# Patient Record
Sex: Male | Born: 1945
Health system: Southern US, Community
[De-identification: ages and names within clinical notes are randomized; demographics above are authoritative.]

## PROBLEM LIST (undated history)

## (undated) DIAGNOSIS — E785 Hyperlipidemia, unspecified: Secondary | ICD-10-CM

## (undated) DIAGNOSIS — E119 Type 2 diabetes mellitus without complications: Secondary | ICD-10-CM

## (undated) DIAGNOSIS — I739 Peripheral vascular disease, unspecified: Secondary | ICD-10-CM

## (undated) DIAGNOSIS — N401 Enlarged prostate with lower urinary tract symptoms: Principal | ICD-10-CM

## (undated) DIAGNOSIS — I10 Essential (primary) hypertension: Secondary | ICD-10-CM

## (undated) DIAGNOSIS — E559 Vitamin D deficiency, unspecified: Secondary | ICD-10-CM

## (undated) DIAGNOSIS — N138 Other obstructive and reflux uropathy: Secondary | ICD-10-CM

## (undated) DIAGNOSIS — Z87442 Personal history of urinary calculi: Secondary | ICD-10-CM

## (undated) DIAGNOSIS — G4733 Obstructive sleep apnea (adult) (pediatric): Secondary | ICD-10-CM

## (undated) DIAGNOSIS — N189 Chronic kidney disease, unspecified: Secondary | ICD-10-CM

## (undated) DIAGNOSIS — N200 Calculus of kidney: Secondary | ICD-10-CM

## (undated) HISTORY — PX: COLONOSCOPY: SHX174

## (undated) HISTORY — DX: Type 2 diabetes mellitus without complications: E11.9

## (undated) HISTORY — PX: NECK SURGERY: SHX720

## (undated) HISTORY — DX: Other obstructive and reflux uropathy: N13.8

## (undated) HISTORY — DX: Benign prostatic hyperplasia with lower urinary tract symptoms: N40.1

## (undated) HISTORY — DX: Hyperlipidemia, unspecified: E78.5

## (undated) HISTORY — DX: Calculus of kidney: N20.0

## (undated) HISTORY — DX: Peripheral vascular disease, unspecified: I73.9

## (undated) HISTORY — DX: Vitamin D deficiency, unspecified: E55.9

## (undated) HISTORY — DX: Obstructive sleep apnea (adult) (pediatric): G47.33

## (undated) HISTORY — PX: DG ARTHRO THUMB*L*: HXRAD205

## (undated) HISTORY — PX: VASECTOMY: SHX75

---

## 2000-05-20 ENCOUNTER — Encounter: Payer: Self-pay | Admitting: Emergency Medicine

## 2000-05-20 ENCOUNTER — Emergency Department (HOSPITAL_COMMUNITY): Admission: EM | Admit: 2000-05-20 | Discharge: 2000-05-20 | Payer: Self-pay | Admitting: Emergency Medicine

## 2000-05-24 ENCOUNTER — Encounter: Payer: Self-pay | Admitting: *Deleted

## 2000-05-24 ENCOUNTER — Encounter: Admission: RE | Admit: 2000-05-24 | Discharge: 2000-05-24 | Payer: Self-pay | Admitting: *Deleted

## 2002-10-07 ENCOUNTER — Encounter: Admission: RE | Admit: 2002-10-07 | Discharge: 2002-10-07 | Payer: Self-pay | Admitting: Family Medicine

## 2002-10-07 ENCOUNTER — Encounter: Payer: Self-pay | Admitting: Family Medicine

## 2002-10-21 ENCOUNTER — Encounter: Admission: RE | Admit: 2002-10-21 | Discharge: 2003-01-19 | Payer: Self-pay | Admitting: Family Medicine

## 2002-10-27 ENCOUNTER — Encounter: Admission: RE | Admit: 2002-10-27 | Discharge: 2002-10-27 | Payer: Self-pay | Admitting: Family Medicine

## 2002-10-27 ENCOUNTER — Encounter: Payer: Self-pay | Admitting: Family Medicine

## 2002-12-26 ENCOUNTER — Encounter: Payer: Self-pay | Admitting: Neurosurgery

## 2002-12-31 ENCOUNTER — Encounter: Payer: Self-pay | Admitting: Neurosurgery

## 2002-12-31 ENCOUNTER — Ambulatory Visit (HOSPITAL_COMMUNITY): Admission: RE | Admit: 2002-12-31 | Discharge: 2003-01-01 | Payer: Self-pay | Admitting: Neurosurgery

## 2003-02-11 ENCOUNTER — Encounter: Payer: Self-pay | Admitting: Neurosurgery

## 2003-02-11 ENCOUNTER — Encounter: Admission: RE | Admit: 2003-02-11 | Discharge: 2003-02-11 | Payer: Self-pay | Admitting: Neurosurgery

## 2003-02-25 ENCOUNTER — Encounter: Admission: RE | Admit: 2003-02-25 | Discharge: 2003-05-26 | Payer: Self-pay | Admitting: Family Medicine

## 2003-05-02 ENCOUNTER — Ambulatory Visit (HOSPITAL_COMMUNITY): Admission: RE | Admit: 2003-05-02 | Discharge: 2003-05-02 | Payer: Self-pay | Admitting: Gastroenterology

## 2003-05-29 ENCOUNTER — Encounter: Admission: RE | Admit: 2003-05-29 | Discharge: 2003-05-29 | Payer: Self-pay | Admitting: Neurosurgery

## 2003-05-29 ENCOUNTER — Encounter: Payer: Self-pay | Admitting: Neurosurgery

## 2003-12-04 ENCOUNTER — Encounter: Admission: RE | Admit: 2003-12-04 | Discharge: 2003-12-04 | Payer: Self-pay | Admitting: Neurosurgery

## 2004-03-09 ENCOUNTER — Encounter (HOSPITAL_BASED_OUTPATIENT_CLINIC_OR_DEPARTMENT_OTHER): Admission: RE | Admit: 2004-03-09 | Discharge: 2004-06-07 | Payer: Self-pay | Admitting: Internal Medicine

## 2011-10-21 ENCOUNTER — Ambulatory Visit: Payer: Self-pay | Admitting: Internal Medicine

## 2011-11-18 ENCOUNTER — Ambulatory Visit: Payer: Self-pay | Admitting: Internal Medicine

## 2013-02-01 ENCOUNTER — Encounter: Payer: Self-pay | Admitting: Family Medicine

## 2013-02-01 ENCOUNTER — Ambulatory Visit (INDEPENDENT_AMBULATORY_CARE_PROVIDER_SITE_OTHER): Payer: Self-pay | Admitting: Family Medicine

## 2013-02-01 VITALS — BP 122/77 | HR 69 | Temp 98.4°F | Resp 18 | Ht 61.0 in | Wt 133.0 lb

## 2013-02-01 DIAGNOSIS — E785 Hyperlipidemia, unspecified: Secondary | ICD-10-CM | POA: Insufficient documentation

## 2013-02-01 DIAGNOSIS — E559 Vitamin D deficiency, unspecified: Secondary | ICD-10-CM | POA: Insufficient documentation

## 2013-02-01 DIAGNOSIS — I10 Essential (primary) hypertension: Secondary | ICD-10-CM | POA: Insufficient documentation

## 2013-02-01 DIAGNOSIS — K047 Periapical abscess without sinus: Secondary | ICD-10-CM

## 2013-02-01 DIAGNOSIS — K044 Acute apical periodontitis of pulpal origin: Secondary | ICD-10-CM

## 2013-02-01 LAB — CBC WITH DIFFERENTIAL/PLATELET
Basophils Absolute: 0 10*3/uL (ref 0.0–0.1)
Basophils Relative: 0 % (ref 0–1)
Hemoglobin: 14.1 g/dL (ref 13.0–17.0)
MCHC: 33.3 g/dL (ref 30.0–36.0)
Neutrophils Relative %: 66 % (ref 43–77)
RBC: 4.96 MIL/uL (ref 4.22–5.81)

## 2013-02-01 MED ORDER — CLINDAMYCIN HCL 300 MG PO CAPS: 300.0000 mg | ORAL_CAPSULE | Freq: Three times a day (TID) | ORAL | Status: DC

## 2013-02-01 MED ORDER — CEFTRIAXONE SODIUM 1 G IJ SOLR
1.0000 g | Freq: Once | INTRAMUSCULAR | Status: AC
  Administered 2013-02-01: 1 g via INTRAMUSCULAR

## 2013-02-01 MED ORDER — AMOXICILLIN-POT CLAVULANATE 500-125 MG PO TABS: 1.0000 | ORAL_TABLET | Freq: Three times a day (TID) | ORAL | Status: DC

## 2013-02-01 NOTE — Progress Notes (Signed)
CC: Michael Thomas is a 67 y.o. male is here for Establish Care and Fatigue   Subjective: HPI:  Very pleasant 67 year old here to establish care accompanied by his wife  Patient complains of 10 days of worsening subjective fevers, chills, and pain. The pain is localized in the anterior and posterior neck moderate in severity it radiates up into his face and the back of his head, it is worse with swallowing. Nothing else particularly makes it better or worse. This all started one day after a tooth was pulled in [place]. He has been taking amoxicillin for approximately 5 days without any improvement of his symptoms. He was given meloxicam for me urgent care provider earlier this week but no improvement of symptoms. He feels fatigued has decreased appetite.  Symptoms are present on a daily basis slowly worsening.  He denies skin pain, shortness of breath, cough, wheezing, dysuria, constipation or diarrhea. He reports an occasional discomfort that is mild low in his abdomen that he's had off and on for months but otherwise is without any other complaints. He has had decreased appetite but denies nausea.  Review of Systems - General ROS: negative for - weight gain or weight loss Ophthalmic ROS: negative for - decreased vision Psychological ROS: negative for - anxiety or depression ENT ROS: negative for - hearing change, nasal congestion, tinnitus or allergies Hematological and Lymphatic ROS: negative for - bleeding problems, bruising or swollen lymph nodes Breast ROS: negative Respiratory ROS: no cough, shortness of breath, or wheezing Cardiovascular ROS: no chest pain or dyspnea on exertion Gastrointestinal ROS: no abdominal pain, change in bowel habits, or black or bloody stools Genito-Urinary ROS: negative for - genital discharge, genital ulcers, incontinence or abnormal bleeding from genitals Musculoskeletal ROS: negative for - joint pain or muscle pain other than that above Neurological ROS:  negative for - headaches or memory loss Dermatological ROS: negative for lumps, mole changes, rash and skin lesion changes  Past Medical History  Diagnosis Date  . Diabetes mellitus without complication   . Hyperlipidemia      History reviewed. No pertinent family history.   History  Substance Use Topics  . Smoking status: Never Smoker   . Smokeless tobacco: Not on file  . Alcohol Use: No     Objective: Filed Vitals:   02/01/13 1113  BP: 122/77  Pulse: 69  Temp: 98.4 F (36.9 C)  Resp: 18    General: Alert and Oriented, No Acute Distress HEENT: Pupils equal, round, reactive to light. Conjunctivae clear.  External ears unremarkable, canals clear with intact TMs with appropriate landmarks.  Middle ear appears open without effusion. Pink inferior turbinates.  Moist mucous membranes, pharynx without inflammation nor lesions.  Neck with shotty anterior bilateral cervical lymphadenopathy. Adjacent to the right upper canine tooth there is a missing tooth with a base that has mild purulence and slight tenderness to the touch Lungs: Clear to auscultation bilaterally, no wheezing/ronchi/rales.  Comfortable work of breathing. Good air movement. Cardiac: Regular rate and rhythm. Normal S1/S2.  No murmurs, rubs, nor gallops.   Abdomen: Normal bowel sounds, no palpable masses, mild right lower quadrant pain without rebound negative psoas negative heel strike. Extremities: No peripheral edema.  Strong peripheral pulses.  Mental Status: No depression, anxiety, nor agitation. Skin: Warm and dry.  Assessment & Plan: [place] was seen today for establish care and fatigue.  Diagnoses and associated orders for this visit:  Dental infection - clindamycin (CLEOCIN) 300 MG capsule; Take 1 capsule (300 mg  total) by mouth 3 (three) times daily. - amoxicillin-clavulanate (AUGMENTIN) 500-125 MG per tablet; Take 1 tablet (500 mg total) by mouth 3 (three) times daily. - CBC w/Diff - cefTRIAXone  (ROCEPHIN) injection 1 g; Inject 1 g into the muscle once.  Other Orders - meloxicam (MOBIC) 15 MG tablet; Take 15 mg by mouth daily. - metFORMIN (GLUCOPHAGE) 500 MG tablet; Take 500 mg by mouth 3 (three) times daily. - lisinopril (PRINIVIL,ZESTRIL) 10 MG tablet; Take 10 mg by mouth daily. - simvastatin (ZOCOR) 40 MG tablet; Take 40 mg by mouth every evening. - Magnesium 400 MG CAPS; Take by mouth. - aspirin 81 MG tablet; Take 81 mg by mouth daily. - fish oil-omega-3 fatty acids 1000 MG capsule; Take 2 g by mouth daily. - cholecalciferol (VITAMIN D) 1000 UNITS tablet; Take 1,000 Units by mouth daily.    Discussed with patient and wife that I believe his symptoms are coming from a dental infection at the site of his tooth that was removed, I would like to better cover oral bacteria with starting Augmentin since he has been on amoxicillin I also like him to start clindamycin in case of increased resistance to amoxicillin. He received a gram of ceftriaxone in hopes that this will speed up the recovery process. Discussed that there is a chance that his fevers could be due to appendicitis however he would not like to investigate this citing that his right lower quadrant pain is subjectively some mild. I would like to get a white count as a baseline should symptoms be lingering on Monday. Signs and symptoms requring emergent/urgent reevaluation were discussed with the patient.   Return if symptoms worsen or fail to improve.

## 2013-02-18 ENCOUNTER — Ambulatory Visit: Payer: Self-pay | Admitting: Family Medicine

## 2013-04-11 ENCOUNTER — Ambulatory Visit: Payer: Self-pay | Admitting: Unknown Physician Specialty

## 2013-06-07 ENCOUNTER — Ambulatory Visit: Payer: Self-pay | Admitting: Gastroenterology

## 2014-01-24 DIAGNOSIS — E1129 Type 2 diabetes mellitus with other diabetic kidney complication: Secondary | ICD-10-CM | POA: Insufficient documentation

## 2014-01-24 DIAGNOSIS — G4733 Obstructive sleep apnea (adult) (pediatric): Secondary | ICD-10-CM | POA: Insufficient documentation

## 2014-01-24 DIAGNOSIS — E1165 Type 2 diabetes mellitus with hyperglycemia: Secondary | ICD-10-CM | POA: Insufficient documentation

## 2014-01-24 DIAGNOSIS — E1122 Type 2 diabetes mellitus with diabetic chronic kidney disease: Secondary | ICD-10-CM | POA: Insufficient documentation

## 2014-01-24 DIAGNOSIS — E119 Type 2 diabetes mellitus without complications: Secondary | ICD-10-CM | POA: Insufficient documentation

## 2014-01-24 DIAGNOSIS — E1151 Type 2 diabetes mellitus with diabetic peripheral angiopathy without gangrene: Secondary | ICD-10-CM | POA: Insufficient documentation

## 2014-06-13 ENCOUNTER — Emergency Department: Payer: Self-pay | Admitting: Emergency Medicine

## 2014-10-28 ENCOUNTER — Ambulatory Visit: Payer: Self-pay | Admitting: Family Medicine

## 2015-03-25 DIAGNOSIS — N138 Other obstructive and reflux uropathy: Secondary | ICD-10-CM | POA: Insufficient documentation

## 2015-03-25 DIAGNOSIS — N400 Enlarged prostate without lower urinary tract symptoms: Secondary | ICD-10-CM | POA: Insufficient documentation

## 2015-03-25 DIAGNOSIS — N401 Enlarged prostate with lower urinary tract symptoms: Secondary | ICD-10-CM

## 2015-03-25 HISTORY — DX: Other obstructive and reflux uropathy: N13.8

## 2015-04-03 ENCOUNTER — Encounter: Payer: Self-pay | Admitting: *Deleted

## 2015-04-14 ENCOUNTER — Ambulatory Visit: Payer: Self-pay

## 2015-04-24 ENCOUNTER — Ambulatory Visit: Payer: Self-pay

## 2015-04-30 ENCOUNTER — Ambulatory Visit (INDEPENDENT_AMBULATORY_CARE_PROVIDER_SITE_OTHER): Payer: PPO | Admitting: Urology

## 2015-04-30 VITALS — BP 137/76 | HR 66 | Ht 61.0 in | Wt 136.4 lb

## 2015-04-30 DIAGNOSIS — N401 Enlarged prostate with lower urinary tract symptoms: Secondary | ICD-10-CM

## 2015-04-30 DIAGNOSIS — N138 Other obstructive and reflux uropathy: Secondary | ICD-10-CM

## 2015-04-30 LAB — MICROSCOPIC EXAMINATION
RBC, UA: NONE SEEN /hpf (ref 0–?)
Renal Epithel, UA: NONE SEEN /hpf

## 2015-04-30 LAB — URINALYSIS, COMPLETE
Bilirubin, UA: NEGATIVE
GLUCOSE, UA: NEGATIVE
KETONES UA: NEGATIVE
Leukocytes, UA: NEGATIVE
NITRITE UA: NEGATIVE
RBC UA: NEGATIVE
SPEC GRAV UA: 1.025 (ref 1.005–1.030)
UUROB: 0.2 mg/dL (ref 0.2–1.0)
pH, UA: 5.5 (ref 5.0–7.5)

## 2015-04-30 LAB — BLADDER SCAN AMB NON-IMAGING: SCAN RESULT: 62

## 2015-04-30 MED ORDER — TAMSULOSIN HCL 0.4 MG PO CAPS: 0.4000 mg | ORAL_CAPSULE | Freq: Every day | ORAL | Status: AC

## 2015-04-30 MED ORDER — TAMSULOSIN HCL 0.4 MG PO CAPS: 0.4000 mg | ORAL_CAPSULE | Freq: Every day | ORAL | Status: DC

## 2015-04-30 NOTE — Progress Notes (Signed)
69 year old male who is referred by Dr. Lisette Grinder, M.D. for evaluation and management of progressive lower urinary tract symptoms. The patient states that his symptoms have been progressive over the past 8-10 months. Predominantly, the patient complains of urinary frequency and urgency. The patient does not have a history of urinary incontinence. He feels as if he empties his bladder completely most of the time. He does not have to strain to void, he denies a weak stream. He gets up on average less than 1 time per night. Occasionally, he will get up twice at night. The patient has no family history of prostate cancer. He has had routine rectal exams although he is unsure of his PSA results. The patient has no history of recurrent urinary tract infections. He denies any flank pain or suprapubic pain. He denies any dysuria. I PSS: 11, QoL3 PVR: 58 mL A comprehensive review of systems was obtained and is negative with the exception of sinus problems area did  The patient takes metformin but has well-controlled diabetes. He also has a history of hypercholesterolemia.  Past Medical History  Diagnosis Date   Diabetes mellitus without complication    Hyperlipidemia    Kidney stone    OSA (obstructive sleep apnea)    Vitamin D deficiency    Benign prostatic hyperplasia with urinary obstruction 03/25/2015   Past Surgical History  Procedure Laterality Date   Neck surgery     Dg arthro thumb*l*     Current Outpatient Prescriptions on File Prior to Visit  Medication Sig Dispense Refill   lisinopril (PRINIVIL,ZESTRIL) 10 MG tablet Take 10 mg by mouth daily.     metFORMIN (GLUCOPHAGE) 500 MG tablet Take 500 mg by mouth 3 (three) times daily.     simvastatin (ZOCOR) 40 MG tablet Take 40 mg by mouth every evening.     amoxicillin-clavulanate (AUGMENTIN) 500-125 MG per tablet Take 1 tablet (500 mg total) by mouth 3 (three) times daily. (Patient not taking: Reported on 04/30/2015) 30 tablet 0    cholecalciferol (VITAMIN D) 1000 UNITS tablet Take 1,000 Units by mouth daily.     clindamycin (CLEOCIN) 300 MG capsule Take 1 capsule (300 mg total) by mouth 3 (three) times daily. (Patient not taking: Reported on 04/30/2015) 30 capsule 0   fish oil-omega-3 fatty acids 1000 MG capsule Take 2 g by mouth daily.     Magnesium 400 MG CAPS Take by mouth.     meloxicam (MOBIC) 15 MG tablet Take 15 mg by mouth daily.     No current facility-administered medications on file prior to visit.   PE: NAD Filed Vitals:   04/30/15 1431  BP: 137/76  Pulse: 66   Mucosal membranes are moist Head is normocephalic, hearing normal nonlabored breathing Patient's extremities are well-perfused, no peripheral edema Her abdomen is flat There is no CVA tenderness There are no skin lesions or rashes  Rectal exam reveals a prostate that is +1, smooth, symmetric Mood is appropriate, affect is bright No appreciable lymphadenopathy Gait is normal, no gross neurological deficits  Bladder Scan Patient  Void: 58 ml Performed By: [facility]  Urinalysis today is normal  PSA, June 2015: 3.01  Impression: The patient has an enlarged prostate with obstructive voiding symptoms. His PSA was mildly elevated last year, his rectal exam is reassuring. Recommendations: I recommended that the patient continued to take tamsulosin as he is been prescribed. This seems to make his symptoms better. We will check a PSA today to ensure that the PSA  is not rising too fast. We'll plan to follow up with the patient in 1 year to reevaluate the patient's voiding symptoms at that time.  Cc: Dr. Lisette Grinder, M.D.

## 2015-05-01 LAB — PSA TOTAL (REFLEX TO FREE): Prostate Specific Ag, Serum: 2.7 ng/mL (ref 0.0–4.0)

## 2015-05-06 ENCOUNTER — Telehealth: Payer: Self-pay

## 2015-05-06 DIAGNOSIS — R972 Elevated prostate specific antigen [PSA]: Secondary | ICD-10-CM

## 2015-05-06 NOTE — Telephone Encounter (Signed)
-----   Message from Ardis Hughs, MD sent at 05/06/2015  8:24 AM EDT ----- Please inform patient of his PSA result, this is stable from the year prior and doesn't need to be checked again until next year.

## 2015-05-06 NOTE — Telephone Encounter (Signed)
Spoke pt in reference to PSA results. Pt voiced understanding. Pt made a lab appt for 04/28/16 for PSA prior to appt. PSA orders placed.

## 2015-09-23 DIAGNOSIS — E119 Type 2 diabetes mellitus without complications: Secondary | ICD-10-CM | POA: Diagnosis not present

## 2015-09-30 DIAGNOSIS — I10 Essential (primary) hypertension: Secondary | ICD-10-CM | POA: Diagnosis not present

## 2015-09-30 DIAGNOSIS — E119 Type 2 diabetes mellitus without complications: Secondary | ICD-10-CM | POA: Diagnosis not present

## 2015-11-17 ENCOUNTER — Ambulatory Visit (INDEPENDENT_AMBULATORY_CARE_PROVIDER_SITE_OTHER): Payer: PPO | Admitting: Family Medicine

## 2015-11-17 ENCOUNTER — Encounter: Payer: Self-pay | Admitting: Family Medicine

## 2015-11-17 VITALS — BP 128/81 | HR 90 | Wt 134.0 lb

## 2015-11-17 DIAGNOSIS — J302 Other seasonal allergic rhinitis: Secondary | ICD-10-CM | POA: Diagnosis not present

## 2015-11-17 DIAGNOSIS — R252 Cramp and spasm: Secondary | ICD-10-CM

## 2015-11-17 NOTE — Progress Notes (Signed)
CC: Michael Thomas is a 70 y.o. male is here for URI   Subjective: HPI:  Very pleasant 70 year old here to reestablish care   2 days ago he began to get subjective fevers and chills with worsening of muscle cramps. He tells me that for matter of months he's had some mild cramping in the legs and arms only at rest however 2 days ago symptoms have gotten worse and are involving the feet, hands, upper and lower extremities along with the abdominal wall. It never occurs with activity and only occurs when resting such as sleeping or sitting. He's also noticed for the past 2 days that he feels weaker in his lower extremities. He denies any known symptoms of illness such as shortness of breath, wheezing, cough, abdominal pain, diarrhea or constipation. Denies any rash. He recently had a tooth abscess and is currently on penicillin following a dental procedure last week. He denies any jaw or dental pain. Denies any pain in the face or head.  He tells me he suffers from nasal congestion this time of year annually and would like to know if he can take something for this that's not over-the-counter  Review of Systems - General ROS: negative for -  night sweats, weight gain or weight loss Ophthalmic ROS: negative for - decreased vision Psychological ROS: negative for - anxiety or depression ENT ROS: negative for - hearing change,  tinnitus or allergies Hematological and Lymphatic ROS: negative for - bleeding problems, bruising or swollen lymph nodes Breast ROS: negative Respiratory ROS: no cough, shortness of breath, or wheezing Cardiovascular ROS: no chest pain or dyspnea on exertion Gastrointestinal ROS: no abdominal pain, change in bowel habits, or black or bloody stools Genito-Urinary ROS: negative for - genital discharge, genital ulcers, incontinence or abnormal bleeding from genitals Musculoskeletal ROS: negative for - joint pain Neurological ROS: negative for - headaches or memory  loss Dermatological ROS: negative for lumps, mole changes, rash and skin lesion changes  Past Medical History  Diagnosis Date  . Diabetes mellitus without complication   . Hyperlipidemia   . Kidney stone   . OSA (obstructive sleep apnea)   . Vitamin D deficiency   . Benign prostatic hyperplasia with urinary obstruction 03/25/2015    Past Surgical History  Procedure Laterality Date  . Neck surgery    . Dg arthro thumb*l*     Family History  Problem Relation Age of Onset  . Stroke Mother   . Diabetes Mellitus II Mother   . Prostate cancer Neg Hx   . Kidney cancer Neg Hx   . Bladder Cancer Neg Hx     Social History   Social History  . Marital Status: Married    Spouse Name: N/A  . Number of Children: N/A  . Years of Education: N/A   Occupational History  . Not on file.   Social History Main Topics  . Smoking status: Never Smoker   . Smokeless tobacco: Not on file  . Alcohol Use: No  . Drug Use: No  . Sexual Activity: Yes    Birth Control/ Protection: None   Other Topics Concern  . Not on file   Social History Narrative     Objective: BP 128/81 mmHg  Pulse 90  Wt 134 lb (60.782 kg)  General: Alert and Oriented, No Acute Distress HEENT: Pupils equal, round, reactive to light. Conjunctivae clear.Moist mucous membranes Lungs: Clear to auscultation bilaterally, no wheezing/ronchi/rales.  Comfortable work of breathing. Good air movement. Cardiac: Regular  rate and rhythm. Normal S1/S2.  No murmurs, rubs, nor gallops.   Extremities: No peripheral edema.  Strong peripheral pulses. Full range of motion and strength in all 4 extremities Mental Status: No depression, anxiety, nor agitation. Skin: Warm and dry.  Assessment & Plan: Michael Thomas was seen today for uri.  Diagnoses and all orders for this visit:  Muscle cramping -     COMPLETE METABOLIC PANEL WITH GFR -     CBC -     TSH  Seasonal allergies   Muscle cramping: Rule out hyperkalemia or other I  abnormality, rule out thyroid abnormality or anemia. Seasonal allergies: His wife was given singulair today for him to try as well and if beneficial in the next few days I be happy to give him a formal prescription as well.  Return if symptoms worsen or fail to improve.

## 2015-11-18 ENCOUNTER — Telehealth: Payer: Self-pay | Admitting: Family Medicine

## 2015-11-18 LAB — CBC
HCT: 43.3 % (ref 38.5–50.0)
Hemoglobin: 15 g/dL (ref 13.2–17.1)
MCH: 29.5 pg (ref 27.0–33.0)
MCHC: 34.6 g/dL (ref 32.0–36.0)
MCV: 85.1 fL (ref 80.0–100.0)
MPV: 9.5 fL (ref 7.5–12.5)
Platelets: 279 10*3/uL (ref 140–400)
RBC: 5.09 MIL/uL (ref 4.20–5.80)
RDW: 14.5 % (ref 11.0–15.0)
WBC: 8.1 10*3/uL (ref 3.8–10.8)

## 2015-11-18 LAB — COMPLETE METABOLIC PANEL WITH GFR
ALT: 15 U/L (ref 9–46)
AST: 18 U/L (ref 10–35)
Albumin: 4.2 g/dL (ref 3.6–5.1)
Alkaline Phosphatase: 46 U/L (ref 40–115)
BUN: 18 mg/dL (ref 7–25)
CO2: 21 mmol/L (ref 20–31)
Calcium: 9.4 mg/dL (ref 8.6–10.3)
Chloride: 98 mmol/L (ref 98–110)
Creat: 1.3 mg/dL — ABNORMAL HIGH (ref 0.70–1.18)
GFR, Est African American: 63 mL/min (ref >=60)
GFR, Est Non African American: 55 mL/min — ABNORMAL LOW (ref >=60)
Glucose, Bld: 150 mg/dL — ABNORMAL HIGH (ref 65–99)
Potassium: 4.5 mmol/L (ref 3.5–5.3)
Sodium: 133 mmol/L — ABNORMAL LOW (ref 135–146)
Total Bilirubin: 0.5 mg/dL (ref 0.2–1.2)
Total Protein: 7 g/dL (ref 6.1–8.1)

## 2015-11-18 LAB — TSH: TSH: 0.81 mIU/L (ref 0.40–4.50)

## 2015-11-18 MED ORDER — MAGNESIUM 300 MG PO CAPS
ORAL_CAPSULE | ORAL | Status: DC
Start: 2015-11-18 — End: 2016-12-05

## 2015-11-18 NOTE — Telephone Encounter (Signed)
Will you please let patient know that his blood work was only significant for an abnormally low sodium level which could be the cause of his muscle cramping. If he's not already taking a 300-400mg  OTC magnesium supplement I'd recommend he start this, he's already taking this I'd recommend he start adding a teaspoon of table salt to his daily diet, this can be spread out over the course of the day if needed.    Also, I recommended that he try taking one of the montelukast tablets I gave his wife yesterday and if he thinks it's helping his allergies please let me know and I can write him a Rx as well.

## 2015-11-18 NOTE — Telephone Encounter (Signed)
Pt.notified

## 2015-11-19 ENCOUNTER — Telehealth: Payer: Self-pay | Admitting: Family Medicine

## 2015-11-19 ENCOUNTER — Encounter: Payer: Self-pay | Admitting: Osteopathic Medicine

## 2015-11-19 ENCOUNTER — Ambulatory Visit (INDEPENDENT_AMBULATORY_CARE_PROVIDER_SITE_OTHER): Payer: PPO | Admitting: Osteopathic Medicine

## 2015-11-19 VITALS — BP 121/84 | HR 82 | Temp 97.5°F | Wt 133.0 lb

## 2015-11-19 DIAGNOSIS — G4489 Other headache syndrome: Secondary | ICD-10-CM | POA: Insufficient documentation

## 2015-11-19 LAB — SEDIMENTATION RATE: SED RATE: 11 mm/h (ref 0–20)

## 2015-11-19 MED ORDER — KETOROLAC TROMETHAMINE 60 MG/2ML IM SOLN
60.0000 mg | Freq: Once | INTRAMUSCULAR | Status: AC
  Administered 2015-11-19: 60 mg via INTRAMUSCULAR

## 2015-11-19 MED ORDER — PREDNISONE 20 MG PO TABS: 20.0000 mg | ORAL_TABLET | Freq: Two times a day (BID) | ORAL | Status: DC

## 2015-11-19 MED ORDER — SUMATRIPTAN SUCCINATE 50 MG PO TABS: 50.0000 mg | ORAL_TABLET | ORAL | Status: DC | PRN

## 2015-11-19 NOTE — Telephone Encounter (Signed)
Called patient to check in on him and relay that ESR test was normal.  Left message on voice mail stating that ESR was normal and if symptoms worsen seek care from closest ER.

## 2015-11-19 NOTE — Progress Notes (Signed)
HPI: Michael Thomas is a 70 y.o. male who presents to [facility] today for chief complaint of:  Chief Complaint  Patient presents with  . Ear Pain  . feeling very weak     . Location: Left ear and left temple, left side of head . Quality: Reports as a sharp shooting pain, constant, every few seconds . Severity: Severe . Duration: About a day . Context: Felt somewhat similar pain in the past when he had a dental infection, teeth are not bothering him at the moment. . Assoc signs/symptoms: Patient reports feeling significant fatigue, muscle weakness, chills and subjective fever. Recently seen in the office for muscle cramping, nothing significant on labs as noted below.   Past medical, social and family history reviewed: Past Medical History  Diagnosis Date  . Diabetes mellitus without complication ([place])   . Hyperlipidemia   . Kidney stone   . OSA (obstructive sleep apnea)   . Vitamin D deficiency   . Benign prostatic hyperplasia with urinary obstruction 03/25/2015   Past Surgical History  Procedure Laterality Date  . Neck surgery    . Dg arthro thumb*l*     Social History  Substance Use Topics  . Smoking status: Never Smoker   . Smokeless tobacco: Not on file  . Alcohol Use: No   Family History  Problem Relation Age of Onset  . Stroke Mother   . Diabetes Mellitus II Mother   . Prostate cancer Neg Hx   . Kidney cancer Neg Hx   . Bladder Cancer Neg Hx     Current Outpatient Prescriptions  Medication Sig Dispense Refill  . aspirin EC 81 MG tablet Take by mouth.    . cholecalciferol (VITAMIN D) 1000 UNITS tablet Take 1,000 Units by mouth daily.    Marland Kitchen lisinopril (PRINIVIL,ZESTRIL) 10 MG tablet Take 10 mg by mouth daily.    . Magnesium 300 MG CAPS One by mouth daily to help prevent muscle cramps.  0  . Magnesium 400 MG CAPS Take by mouth.    . metFORMIN (GLUCOPHAGE) 500 MG tablet Take 500 mg by mouth 3 (three) times daily.    .  simvastatin (ZOCOR) 40 MG tablet Take 40 mg by mouth every evening.    . tamsulosin (FLOMAX) 0.4 MG CAPS capsule Take 1 capsule (0.4 mg total) by mouth daily. 30 capsule 11  . predniSONE (DELTASONE) 20 MG tablet Take 1 tablet (20 mg total) by mouth 2 (two) times daily with a meal. 20 tablet 1  . SUMAtriptan (IMITREX) 50 MG tablet Take 1 tablet (50 mg total) by mouth every 2 (two) hours as needed (migraine/cluster headache). May repeat in 2 hours if headache persists. Maximum of 200 mg/4 pills in a 24-hour period. 30 tablet 0   No current facility-administered medications for this visit.   No Known Allergies    Review of Systems: CONSTITUTIONAL:  No  fever, no chills, No  unintentional weight changes, (+) fatigue and feeling ill HEAD/EYES/EARS/NOSE/THROAT: (+) headache, no vision change, no hearing change, No  sore throat, No  sinus pressure (+) ear pain as per HPI CARDIAC: No  chest pain, No  pressure, No palpitations, RESPIRATORY: No  cough, No  shortness of breath/wheeze GASTROINTESTINAL: No  nausea, No  vomiting, No  abdominal pain, MUSCULOSKELETAL: (+) generalized myalgia/arthralgia SKIN: No  rash/wounds/concerning lesions NEUROLOGIC: No  weakness, No  dizziness, No  slurred speech   Exam:  BP 121/84 mmHg  Pulse 82  Temp(Src) 97.5  F (36.4 C) (Oral)  Wt 133 lb (60.328 kg)  SpO2 97% Constitutional: VS see above. General Appearance: alert, well-developed, well-nourished, NAD Eyes: Normal lids and conjunctive, non-icteric sclera, EOMI Head, Ears, Nose, Mouth, Throat: MMM, Normal external inspection ears/nares/mouth/lips/gums, TM normal bilaterally. Pharynx no erythema, no exudate. Temporal area and superior and posterior to the left ear is quite tender, no rash over the area, no temporal artery bruit Neck: No masses, trachea midline. No thyroid enlargement/tenderness/mass appreciated. No lymphadenopathy Respiratory: Normal respiratory effort. no wheeze, no rhonchi, no  rales Cardiovascular: S1/S2 normal, no murmur, no rub/gallop auscultated. RRR.  Musculoskeletal: Gait normal. No clubbing/cyanosis of digits.  Neurological: No cranial nerve deficit on limited exam. Motor and sensation intact and symmetric Skin: warm, dry, intact. No rash/ulcer.   Results for orders placed or performed in visit on 11/17/15 (from the past 72 hour(s))  COMPLETE METABOLIC PANEL WITH GFR     Status: Abnormal   Collection Time: 11/17/15  2:29 PM  Result Value Ref Range   Sodium 133 (L) 135 - 146 mmol/L   Potassium 4.5 3.5 - 5.3 mmol/L   Chloride 98 98 - 110 mmol/L   CO2 21 20 - 31 mmol/L   Glucose, Bld 150 (H) 65 - 99 mg/dL   BUN 18 7 - 25 mg/dL   Creat 1.30 (H) 0.70 - 1.18 mg/dL   Total Bilirubin 0.5 0.2 - 1.2 mg/dL   Alkaline Phosphatase 46 40 - 115 U/L   AST 18 10 - 35 U/L   ALT 15 9 - 46 U/L   Total Protein 7.0 6.1 - 8.1 g/dL   Albumin 4.2 3.6 - 5.1 g/dL   Calcium 9.4 8.6 - 10.3 mg/dL   GFR, Est African American 63 >=60 mL/min   GFR, Est Non African American 55 (L) >=60 mL/min    Comment:   The estimated GFR is a calculation valid for adults (>=55 years old) that uses the CKD-EPI algorithm to adjust for age and sex. It is   not to be used for children, pregnant women, hospitalized patients,    patients on dialysis, or with rapidly changing kidney function. According to the NKDEP, eGFR >89 is normal, 60-89 shows mild impairment, 30-59 shows moderate impairment, 15-29 shows severe impairment and <15 is ESRD.     CBC     Status: None   Collection Time: 11/17/15  2:29 PM  Result Value Ref Range   WBC 8.1 3.8 - 10.8 K/uL   RBC 5.09 4.20 - 5.80 MIL/uL   Hemoglobin 15.0 13.2 - 17.1 g/dL   HCT 43.3 38.5 - 50.0 %   MCV 85.1 80.0 - 100.0 fL   MCH 29.5 27.0 - 33.0 pg   MCHC 34.6 32.0 - 36.0 g/dL   RDW 14.5 11.0 - 15.0 %   Platelets 279 140 - 400 K/uL   MPV 9.5 7.5 - 12.5 fL    Comment: ** Please note change in unit of measure and reference range(s). **  TSH      Status: None   Collection Time: 11/17/15  2:29 PM  Result Value Ref Range   TSH 0.81 0.40 - 4.50 mIU/L      ASSESSMENT/PLAN: Concern for temporal arteritis, as noted below. Stat sedimentation rate ordered. If elevated, will call patient and have him proceed to the nearest emergency room for full evaluation. Patient advised that temporal arteritis may not be the cause of his symptoms, however in his best interest since this is a dangerous diagnosis would  need to rule this out. Would also consider cluster headache versus deep dental infection, dental exam appears normal today. Sumatriptan given to take when necessary headache for consideration of cluster headache. Steroids started. Patient advised to follow up next week with PCP, do not stop steroids until he is told to do so by his physician. ER precautions reviewed and patient verbalizes understanding. See patient printed information. Signout given to the on-call physician.  Other headache syndrome - Possible temporal arteritis given location, systemic symptoms. Stat ESR, if positive to ER/MRI. Prednisone started. Possible dental infection, cluster headache - Plan: predniSONE (DELTASONE) 20 MG tablet, Sed Rate (ESR), SUMAtriptan (IMITREX) 50 MG tablet, ketorolac (TORADOL) injection 60 mg   Return in about 1 week (around 11/26/2015), or if symptoms worsen or fail to improve, for FOLLOWUP WITH DR HOMMEL.

## 2015-11-19 NOTE — Patient Instructions (Signed)
Given your symptoms of pain in the ear/side of the head, as well as complaints of fatigue, chills and feverish feeling, muscle complaints, we need to rule out a dangerous condition called temporal arteritis. This is an inflammatory condition that can affect the temporal artery, in worse case scenario can result in vision loss and other brain problems, so when we are suspicious of this illness we start steroid therapy right away and get lab work done. If your labs are positive for a high level of inflammatory marker, you will get a call from [place] and you should go to the emergency room for complete evaluation, which often will involve imaging of the brain.   Other causes of your symptoms can include cluster headaches, for which I have sent a medication to take as needed. This could also be due to something like a dental infection. The ear itself does not appear to be infected and that would not usually cause sharp pain for affects anywhere else in the head. I would recommend that you schedule a follow-up visit with Dr. Ileene Rubens for sometime next week, if your symptoms get worse or you have any other concerning problems, he'll need to go to the emergency room right away. Be sure to be taking the steroids as directed, do not stop these without first seeing Dr. Ileene Rubens in talking to him more.

## 2016-01-05 DIAGNOSIS — E119 Type 2 diabetes mellitus without complications: Secondary | ICD-10-CM | POA: Diagnosis not present

## 2016-01-11 DIAGNOSIS — E119 Type 2 diabetes mellitus without complications: Secondary | ICD-10-CM | POA: Diagnosis not present

## 2016-01-11 DIAGNOSIS — G4733 Obstructive sleep apnea (adult) (pediatric): Secondary | ICD-10-CM | POA: Diagnosis not present

## 2016-01-11 DIAGNOSIS — I10 Essential (primary) hypertension: Secondary | ICD-10-CM | POA: Diagnosis not present

## 2016-01-11 DIAGNOSIS — Z125 Encounter for screening for malignant neoplasm of prostate: Secondary | ICD-10-CM | POA: Diagnosis not present

## 2016-01-11 DIAGNOSIS — R040 Epistaxis: Secondary | ICD-10-CM | POA: Diagnosis not present

## 2016-01-27 DIAGNOSIS — R040 Epistaxis: Secondary | ICD-10-CM | POA: Diagnosis not present

## 2016-01-27 DIAGNOSIS — J340 Abscess, furuncle and carbuncle of nose: Secondary | ICD-10-CM | POA: Diagnosis not present

## 2016-01-27 DIAGNOSIS — E119 Type 2 diabetes mellitus without complications: Secondary | ICD-10-CM | POA: Diagnosis not present

## 2016-04-06 DIAGNOSIS — Z125 Encounter for screening for malignant neoplasm of prostate: Secondary | ICD-10-CM | POA: Diagnosis not present

## 2016-04-06 DIAGNOSIS — E119 Type 2 diabetes mellitus without complications: Secondary | ICD-10-CM | POA: Diagnosis not present

## 2016-04-13 DIAGNOSIS — Z0001 Encounter for general adult medical examination with abnormal findings: Secondary | ICD-10-CM | POA: Diagnosis not present

## 2016-04-13 DIAGNOSIS — I10 Essential (primary) hypertension: Secondary | ICD-10-CM | POA: Diagnosis not present

## 2016-04-13 DIAGNOSIS — G4733 Obstructive sleep apnea (adult) (pediatric): Secondary | ICD-10-CM | POA: Diagnosis not present

## 2016-04-13 DIAGNOSIS — E119 Type 2 diabetes mellitus without complications: Secondary | ICD-10-CM | POA: Diagnosis not present

## 2016-04-27 ENCOUNTER — Other Ambulatory Visit: Payer: Self-pay

## 2016-04-27 DIAGNOSIS — N400 Enlarged prostate without lower urinary tract symptoms: Secondary | ICD-10-CM

## 2016-04-28 ENCOUNTER — Other Ambulatory Visit: Payer: PPO

## 2016-04-28 DIAGNOSIS — N400 Enlarged prostate without lower urinary tract symptoms: Secondary | ICD-10-CM

## 2016-04-29 ENCOUNTER — Telehealth: Payer: Self-pay

## 2016-04-29 LAB — PSA: Prostate Specific Ag, Serum: 2.1 ng/mL (ref 0.0–4.0)

## 2016-04-29 NOTE — Telephone Encounter (Signed)
Spoke with pt in reference to PSA results. Pt voiced understanding.  

## 2016-04-29 NOTE — Telephone Encounter (Signed)
LMOM

## 2016-04-29 NOTE — Telephone Encounter (Signed)
-----   Message from Ardis Hughs, MD sent at 04/29/2016 10:12 AM EDT ----- Please let patient know that his PSA is stable.

## 2016-05-03 ENCOUNTER — Encounter: Payer: Self-pay | Admitting: Urology

## 2016-05-03 ENCOUNTER — Ambulatory Visit (INDEPENDENT_AMBULATORY_CARE_PROVIDER_SITE_OTHER): Payer: PPO | Admitting: Urology

## 2016-05-03 VITALS — BP 146/76 | HR 79 | Ht 61.0 in | Wt 137.0 lb

## 2016-05-03 DIAGNOSIS — N138 Other obstructive and reflux uropathy: Secondary | ICD-10-CM

## 2016-05-03 DIAGNOSIS — N401 Enlarged prostate with lower urinary tract symptoms: Secondary | ICD-10-CM | POA: Diagnosis not present

## 2016-05-03 DIAGNOSIS — Z125 Encounter for screening for malignant neoplasm of prostate: Secondary | ICD-10-CM | POA: Diagnosis not present

## 2016-05-03 LAB — BLADDER SCAN AMB NON-IMAGING: Scan Result: 61

## 2016-05-03 MED ORDER — FINASTERIDE 5 MG PO TABS: 5.0000 mg | ORAL_TABLET | Freq: Every day | ORAL | 3 refills | Status: AC

## 2016-05-03 MED ORDER — TAMSULOSIN HCL 0.4 MG PO CAPS: 0.4000 mg | ORAL_CAPSULE | Freq: Every day | ORAL | 3 refills | Status: AC

## 2016-05-03 NOTE — Progress Notes (Signed)
05/03/2016 7:00 AM   Michael Thomas 09-15-1945 [id number]  Referring provider: Marcial Pacas, DO No address on file  No chief complaint on file.   HPI:  1- Lower Urinary Tract Symptoms - on tamsulosin daily for moderate bother from mix of obstructive and irritative symptoms. PVR 2016 "33mL" (normal). He is diabetic but no LE neuropathy.   2 - Prostate Screening -  2016 - PSA 2.7 2017 - PSA 2.1 / DRE 45 gm at age 70 ---> no further PSA based screening.  Today " Michael Thomas " is seen in f/u above.  He is still bothored by irritative symptoms, mostly urgency / freqeuncy. PVR today56mL.   PMH: Past Medical History:  Diagnosis Date  . Benign prostatic hyperplasia with urinary obstruction 03/25/2015  . Diabetes mellitus without complication ([place])   . Hyperlipidemia   . Kidney stone   . OSA (obstructive sleep apnea)   . Vitamin D deficiency     Surgical History: Past Surgical History:  Procedure Laterality Date  . DG ARTHRO THUMB*L*    . NECK SURGERY      Home Medications:    Medication List       Accurate as of 05/03/16  7:00 AM. Always use your most recent med list.          aspirin EC 81 MG tablet Take by mouth.   cholecalciferol 1000 units tablet Commonly known as:  VITAMIN D Take 1,000 Units by mouth daily.   lisinopril 10 MG tablet Commonly known as:  PRINIVIL,ZESTRIL Take 10 mg by mouth daily.   Magnesium 300 MG Caps One by mouth daily to help prevent muscle cramps.   Magnesium 400 MG Caps Take by mouth.   metFORMIN 500 MG tablet Commonly known as:  GLUCOPHAGE Take 500 mg by mouth 3 (three) times daily.   predniSONE 20 MG tablet Commonly known as:  DELTASONE Take 1 tablet (20 mg total) by mouth 2 (two) times daily with a meal.   simvastatin 40 MG tablet Commonly known as:  ZOCOR Take 40 mg by mouth every evening.   SUMAtriptan 50 MG tablet Commonly known as:  IMITREX Take 1 tablet (50 mg total) by mouth every 2 (two) hours as needed  (migraine/cluster headache). May repeat in 2 hours if headache persists. Maximum of 200 mg/4 pills in a 24-hour period.       Allergies: No Known Allergies  Family History: Family History  Problem Relation Age of Onset  . Stroke Mother   . Diabetes Mellitus II Mother   . Prostate cancer Neg Hx   . Kidney cancer Neg Hx   . Bladder Cancer Neg Hx     Social History:  reports that he has never smoked. He does not have any smokeless tobacco history on file. He reports that he does not drink alcohol or use drugs.    Review of Systems  Gastrointestinal (upper)  : Negative for upper GI symptoms  Gastrointestinal (lower) : Negative for lower GI symptoms  Constitutional : Negative for symptoms  Skin: Negative for skin symptoms  Eyes: Negative for eye symptoms  Ear/Nose/Throat : Negative for Ear/Nose/Throat symptoms  Hematologic/Lymphatic: Negative for Hematologic/Lymphatic symptoms  Cardiovascular : Negative for cardiovascular symptoms  Respiratory : Negative for respiratory symptoms  Endocrine: Negative for endocrine symptoms  Musculoskeletal: Negative for musculoskeletal symptoms  Neurological: Negative for neurological symptoms  Psychologic: Negative for psychiatric symptoms     Physical Exam: There were no vitals taken for this visit.  Constitutional:  Alert and oriented, No acute distress. HEENT: [place] AT, moist mucus membranes.  Trachea midline, no masses. Cardiovascular: No clubbing, cyanosis, or edema. Respiratory: Normal respiratory effort, no increased work of breathing. GI: Abdomen is soft, nontender, nondistended, no abdominal masses GU: No CVA tenderness. DRE45 gm smooth.  Skin: No rashes, bruises or suspicious lesions. Lymph: No cervical or inguinal adenopathy. Neurologic: Grossly intact, no focal deficits, moving all 4 extremities. Psychiatric: Normal mood and affect.  Laboratory Data: Lab Results  Component Value Date   WBC 8.1  11/17/2015   HGB 15.0 11/17/2015   HCT 43.3 11/17/2015   MCV 85.1 11/17/2015   PLT 279 11/17/2015    Lab Results  Component Value Date   CREATININE 1.30 (H) 11/17/2015    No results found for: PSA  No results found for: TESTOSTERONE  No results found for: HGBA1C  Urinalysis    Component Value Date/Time   APPEARANCEUR Clear 04/30/2015 1427   GLUCOSEU Negative 04/30/2015 1427   BILIRUBINUR Negative 04/30/2015 1427   PROTEINUR Trace (A) 04/30/2015 1427   NITRITE Negative 04/30/2015 1427   LEUKOCYTESUR Negative 04/30/2015 1427   PVR "50mL"  Assessment & Plan:    1- Lower Urinary Tract Symptoms - not meeting his goals with alpha blocker alone. As his gland is large, rec addition of finasteride as is best agent for urge / freq. Time course for improment (mos) discussed.    2 - Prostate Screening - no role for furhter PSA based screening as >70 and average risk.  RTC 1 year any provider.    No Follow-up on file.  Michael Thomas, [facility] [address], [place], [place] [address] [phone]

## 2016-07-07 DIAGNOSIS — E119 Type 2 diabetes mellitus without complications: Secondary | ICD-10-CM | POA: Diagnosis not present

## 2016-07-14 DIAGNOSIS — I10 Essential (primary) hypertension: Secondary | ICD-10-CM | POA: Diagnosis not present

## 2016-07-14 DIAGNOSIS — Z0000 Encounter for general adult medical examination without abnormal findings: Secondary | ICD-10-CM | POA: Diagnosis not present

## 2016-07-14 DIAGNOSIS — E119 Type 2 diabetes mellitus without complications: Secondary | ICD-10-CM | POA: Diagnosis not present

## 2016-07-14 DIAGNOSIS — G4733 Obstructive sleep apnea (adult) (pediatric): Secondary | ICD-10-CM | POA: Diagnosis not present

## 2016-10-06 DIAGNOSIS — E119 Type 2 diabetes mellitus without complications: Secondary | ICD-10-CM | POA: Diagnosis not present

## 2016-10-13 DIAGNOSIS — I10 Essential (primary) hypertension: Secondary | ICD-10-CM | POA: Diagnosis not present

## 2016-10-13 DIAGNOSIS — G4733 Obstructive sleep apnea (adult) (pediatric): Secondary | ICD-10-CM | POA: Diagnosis not present

## 2016-10-13 DIAGNOSIS — E119 Type 2 diabetes mellitus without complications: Secondary | ICD-10-CM | POA: Diagnosis not present

## 2016-12-05 ENCOUNTER — Encounter: Payer: Self-pay | Admitting: Osteopathic Medicine

## 2016-12-05 ENCOUNTER — Ambulatory Visit (INDEPENDENT_AMBULATORY_CARE_PROVIDER_SITE_OTHER): Payer: PPO | Admitting: Osteopathic Medicine

## 2016-12-05 VITALS — BP 116/70 | HR 89 | Temp 97.7°F | Ht 61.0 in | Wt 134.0 lb

## 2016-12-05 DIAGNOSIS — E119 Type 2 diabetes mellitus without complications: Secondary | ICD-10-CM

## 2016-12-05 DIAGNOSIS — R05 Cough: Secondary | ICD-10-CM | POA: Diagnosis not present

## 2016-12-05 DIAGNOSIS — R059 Cough, unspecified: Secondary | ICD-10-CM

## 2016-12-05 LAB — POCT GLYCOSYLATED HEMOGLOBIN (HGB A1C): Hemoglobin A1C: 7.3

## 2016-12-05 MED ORDER — BENZONATATE 200 MG PO CAPS: 200.0000 mg | ORAL_CAPSULE | Freq: Three times a day (TID) | ORAL | 0 refills | Status: DC | PRN

## 2016-12-05 NOTE — Progress Notes (Signed)
HPI: Michael Thomas is a 71 y.o. male  who presents to [facility] today, 12/05/16,  for chief complaint of:  Chief Complaint  Patient presents with  . Other    SWITCH FROM HOMMEL  . Cough    Patient has other PCP who is currently monitoring his chronic medical conditions including hypertension, diabetes, sleep apnea.  Acute illness: Patient states over past 2 weeks has been experiencing cough/flu symptoms which have gotten medically better over the past few days but he decided to keep this appointment anyway. No fever, chills at this point. No productive cough or weakness.   Past medical, surgical, social and family history reviewed: Patient Active Problem List   Diagnosis Date Noted  . Prostate cancer screening 05/03/2016  . Other headache syndrome 11/19/2015  . Benign prostatic hyperplasia with urinary obstruction 03/25/2015  . Diabetes mellitus, type 2 ([place]) 01/24/2014  . Obstructive apnea 01/24/2014  . Essential hypertension, benign 02/01/2013  . Hyperlipidemia 02/01/2013  . Vitamin D deficiency 02/01/2013   Past Surgical History:  Procedure Laterality Date  . DG ARTHRO THUMB*L*    . NECK SURGERY     Social History  Substance Use Topics  . Smoking status: Never Smoker  . Smokeless tobacco: Never Used  . Alcohol use No   Family History  Problem Relation Age of Onset  . Stroke Mother   . Diabetes Mellitus II Mother   . Prostate cancer Neg Hx   . Kidney cancer Neg Hx   . Bladder Cancer Neg Hx      Current medication list and allergy/intolerance information reviewed:   Current Outpatient Prescriptions  Medication Sig Dispense Refill  . aspirin EC 81 MG tablet Take by mouth.    . cholecalciferol (VITAMIN D) 1000 UNITS tablet Take 1,000 Units by mouth daily.    . finasteride (PROSCAR) 5 MG tablet Take 1 tablet (5 mg total) by mouth daily. 90 tablet 3  . lisinopril (PRINIVIL,ZESTRIL) 10 MG tablet Take 10 mg by mouth daily.     . Magnesium 300 MG CAPS One by mouth daily to help prevent muscle cramps.  0  . Magnesium 400 MG CAPS Take by mouth.    . metFORMIN (GLUCOPHAGE) 500 MG tablet Take 500 mg by mouth 3 (three) times daily.    . simvastatin (ZOCOR) 40 MG tablet Take 40 mg by mouth every evening.    . tamsulosin (FLOMAX) 0.4 MG CAPS capsule Take 1 capsule (0.4 mg total) by mouth daily. 90 capsule 3   No current facility-administered medications for this visit.    No Known Allergies    Review of Systems:  Constitutional:  No  fever, no chills, +recent illness, No unintentional weight changes. No significant fatigue.   HEENT: No  headache, no vision change, no hearing change, No sore throat, No  sinus pressure   Cardiac: No  chest pain, No  pressure, No palpitations  Respiratory:  No  shortness of breath. No  Cough  Gastrointestinal: No  abdominal pain, No  nausea  Musculoskeletal: No new myalgia/arthralgia  Skin: No  Rash  Exam:  BP 116/70   Pulse 89   Temp 97.7 F (36.5 C) (Oral)   Ht 5\' 1"  (1.549 m)   Wt 134 lb (60.8 kg)   BMI 25.32 kg/m   Constitutional: VS see above. General Appearance: alert, well-developed, well-nourished, NAD  Eyes: Normal lids and conjunctive, non-icteric sclera  Ears, Nose, Mouth, Throat: MMM, Normal external inspection ears/nares/mouth/lips/gums.  Neck: No masses, trachea midline. No tenderness/mass appreciated. No lymphadenopathy  Respiratory: Normal respiratory effort. no wheeze, no rhonchi, no rales  Cardiovascular: S1/S2 normal, no murmur, no rub/gallop auscultated. RRR. No lower extremity edema.   Skin: warm, dry, intact.   Psychiatric: Normal judgment/insight. Normal mood and affect. Oriented x3.    Results for orders placed or performed in visit on 12/05/16 (from the past 72 hour(s))  POCT HgB A1C     Status: None   Collection Time: 12/05/16  3:26 PM  Result Value Ref Range   Hemoglobin A1C 7.3       ASSESSMENT/PLAN: Patient overall  feeling much better, I see no need for aggressive medication management at this time for what is likely a resolving viral bronchitis. Patient advised to return to clinic as needed  Cough in adult patient  Diabetes mellitus without complication ([place]) - Plan: POCT HgB A1C    Patient Instructions  Cough likely due to viral bronchitis - lungs sound okay today. Can take cough medicine as needed - the cough may linger for a few weeks but as long as you're not feeling any more fever or chills, shortness of breath, or severe fatigue, you should not need an Xray or additional medicines. Please call us if any questions, or if you're feeling worse     Visit summary with medication list and pertinent instructions was printed for patient to review. All questions at time of visit were answered - patient instructed to contact office with any additional concerns. ER/RTC precautions were reviewed with the patient. Follow-up plan: Return if symptoms worsen or fail to improve and for routine care as directed by Dr. Gilford Rile.

## 2016-12-05 NOTE — Patient Instructions (Signed)
Cough likely due to viral bronchitis - lungs sound okay today. Can take cough medicine as needed - the cough may linger for a few weeks but as long as you're not feeling any more fever or chills, shortness of breath, or severe fatigue, you should not need an Xray or additional medicines. Please call us if any questions, or if you're feeling worse

## 2016-12-07 DIAGNOSIS — E1165 Type 2 diabetes mellitus with hyperglycemia: Secondary | ICD-10-CM | POA: Diagnosis not present

## 2017-01-05 DIAGNOSIS — E119 Type 2 diabetes mellitus without complications: Secondary | ICD-10-CM | POA: Diagnosis not present

## 2017-01-12 DIAGNOSIS — I10 Essential (primary) hypertension: Secondary | ICD-10-CM | POA: Diagnosis not present

## 2017-01-12 DIAGNOSIS — G4733 Obstructive sleep apnea (adult) (pediatric): Secondary | ICD-10-CM | POA: Diagnosis not present

## 2017-01-12 DIAGNOSIS — E119 Type 2 diabetes mellitus without complications: Secondary | ICD-10-CM | POA: Diagnosis not present

## 2017-05-02 ENCOUNTER — Ambulatory Visit: Payer: PPO

## 2017-05-04 DIAGNOSIS — E119 Type 2 diabetes mellitus without complications: Secondary | ICD-10-CM | POA: Diagnosis not present

## 2017-05-09 ENCOUNTER — Ambulatory Visit: Payer: PPO

## 2017-05-11 DIAGNOSIS — M5489 Other dorsalgia: Secondary | ICD-10-CM | POA: Diagnosis not present

## 2017-05-11 DIAGNOSIS — I10 Essential (primary) hypertension: Secondary | ICD-10-CM | POA: Diagnosis not present

## 2017-05-11 DIAGNOSIS — Z0000 Encounter for general adult medical examination without abnormal findings: Secondary | ICD-10-CM | POA: Diagnosis not present

## 2017-05-11 DIAGNOSIS — E119 Type 2 diabetes mellitus without complications: Secondary | ICD-10-CM | POA: Diagnosis not present

## 2017-06-08 DIAGNOSIS — M79641 Pain in right hand: Secondary | ICD-10-CM | POA: Diagnosis not present

## 2017-06-08 DIAGNOSIS — E119 Type 2 diabetes mellitus without complications: Secondary | ICD-10-CM | POA: Diagnosis not present

## 2017-06-08 DIAGNOSIS — S6991XA Unspecified injury of right wrist, hand and finger(s), initial encounter: Secondary | ICD-10-CM | POA: Diagnosis not present

## 2017-09-07 DIAGNOSIS — E119 Type 2 diabetes mellitus without complications: Secondary | ICD-10-CM | POA: Diagnosis not present

## 2017-09-14 DIAGNOSIS — R413 Other amnesia: Secondary | ICD-10-CM | POA: Diagnosis not present

## 2017-09-14 DIAGNOSIS — I10 Essential (primary) hypertension: Secondary | ICD-10-CM | POA: Diagnosis not present

## 2017-09-14 DIAGNOSIS — E785 Hyperlipidemia, unspecified: Secondary | ICD-10-CM | POA: Diagnosis not present

## 2017-09-14 DIAGNOSIS — N138 Other obstructive and reflux uropathy: Secondary | ICD-10-CM | POA: Diagnosis not present

## 2017-09-14 DIAGNOSIS — R202 Paresthesia of skin: Secondary | ICD-10-CM | POA: Diagnosis not present

## 2017-09-14 DIAGNOSIS — M79662 Pain in left lower leg: Secondary | ICD-10-CM | POA: Diagnosis not present

## 2017-09-14 DIAGNOSIS — N401 Enlarged prostate with lower urinary tract symptoms: Secondary | ICD-10-CM | POA: Diagnosis not present

## 2017-09-14 DIAGNOSIS — E119 Type 2 diabetes mellitus without complications: Secondary | ICD-10-CM | POA: Diagnosis not present

## 2017-09-15 ENCOUNTER — Other Ambulatory Visit: Payer: Self-pay | Admitting: Internal Medicine

## 2017-09-15 DIAGNOSIS — R413 Other amnesia: Secondary | ICD-10-CM

## 2017-09-19 ENCOUNTER — Encounter (INDEPENDENT_AMBULATORY_CARE_PROVIDER_SITE_OTHER): Payer: Self-pay | Admitting: Vascular Surgery

## 2017-09-19 ENCOUNTER — Ambulatory Visit (INDEPENDENT_AMBULATORY_CARE_PROVIDER_SITE_OTHER): Payer: PPO | Admitting: Vascular Surgery

## 2017-09-19 VITALS — BP 125/72 | HR 68 | Resp 13 | Ht 61.0 in | Wt 143.0 lb

## 2017-09-19 DIAGNOSIS — E785 Hyperlipidemia, unspecified: Secondary | ICD-10-CM

## 2017-09-19 DIAGNOSIS — I10 Essential (primary) hypertension: Secondary | ICD-10-CM

## 2017-09-19 DIAGNOSIS — E118 Type 2 diabetes mellitus with unspecified complications: Secondary | ICD-10-CM | POA: Diagnosis not present

## 2017-09-19 DIAGNOSIS — I739 Peripheral vascular disease, unspecified: Secondary | ICD-10-CM

## 2017-09-19 NOTE — Progress Notes (Signed)
Patient ID: Michael Thomas, male   DOB: [birth year], 72 y.o.   MRN: [id number]  Chief Complaint  Patient presents with  . New Patient (Initial Visit)    Claudication    HPI Michael Thomas is a 72 y.o. male.  I am asked to see the patient by Dr. Edwina Barth for evaluation of left lower extremity claudication.  The patient reports several months of Thomas and cramping in his left calf with activity.  This comes on at about 50-100 feet of walking.  This starts as a burning and gnawing Thomas in the left calf that becomes too severe to continue walking.  He does not have a history of ischemic rest Thomas, ulceration, or infection of either lower extremity.  No previous history of vascular disease to his knowledge.  He does have significant atherosclerotic risk factors as listed below.  No significant right leg symptoms.  There was no clear cause or inciting event that started the symptoms.  Nothing has really made it better.   Past Medical History:  Diagnosis Date  . Benign prostatic hyperplasia with urinary obstruction 03/25/2015  . Diabetes mellitus without complication ([place])   . Hyperlipidemia   . Kidney stone   . OSA (obstructive sleep apnea)   . Vitamin D deficiency     Past Surgical History:  Procedure Laterality Date  . DG ARTHRO THUMB*L*    . NECK SURGERY      Family History  Problem Relation Age of Onset  . Stroke Mother   . Diabetes Mellitus II Mother   . Prostate cancer Neg Hx   . Kidney cancer Neg Hx   . Bladder Cancer Neg Hx   No bleeding or clotting disorders  Social History Social History   Tobacco Use  . Smoking status: Never Smoker  . Smokeless tobacco: Never Used  Substance Use Topics  . Alcohol use: No  . Drug use: No     No Known Allergies  Current Outpatient Medications  Medication Sig Dispense Refill  . aspirin EC 81 MG tablet Take by mouth.    . benzonatate (TESSALON) 200 MG capsule Take 1 capsule (200 mg total) by mouth 3 (three) times  daily as needed for cough. 30 capsule 0  . glipiZIDE (GLUCOTROL) 5 MG tablet Take by mouth.    Marland Kitchen lisinopril (PRINIVIL,ZESTRIL) 10 MG tablet Take 10 mg by mouth daily.    . Magnesium 400 MG CAPS Take by mouth.    . Magnesium Oxide 400 MG CAPS Take by mouth.    . metFORMIN (GLUCOPHAGE) 500 MG tablet Take 500 mg by mouth 3 (three) times daily.    . simvastatin (ZOCOR) 40 MG tablet Take 40 mg by mouth every evening.     No current facility-administered medications for this visit.       REVIEW OF SYSTEMS (Negative unless checked)  Constitutional: [] Weight loss  [] Fever  [] Chills Cardiac: [] Chest Thomas   [] Chest pressure   [] Palpitations   [] Shortness of breath when laying flat   [] Shortness of breath at rest   [] Shortness of breath with exertion. Vascular:  [] Thomas in legs with walking   [] Thomas in legs at rest   [] Thomas in legs when laying flat   [x] Claudication   [] Thomas in feet when walking  [] Thomas in feet at rest  [] Thomas in feet when laying flat   [] History of DVT   [] Phlebitis   [] Swelling in legs   [] Varicose veins   [] Non-healing ulcers Pulmonary:   []   Uses home oxygen   [] Productive cough   [] Hemoptysis   [] Wheeze  [] COPD   [] Asthma Neurologic:  [] Dizziness  [] Blackouts   [] Seizures   [] History of stroke   [] History of TIA  [] Aphasia   [] Temporary blindness   [] Dysphagia   [] Weakness or numbness in arms   [] Weakness or numbness in legs Musculoskeletal:  [x] Arthritis   [] Joint swelling   [] Joint Thomas   [] Low back Thomas Hematologic:  [] Easy bruising  [] Easy bleeding   [] Hypercoagulable state   [] Anemic  [] Hepatitis Gastrointestinal:  [] Blood in stool   [] Vomiting blood  [] Gastroesophageal reflux/heartburn   [] Abdominal Thomas Genitourinary:  [] Chronic kidney disease   [] Difficult urination  [x] Frequent urination  [] Burning with urination   [] Hematuria Skin:  [] Rashes   [] Ulcers   [] Wounds Psychological:  [] History of anxiety   []  History of major depression.    Physical Exam BP 125/72 (BP  Location: Right Arm, Patient Position: Sitting)   Pulse 68   Resp 13   Ht 5\' 1"  (1.549 m)   Wt 64.9 kg (143 lb)   BMI 27.02 kg/m  Gen:  WD/WN, NAD, appears younger than stated age. Head: [place]/AT, No temporalis wasting.  Ear/Nose/Throat: Hearing grossly intact, nares w/o erythema or drainage, oropharynx w/o Erythema/Exudate Eyes: Conjunctiva clear, sclera non-icteric  Neck: trachea midline.  Neck supple Pulmonary:  Good air movement, respirations not labored, no use of accessory muscles Cardiac: RRR, no JVD Vascular:  Vessel Right Left  Radial Palpable Palpable                          PT  1+ palpable  1+ palpable  DP Palpable  trace palpable   Gastrointestinal: soft, non-tender/non-distended.   Musculoskeletal: M/S 5/5 throughout.  Extremities without ischemic changes.  No deformity or atrophy.  No edema. Neurologic: Sensation grossly intact in extremities.  Symmetrical.  Speech is fluent. Motor exam as listed above. Psychiatric: Judgment intact, Mood & affect appropriate for pt's clinical situation. Dermatologic: No rashes or ulcers noted.  No cellulitis or open wounds.    Radiology No results found.  Labs No results found for this or any previous visit (from the past 2160 hour(s)).  Assessment/Plan:  Essential hypertension, benign blood pressure control important in reducing the progression of atherosclerotic disease. On appropriate oral medications.   Diabetes mellitus, type 2 (HCC) blood glucose control important in reducing the progression of atherosclerotic disease. Also, involved in wound healing. On appropriate medications.   Hyperlipidemia lipid control important in reducing the progression of atherosclerotic disease. Continue statin therapy   Claudication [facility]) Recommend:  Patient should undergo arterial duplex of the lower extremity ASAP because there has been a significant deterioration in the patient's lower extremity symptoms.  The patient states  they are having increased Thomas and a marked decrease in the distance that they can walk.  The risks and benefits as well as the alternatives were discussed in detail with the patient.  All questions were answered.  Patient agrees to proceed and understands this could be a prelude to angiography and intervention.  The patient will follow up with me in the office to review the studies.   aspirin and statin agent should continue      Michael Thomas 09/19/2017, 1:18 PM   This note was created with Dragon medical transcription system.  Any errors from dictation are unintentional.

## 2017-09-19 NOTE — Assessment & Plan Note (Signed)
blood pressure control important in reducing the progression of atherosclerotic disease. On appropriate oral medications.  

## 2017-09-19 NOTE — Assessment & Plan Note (Signed)
Recommend:  Patient should undergo arterial duplex of the lower extremity ASAP because there has been a significant deterioration in the patient's lower extremity symptoms.  The patient states they are having increased pain and a marked decrease in the distance that they can walk.  The risks and benefits as well as the alternatives were discussed in detail with the patient.  All questions were answered.  Patient agrees to proceed and understands this could be a prelude to angiography and intervention.  The patient will follow up with me in the office to review the studies.   aspirin and statin agent should continue

## 2017-09-19 NOTE — Assessment & Plan Note (Signed)
blood glucose control important in reducing the progression of atherosclerotic disease. Also, involved in wound healing. On appropriate medications.  

## 2017-09-19 NOTE — Patient Instructions (Signed)

## 2017-09-19 NOTE — Assessment & Plan Note (Signed)
lipid control important in reducing the progression of atherosclerotic disease. Continue statin therapy  

## 2017-09-25 ENCOUNTER — Ambulatory Visit
Admission: RE | Admit: 2017-09-25 | Discharge: 2017-09-25 | Disposition: A | Discharge status: Home / Self Care | Payer: PPO | Source: Ambulatory Visit | Attending: Internal Medicine | Admitting: Internal Medicine

## 2017-09-25 DIAGNOSIS — R413 Other amnesia: Secondary | ICD-10-CM | POA: Diagnosis not present

## 2017-09-25 DIAGNOSIS — G319 Degenerative disease of nervous system, unspecified: Secondary | ICD-10-CM | POA: Diagnosis not present

## 2017-09-27 DIAGNOSIS — E118 Type 2 diabetes mellitus with unspecified complications: Secondary | ICD-10-CM | POA: Diagnosis not present

## 2017-10-04 ENCOUNTER — Ambulatory Visit (INDEPENDENT_AMBULATORY_CARE_PROVIDER_SITE_OTHER): Payer: PPO | Admitting: Vascular Surgery

## 2017-10-04 ENCOUNTER — Encounter (INDEPENDENT_AMBULATORY_CARE_PROVIDER_SITE_OTHER): Payer: Self-pay | Admitting: Vascular Surgery

## 2017-10-04 ENCOUNTER — Ambulatory Visit (INDEPENDENT_AMBULATORY_CARE_PROVIDER_SITE_OTHER): Payer: PPO

## 2017-10-04 VITALS — BP 155/80 | HR 62 | Resp 17 | Wt 143.6 lb

## 2017-10-04 DIAGNOSIS — I739 Peripheral vascular disease, unspecified: Secondary | ICD-10-CM | POA: Diagnosis not present

## 2017-10-04 DIAGNOSIS — I10 Essential (primary) hypertension: Secondary | ICD-10-CM | POA: Diagnosis not present

## 2017-10-04 DIAGNOSIS — E785 Hyperlipidemia, unspecified: Secondary | ICD-10-CM | POA: Diagnosis not present

## 2017-10-04 DIAGNOSIS — E118 Type 2 diabetes mellitus with unspecified complications: Secondary | ICD-10-CM | POA: Diagnosis not present

## 2017-10-04 NOTE — Progress Notes (Signed)
Subjective:    Patient ID: Michael Thomas, male    DOB: [birth year], 72 y.o.   MRN: [id number] Chief Complaint  Patient presents with  . Follow-up    Left arterial ultrasound   Patient presents to review vascular studies.  The patient was last seen on September 19, 2017 for evaluation of left lower extremity claudication.  Raye Sorrow nursing assistant was present during the office visit and examination.  The patient continues to experience intermittent calf claudication.  The patient underwent a bilateral ABI which was notable for triphasic tibials.  The patient underwent a left lower extremity arterial duplex which was notable for triphasic blood flow distally.  The patient denies any rest pain or ulceration to the lower extremity.  The patient denies any fever, nausea or vomiting.   Review of Systems  Constitutional: Negative.   HENT: Negative.   Eyes: Negative.   Respiratory: Negative.   Cardiovascular:       Left calf claudication  Gastrointestinal: Negative.   Endocrine: Negative.   Genitourinary: Negative.   Musculoskeletal: Negative.   Skin: Negative.   Allergic/Immunologic: Negative.   Neurological: Negative.   Hematological: Negative.   Psychiatric/Behavioral: Negative.       Objective:   Physical Exam  Constitutional: He is oriented to person, place, and time. He appears well-developed and well-nourished. No distress.  HENT:  Head: Normocephalic and atraumatic.  Eyes: Conjunctivae are normal. Pupils are equal, round, and reactive to light.  Neck: Normal range of motion.  Cardiovascular: Normal rate, regular rhythm, normal heart sounds and intact distal pulses.  Pulses:      Radial pulses are 2+ on the right side, and 2+ on the left side.       Dorsalis pedis pulses are 1+ on the right side, and 1+ on the left side.       Posterior tibial pulses are 1+ on the right side, and 1+ on the left side.  Pulmonary/Chest: Effort normal.  Musculoskeletal: Normal  range of motion. He exhibits no edema.  Neurological: He is alert and oriented to person, place, and time.  Skin: Skin is warm and dry. He is not diaphoretic.  Psychiatric: He has a normal mood and affect. His behavior is normal. Judgment and thought content normal.  Vitals reviewed.  BP (!) 155/80 (BP Location: Right Arm)   Pulse 62   Resp 17   Wt 143 lb 9.6 oz (65.1 kg)   BMI 27.13 kg/m   Past Medical History:  Diagnosis Date  . Benign prostatic hyperplasia with urinary obstruction 03/25/2015  . Diabetes mellitus without complication ([place])   . Hyperlipidemia   . Kidney stone   . OSA (obstructive sleep apnea)   . Vitamin D deficiency    Social History   Socioeconomic History  . Marital status: Married    Spouse name: Not on file  . Number of children: Not on file  . Years of education: Not on file  . Highest education level: Not on file  Social Needs  . Financial resource strain: Not on file  . Food insecurity - worry: Not on file  . Food insecurity - inability: Not on file  . Transportation needs - medical: Not on file  . Transportation needs - non-medical: Not on file  Occupational History  . Not on file  Tobacco Use  . Smoking status: Never Smoker  . Smokeless tobacco: Never Used  Substance and Sexual Activity  . Alcohol use: No  . Drug use: No  .  Sexual activity: Yes    Birth control/protection: None  Other Topics Concern  . Not on file  Social History Narrative  . Not on file   Past Surgical History:  Procedure Laterality Date  . DG ARTHRO THUMB*L*    . NECK SURGERY     Family History  Problem Relation Age of Onset  . Stroke Mother   . Diabetes Mellitus II Mother   . Prostate cancer Neg Hx   . Kidney cancer Neg Hx   . Bladder Cancer Neg Hx    No Known Allergies     Assessment & Plan:  Patient presents to review vascular studies.  The patient was last seen on September 19, 2017 for evaluation of left lower extremity claudication.  Raye Sorrow  nursing assistant was present during the office visit and examination.  The patient continues to experience intermittent calf claudication.  The patient underwent a bilateral ABI which was notable for triphasic tibials.  The patient underwent a left lower extremity arterial duplex which was notable for triphasic blood flow distally.  The patient denies any rest pain or ulceration to the lower extremity.  The patient denies any fever, nausea or vomiting.  1. Claudication ([place]) - Stable I discussed the patient's ABI and left lower extremity arterial duplex results with the patient. The patient was unhappy that we did not find any significant arterial occlusive disease within the left extremity The patient asked for a copy of his arterial duplex as he was going to "head over to Dr. Tillman Sers office" and "show him the results". The patient was not interested in discussing any other reasons that may be causing his left lower extremity claudication We are happy to see the patient back in the office if he wants to discuss repeating any ultrasounds or undergoing a left lower extremity angiogram if he would like in the future The patient is to follow-up as needed  2. Hyperlipidemia, unspecified hyperlipidemia type - Stable Encouraged good control as its slows the progression of atherosclerotic disease  3. Type 2 diabetes mellitus with complication, without long-term current use of insulin (HCC) - Stable Encouraged good control as its slows the progression of atherosclerotic disease  4. Essential hypertension, benign - Stable Encouraged good control as its slows the progression of atherosclerotic disease  Current Outpatient Medications on File Prior to Visit  Medication Sig Dispense Refill  . aspirin EC 81 MG tablet Take by mouth.    Marland Kitchen glipiZIDE (GLUCOTROL) 5 MG tablet Take by mouth.    Marland Kitchen lisinopril (PRINIVIL,ZESTRIL) 10 MG tablet Take 10 mg by mouth daily.    . metFORMIN (GLUCOPHAGE) 1000 MG tablet  Take 1,000 mg by mouth 2 (two) times daily.     . simvastatin (ZOCOR) 40 MG tablet Take 40 mg by mouth every evening.    . benzonatate (TESSALON) 200 MG capsule Take 1 capsule (200 mg total) by mouth 3 (three) times daily as needed for cough. (Patient not taking: Reported on 10/04/2017) 30 capsule 0  . Magnesium 400 MG CAPS Take by mouth.    . Magnesium Oxide 400 MG CAPS Take by mouth.    . metFORMIN (GLUCOPHAGE) 500 MG tablet Take 500 mg by mouth 3 (three) times daily.     No current facility-administered medications on file prior to visit.    There are no Patient Instructions on file for this visit. No Follow-up on file.  Boden Stucky A Chance Munter, PA-C

## 2017-10-06 DIAGNOSIS — M79605 Pain in left leg: Secondary | ICD-10-CM | POA: Diagnosis not present

## 2017-10-16 ENCOUNTER — Other Ambulatory Visit: Payer: Self-pay | Admitting: Sports Medicine

## 2017-10-16 DIAGNOSIS — M5136 Other intervertebral disc degeneration, lumbar region: Secondary | ICD-10-CM

## 2017-10-16 DIAGNOSIS — M79605 Pain in left leg: Secondary | ICD-10-CM | POA: Diagnosis not present

## 2017-11-13 ENCOUNTER — Ambulatory Visit
Admission: RE | Admit: 2017-11-13 | Discharge: 2017-11-13 | Disposition: A | Discharge status: Home / Self Care | Payer: PPO | Source: Ambulatory Visit | Attending: Sports Medicine | Admitting: Sports Medicine

## 2017-11-13 DIAGNOSIS — M48061 Spinal stenosis, lumbar region without neurogenic claudication: Secondary | ICD-10-CM | POA: Insufficient documentation

## 2017-11-13 DIAGNOSIS — M545 Low back pain: Secondary | ICD-10-CM | POA: Diagnosis not present

## 2017-11-13 DIAGNOSIS — M5136 Other intervertebral disc degeneration, lumbar region: Secondary | ICD-10-CM | POA: Diagnosis not present

## 2017-11-13 DIAGNOSIS — M79605 Pain in left leg: Secondary | ICD-10-CM | POA: Insufficient documentation

## 2017-11-16 DIAGNOSIS — M79605 Pain in left leg: Secondary | ICD-10-CM | POA: Diagnosis not present

## 2017-11-16 DIAGNOSIS — M5136 Other intervertebral disc degeneration, lumbar region: Secondary | ICD-10-CM | POA: Diagnosis not present

## 2017-11-22 ENCOUNTER — Other Ambulatory Visit: Payer: Self-pay | Admitting: Sports Medicine

## 2017-11-22 DIAGNOSIS — M79605 Pain in left leg: Secondary | ICD-10-CM

## 2017-11-22 DIAGNOSIS — I739 Peripheral vascular disease, unspecified: Secondary | ICD-10-CM

## 2017-12-06 ENCOUNTER — Ambulatory Visit
Admission: RE | Admit: 2017-12-06 | Discharge: 2017-12-06 | Disposition: A | Discharge status: Home / Self Care | Payer: PPO | Source: Ambulatory Visit | Attending: Sports Medicine | Admitting: Sports Medicine

## 2017-12-06 DIAGNOSIS — M1712 Unilateral primary osteoarthritis, left knee: Secondary | ICD-10-CM | POA: Diagnosis not present

## 2017-12-06 DIAGNOSIS — M79605 Pain in left leg: Secondary | ICD-10-CM | POA: Insufficient documentation

## 2017-12-06 DIAGNOSIS — I739 Peripheral vascular disease, unspecified: Secondary | ICD-10-CM | POA: Diagnosis not present

## 2017-12-07 DIAGNOSIS — R202 Paresthesia of skin: Secondary | ICD-10-CM | POA: Diagnosis not present

## 2017-12-07 DIAGNOSIS — E119 Type 2 diabetes mellitus without complications: Secondary | ICD-10-CM | POA: Diagnosis not present

## 2017-12-14 DIAGNOSIS — N138 Other obstructive and reflux uropathy: Secondary | ICD-10-CM | POA: Diagnosis not present

## 2017-12-14 DIAGNOSIS — L259 Unspecified contact dermatitis, unspecified cause: Secondary | ICD-10-CM | POA: Diagnosis not present

## 2017-12-14 DIAGNOSIS — E119 Type 2 diabetes mellitus without complications: Secondary | ICD-10-CM | POA: Diagnosis not present

## 2017-12-14 DIAGNOSIS — E785 Hyperlipidemia, unspecified: Secondary | ICD-10-CM | POA: Diagnosis not present

## 2017-12-14 DIAGNOSIS — I10 Essential (primary) hypertension: Secondary | ICD-10-CM | POA: Diagnosis not present

## 2017-12-14 DIAGNOSIS — M79605 Pain in left leg: Secondary | ICD-10-CM | POA: Diagnosis not present

## 2017-12-14 DIAGNOSIS — N401 Enlarged prostate with lower urinary tract symptoms: Secondary | ICD-10-CM | POA: Diagnosis not present

## 2017-12-14 DIAGNOSIS — G4733 Obstructive sleep apnea (adult) (pediatric): Secondary | ICD-10-CM | POA: Diagnosis not present

## 2017-12-25 ENCOUNTER — Ambulatory Visit (INDEPENDENT_AMBULATORY_CARE_PROVIDER_SITE_OTHER): Payer: PPO | Admitting: Sports Medicine

## 2017-12-25 ENCOUNTER — Encounter: Payer: Self-pay | Admitting: Sports Medicine

## 2017-12-25 DIAGNOSIS — I739 Peripheral vascular disease, unspecified: Secondary | ICD-10-CM | POA: Diagnosis not present

## 2017-12-25 DIAGNOSIS — G5602 Carpal tunnel syndrome, left upper limb: Secondary | ICD-10-CM

## 2017-12-25 DIAGNOSIS — E118 Type 2 diabetes mellitus with unspecified complications: Secondary | ICD-10-CM | POA: Diagnosis not present

## 2017-12-25 DIAGNOSIS — Z0000 Encounter for general adult medical examination without abnormal findings: Secondary | ICD-10-CM

## 2017-12-25 HISTORY — DX: Carpal tunnel syndrome, left upper limb: G56.02

## 2017-12-25 MED ORDER — CILOSTAZOL 100 MG PO TABS
100.0000 mg | ORAL_TABLET | Freq: Two times a day (BID) | ORAL | 3 refills | Status: DC
Start: 2017-12-25 — End: 2018-01-30

## 2017-12-25 NOTE — Patient Instructions (Signed)
Cilostazol tablets What is this medicine? CILOSTAZOL (sil [place] sta zol) is used to treat the symptoms of intermittent claudication. This condition causes pain in the legs during walking, and goes away with rest. By improving blood flow, this medicine helps people with this condition walk longer distances without pain. This medicine may be used for other purposes; ask your health care provider or pharmacist if you have questions. COMMON BRAND NAME(S): Pletal What should I tell my health care provider before I take this medicine? They need to know if you have any of the following conditions: -bleeding disorder or hemophilia -history of heart failure, heart attack, or other heart disease -an unusual or allergic reaction to cilostazol, other medicines, foods, dyes, or preservatives -pregnant or trying to get pregnant -breast-feeding How should I use this medicine? Take this medicine by mouth with a full glass of water. Follow the directions on the prescription label. Take this medicine on an empty stomach, at least 30 minutes before or 2 hours after food. Do not take with food. Take your doses at regular intervals. Do not take your medicine more often than directed. Talk to your pediatrician regarding the use of this medicine in children. Special care may be needed. Overdosage: If you think you have taken too much of this medicine contact a poison control center or emergency room at once. NOTE: This medicine is only for you. Do not share this medicine with others. What if I miss a dose? If you miss a dose, take it as soon as you can. If it is almost time for your next dose, take only that dose. Do not take double or extra doses. What may interact with this medicine? Do not take this medicine with any of the following medications: -grapefruit juice This medicine may also interact with the following medications: -agents that prevent or treat blood clots like enoxaparin or  warfarin -aspirin -diltiazem -erythromycin or clarithromycin -omeprazole -some medications for treating depression like fluoxetine, fluvoxamine, nefazodone -some medications for treating fungal infections like ketoconazole, fluconazole, itraconazole This list may not describe all possible interactions. Give your health care provider a list of all the medicines, herbs, non-prescription drugs, or dietary supplements you use. Also tell them if you smoke, drink alcohol, or use illegal drugs. Some items may interact with your medicine. What should I watch for while using this medicine? Visit your doctor or health care professional for regular checks on your progress. It may take 2 to 4 weeks for your condition to start to get better once you begin taking this medicine. In some people, it can take as long as 3 months for the condition to get better. You may get drowsy or dizzy. Do not drive, use machinery, or do anything that needs mental alertness until you know how this drug affects you. Do not stand or sit up quickly, especially if you are an older patient. This reduces the risk of dizzy or fainting spells. Alcohol can make you more drowsy and dizzy. Avoid alcoholic drinks. Smoking may have effects on the circulation that may limit the benefits you receive from this medicine. You may wish to discuss how to stop smoking with your doctor or health care professional. If you are going to have surgery, tell your doctor or health care professional that you are taking this medicine. What side effects may I notice from receiving this medicine? Side effects that you should report to your doctor or health care professional as soon as possible: -allergic reactions like skin rash,  itching or hives, swelling of the face, lips, or tongue -chest pain -fast, slow, or irregular heartbeat -signs and symptoms of bleeding such as bloody or black, tarry stools; red or dark-brown urine; spitting up blood or brown material  that looks like coffee grounds; red spots on the skin; unusual bruising or bleeding from the eye, gums, or nose -swelling in the legs or ankles Side effects that usually do not require medical attention (report to your doctor or health care professional if they continue or are bothersome): -diarrhea -headache -nausea, or upset stomach This list may not describe all possible side effects. Call your doctor for medical advice about side effects. You may report side effects to FDA at 1-800-FDA-1088. Where should I keep my medicine? Keep out of the reach of children. Store at room temperature between 15 and 30 degrees C (59 and 86 degrees F). Throw away any unused medicine after the expiration date. NOTE: This sheet is a summary. It may not cover all possible information. If you have questions about this medicine, talk to your doctor, pharmacist, or health care provider.  2018 Elsevier/Gold Standard (2012-11-15 15:00:52)

## 2017-12-25 NOTE — Assessment & Plan Note (Signed)
Continue glipizide, metformin, recheck A1c in August, orders placed.

## 2017-12-25 NOTE — Assessment & Plan Note (Addendum)
Intermittent claudication of the left calf, ABI 1.0 on the right, 0.8 on the left, this is borderline for peripheral arterial disease. Good palpable pulses, no critical PAD. Adding cilostazol. We will also work aggressively on control of his diabetes. Also adding an MRA of the left lower extremity.  In spite of a borderline normal ABI on the left, MRA is positive for a short segment of severe stenosis of the superficial femoral artery on the left highly suspicious to be the cause of his left calf claudication.  Discussed with interventional radiology, they would like to try a drug-eluting stent angioplasty in this short segment, orders placed for IR eval.

## 2017-12-25 NOTE — Progress Notes (Addendum)
Subjective:    CC: Establish care.   HPI:  This is a very pleasant 72 year old male, he comes in with a chief complaint of left calf pain, cramping in the posterior calf when walking distances, the cramping tends to stop immediately after walking.  Not much better walking up or down hill.  No back pain.  He has had a knee MRI that showed some mild arthritis, he had a lumbar spine MRI that showed some mild degenerative disc disease but no foraminal stenosis that would explain his symptoms.  He did have an ABI that was normal on the right, 0.8 on the left.  Has never been on medications for this or had imaging of his vascular structures.  In addition he gets numbness and tingling in his right hand intermittently through the day, radiation to the thumb, through fourth fingers.  I reviewed the past medical history, family history, social history, surgical history, and allergies today and no changes were needed.  Please see the problem list section below in epic for further details.  Past Medical History: Past Medical History:  Diagnosis Date  . Benign prostatic hyperplasia with urinary obstruction 03/25/2015  . Diabetes mellitus without complication ([place])   . Hyperlipidemia   . Kidney stone   . OSA (obstructive sleep apnea)   . Vitamin D deficiency    Past Surgical History: Past Surgical History:  Procedure Laterality Date  . DG ARTHRO THUMB*L*    . NECK SURGERY    . VASECTOMY     Social History: Social History   Socioeconomic History  . Marital status: Married    Spouse name: Not on file  . Number of children: Not on file  . Years of education: Not on file  . Highest education level: Not on file  Occupational History  . Not on file  Social Needs  . Financial resource strain: Not on file  . Food insecurity:    Worry: Not on file    Inability: Not on file  . Transportation needs:    Medical: Not on file    Non-medical: Not on file  Tobacco Use  . Smoking status: Never  Smoker  . Smokeless tobacco: Never Used  Substance and Sexual Activity  . Alcohol use: No  . Drug use: No  . Sexual activity: Yes    Birth control/protection: None  Lifestyle  . Physical activity:    Days per week: Not on file    Minutes per session: Not on file  . Stress: Not on file  Relationships  . Social connections:    Talks on phone: Not on file    Gets together: Not on file    Attends religious service: Not on file    Active member of club or organization: Not on file    Attends meetings of clubs or organizations: Not on file    Relationship status: Not on file  Other Topics Concern  . Not on file  Social History Narrative  . Not on file   Family History: Family History  Problem Relation Age of Onset  . Stroke Mother   . Diabetes Mellitus II Mother   . Prostate cancer Neg Hx   . Kidney cancer Neg Hx   . Bladder Cancer Neg Hx    Allergies: No Known Allergies Medications: See med rec.  Review of Systems: No headache, visual changes, nausea, vomiting, diarrhea, constipation, dizziness, abdominal pain, skin rash, fevers, chills, night sweats, swollen lymph nodes, weight loss, chest pain, body aches,  joint swelling, muscle aches, shortness of breath, mood changes, visual or auditory hallucinations.  Objective:    General: Well Developed, well nourished, and in no acute distress.  Neuro: Alert and oriented x3, extra-ocular muscles intact, sensation grossly intact.  HEENT: Normocephalic, atraumatic, pupils equal round reactive to light, neck supple, no masses, no lymphadenopathy, thyroid nonpalpable.  Skin: Warm and dry, no rashes noted.  Cardiac: Regular rate and rhythm, no murmurs rubs or gallops.  Respiratory: Clear to auscultation bilaterally. Not using accessory muscles, speaking in full sentences.  Abdominal: Soft, nontender, nondistended, positive bowel sounds, no masses, no organomegaly.  Left Wrist: Inspection normal with no visible erythema or  swelling. ROM smooth and normal with good flexion and extension and ulnar/radial deviation that is symmetrical with opposite wrist. Palpation is normal over metacarpals, navicular, lunate, and TFCC; tendons without tenderness/ swelling No snuffbox tenderness. No tenderness over Canal of [place]. Strength 5/5 in all directions without pain. Positive Tinel's and phalens signs. Negative Finkelstein sign. Negative Watson's test. Left leg: Palpable dorsalis pedis and posterior tibial pulses.  No calf tenderness.  Knee exam is unremarkable.  Diabetic Foot Exam Both feet were examined, there are no signs of ulceration or abnormal callus. Nails are unremarkable. Dorsalis pedis and posterior tibial pulses are palpable. Sensation is intact to sharp and monofilament. Shoes are of appropriate fitment.  Impression and Recommendations:    The patient was counselled, risk factors were discussed, anticipatory guidance given.  Intermittent claudication left calf Intermittent claudication of the left calf, ABI 1.0 on the right, 0.8 on the left, this is borderline for peripheral arterial disease. Good palpable pulses, no critical PAD. Adding cilostazol. We will also work aggressively on control of his diabetes. Also adding an MRA of the left lower extremity.  In spite of a borderline normal ABI on the left, MRA is positive for a short segment of severe stenosis of the superficial femoral artery on the left highly suspicious to be the cause of his left calf claudication.  Discussed with interventional radiology, they would like to try a drug-eluting stent angioplasty in this short segment, orders placed for IR eval.  Carpal tunnel syndrome on left Starting with nighttime splinting, return in 1 month, Hydro dissection if no better.  Diabetes mellitus, type 2 (HCC) Continue glipizide, metformin, recheck A1c in August, orders placed.   Annual physical exam Up-to-date on most screening measures, adding  hep C screening. At the follow-up visit we will probably not got his pneumococcal vaccine. ___________________________________________ Gwen Her. Dianah Field, M.D., ABFM., CAQSM. Primary Care and [place] In[employer] of [place] [employer] [facility] [employer] [facility]

## 2017-12-25 NOTE — Assessment & Plan Note (Signed)
Up-to-date on most screening measures, adding hep C screening. At the follow-up visit we will probably not got his pneumococcal vaccine.

## 2017-12-25 NOTE — Assessment & Plan Note (Signed)
Starting with nighttime splinting, return in 1 month, Hydro dissection if no better.

## 2018-01-02 ENCOUNTER — Telehealth: Payer: Self-pay | Admitting: Sports Medicine

## 2018-01-02 DIAGNOSIS — I739 Peripheral vascular disease, unspecified: Secondary | ICD-10-CM

## 2018-01-02 NOTE — Telephone Encounter (Signed)
Switching maging location for left lower extremity MR angiogram to [place].

## 2018-01-10 ENCOUNTER — Other Ambulatory Visit: Payer: Self-pay

## 2018-01-10 DIAGNOSIS — I739 Peripheral vascular disease, unspecified: Secondary | ICD-10-CM

## 2018-01-12 ENCOUNTER — Ambulatory Visit (HOSPITAL_COMMUNITY): Admission: RE | Admit: 2018-01-12 | Payer: PPO | Source: Ambulatory Visit

## 2018-01-16 ENCOUNTER — Ambulatory Visit (HOSPITAL_COMMUNITY)
Admission: RE | Admit: 2018-01-16 | Discharge: 2018-01-16 | Disposition: A | Discharge status: Home / Self Care | Payer: PPO | Source: Ambulatory Visit | Attending: Sports Medicine | Admitting: Sports Medicine

## 2018-01-16 DIAGNOSIS — I739 Peripheral vascular disease, unspecified: Secondary | ICD-10-CM | POA: Insufficient documentation

## 2018-01-16 DIAGNOSIS — I700 Atherosclerosis of aorta: Secondary | ICD-10-CM | POA: Diagnosis not present

## 2018-01-16 DIAGNOSIS — I70209 Unspecified atherosclerosis of native arteries of extremities, unspecified extremity: Secondary | ICD-10-CM | POA: Diagnosis not present

## 2018-01-16 MED ORDER — GADOBENATE DIMEGLUMINE 529 MG/ML IV SOLN
15.0000 mL | Freq: Once | INTRAVENOUS | Status: AC | PRN
  Administered 2018-01-16: 15 mL via INTRAVENOUS

## 2018-01-17 LAB — POCT I-STAT CREATININE: Creatinine, Ser: 1.3 mg/dL — ABNORMAL HIGH (ref 0.61–1.24)

## 2018-01-18 ENCOUNTER — Other Ambulatory Visit: Payer: Self-pay | Admitting: Sports Medicine

## 2018-01-18 DIAGNOSIS — I739 Peripheral vascular disease, unspecified: Secondary | ICD-10-CM

## 2018-01-18 NOTE — Addendum Note (Signed)
Addended by: Silverio Decamp on: 01/18/2018 09:48 AM   Modules accepted: Orders

## 2018-01-19 ENCOUNTER — Telehealth: Payer: Self-pay | Admitting: *Deleted

## 2018-01-19 NOTE — Telephone Encounter (Signed)
Called Mr. Golberg 6/13 and 6/14 to sch consult for claudication lmom/vm

## 2018-01-22 ENCOUNTER — Telehealth: Payer: Self-pay | Admitting: *Deleted

## 2018-01-22 NOTE — Telephone Encounter (Signed)
lmom to sch consult./vm

## 2018-01-30 ENCOUNTER — Ambulatory Visit (INDEPENDENT_AMBULATORY_CARE_PROVIDER_SITE_OTHER): Payer: PPO | Admitting: Sports Medicine

## 2018-01-30 ENCOUNTER — Encounter: Payer: Self-pay | Admitting: Sports Medicine

## 2018-01-30 DIAGNOSIS — Z0000 Encounter for general adult medical examination without abnormal findings: Secondary | ICD-10-CM | POA: Diagnosis not present

## 2018-01-30 DIAGNOSIS — E118 Type 2 diabetes mellitus with unspecified complications: Secondary | ICD-10-CM

## 2018-01-30 DIAGNOSIS — I739 Peripheral vascular disease, unspecified: Secondary | ICD-10-CM

## 2018-01-30 DIAGNOSIS — G5602 Carpal tunnel syndrome, left upper limb: Secondary | ICD-10-CM

## 2018-01-30 NOTE — Assessment & Plan Note (Signed)
At the follow-up visit he will need Tdap, pneumococcal 13. Hepatitis C screening.

## 2018-01-30 NOTE — Assessment & Plan Note (Signed)
Rechecking hemoglobin A1c 1 month after angioplasty

## 2018-01-30 NOTE — Patient Instructions (Signed)
Return to see me 1 month after drug-eluting stent angioplasty of the femoral artery.

## 2018-01-30 NOTE — Assessment & Plan Note (Signed)
Persistent claudication, normal ABI, arterial duplex scans, index of suspicion was high so we added an MR angiogram from the aorta down to the tibial arteries, there is a distinct stenotic area in the common femoral artery above the knee. He is scheduled for catheter balloon angioplasty with drug-eluting stent into the femoral artery with interventional radiology. Please tell was ineffective, discontinue this. I would like to see Michael Thomas after his angioplasty.

## 2018-01-30 NOTE — Progress Notes (Signed)
Subjective:    CC: Follow-up  HPI: Left calf claudication: MR angiography did show a distinct discrete area of severe stenosis of the superficial femoral artery amenable to interventional radiology drug-eluting stent angioplasty.  Left carpal tunnel syndrome: Resolved with nighttime splinting  I reviewed the past medical history, family history, social history, surgical history, and allergies today and no changes were needed.  Please see the problem list section below in epic for further details.  Past Medical History: Past Medical History:  Diagnosis Date  . Benign prostatic hyperplasia with urinary obstruction 03/25/2015  . Diabetes mellitus without complication ([facility])   . Hyperlipidemia   . Kidney stone   . OSA (obstructive sleep apnea)   . Vitamin D deficiency    Past Surgical History: Past Surgical History:  Procedure Laterality Date  . DG ARTHRO THUMB*L*    . NECK SURGERY    . VASECTOMY     Social History: Social History   Socioeconomic History  . Marital status: Married    Spouse name: Not on file  . Number of children: Not on file  . Years of education: Not on file  . Highest education level: Not on file  Occupational History  . Not on file  Social Needs  . Financial resource strain: Not on file  . Food insecurity:    Worry: Not on file    Inability: Not on file  . Transportation needs:    Medical: Not on file    Non-medical: Not on file  Tobacco Use  . Smoking status: Never Smoker  . Smokeless tobacco: Never Used  Substance and Sexual Activity  . Alcohol use: No  . Drug use: No  . Sexual activity: Yes    Birth control/protection: None  Lifestyle  . Physical activity:    Days per week: Not on file    Minutes per session: Not on file  . Stress: Not on file  Relationships  . Social connections:    Talks on phone: Not on file    Gets together: Not on file    Attends religious service: Not on file    Active member of club or organization: Not on  file    Attends meetings of clubs or organizations: Not on file    Relationship status: Not on file  Other Topics Concern  . Not on file  Social History Narrative  . Not on file   Family History: Family History  Problem Relation Age of Onset  . Stroke Mother   . Diabetes Mellitus II Mother   . Prostate cancer Neg Hx   . Kidney cancer Neg Hx   . Bladder Cancer Neg Hx    Allergies: No Known Allergies Medications: See med rec.  Review of Systems: No fevers, chills, night sweats, weight loss, chest pain, or shortness of breath.   Objective:    General: Well Developed, well nourished, and in no acute distress.  Neuro: Alert and oriented x3, extra-ocular muscles intact, sensation grossly intact.  HEENT: Normocephalic, atraumatic, pupils equal round reactive to light, neck supple, no masses, no lymphadenopathy, thyroid nonpalpable.  Skin: Warm and dry, no rashes. Cardiac: Regular rate and rhythm, no murmurs rubs or gallops, no lower extremity edema.  Respiratory: Clear to auscultation bilaterally. Not using accessory muscles, speaking in full sentences.  Impression and Recommendations:    Intermittent claudication left calf Persistent claudication, normal ABI, arterial duplex scans, index of suspicion was high so we added an MR angiogram from the aorta down to the  tibial arteries, there is a distinct stenotic area in the common femoral artery above the knee. He is scheduled for catheter balloon angioplasty with drug-eluting stent into the femoral artery with interventional radiology. Please tell was ineffective, discontinue this. I would like to see Michael Thomas after his angioplasty.  Diabetes mellitus, type 2 (HCC) Rechecking hemoglobin A1c 1 month after angioplasty  Carpal tunnel syndrome on left Symptoms resolved now after nighttime splinting, no need for median nerve hydrodissection.  Annual physical exam At the follow-up visit he will need Tdap, pneumococcal 13. Hepatitis C  screening.  I spent 25 minutes with this patient, greater than 50% was face-to-face time counseling regarding the above diagnoses ___________________________________________ Gwen Her. Dianah Field, M.D., ABFM., CAQSM. Primary Care and [place] In[employer] of [employer] [facility] [employer] [facility]

## 2018-01-30 NOTE — Assessment & Plan Note (Signed)
Symptoms resolved now after nighttime splinting, no need for median nerve hydrodissection.

## 2018-02-01 ENCOUNTER — Encounter: Payer: Self-pay | Admitting: Radiology

## 2018-02-01 ENCOUNTER — Ambulatory Visit
Admission: RE | Admit: 2018-02-01 | Discharge: 2018-02-01 | Disposition: A | Discharge status: Home / Self Care | Payer: PPO | Source: Ambulatory Visit | Attending: Sports Medicine | Admitting: Sports Medicine

## 2018-02-01 DIAGNOSIS — I739 Peripheral vascular disease, unspecified: Secondary | ICD-10-CM | POA: Diagnosis not present

## 2018-02-01 HISTORY — PX: IR RADIOLOGIST EVAL & MGMT: IMG5224

## 2018-02-01 NOTE — Consult Note (Signed)
Chief Complaint: Left Leg Pain  Referring Physician(s): Thekkekandam,Thomas J  History of Present Illness: Michael Thomas is a 72 y.o. male presenting for a scheduled consultation to Vascular & Interventional Radiology, kindly referred by Dr. Dianah Field, for evaluation of his left leg pain/claudication and candidacy for endovascular treatment.   Michael Thomas is here today with his wife for the consultation.    He tells me that he has been having worsening left leg pain with short distance claudication over the course of several months now.  He recently had a trip with his wife and friends to [facility] and [place] and was significantly limited in his ability to walk around the [place].  He is disappointed in his inability to walk/run on the treadmill at the gym.    The symptoms are worsening over time.  He has never had a wound.  He denies any resting pain of the lower extremity.    He has had prior consult at [facility], and the result of a non-invasive in February shows resting ABI on the right of 1.0, and the left 0.8.  Given his symptoms, a post-exercise ABI on the left may deteriorate.      Past Medical History:  Diagnosis Date  . Benign prostatic hyperplasia with urinary obstruction 03/25/2015  . Diabetes mellitus without complication ([place])   . Hyperlipidemia   . Kidney stone   . OSA (obstructive sleep apnea)   . Vitamin D deficiency     Past Surgical History:  Procedure Laterality Date  . DG ARTHRO THUMB*L*    . NECK SURGERY    . VASECTOMY      Allergies: Patient has no known allergies.  Medications: Prior to Admission medications   Medication Sig Start Date End Date Taking? Authorizing Provider  aspirin EC 81 MG tablet Take by mouth.   Yes [provider]  glipiZIDE (GLUCOTROL) 5 MG tablet Take by mouth. 06/15/17 06/15/18 Yes [provider]  lisinopril (PRINIVIL,ZESTRIL) 10 MG tablet Take 10 mg by mouth daily.   Yes [provider]  Magnesium 400 MG CAPS Take by mouth.   Yes [provider]  metFORMIN (GLUCOPHAGE) 1000 MG tablet Take 1,000 mg by mouth 2 (two) times daily.  10/01/17  Yes [provider]  simvastatin (ZOCOR) 40 MG tablet Take 40 mg by mouth every evening.   Yes [provider]  tamsulosin (FLOMAX) 0.4 MG CAPS capsule Take by mouth. 11/20/17  Yes [provider]  Magnesium Oxide 400 MG CAPS Take by mouth.    [provider]     Family History  Problem Relation Age of Onset  . Stroke Mother   . Diabetes Mellitus II Mother   . Prostate cancer Neg Hx   . Kidney cancer Neg Hx   . Bladder Cancer Neg Hx     Social History   Socioeconomic History  . Marital status: Married    Spouse name: Not on file  . Number of children: Not on file  . Years of education: Not on file  . Highest education level: Not on file  Occupational History  . Not on file  Social Needs  . Financial resource strain: Not on file  . Food insecurity:    Worry: Not on file    Inability: Not on file  . Transportation needs:    Medical: Not on file    Non-medical: Not on file  Tobacco Use  . Smoking status: Never Smoker  . Smokeless tobacco: Never  Used  Substance and Sexual Activity  . Alcohol use: No  . Drug use: No  . Sexual activity: Yes    Birth control/protection: None  Lifestyle  . Physical activity:    Days per week: Not on file    Minutes per session: Not on file  . Stress: Not on file  Relationships  . Social connections:    Talks on phone: Not on file    Gets together: Not on file    Attends religious service: Not on file    Active member of club or organization: Not on file    Attends meetings of clubs or organizations: Not on file    Relationship status: Not on file  Other Topics Concern  . Not on file  Social History Narrative  . Not on file    Review of Systems: A 12 point ROS discussed and pertinent positives are indicated in the HPI  above.  All other systems are negative.  Review of Systems  Vital Signs: BP 140/75   Pulse 68   Temp 98 F (36.7 C) (Oral)   Resp 14   Ht 5' (1.524 m)   Wt 138 lb (62.6 kg)   SpO2 99%   BMI 26.95 kg/m   Physical Exam General: 72 yo male appearing younger than stated age.  Well-developed, well-nourished.  No distress. HEENT: Atraumatic, normocephalic. Glasses. Conjugate gaze, extra-ocular motor intact. No scleral icterus or scleral injection. No lesions on external ears, nose, lips, or gums.  Oral mucosa moist, pink.  Neck: Symmetric with no goiter enlargement.  Chest/Lungs:  Symmetric chest with inspiration/expiration.  No labored breathing.  Clear to auscultation with no wheezes, rhonchi, or rales.  Heart:  RRR, with no third heart sounds appreciated. No JVD appreciated.  Abdomen:  Soft, NT/ND, with + bowel sounds.   Genito-urinary: Deferred Neurologic: Alert & Oriented to person, place, and time.   Normal affect and insight.  Appropriate questions.  Moving all 4 extremities with gross sensory intact.  Pulse Exam:  Doppler positive on the bilateral PT and DP stronger on the right. .   Extremities: No wounds or swelling of the lower extremities.  Imaging: Michael Michael Thomas Abdomen W Wo Contrast  Addendum Date: 01/18/2018   ADDENDUM REPORT: 01/18/2018 08:54 ADDENDUM: These results were called by telephone at the time of interpretation on 01/18/2018 at 8:54 am to Dr. Aundria Thomas , who verbally acknowledged these results. Electronically Signed   By: Jerilynn Mages.  Shick M.D.   On: 01/18/2018 08:54   Result Date: 01/18/2018 CLINICAL DATA:  Peripheral vascular disease, left lower extremity claudication, left calf pain with walking EXAM: MRA ABDOMEN AND PELVIS WITH CONTRAST MRA LOWER EXTREMITY RUNOFF WITH CONTRAST TECHNIQUE: Multiplanar, multiecho pulse sequences of the abdomen, pelvis and lower extremities were obtained with intravenous contrast. Angiographic images of abdomen, pelvis and lower  extremities were obtained using MRA technique with intravenous contrast. CONTRAST:  72mL MULTIHANCE GADOBENATE DIMEGLUMINE 529 MG/ML IV SOLN COMPARISON:  None. FINDINGS: MRA ABDOMEN FINDINGS Thoracoabdominal aorta visualized is patent with moderate atherosclerosis of the infrarenal aorta. Negative for aortic occlusion, dissection, or aneurysm. No aortoiliac occlusive disease. Celiac, SMA and IMA are widely patent. Negative for mesenteric vascular occlusive disease. Single widely patent bilateral renal arteries. Negative for renal vascular disease. No accessory renal artery. Nonvascular: Renal cysts noted bilaterally. No hydronephrosis or obstruction. No biliary dilatation or obstruction. Gallbladder is collapsed. No biliary dilatation or obstruction. No focal hepatic abnormality within the limits of the study. No veno-occlusive process. Negative  for bowel obstruction. Visualized lower chest unremarkable. MRA PELVIS FINDINGS Mild iliac atherosclerosis and tortuosity. Common, internal and external iliac arteries are patent bilaterally. No iliac inflow disease or occlusive process. Nonvascular: Limited assessment by MRA technique. No visualized free fluid or large mass. No other acute process. Iliac veins appear patent. MRA lower extremities: Right lower extremity: Right common femoral, profunda femoral, and SFA remain patent. Mild SFA luminal irregularity without significant focal stenosis or occlusive process. Popliteal artery is also patent across the knee. Below the knee, there is venous contamination. This limits evaluation of the runoff. Anterior tibial artery is patent to the ankle. Visualized portions of the tibioperoneal trunk and posterior tibial arteries appear grossly patent. Peroneal artery is not well visualized. Left lower extremity: Left common femoral, profunda femoral, proximal and mid segments of the SFA are patent. Left distal SFA at the abductor canal demonstrates a focal short-segment flow  gap/occlusion with small adjacent collateral vessels. Left distal SFA is quickly reconstituted. Left popliteal artery is patent across the knee. Below the knee, there is venous contamination limiting evaluation of the runoff. The left anterior tibial artery and tibial peroneal trunk appear patent. Difficult to assess the peroneal and posterior tibial arteries because of adjacent venous contamination and mild artifact. IMPRESSION: Diffuse aortoiliac atherosclerosis without central occlusive disease. Negative for aneurysm or dissection. Mild iliac atherosclerosis and tortuosity. Iliac vasculature remains patent without inflow disease. Nonocclusive right lower extremity peripheral vascular disease. Limited assessment of the right infrapopliteal runoff because of venous contamination. Left distal SFA focal occlusion at the abductor canal with adjacent small collaterals. This would account for the patient's left lower extremity claudication symptoms. Left distal SFA quickly reconstitutes. Left popliteal artery is patent across the knee. Left tibial and peroneal vasculature appears grossly patent but limited assessment because of adjacent venous contamination. Electronically Signed: By: Jerilynn Mages.  Shick M.D. On: 01/17/2018 16:21   Michael Michael Thomas Pelvis W Wo Contrast  Addendum Date: 01/18/2018   ADDENDUM REPORT: 01/18/2018 08:54 ADDENDUM: These results were called by telephone at the time of interpretation on 01/18/2018 at 8:54 am to Dr. Aundria Thomas , who verbally acknowledged these results. Electronically Signed   By: Jerilynn Mages.  Shick M.D.   On: 01/18/2018 08:54   Result Date: 01/18/2018 CLINICAL DATA:  Peripheral vascular disease, left lower extremity claudication, left calf pain with walking EXAM: MRA ABDOMEN AND PELVIS WITH CONTRAST MRA LOWER EXTREMITY RUNOFF WITH CONTRAST TECHNIQUE: Multiplanar, multiecho pulse sequences of the abdomen, pelvis and lower extremities were obtained with intravenous contrast. Angiographic images  of abdomen, pelvis and lower extremities were obtained using MRA technique with intravenous contrast. CONTRAST:  31mL MULTIHANCE GADOBENATE DIMEGLUMINE 529 MG/ML IV SOLN COMPARISON:  None. FINDINGS: MRA ABDOMEN FINDINGS Thoracoabdominal aorta visualized is patent with moderate atherosclerosis of the infrarenal aorta. Negative for aortic occlusion, dissection, or aneurysm. No aortoiliac occlusive disease. Celiac, SMA and IMA are widely patent. Negative for mesenteric vascular occlusive disease. Single widely patent bilateral renal arteries. Negative for renal vascular disease. No accessory renal artery. Nonvascular: Renal cysts noted bilaterally. No hydronephrosis or obstruction. No biliary dilatation or obstruction. Gallbladder is collapsed. No biliary dilatation or obstruction. No focal hepatic abnormality within the limits of the study. No veno-occlusive process. Negative for bowel obstruction. Visualized lower chest unremarkable. MRA PELVIS FINDINGS Mild iliac atherosclerosis and tortuosity. Common, internal and external iliac arteries are patent bilaterally. No iliac inflow disease or occlusive process. Nonvascular: Limited assessment by MRA technique. No visualized free fluid or large mass. No other acute process.  Iliac veins appear patent. MRA lower extremities: Right lower extremity: Right common femoral, profunda femoral, and SFA remain patent. Mild SFA luminal irregularity without significant focal stenosis or occlusive process. Popliteal artery is also patent across the knee. Below the knee, there is venous contamination. This limits evaluation of the runoff. Anterior tibial artery is patent to the ankle. Visualized portions of the tibioperoneal trunk and posterior tibial arteries appear grossly patent. Peroneal artery is not well visualized. Left lower extremity: Left common femoral, profunda femoral, proximal and mid segments of the SFA are patent. Left distal SFA at the abductor canal demonstrates a  focal short-segment flow gap/occlusion with small adjacent collateral vessels. Left distal SFA is quickly reconstituted. Left popliteal artery is patent across the knee. Below the knee, there is venous contamination limiting evaluation of the runoff. The left anterior tibial artery and tibial peroneal trunk appear patent. Difficult to assess the peroneal and posterior tibial arteries because of adjacent venous contamination and mild artifact. IMPRESSION: Diffuse aortoiliac atherosclerosis without central occlusive disease. Negative for aneurysm or dissection. Mild iliac atherosclerosis and tortuosity. Iliac vasculature remains patent without inflow disease. Nonocclusive right lower extremity peripheral vascular disease. Limited assessment of the right infrapopliteal runoff because of venous contamination. Left distal SFA focal occlusion at the abductor canal with adjacent small collaterals. This would account for the patient's left lower extremity claudication symptoms. Left distal SFA quickly reconstitutes. Left popliteal artery is patent across the knee. Left tibial and peroneal vasculature appears grossly patent but limited assessment because of adjacent venous contamination. Electronically Signed: By: Jerilynn Mages.  Shick M.D. On: 01/17/2018 16:21   Michael Michael Thomas Lower Extremity Left W Wo Contrast  Addendum Date: 01/18/2018   ADDENDUM REPORT: 01/18/2018 08:54 ADDENDUM: These results were called by telephone at the time of interpretation on 01/18/2018 at 8:54 am to Dr. Aundria Thomas , who verbally acknowledged these results. Electronically Signed   By: Jerilynn Mages.  Shick M.D.   On: 01/18/2018 08:54   Result Date: 01/18/2018 CLINICAL DATA:  Peripheral vascular disease, left lower extremity claudication, left calf pain with walking EXAM: MRA ABDOMEN AND PELVIS WITH CONTRAST MRA LOWER EXTREMITY RUNOFF WITH CONTRAST TECHNIQUE: Multiplanar, multiecho pulse sequences of the abdomen, pelvis and lower extremities were obtained with  intravenous contrast. Angiographic images of abdomen, pelvis and lower extremities were obtained using MRA technique with intravenous contrast. CONTRAST:  101mL MULTIHANCE GADOBENATE DIMEGLUMINE 529 MG/ML IV SOLN COMPARISON:  None. FINDINGS: MRA ABDOMEN FINDINGS Thoracoabdominal aorta visualized is patent with moderate atherosclerosis of the infrarenal aorta. Negative for aortic occlusion, dissection, or aneurysm. No aortoiliac occlusive disease. Celiac, SMA and IMA are widely patent. Negative for mesenteric vascular occlusive disease. Single widely patent bilateral renal arteries. Negative for renal vascular disease. No accessory renal artery. Nonvascular: Renal cysts noted bilaterally. No hydronephrosis or obstruction. No biliary dilatation or obstruction. Gallbladder is collapsed. No biliary dilatation or obstruction. No focal hepatic abnormality within the limits of the study. No veno-occlusive process. Negative for bowel obstruction. Visualized lower chest unremarkable. MRA PELVIS FINDINGS Mild iliac atherosclerosis and tortuosity. Common, internal and external iliac arteries are patent bilaterally. No iliac inflow disease or occlusive process. Nonvascular: Limited assessment by MRA technique. No visualized free fluid or large mass. No other acute process. Iliac veins appear patent. MRA lower extremities: Right lower extremity: Right common femoral, profunda femoral, and SFA remain patent. Mild SFA luminal irregularity without significant focal stenosis or occlusive process. Popliteal artery is also patent across the knee. Below the knee, there is venous contamination. This  limits evaluation of the runoff. Anterior tibial artery is patent to the ankle. Visualized portions of the tibioperoneal trunk and posterior tibial arteries appear grossly patent. Peroneal artery is not well visualized. Left lower extremity: Left common femoral, profunda femoral, proximal and mid segments of the SFA are patent. Left distal  SFA at the abductor canal demonstrates a focal short-segment flow gap/occlusion with small adjacent collateral vessels. Left distal SFA is quickly reconstituted. Left popliteal artery is patent across the knee. Below the knee, there is venous contamination limiting evaluation of the runoff. The left anterior tibial artery and tibial peroneal trunk appear patent. Difficult to assess the peroneal and posterior tibial arteries because of adjacent venous contamination and mild artifact. IMPRESSION: Diffuse aortoiliac atherosclerosis without central occlusive disease. Negative for aneurysm or dissection. Mild iliac atherosclerosis and tortuosity. Iliac vasculature remains patent without inflow disease. Nonocclusive right lower extremity peripheral vascular disease. Limited assessment of the right infrapopliteal runoff because of venous contamination. Left distal SFA focal occlusion at the abductor canal with adjacent small collaterals. This would account for the patient's left lower extremity claudication symptoms. Left distal SFA quickly reconstitutes. Left popliteal artery is patent across the knee. Left tibial and peroneal vasculature appears grossly patent but limited assessment because of adjacent venous contamination. Electronically Signed: By: Jerilynn Mages.  Shick M.D. On: 01/17/2018 16:21    Labs:  CBC: No results for input(s): WBC, HGB, HCT, PLT in the last 8760 hours.  COAGS: No results for input(s): INR, APTT in the last 8760 hours.  BMP: Recent Labs    01/16/18 1444  CREATININE 1.30*    LIVER FUNCTION TESTS: No results for input(s): BILITOT, AST, ALT, ALKPHOS, PROT, ALBUMIN in the last 8760 hours.  TUMOR MARKERS: No results for input(s): AFPTM, CEA, CA199, CHROMGRNA in the last 8760 hours.  Assessment and Plan:  Assessment:  Michael Blizard is a 43 male presenting with severe, life-style limiting, short-distance claudication of the left leg, compatible with Rutherford 3 class symptoms.        Resting non-invasive lower extremity exam has demonstrated a compromised ABI on the left.  I suspect that a post-exercise ABI would further deteriorate on the left.  MRI imaging work-up shows evidence of left femoral-popliteal TASC A disease.    I had a discussion with Michael Powley and his wife regarding anatomy, pathology/pathophysiology, natural history, and prognosis of PAD/CLI. Informed consent regarding treatment strategies was performed which would possibly include medical management, walking program, surgical strategy, and/or endovascular options, with risk/benefit discussion.  The indications for treatment supported by updated guidelines1, 2 were discussed.  Given the presence of diabetes, healthy foot care was also discussed, in accord with multi-disciplinary, Class 1 recommendations.1,3 Current recommendations advocate daily foot inspection, good nail care, avoiding barefoot walking, properly fitted footwear, seeking care with problems, and at least annual inspection3.     Patient has had no relief with his current trial of pletal, and continues to have very significant life-style limiting short distance claudication.    Patient has elected to proceed with endovascular options.   Regarding endovascular options, specific risks discussed include: bleeding, infection, contrast reaction, renal injury/nephropathy, arterial injury/dissection, need for additional procedure/surgery, worsening symptoms/tissue including limb loss, cardiopulmonary collapse, death.    Regarding medical management, maximal medical therapy for reduction of risk factors is indicated as recommended by updated AHA guidelines1.  This includes anti-platelet medication, tight blood glucose control to a HbA1c < 7, tight blood pressure control, maximum-dose HMG-CoA reductase inhibitor, and continued smoking cessation.  He  has successfully quit years ago.     Annual flu vaccination is also recommended, with Class 1 recommendation1.    Plan: - Plan is to proceed with aorto-peripheral angiogram and possible intervention, targeting the left lower extremity.  We will perform this at [facility] - Recommend maximal medical therapy for cardiovascular risk reduction, including anti-platelet therapy. - He may be withdrawn from his pletal/cilostazol medication. I would advise stopping this at least 3-5 days before our case -Observe healthy foot care habits, given the presence of diabetes, with at least annual foot inspection performed in the setting of DM.    -Annual flu vaccination is recommended in the setting of known PAD, in the absence of contra-indications.  - I have advised him to observe all of his other follow up appointments.    ___________________________________________________________   1Morley Kos MD, et al. 2016 AHA/ACC Guideline on the Management of Patients With Lower Extremity Peripheral Artery Disease: Executive Summary: A Report of the American College of Cardiology/American Heart Association Task Force on Clinical Practice Guidelines. J Am Coll Cardiol. 2017 Mar 21;69(11):1465-1508. doi: 10.1016/j.jacc.2016.11.008.   2 - Norgren L, et al. TASC II Working Group. Inter-society consensus for the management of peripheral arterial disease. Int Tressia Miners. 2007 Jun;26(2):81-157. Review. PubMed PMID: 35248185  3 - Hingorani A, et al. The management of diabetic foot: A clinical practice guideline by the Society for Vascular Surgery in collaboration with the [place] and the Society  for Vascular Medicine. J Vasc Surg. 2016 Feb;63(2 Suppl):3S-21S. doi: 10.1016/j.jvs.2015.10.003. PubMed PMID: 90931121.   Thank you for this interesting consult.  I greatly enjoyed meeting Verdell Claude Seifried and look forward to participating in their care.  A copy of this report was sent to the requesting provider on this date.  Electronically Signed: Corrie Mckusick 02/01/2018, 2:05 PM   I spent a  total of  40 Minutes   in face to face in clinical consultation, greater than 50% of which was counseling/coordinating care for Rutherford 3 class symptoms of left lower extremity, PAD, possible angiogram and intervention.

## 2018-02-09 ENCOUNTER — Other Ambulatory Visit (HOSPITAL_COMMUNITY): Payer: Self-pay | Admitting: Interventional Radiology

## 2018-02-09 DIAGNOSIS — I739 Peripheral vascular disease, unspecified: Secondary | ICD-10-CM

## 2018-02-19 ENCOUNTER — Other Ambulatory Visit: Payer: Self-pay | Admitting: Interventional Radiology

## 2018-02-19 ENCOUNTER — Encounter: Payer: Self-pay | Admitting: Interventional Radiology

## 2018-02-20 ENCOUNTER — Other Ambulatory Visit (HOSPITAL_COMMUNITY): Payer: Self-pay | Admitting: Interventional Radiology

## 2018-02-20 ENCOUNTER — Ambulatory Visit (HOSPITAL_COMMUNITY)
Admission: RE | Admit: 2018-02-20 | Discharge: 2018-02-20 | Disposition: A | Discharge status: Home / Self Care | Payer: PPO | Source: Ambulatory Visit | Attending: Interventional Radiology | Admitting: Interventional Radiology

## 2018-02-20 ENCOUNTER — Other Ambulatory Visit: Payer: Self-pay

## 2018-02-20 ENCOUNTER — Encounter (HOSPITAL_COMMUNITY): Payer: Self-pay

## 2018-02-20 DIAGNOSIS — Z992 Dependence on renal dialysis: Secondary | ICD-10-CM

## 2018-02-20 DIAGNOSIS — Z7982 Long term (current) use of aspirin: Secondary | ICD-10-CM | POA: Insufficient documentation

## 2018-02-20 DIAGNOSIS — E559 Vitamin D deficiency, unspecified: Secondary | ICD-10-CM | POA: Insufficient documentation

## 2018-02-20 DIAGNOSIS — E785 Hyperlipidemia, unspecified: Secondary | ICD-10-CM | POA: Insufficient documentation

## 2018-02-20 DIAGNOSIS — I70212 Atherosclerosis of native arteries of extremities with intermittent claudication, left leg: Secondary | ICD-10-CM | POA: Diagnosis not present

## 2018-02-20 DIAGNOSIS — I739 Peripheral vascular disease, unspecified: Secondary | ICD-10-CM

## 2018-02-20 DIAGNOSIS — N186 End stage renal disease: Secondary | ICD-10-CM

## 2018-02-20 DIAGNOSIS — E1151 Type 2 diabetes mellitus with diabetic peripheral angiopathy without gangrene: Secondary | ICD-10-CM | POA: Insufficient documentation

## 2018-02-20 DIAGNOSIS — Z7984 Long term (current) use of oral hypoglycemic drugs: Secondary | ICD-10-CM | POA: Diagnosis not present

## 2018-02-20 DIAGNOSIS — G4733 Obstructive sleep apnea (adult) (pediatric): Secondary | ICD-10-CM | POA: Insufficient documentation

## 2018-02-20 DIAGNOSIS — I771 Stricture of artery: Secondary | ICD-10-CM | POA: Diagnosis not present

## 2018-02-20 HISTORY — PX: IR US GUIDE VASC ACCESS RIGHT: IMG2390

## 2018-02-20 HISTORY — PX: IR ANGIOGRAM EXTREMITY BILATERAL: IMG653

## 2018-02-20 HISTORY — PX: IR FEM POP ART ATHERECT INC PTA MOD SED: IMG2310

## 2018-02-20 HISTORY — PX: IR ANGIOGRAM SELECTIVE EACH ADDITIONAL VESSEL: IMG667

## 2018-02-20 LAB — CBC
HEMATOCRIT: 45.7 % (ref 39.0–52.0)
HEMOGLOBIN: 14.6 g/dL (ref 13.0–17.0)
MCH: 28 pg (ref 26.0–34.0)
MCHC: 31.9 g/dL (ref 30.0–36.0)
MCV: 87.5 fL (ref 78.0–100.0)
Platelets: 309 10*3/uL (ref 150–400)
RBC: 5.22 MIL/uL (ref 4.22–5.81)
RDW: 14.2 % (ref 11.5–15.5)
WBC: 7.5 10*3/uL (ref 4.0–10.5)

## 2018-02-20 LAB — GLUCOSE, CAPILLARY
GLUCOSE-CAPILLARY: 174 mg/dL — AB (ref 70–99)
GLUCOSE-CAPILLARY: 157 mg/dL — AB (ref 70–99)

## 2018-02-20 LAB — PROTIME-INR
INR: 1.03
PROTHROMBIN TIME: 13.4 s (ref 11.4–15.2)

## 2018-02-20 LAB — APTT: APTT: 31 s (ref 24–36)

## 2018-02-20 MED ORDER — FENTANYL CITRATE (PF) 100 MCG/2ML IJ SOLN
INTRAMUSCULAR | Status: AC | PRN
  Administered 2018-02-20 (×2): 50 ug via INTRAVENOUS

## 2018-02-20 MED ORDER — CLOPIDOGREL BISULFATE 75 MG PO TABS
ORAL_TABLET | ORAL | Status: AC
  Filled 2018-02-20: qty 4

## 2018-02-20 MED ORDER — MIDAZOLAM HCL 2 MG/2ML IJ SOLN
INTRAMUSCULAR | Status: AC
  Filled 2018-02-20: qty 4

## 2018-02-20 MED ORDER — LIDOCAINE HCL 1 % IJ SOLN
INTRAMUSCULAR | Status: AC | PRN
  Administered 2018-02-20: 10 mL

## 2018-02-20 MED ORDER — PROTAMINE SULFATE 10 MG/ML IV SOLN
50.0000 mg | Freq: Once | INTRAVENOUS | Status: DC
  Filled 2018-02-20: qty 5

## 2018-02-20 MED ORDER — ASPIRIN 325 MG PO TABS
325.0000 mg | ORAL_TABLET | Freq: Once | ORAL | Status: AC
  Administered 2018-02-20: 325 mg via ORAL

## 2018-02-20 MED ORDER — IODIXANOL 320 MG/ML IV SOLN
200.0000 mL | Freq: Once | INTRAVENOUS | Status: AC | PRN
  Administered 2018-02-20: 95 mL via INTRA_ARTERIAL

## 2018-02-20 MED ORDER — CLOPIDOGREL BISULFATE 75 MG PO TABS
300.0000 mg | ORAL_TABLET | Freq: Once | ORAL | Status: AC
  Administered 2018-02-20: 300 mg via ORAL

## 2018-02-20 MED ORDER — SODIUM CHLORIDE 0.9 % IV SOLN: INTRAVENOUS | Status: DC

## 2018-02-20 MED ORDER — HEPARIN SODIUM (PORCINE) 1000 UNIT/ML IJ SOLN
INTRAMUSCULAR | Status: AC | PRN
  Administered 2018-02-20: 8000 [IU] via INTRAVENOUS

## 2018-02-20 MED ORDER — ASPIRIN 325 MG PO TABS
ORAL_TABLET | ORAL | Status: AC
  Filled 2018-02-20: qty 1

## 2018-02-20 MED ORDER — LIDOCAINE HCL 1 % IJ SOLN
INTRAMUSCULAR | Status: AC
  Filled 2018-02-20: qty 20

## 2018-02-20 MED ORDER — SODIUM CHLORIDE 0.9 % IV SOLN
INTRAVENOUS | Status: AC
  Administered 2018-02-20: 13:00:00 via INTRAVENOUS

## 2018-02-20 MED ORDER — PROTAMINE SULFATE 10 MG/ML IV SOLN
50.0000 mg | Freq: Once | INTRAVENOUS | Status: AC
  Administered 2018-02-20: 50 mg via INTRAVENOUS
  Filled 2018-02-20: qty 5

## 2018-02-20 MED ORDER — MIDAZOLAM HCL 2 MG/2ML IJ SOLN
INTRAMUSCULAR | Status: AC | PRN
  Administered 2018-02-20 (×2): 1 mg via INTRAVENOUS

## 2018-02-20 MED ORDER — HEPARIN SODIUM (PORCINE) 1000 UNIT/ML IJ SOLN
INTRAMUSCULAR | Status: AC
  Filled 2018-02-20: qty 2

## 2018-02-20 MED ORDER — FENTANYL CITRATE (PF) 100 MCG/2ML IJ SOLN
INTRAMUSCULAR | Status: AC
  Filled 2018-02-20: qty 4

## 2018-02-20 MED ORDER — SODIUM CHLORIDE 0.9 % IV SOLN
INTRAVENOUS | Status: AC | PRN
  Administered 2018-02-20: 10 mL/h via INTRAVENOUS

## 2018-02-20 NOTE — Sedation Documentation (Signed)
Attempted to call report, no beds available.

## 2018-02-20 NOTE — Sedation Documentation (Signed)
Attempted to call report to short stay, no beds available.  Number left with unit secretary.

## 2018-02-20 NOTE — Discharge Instructions (Signed)

## 2018-02-20 NOTE — Procedures (Signed)
Interventional Radiology Procedure Note  Procedure: US guided right CFA access, angiogram pelvis and left extremity, atherectomy and drug eluding balloon of critical left SFA stenosis.   Closure of right CFA access.  Complications: None  Recommendations:  - 4 hours right leg straight - local wound care - frequent N/V checks - Dual anti-platelets starting today - Advance diet - DC home later - No heavy activity for 48 hours - Do not submerge for 7 days - shower tomorrow - Follow up with Dr. Earleen Newport in 2-4 weeks at [facility] with non-invasive exam - Routine care   Signed,  Dulcy Fanny. Earleen Newport, DO

## 2018-02-20 NOTE — H&P (Signed)
Chief Complaint: Left leg disabling claudication  Referring Physician(s): Dr. Dianah Field  Supervising Physician: Corrie Mckusick  Patient Status: [facility] - Out-pt  History of Present Illness: Michael Thomas is a 72 y.o. male left leg pain/claudication and candidacy for endovascular treatment.    He has been having worsening left leg pain with short distance claudication over the course of several months now.    He recently had a trip with his wife and friends to [facility] and [place] and was significantly limited in his ability to walk around the [place].    He is disappointed in his inability to walk/run on the treadmill at the gym.    The symptoms are worsening over time.  He has never had a wound.  He denies any resting pain of the lower extremity.    He has had prior consult at [facility], and the result of a non-invasive in February shows resting ABI on the right of 1.0, and the left 0.8.    Given his symptoms, a post-exercise ABI on the left may deteriorate.   He has had no relief with his current trial of pletal, and continues to have very significant life-style limiting short distance claudication.   He is here today for angiography with possible revascularization.  He is NPO.   Past Medical History:  Diagnosis Date  . Benign prostatic hyperplasia with urinary obstruction 03/25/2015  . Diabetes mellitus without complication ([place])   . Hyperlipidemia   . Kidney stone   . OSA (obstructive sleep apnea)   . Vitamin D deficiency     Past Surgical History:  Procedure Laterality Date  . DG ARTHRO THUMB*L*    . IR RADIOLOGIST EVAL & MGMT  02/01/2018  . NECK SURGERY    . VASECTOMY      Allergies: Patient has no known allergies.  Medications: Prior to Admission medications   Medication Sig Start Date End Date Taking? Authorizing Provider  aspirin EC 81 MG tablet Take 81 mg by mouth daily.    Yes [provider]  glipiZIDE (GLUCOTROL) 5 MG  tablet Take 5 mg by mouth 2 (two) times daily before a meal.  06/15/17  Yes [provider]  lisinopril (PRINIVIL,ZESTRIL) 10 MG tablet Take 10 mg by mouth daily.   Yes [provider]  magnesium oxide (MAG-OX) 400 MG tablet Take 400 mg by mouth daily.    Yes [provider]  metFORMIN (GLUCOPHAGE) 1000 MG tablet Take 1,000 mg by mouth 2 (two) times daily with a meal.  10/01/17  Yes [provider]  simvastatin (ZOCOR) 40 MG tablet Take 40 mg by mouth every evening.   Yes [provider]  tamsulosin (FLOMAX) 0.4 MG CAPS capsule Take 0.4 mg by mouth daily.  11/20/17  Yes [provider]     Family History  Problem Relation Age of Onset  . Stroke Mother   . Diabetes Mellitus II Mother   . Prostate cancer Neg Hx   . Kidney cancer Neg Hx   . Bladder Cancer Neg Hx     Social History   Socioeconomic History  . Marital status: Married    Spouse name: Not on file  . Number of children: Not on file  . Years of education: Not on file  . Highest education level: Not on file  Occupational History  . Not on file  Social Needs  . Financial resource strain: Not on file  . Food insecurity:    Worry: Not on  file    Inability: Not on file  . Transportation needs:    Medical: Not on file    Non-medical: Not on file  Tobacco Use  . Smoking status: Never Smoker  . Smokeless tobacco: Never Used  Substance and Sexual Activity  . Alcohol use: No  . Drug use: No  . Sexual activity: Yes    Birth control/protection: None  Lifestyle  . Physical activity:    Days per week: Not on file    Minutes per session: Not on file  . Stress: Not on file  Relationships  . Social connections:    Talks on phone: Not on file    Gets together: Not on file    Attends religious service: Not on file    Active member of club or organization: Not on file    Attends meetings of clubs or organizations: Not on file    Relationship status: Not on file  Other  Topics Concern  . Not on file  Social History Narrative  . Not on file     Review of Systems: A 12 point ROS discussed and pertinent positives are indicated in the HPI above.  All other systems are negative.  Review of Systems  Vital Signs: BP (!) 148/79 (BP Location: Right Arm)   Pulse (!) 57   Temp 97.8 F (36.6 C) (Oral)   Ht 5' (1.524 m)   Wt 140 lb (63.5 kg)   SpO2 100%   BMI 27.34 kg/m   Physical Exam  Constitutional: He is oriented to person, place, and time. He appears well-developed.  HENT:  Head: Normocephalic and atraumatic.  Eyes: EOM are normal.  Neck: Normal range of motion.  Cardiovascular: Normal rate, regular rhythm and normal heart sounds.  Doppler positive on the bilateral PT and DP stronger on the right.  Pulmonary/Chest: Effort normal and breath sounds normal.  Abdominal: Soft.  Musculoskeletal: Normal range of motion.  Neurological: He is alert and oriented to person, place, and time.  Skin: Skin is warm and dry.  Psychiatric: He has a normal mood and affect. His behavior is normal. Judgment and thought content normal.  Vitals reviewed.   Imaging: Ir Radiologist Eval & Mgmt  Result Date: 02/01/2018 Please refer to notes tab for details about interventional procedure. (Op Note)   Labs:  CBC: Recent Labs    02/20/18 0805  WBC 7.5  HGB 14.6  HCT 45.7  PLT 309    COAGS: Recent Labs    02/20/18 0805  INR 1.03  APTT 31    BMP: Recent Labs    01/16/18 1444  CREATININE 1.30*    LIVER FUNCTION TESTS: No results for input(s): BILITOT, AST, ALT, ALKPHOS, PROT, ALBUMIN in the last 8760 hours.  TUMOR MARKERS: No results for input(s): AFPTM, CEA, CA199, CHROMGRNA in the last 8760 hours.  Assessment and Plan:  Diabetes with atherosclerotic disease causing disabling claudication.  Will proceed with aortagram with runoff and possible revascularization/angioplasty/stent by Dr. Earleen Newport.  Risks and benefits of arteriogram were  discussed with the patient including, but not limited to bleeding, infection, vascular injury or contrast induced renal failure.  Will instruct him to hold his metformin tonight.  This interventional procedure involves the use of X-rays and because of the nature of the planned procedure, it is possible that we will have prolonged use of X-ray fluoroscopy.  Potential radiation risks to you include (but are not limited to) the following: - A slightly elevated risk for cancer  several  years later in life. This risk is typically less than 0.5% percent. This risk is low in comparison to the normal incidence of human cancer, which is 33% for women and 50% for men according to the [place]. - Radiation induced injury can include skin redness, resembling a rash, tissue breakdown / ulcers and hair loss (which can be temporary or permanent).   The likelihood of either of these occurring depends on the difficulty of the procedure and whether you are sensitive to radiation due to previous procedures, disease, or genetic conditions.   IF your procedure requires a prolonged use of radiation, you will be notified and given written instructions for further action.  It is your responsibility to monitor the irradiated area for the 2 weeks following the procedure and to notify your physician if you are concerned that you have suffered a radiation induced injury.    All of the patient's questions were answered, patient is agreeable to proceed.  Consent signed and in chart.  Thank you for this interesting consult.  I greatly enjoyed meeting Edie Claude Lamping and look forward to participating in their care.  A copy of this report was sent to the requesting provider on this date.  Electronically Signed: Murrell Redden, PA-C   02/20/2018, 8:47 AM      I spent a total of    25 Minutes in face to face in clinical consultation, greater than 50% of which was counseling/coordinating care for  arteriogram with angioplasty.

## 2018-02-27 ENCOUNTER — Other Ambulatory Visit: Payer: Self-pay | Admitting: Interventional Radiology

## 2018-02-27 DIAGNOSIS — I70219 Atherosclerosis of native arteries of extremities with intermittent claudication, unspecified extremity: Secondary | ICD-10-CM

## 2018-03-20 ENCOUNTER — Ambulatory Visit
Admission: RE | Admit: 2018-03-20 | Discharge: 2018-03-20 | Disposition: A | Discharge status: Home / Self Care | Payer: PPO | Source: Ambulatory Visit | Attending: Interventional Radiology | Admitting: Interventional Radiology

## 2018-03-20 ENCOUNTER — Encounter: Payer: Self-pay | Admitting: Radiology

## 2018-03-20 ENCOUNTER — Ambulatory Visit (INDEPENDENT_AMBULATORY_CARE_PROVIDER_SITE_OTHER): Payer: PPO | Admitting: Sports Medicine

## 2018-03-20 DIAGNOSIS — L989 Disorder of the skin and subcutaneous tissue, unspecified: Secondary | ICD-10-CM | POA: Diagnosis not present

## 2018-03-20 DIAGNOSIS — I10 Essential (primary) hypertension: Secondary | ICD-10-CM

## 2018-03-20 DIAGNOSIS — I70219 Atherosclerosis of native arteries of extremities with intermittent claudication, unspecified extremity: Secondary | ICD-10-CM | POA: Diagnosis not present

## 2018-03-20 DIAGNOSIS — E118 Type 2 diabetes mellitus with unspecified complications: Secondary | ICD-10-CM | POA: Diagnosis not present

## 2018-03-20 DIAGNOSIS — Z7902 Long term (current) use of antithrombotics/antiplatelets: Secondary | ICD-10-CM | POA: Diagnosis not present

## 2018-03-20 DIAGNOSIS — I739 Peripheral vascular disease, unspecified: Secondary | ICD-10-CM

## 2018-03-20 DIAGNOSIS — Z9889 Other specified postprocedural states: Secondary | ICD-10-CM | POA: Diagnosis not present

## 2018-03-20 HISTORY — PX: IR RADIOLOGIST EVAL & MGMT: IMG5224

## 2018-03-20 LAB — POCT GLYCOSYLATED HEMOGLOBIN (HGB A1C): Hemoglobin A1C: 7.4 % — AB (ref 4.0–5.6)

## 2018-03-20 MED ORDER — GLIPIZIDE 5 MG PO TABS
5.0000 mg | ORAL_TABLET | Freq: Two times a day (BID) | ORAL | 3 refills | Status: DC
Start: 2018-03-20 — End: 2018-07-25

## 2018-03-20 MED ORDER — TRIAMCINOLONE ACETONIDE 0.5 % EX CREA: 1.0000 "application " | TOPICAL_CREAM | Freq: Two times a day (BID) | CUTANEOUS | 3 refills | Status: DC

## 2018-03-20 NOTE — Assessment & Plan Note (Signed)
Status post angioplasty with complete resolution of symptoms.

## 2018-03-20 NOTE — Progress Notes (Signed)
Subjective:    CC: Follow-up  HPI: Peripheral arterial disease: Post popliteal artery angioplasty, doing extremely well, symptom-free now.  Hypertension: Well-controlled.  Carpal tunnel syndrome: Symptoms resolved day and night except when working out, does not wear splint when working out.  Diabetes mellitus type 2: Previous A1c was 8.7%,  I reviewed the past medical history, family history, social history, surgical history, and allergies today and no changes were needed.  Please see the problem list section below in epic for further details.  Past Medical History: Past Medical History:  Diagnosis Date  . Benign prostatic hyperplasia with urinary obstruction 03/25/2015  . Diabetes mellitus without complication ([place])   . Hyperlipidemia   . Kidney stone   . OSA (obstructive sleep apnea)   . Vitamin D deficiency    Past Surgical History: Past Surgical History:  Procedure Laterality Date  . DG ARTHRO THUMB*L*    . IR ANGIOGRAM EXTREMITY BILATERAL  02/20/2018  . IR ANGIOGRAM SELECTIVE EACH ADDITIONAL VESSEL  02/20/2018  . IR FEM POP ART ATHERECT INC PTA MOD SED  02/20/2018  . IR RADIOLOGIST EVAL & MGMT  02/01/2018  . IR RADIOLOGIST EVAL & MGMT  03/20/2018  . IR US GUIDE VASC ACCESS RIGHT  02/20/2018  . NECK SURGERY    . VASECTOMY     Social History: Social History   Socioeconomic History  . Marital status: Married    Spouse name: Not on file  . Number of children: Not on file  . Years of education: Not on file  . Highest education level: Not on file  Occupational History  . Not on file  Social Needs  . Financial resource strain: Not on file  . Food insecurity:    Worry: Not on file    Inability: Not on file  . Transportation needs:    Medical: Not on file    Non-medical: Not on file  Tobacco Use  . Smoking status: Never Smoker  . Smokeless tobacco: Never Used  Substance and Sexual Activity  . Alcohol use: No  . Drug use: No  . Sexual activity: Yes    Birth  control/protection: None  Lifestyle  . Physical activity:    Days per week: Not on file    Minutes per session: Not on file  . Stress: Not on file  Relationships  . Social connections:    Talks on phone: Not on file    Gets together: Not on file    Attends religious service: Not on file    Active member of club or organization: Not on file    Attends meetings of clubs or organizations: Not on file    Relationship status: Not on file  Other Topics Concern  . Not on file  Social History Narrative  . Not on file   Family History: Family History  Problem Relation Age of Onset  . Stroke Mother   . Diabetes Mellitus II Mother   . Prostate cancer Neg Hx   . Kidney cancer Neg Hx   . Bladder Cancer Neg Hx    Allergies: No Known Allergies Medications: See med rec.  Review of Systems: No fevers, chills, night sweats, weight loss, chest pain, or shortness of breath.   Objective:    General: Well Developed, well nourished, and in no acute distress.  Neuro: Alert and oriented x3, extra-ocular muscles intact, sensation grossly intact.  HEENT: Normocephalic, atraumatic, pupils equal round reactive to light, neck supple, no masses, no lymphadenopathy, thyroid nonpalpable.  Skin:  Warm and dry, no rashes. Cardiac: Regular rate and rhythm, no murmurs rubs or gallops, no lower extremity edema.  Respiratory: Clear to auscultation bilaterally. Not using accessory muscles, speaking in full sentences.  Impression and Recommendations:    Intermittent claudication left calf Status post angioplasty with complete resolution of symptoms.  Diabetes mellitus, type 2 (HCC) Rechecking hemoglobin A1c, previously 8.7%.   Essential hypertension, benign Well-controlled, no changes needed  Carpal tunnel syndrome on left Symptoms better after nighttime splinting, he does have some recurrence of pain when lifting weights, specifically bicep curl, I advised him to wear his splint during exercise, and  if persistent symptoms at the follow-up visit we will do a median nerve hydrodissection on the left.  I spent 25 minutes with this patient, greater than 50% was face-to-face time counseling regarding the above diagnoses ___________________________________________ Gwen Her. Dianah Field, M.D., ABFM., CAQSM. Primary Care and [place] Instructor of [place] [facility] of Medicine

## 2018-03-20 NOTE — Progress Notes (Signed)
Chief Complaint: PAD  Referring Physician(s): Thekkekandam,Thomas J  History of Present Illness: Michael Thomas is a 72 y.o. male presenting for a scheduled follow up with Vascular & Interventional Radiology, SP atherectomy and drug-eluding balloon angioplasty/re-stenotic therapy (DAART) of the left leg.   Michael Thomas is here again today with his wife for the follow up.    Prior Hx: We first met Michael Thomas 02/01/2018, with complaints of significant, life-style limiting claudication of the left lower extremity.  His main complaint was decreased comfort at the inability to walk/run on the treadmill at the gym, as well as decreased day to day ambulating comfort.    He has had prior consult at [facility], and the result of a non-invasive in February shows resting ABI on the right of 1.0, and the left 0.8.    Interval Hx: Michael Thomas underwent angiogram and treatment on 02/20/2018, with directional atherectomy and drug balloon angioplasty.  He was discharged the same day.   He tells me he recovered well, with minimal bruising at right CFA access site, which has resolved.   He denies any discomfort, and is again able to ambulate without pain.  He seems quite satisfied.    He continues to take the plavix and ASA dual anti-platelet therapy.  Non-invasive testing today shows normalization of the left ABI, with normal segmental studies bilateral LE.    Past Medical History:  Diagnosis Date  . Benign prostatic hyperplasia with urinary obstruction 03/25/2015  . Diabetes mellitus without complication ([place])   . Hyperlipidemia   . Kidney stone   . OSA (obstructive sleep apnea)   . Vitamin D deficiency     Past Surgical History:  Procedure Laterality Date  . DG ARTHRO THUMB*L*    . IR ANGIOGRAM EXTREMITY BILATERAL  02/20/2018  . IR ANGIOGRAM SELECTIVE EACH ADDITIONAL VESSEL  02/20/2018  . IR FEM POP ART ATHERECT INC PTA MOD SED  02/20/2018  . IR RADIOLOGIST EVAL & MGMT  02/01/2018  . IR  US GUIDE VASC ACCESS RIGHT  02/20/2018  . NECK SURGERY    . VASECTOMY      Allergies: Patient has no known allergies.  Medications: Prior to Admission medications   Medication Sig Start Date End Date Taking? Authorizing Provider  aspirin EC 81 MG tablet Take 81 mg by mouth daily.    Yes [provider]  clopidogrel (PLAVIX) 75 MG tablet Take 75 mg by mouth daily.   Yes [provider]  glipiZIDE (GLUCOTROL) 5 MG tablet Take 5 mg by mouth 2 (two) times daily before a meal.  06/15/17  Yes [provider]  lisinopril (PRINIVIL,ZESTRIL) 10 MG tablet Take 10 mg by mouth daily.   Yes [provider]  metFORMIN (GLUCOPHAGE) 1000 MG tablet Take 1,000 mg by mouth 2 (two) times daily with a meal.  10/01/17  Yes [provider]  simvastatin (ZOCOR) 40 MG tablet Take 40 mg by mouth every evening.   Yes [provider]  tamsulosin (FLOMAX) 0.4 MG CAPS capsule Take 0.4 mg by mouth daily.  11/20/17  Yes [provider]  magnesium oxide (MAG-OX) 400 MG tablet Take 400 mg by mouth daily.     [provider]     Family History  Problem Relation Age of Onset  . Stroke Mother   . Diabetes Mellitus II Mother   . Prostate cancer Neg Hx   . Kidney cancer Neg Hx   . Bladder Cancer Neg Hx  Social History   Socioeconomic History  . Marital status: Married    Spouse name: Not on file  . Number of children: Not on file  . Years of education: Not on file  . Highest education level: Not on file  Occupational History  . Not on file  Social Needs  . Financial resource strain: Not on file  . Food insecurity:    Worry: Not on file    Inability: Not on file  . Transportation needs:    Medical: Not on file    Non-medical: Not on file  Tobacco Use  . Smoking status: Never Smoker  . Smokeless tobacco: Never Used  Substance and Sexual Activity  . Alcohol use: No  . Drug use: No  . Sexual activity: Yes    Birth control/protection:  None  Lifestyle  . Physical activity:    Days per week: Not on file    Minutes per session: Not on file  . Stress: Not on file  Relationships  . Social connections:    Talks on phone: Not on file    Gets together: Not on file    Attends religious service: Not on file    Active member of club or organization: Not on file    Attends meetings of clubs or organizations: Not on file    Relationship status: Not on file  Other Topics Concern  . Not on file  Social History Narrative  . Not on file    Review of Systems: A 12 point ROS discussed and pertinent positives are indicated in the HPI above.  All other systems are negative.  Review of Systems  Vital Signs: BP (!) 162/82   Pulse (!) 56   Temp 98.3 F (36.8 C) (Oral)   Resp 14   Ht 5' 1" (1.549 m)   Wt 63.5 kg   SpO2 99%   BMI 26.45 kg/m   Physical Exam Targeted exam shows pulses maintained bilateral pedal pulses.   Mallampati Score:  Imaging: Ir Angiogram Extremity Bilateral  Result Date: 02/20/2018 INDICATION: 72 year old male with a history of lifestyle limiting left leg claudication. EXAM: ULTRASOUND GUIDED ACCESS RIGHT COMMON FEMORAL ARTERY ANGIOGRAM PELVIS AND LEFT LOWER EXTREMITY ATHERECTOMY OF CRITICAL STENOSIS SUPERFICIAL FEMORAL ARTERY WITH POST TREATMENT WITH DRUG-ELUTING BALLOON TECHNOLOGY CLOSURE OF RIGHT COMMON FEMORAL ARTERY WITH ANGIO-SEAL MEDICATIONS: 8000 units heparin 50 mg protamine sulfate reversal ANESTHESIA/SEDATION: Moderate (conscious) sedation was employed during this procedure. A total of Versed 2.0 mg and Fentanyl 100 mcg was administered intravenously. Moderate Sedation Time: 102 minutes. The patient's level of consciousness and vital signs were monitored continuously by radiology nursing throughout the procedure under my direct supervision. CONTRAST:  95 cc IV contrast FLUOROSCOPY TIME:  Fluoroscopy Time: 16 minutes 24 seconds (89 mGy). COMPLICATIONS: None PROCEDURE: Informed consent was obtained  from the patient following explanation of the procedure, risks, benefits and alternatives. The patient understands, agrees and consents for the procedure. All questions were addressed. A time out was performed prior to the initiation of the procedure. Maximal barrier sterile technique utilized including caps, mask, sterile gowns, sterile gloves, large sterile drape, hand hygiene, and Betadine prep. Ultrasound survey of the right inguinal region was performed with images stored and sent to PACs, confirming patency of the right common femoral artery vessel. 1% lidocaine was used for local anesthesia. Eleven blade was used to make a small incision, and blunt dissection was performed using [place]. A micropuncture needle was used access the right common femoral artery under ultrasound.  With excellent arterial blood flow returned, and an .018 micro wire was passed through the needle, observed enter the abdominal aorta under fluoroscopy. The needle was removed, and a micropuncture sheath was placed over the wire. The inner dilator and wire were removed, and an 035 Bentson wire was advanced under fluoroscopy into the abdominal aorta. The sheath was removed and a standard 5 [place] vascular sheath was placed. The dilator was removed and the sheath was flushed. Omni Flush catheter was advanced over the Bentson wire to the bifurcation. Angiogram was performed of the pelvis. Bentson wire was then advanced through the Omni flush to the distal external iliac artery. Omni flush was removed an a vert catheter was placed over the wire into the distal external iliac artery. Left lower extremity angiogram was performed. Rose in wire is placed into the proximal superficial femoral artery, and a 7 French straight sheath bright tip 55 cm was placed. Once the dilator was removed, we recognized immediately a kink at the type bifurcation and this sheath was removed. A 45 cm 7 [place] braided aero sheath was then placed into the distal external  iliac artery on the Rose an wire. Rose in wire was then removed. Combination of Glidewire and the vert catheter were used to navigate through the superficial femoral artery just proximal to the critical stenosis. Glidewire in the vertebral catheter were used to navigate through the critical stenosis of the superficial femoral artery. Once on the distal aspect, wire was removed in the injection was performed to confirm luminal position. Catheter was then advanced to the popliteal artery. 7 mm spider wire was then deployed at the popliteal artery for distal protection. Catheter was removed. Atherectomy was then performed at the critical stenosis. Device was removed and repeat angiogram demonstrated adequate flow. The segment of the superficial femoral artery was initially treated with 7 mm by 60 mm drug-eluting balloon. After 3 minutes of manipulating the balloon on the wire, the balloon was not positioned at the distal aspect secondary to residual stenosis/irregular plaque. The balloon was inflated 2 minutes at this site on the proximal aspect. Upon attempting to remove the used 7 mm balloon, the balloon was lodged in the distal aspect of the sheath, likely secondary to irregularity of the wings on the balloon. Ultimately the spider wire was retrieved through the balloon catheter and rose in 035 wire was advanced through the balloon catheter into the superficial femoral artery. The balloon remained stuck in the distal sheath. Rose in wire was then exchanged for Amplatz wire. Ultimately, the balloon in the Amplatz wire were able to be freed from the distal aspect of the sheath. The balloon was gently reinflated slightly to hree configured the spent wings on the balloon, and then the balloon was aspirated with a 60 cc syringe to decompress as completely as possible. The balloon was then retrieved through the sheath on the Amplatz wire which was removed. Rose in wire was then passed into the superficial femoral artery.  The distal segment of the target lesion was then treated with a 6 mm drug-eluting balloon, overlapping at least 2 cm into normal vessel distally. Balloon was inflated for a total of 2 minutes. Balloon was removed, and final angiogram was performed with the sheath partially occluding the origin of the SFA. Palpable pulses were confirmed at the posterior tibial and anterior tibial. IV protamine sulfate was administered to reverse the heparin, sheath was withdrawn over a wire, and Angio-Seal was deployed for hemostasis. Patient tolerated the procedure  well and remained hemodynamically stable throughout. No complications were encountered and no significant blood loss. FINDINGS: Ultrasound confirms patency of the right common femoral artery with calcified plaque. Angiogram of the pelvis demonstrates no significant distal aortic plaque with patent bilateral iliac arteries with no dissection critical stenosis or significant plaque. Bilateral hypogastric arteries are patent, as well as the anterior and posterior divisions. Median sacral artery is patent. No significant plaque of the left common femoral artery. Profunda femoris, circumflex femoral arteries patent at the origin without significant stenosis. No significant plaque of the proximal superficial femoral artery. Critical stenosis of the superficial femoral artery at the adductor canal. Status post atherectomy and drug eluting balloon angioplasty there is no residual stenosis with excellent filling of the fem-pop and tibial segments. Palpable pulses at the ankle present at the completion of the case. IMPRESSION: Status post ultrasound guided access of right common femoral artery for left lower extremity angiogram and treatment of critical stenosis of the adductor canal with DARRT therapy for lifestyle limiting claudication. Status post Angio-Seal of right common femoral artery for hemostasis. Signed, Dulcy Fanny. Dellia Nims, RPVI Vascular and Interventional Radiology  Specialists [facility] Radiology Electronically Signed   By: Corrie Mckusick D.O.   On: 02/20/2018 12:59   Ir Angiogram Selective Each Additional Vessel  Result Date: 02/20/2018 INDICATION: 72 year old male with a history of lifestyle limiting left leg claudication. EXAM: ULTRASOUND GUIDED ACCESS RIGHT COMMON FEMORAL ARTERY ANGIOGRAM PELVIS AND LEFT LOWER EXTREMITY ATHERECTOMY OF CRITICAL STENOSIS SUPERFICIAL FEMORAL ARTERY WITH POST TREATMENT WITH DRUG-ELUTING BALLOON TECHNOLOGY CLOSURE OF RIGHT COMMON FEMORAL ARTERY WITH ANGIO-SEAL MEDICATIONS: 8000 units heparin 50 mg protamine sulfate reversal ANESTHESIA/SEDATION: Moderate (conscious) sedation was employed during this procedure. A total of Versed 2.0 mg and Fentanyl 100 mcg was administered intravenously. Moderate Sedation Time: 102 minutes. The patient's level of consciousness and vital signs were monitored continuously by radiology nursing throughout the procedure under my direct supervision. CONTRAST:  95 cc IV contrast FLUOROSCOPY TIME:  Fluoroscopy Time: 16 minutes 24 seconds (89 mGy). COMPLICATIONS: None PROCEDURE: Informed consent was obtained from the patient following explanation of the procedure, risks, benefits and alternatives. The patient understands, agrees and consents for the procedure. All questions were addressed. A time out was performed prior to the initiation of the procedure. Maximal barrier sterile technique utilized including caps, mask, sterile gowns, sterile gloves, large sterile drape, hand hygiene, and Betadine prep. Ultrasound survey of the right inguinal region was performed with images stored and sent to PACs, confirming patency of the right common femoral artery vessel. 1% lidocaine was used for local anesthesia. Eleven blade was used to make a small incision, and blunt dissection was performed using [place]. A micropuncture needle was used access the right common femoral artery under ultrasound. With excellent arterial blood flow  returned, and an .018 micro wire was passed through the needle, observed enter the abdominal aorta under fluoroscopy. The needle was removed, and a micropuncture sheath was placed over the wire. The inner dilator and wire were removed, and an 035 Bentson wire was advanced under fluoroscopy into the abdominal aorta. The sheath was removed and a standard 5 [place] vascular sheath was placed. The dilator was removed and the sheath was flushed. Omni Flush catheter was advanced over the Bentson wire to the bifurcation. Angiogram was performed of the pelvis. Bentson wire was then advanced through the Omni flush to the distal external iliac artery. Omni flush was removed an a vert catheter was placed over the wire into the  distal external iliac artery. Left lower extremity angiogram was performed. Rose in wire is placed into the proximal superficial femoral artery, and a 7 French straight sheath bright tip 55 cm was placed. Once the dilator was removed, we recognized immediately a kink at the type bifurcation and this sheath was removed. A 45 cm 7 [place] braided aero sheath was then placed into the distal external iliac artery on the Rose an wire. Rose in wire was then removed. Combination of Glidewire and the vert catheter were used to navigate through the superficial femoral artery just proximal to the critical stenosis. Glidewire in the vertebral catheter were used to navigate through the critical stenosis of the superficial femoral artery. Once on the distal aspect, wire was removed in the injection was performed to confirm luminal position. Catheter was then advanced to the popliteal artery. 7 mm spider wire was then deployed at the popliteal artery for distal protection. Catheter was removed. Atherectomy was then performed at the critical stenosis. Device was removed and repeat angiogram demonstrated adequate flow. The segment of the superficial femoral artery was initially treated with 7 mm by 60 mm drug-eluting  balloon. After 3 minutes of manipulating the balloon on the wire, the balloon was not positioned at the distal aspect secondary to residual stenosis/irregular plaque. The balloon was inflated 2 minutes at this site on the proximal aspect. Upon attempting to remove the used 7 mm balloon, the balloon was lodged in the distal aspect of the sheath, likely secondary to irregularity of the wings on the balloon. Ultimately the spider wire was retrieved through the balloon catheter and rose in 035 wire was advanced through the balloon catheter into the superficial femoral artery. The balloon remained stuck in the distal sheath. Rose in wire was then exchanged for Amplatz wire. Ultimately, the balloon in the Amplatz wire were able to be freed from the distal aspect of the sheath. The balloon was gently reinflated slightly to hree configured the spent wings on the balloon, and then the balloon was aspirated with a 60 cc syringe to decompress as completely as possible. The balloon was then retrieved through the sheath on the Amplatz wire which was removed. Rose in wire was then passed into the superficial femoral artery. The distal segment of the target lesion was then treated with a 6 mm drug-eluting balloon, overlapping at least 2 cm into normal vessel distally. Balloon was inflated for a total of 2 minutes. Balloon was removed, and final angiogram was performed with the sheath partially occluding the origin of the SFA. Palpable pulses were confirmed at the posterior tibial and anterior tibial. IV protamine sulfate was administered to reverse the heparin, sheath was withdrawn over a wire, and Angio-Seal was deployed for hemostasis. Patient tolerated the procedure well and remained hemodynamically stable throughout. No complications were encountered and no significant blood loss. FINDINGS: Ultrasound confirms patency of the right common femoral artery with calcified plaque. Angiogram of the pelvis demonstrates no significant  distal aortic plaque with patent bilateral iliac arteries with no dissection critical stenosis or significant plaque. Bilateral hypogastric arteries are patent, as well as the anterior and posterior divisions. Median sacral artery is patent. No significant plaque of the left common femoral artery. Profunda femoris, circumflex femoral arteries patent at the origin without significant stenosis. No significant plaque of the proximal superficial femoral artery. Critical stenosis of the superficial femoral artery at the adductor canal. Status post atherectomy and drug eluting balloon angioplasty there is no residual stenosis with excellent filling  of the fem-pop and tibial segments. Palpable pulses at the ankle present at the completion of the case. IMPRESSION: Status post ultrasound guided access of right common femoral artery for left lower extremity angiogram and treatment of critical stenosis of the adductor canal with DARRT therapy for lifestyle limiting claudication. Status post Angio-Seal of right common femoral artery for hemostasis. Signed, Dulcy Fanny. Dellia Nims, RPVI Vascular and Interventional Radiology Specialists [facility] Radiology Electronically Signed   By: Corrie Mckusick D.O.   On: 02/20/2018 12:59   US Arterial Seg Multiple Le (abi, Segmental Pressures, Pvr's)  Result Date: 03/20/2018 CLINICAL DATA:  72 year old male with a history of peripheral arterial disease, status post atherectomy and angioplasty of left superficial femoral artery critical stenosis performed 02/20/2018 EXAM: NONINVASIVE PHYSIOLOGIC VASCULAR STUDY OF BILATERAL LOWER EXTREMITIES TECHNIQUE: Evaluation of both lower extremities was performed at rest, including calculation of ankle-brachial indices, multiple segmental pressure evaluation, segmental Doppler and segmental pulse volume recording. COMPARISON:  None. FINDINGS: Right: Resting ankle brachial index:  1.0 Segmental blood pressure: Upper extremity pressures are symmetric.  Appropriate increased to the thigh. Digital pressure measures 503 systolic. Doppler: Segmental Doppler of the right lower extremity demonstrates triphasic waveform throughout Pulse volume recording: Segmental PVR of the right lower extremity demonstrates waveform quality and amplitude maintained with augmentation maintained below knee. Quality maintained at the digit. Left: Resting ankle brachial index: 1.05 Segmental blood pressure: Upper extremity pressures are symmetric. Appropriate increased to the thigh. No significant drop between segments. Digital pressure measures 888 systolic Doppler: Segmental Doppler demonstrates triphasic waveforms maintained throughout the left lower extremity. Pulse volume recording: Segmental PVR of the left lower extremity demonstrates quality and amplitude maintained with augmentation maintained below knee. Quality maintained at the digit Additional: Previous resting ABI on the left measured 0.8 on 10/04/2017 IMPRESSION: Resting ABI the bilateral lower extremities normal, with segmental exam demonstrating no significant arterial occlusive disease. Signed, Dulcy Fanny. Dellia Nims, RPVI Vascular and Interventional Radiology Specialists [facility] Radiology Electronically Signed   By: Corrie Mckusick D.O.   On: 03/20/2018 11:36   Ir Fem Imperial Pta Mod Sed  Result Date: 02/20/2018 INDICATION: 72 year old male with a history of lifestyle limiting left leg claudication. EXAM: ULTRASOUND GUIDED ACCESS RIGHT COMMON FEMORAL ARTERY ANGIOGRAM PELVIS AND LEFT LOWER EXTREMITY ATHERECTOMY OF CRITICAL STENOSIS SUPERFICIAL FEMORAL ARTERY WITH POST TREATMENT WITH DRUG-ELUTING BALLOON TECHNOLOGY CLOSURE OF RIGHT COMMON FEMORAL ARTERY WITH ANGIO-SEAL MEDICATIONS: 8000 units heparin 50 mg protamine sulfate reversal ANESTHESIA/SEDATION: Moderate (conscious) sedation was employed during this procedure. A total of Versed 2.0 mg and Fentanyl 100 mcg was administered intravenously. Moderate  Sedation Time: 102 minutes. The patient's level of consciousness and vital signs were monitored continuously by radiology nursing throughout the procedure under my direct supervision. CONTRAST:  95 cc IV contrast FLUOROSCOPY TIME:  Fluoroscopy Time: 16 minutes 24 seconds (89 mGy). COMPLICATIONS: None PROCEDURE: Informed consent was obtained from the patient following explanation of the procedure, risks, benefits and alternatives. The patient understands, agrees and consents for the procedure. All questions were addressed. A time out was performed prior to the initiation of the procedure. Maximal barrier sterile technique utilized including caps, mask, sterile gowns, sterile gloves, large sterile drape, hand hygiene, and Betadine prep. Ultrasound survey of the right inguinal region was performed with images stored and sent to PACs, confirming patency of the right common femoral artery vessel. 1% lidocaine was used for local anesthesia. Eleven blade was used to make a small incision, and blunt dissection was performed  using [place]. A micropuncture needle was used access the right common femoral artery under ultrasound. With excellent arterial blood flow returned, and an .018 micro wire was passed through the needle, observed enter the abdominal aorta under fluoroscopy. The needle was removed, and a micropuncture sheath was placed over the wire. The inner dilator and wire were removed, and an 035 Bentson wire was advanced under fluoroscopy into the abdominal aorta. The sheath was removed and a standard 5 [place] vascular sheath was placed. The dilator was removed and the sheath was flushed. Omni Flush catheter was advanced over the Bentson wire to the bifurcation. Angiogram was performed of the pelvis. Bentson wire was then advanced through the Omni flush to the distal external iliac artery. Omni flush was removed an a vert catheter was placed over the wire into the distal external iliac artery. Left lower extremity  angiogram was performed. Rose in wire is placed into the proximal superficial femoral artery, and a 7 French straight sheath bright tip 55 cm was placed. Once the dilator was removed, we recognized immediately a kink at the type bifurcation and this sheath was removed. A 45 cm 7 [place] braided aero sheath was then placed into the distal external iliac artery on the Rose an wire. Rose in wire was then removed. Combination of Glidewire and the vert catheter were used to navigate through the superficial femoral artery just proximal to the critical stenosis. Glidewire in the vertebral catheter were used to navigate through the critical stenosis of the superficial femoral artery. Once on the distal aspect, wire was removed in the injection was performed to confirm luminal position. Catheter was then advanced to the popliteal artery. 7 mm spider wire was then deployed at the popliteal artery for distal protection. Catheter was removed. Atherectomy was then performed at the critical stenosis. Device was removed and repeat angiogram demonstrated adequate flow. The segment of the superficial femoral artery was initially treated with 7 mm by 60 mm drug-eluting balloon. After 3 minutes of manipulating the balloon on the wire, the balloon was not positioned at the distal aspect secondary to residual stenosis/irregular plaque. The balloon was inflated 2 minutes at this site on the proximal aspect. Upon attempting to remove the used 7 mm balloon, the balloon was lodged in the distal aspect of the sheath, likely secondary to irregularity of the wings on the balloon. Ultimately the spider wire was retrieved through the balloon catheter and rose in 035 wire was advanced through the balloon catheter into the superficial femoral artery. The balloon remained stuck in the distal sheath. Rose in wire was then exchanged for Amplatz wire. Ultimately, the balloon in the Amplatz wire were able to be freed from the distal aspect of the  sheath. The balloon was gently reinflated slightly to hree configured the spent wings on the balloon, and then the balloon was aspirated with a 60 cc syringe to decompress as completely as possible. The balloon was then retrieved through the sheath on the Amplatz wire which was removed. Rose in wire was then passed into the superficial femoral artery. The distal segment of the target lesion was then treated with a 6 mm drug-eluting balloon, overlapping at least 2 cm into normal vessel distally. Balloon was inflated for a total of 2 minutes. Balloon was removed, and final angiogram was performed with the sheath partially occluding the origin of the SFA. Palpable pulses were confirmed at the posterior tibial and anterior tibial. IV protamine sulfate was administered to reverse the heparin, sheath  was withdrawn over a wire, and Angio-Seal was deployed for hemostasis. Patient tolerated the procedure well and remained hemodynamically stable throughout. No complications were encountered and no significant blood loss. FINDINGS: Ultrasound confirms patency of the right common femoral artery with calcified plaque. Angiogram of the pelvis demonstrates no significant distal aortic plaque with patent bilateral iliac arteries with no dissection critical stenosis or significant plaque. Bilateral hypogastric arteries are patent, as well as the anterior and posterior divisions. Median sacral artery is patent. No significant plaque of the left common femoral artery. Profunda femoris, circumflex femoral arteries patent at the origin without significant stenosis. No significant plaque of the proximal superficial femoral artery. Critical stenosis of the superficial femoral artery at the adductor canal. Status post atherectomy and drug eluting balloon angioplasty there is no residual stenosis with excellent filling of the fem-pop and tibial segments. Palpable pulses at the ankle present at the completion of the case. IMPRESSION: Status  post ultrasound guided access of right common femoral artery for left lower extremity angiogram and treatment of critical stenosis of the adductor canal with DARRT therapy for lifestyle limiting claudication. Status post Angio-Seal of right common femoral artery for hemostasis. Signed, Dulcy Fanny. Dellia Nims, RPVI [facility] Electronically Signed   By: Corrie Mckusick D.O.   On: 02/20/2018 12:59   Ir US Guide Vasc Access Right  Result Date: 02/20/2018 INDICATION: 72 year old male with a history of lifestyle limiting left leg claudication. EXAM: ULTRASOUND GUIDED ACCESS RIGHT COMMON FEMORAL ARTERY ANGIOGRAM PELVIS AND LEFT LOWER EXTREMITY ATHERECTOMY OF CRITICAL STENOSIS SUPERFICIAL FEMORAL ARTERY WITH POST TREATMENT WITH DRUG-ELUTING BALLOON TECHNOLOGY CLOSURE OF RIGHT COMMON FEMORAL ARTERY WITH ANGIO-SEAL MEDICATIONS: 8000 units heparin 50 mg protamine sulfate reversal ANESTHESIA/SEDATION: Moderate (conscious) sedation was employed during this procedure. A total of Versed 2.0 mg and Fentanyl 100 mcg was administered intravenously. Moderate Sedation Time: 102 minutes. The patient's level of consciousness and vital signs were monitored continuously by radiology nursing throughout the procedure under my direct supervision. CONTRAST:  95 cc IV contrast FLUOROSCOPY TIME:  Fluoroscopy Time: 16 minutes 24 seconds (89 mGy). COMPLICATIONS: None PROCEDURE: Informed consent was obtained from the patient following explanation of the procedure, risks, benefits and alternatives. The patient understands, agrees and consents for the procedure. All questions were addressed. A time out was performed prior to the initiation of the procedure. Maximal barrier sterile technique utilized including caps, mask, sterile gowns, sterile gloves, large sterile drape, hand hygiene, and Betadine prep. Ultrasound survey of the right inguinal region was performed with images stored and sent  to PACs, confirming patency of the right common femoral artery vessel. 1% lidocaine was used for local anesthesia. Eleven blade was used to make a small incision, and blunt dissection was performed using [place]. A micropuncture needle was used access the right common femoral artery under ultrasound. With excellent arterial blood flow returned, and an .018 micro wire was passed through the needle, observed enter the abdominal aorta under fluoroscopy. The needle was removed, and a micropuncture sheath was placed over the wire. The inner dilator and wire were removed, and an 035 Bentson wire was advanced under fluoroscopy into the abdominal aorta. The sheath was removed and a standard 5 [place] vascular sheath was placed. The dilator was removed and the sheath was flushed. Omni Flush catheter was advanced over the Bentson wire to the bifurcation. Angiogram was performed of the pelvis. Bentson wire was then advanced through the Omni flush to the distal external iliac artery.  Omni flush was removed an a vert catheter was placed over the wire into the distal external iliac artery. Left lower extremity angiogram was performed. Rose in wire is placed into the proximal superficial femoral artery, and a 7 French straight sheath bright tip 55 cm was placed. Once the dilator was removed, we recognized immediately a kink at the type bifurcation and this sheath was removed. A 45 cm 7 [place] braided aero sheath was then placed into the distal external iliac artery on the Rose an wire. Rose in wire was then removed. Combination of Glidewire and the vert catheter were used to navigate through the superficial femoral artery just proximal to the critical stenosis. Glidewire in the vertebral catheter were used to navigate through the critical stenosis of the superficial femoral artery. Once on the distal aspect, wire was removed in the injection was performed to confirm luminal position. Catheter was then advanced to the popliteal artery. 7  mm spider wire was then deployed at the popliteal artery for distal protection. Catheter was removed. Atherectomy was then performed at the critical stenosis. Device was removed and repeat angiogram demonstrated adequate flow. The segment of the superficial femoral artery was initially treated with 7 mm by 60 mm drug-eluting balloon. After 3 minutes of manipulating the balloon on the wire, the balloon was not positioned at the distal aspect secondary to residual stenosis/irregular plaque. The balloon was inflated 2 minutes at this site on the proximal aspect. Upon attempting to remove the used 7 mm balloon, the balloon was lodged in the distal aspect of the sheath, likely secondary to irregularity of the wings on the balloon. Ultimately the spider wire was retrieved through the balloon catheter and rose in 035 wire was advanced through the balloon catheter into the superficial femoral artery. The balloon remained stuck in the distal sheath. Rose in wire was then exchanged for Amplatz wire. Ultimately, the balloon in the Amplatz wire were able to be freed from the distal aspect of the sheath. The balloon was gently reinflated slightly to hree configured the spent wings on the balloon, and then the balloon was aspirated with a 60 cc syringe to decompress as completely as possible. The balloon was then retrieved through the sheath on the Amplatz wire which was removed. Rose in wire was then passed into the superficial femoral artery. The distal segment of the target lesion was then treated with a 6 mm drug-eluting balloon, overlapping at least 2 cm into normal vessel distally. Balloon was inflated for a total of 2 minutes. Balloon was removed, and final angiogram was performed with the sheath partially occluding the origin of the SFA. Palpable pulses were confirmed at the posterior tibial and anterior tibial. IV protamine sulfate was administered to reverse the heparin, sheath was withdrawn over a wire, and Angio-Seal  was deployed for hemostasis. Patient tolerated the procedure well and remained hemodynamically stable throughout. No complications were encountered and no significant blood loss. FINDINGS: Ultrasound confirms patency of the right common femoral artery with calcified plaque. Angiogram of the pelvis demonstrates no significant distal aortic plaque with patent bilateral iliac arteries with no dissection critical stenosis or significant plaque. Bilateral hypogastric arteries are patent, as well as the anterior and posterior divisions. Median sacral artery is patent. No significant plaque of the left common femoral artery. Profunda femoris, circumflex femoral arteries patent at the origin without significant stenosis. No significant plaque of the proximal superficial femoral artery. Critical stenosis of the superficial femoral artery at the adductor canal. Status  post atherectomy and drug eluting balloon angioplasty there is no residual stenosis with excellent filling of the fem-pop and tibial segments. Palpable pulses at the ankle present at the completion of the case. IMPRESSION: Status post ultrasound guided access of right common femoral artery for left lower extremity angiogram and treatment of critical stenosis of the adductor canal with DARRT therapy for lifestyle limiting claudication. Status post Angio-Seal of right common femoral artery for hemostasis. Signed, Dulcy Fanny. Dellia Nims, RPVI [facility] Radiology Electronically Signed   By: Corrie Mckusick D.O.   On: 02/20/2018 12:59    Labs:  CBC: Recent Labs    02/20/18 0805  WBC 7.5  HGB 14.6  HCT 45.7  PLT 309    COAGS: Recent Labs    02/20/18 0805  INR 1.03  APTT 31    BMP: Recent Labs    01/16/18 1444  CREATININE 1.30*    LIVER FUNCTION TESTS: No results for input(s): BILITOT, AST, ALT, ALKPHOS, PROT, ALBUMIN in the last 8760 hours.  TUMOR MARKERS: No results for input(s): AFPTM,  CEA, CA199, CHROMGRNA in the last 8760 hours.  Assessment and Plan:  Michael Utz is a 72 year old male with PAD, SP treatment of significant lifestyle limiting claudication of the LLE with directional atherectomy and drug-eluding technology, 02/20/2018.   He has had complete resolution of his symptoms, and he has returned comfortably to working out at [employer], as well as his other daily activities.    He has normalization of his ABI/non-invasive study/segmental exam.   He continues to take dual anti-platelet medication with ASA and plavix.  We will ask him to take the plavix for just 2 more months.    Plan is to see him back for follow up in 3 months, with office visit only.   Plan: - Follow up office visit in ~3 months, with no need for repeat non-invasive exam.  - Continue plavix for 2 more months from today, then stop, and continue 70m ASA indefinite - Continue other maximal medical therapy - I have advised him to observe his appointments with other providers.    Electronically Signed: WCorrie Mckusick8/13/2019, 1:22 PM   I spent a total of    25 Minutes in face to face in clinical consultation, greater than 50% of which was counseling/coordinating care for PAD, left claudication, SP directional atherectomy and angioplasty.

## 2018-03-20 NOTE — Assessment & Plan Note (Addendum)
Rechecking hemoglobin A1c, previously 8.7%. A1c is down to 7.4%, increasing glipizide to 10 mg twice daily, continue metformin 1000 mg twice a day.

## 2018-03-20 NOTE — Assessment & Plan Note (Signed)
Symptoms better after nighttime splinting, he does have some recurrence of pain when lifting weights, specifically bicep curl, I advised him to wear his splint during exercise, and if persistent symptoms at the follow-up visit we will do a median nerve hydrodissection on the left.

## 2018-03-20 NOTE — Assessment & Plan Note (Signed)
Well-controlled, no changes needed

## 2018-03-20 NOTE — Assessment & Plan Note (Signed)
Chronic nonhealing lesion on the right lateral elbow, return for shave biopsy. He also has dermatitis on the left elbow, triamcinolone cream called in.

## 2018-03-28 ENCOUNTER — Other Ambulatory Visit: Payer: Self-pay | Admitting: Sports Medicine

## 2018-03-28 DIAGNOSIS — I739 Peripheral vascular disease, unspecified: Secondary | ICD-10-CM

## 2018-04-04 ENCOUNTER — Encounter: Payer: Self-pay | Admitting: Family Medicine

## 2018-04-04 ENCOUNTER — Ambulatory Visit (INDEPENDENT_AMBULATORY_CARE_PROVIDER_SITE_OTHER): Payer: PPO | Admitting: Family Medicine

## 2018-04-04 VITALS — BP 124/83 | HR 79 | Temp 98.1°F | Wt 139.0 lb

## 2018-04-04 DIAGNOSIS — R51 Headache: Secondary | ICD-10-CM | POA: Diagnosis not present

## 2018-04-04 DIAGNOSIS — R519 Headache, unspecified: Secondary | ICD-10-CM

## 2018-04-04 DIAGNOSIS — R5383 Other fatigue: Secondary | ICD-10-CM

## 2018-04-04 DIAGNOSIS — R6883 Chills (without fever): Secondary | ICD-10-CM | POA: Diagnosis not present

## 2018-04-04 DIAGNOSIS — M791 Myalgia, unspecified site: Secondary | ICD-10-CM

## 2018-04-04 LAB — CK: CK TOTAL: 56 U/L (ref 44–196)

## 2018-04-04 LAB — CBC WITH DIFFERENTIAL/PLATELET
Basophils Absolute: 40 cells/uL (ref 0–200)
Basophils Relative: 0.5 %
Eosinophils Absolute: 144 cells/uL (ref 15–500)
Eosinophils Relative: 1.8 %
HCT: 40.6 % (ref 38.5–50.0)
Hemoglobin: 13.8 g/dL (ref 13.2–17.1)
Lymphs Abs: 2032 cells/uL (ref 850–3900)
MCH: 28 pg (ref 27.0–33.0)
MCHC: 34 g/dL (ref 32.0–36.0)
MCV: 82.4 fL (ref 80.0–100.0)
MONOS PCT: 6.9 %
MPV: 8.6 fL (ref 7.5–12.5)
NEUTROS PCT: 65.4 %
Neutro Abs: 5232 cells/uL (ref 1500–7800)
Platelets: 385 10*3/uL (ref 140–400)
RBC: 4.93 10*6/uL (ref 4.20–5.80)
RDW: 13.1 % (ref 11.0–15.0)
Total Lymphocyte: 25.4 %
WBC: 8 10*3/uL (ref 3.8–10.8)
WBCMIX: 552 {cells}/uL (ref 200–950)

## 2018-04-04 LAB — COMPLETE METABOLIC PANEL WITH GFR
AG RATIO: 1.6 (calc) (ref 1.0–2.5)
ALBUMIN MSPROF: 4.2 g/dL (ref 3.6–5.1)
ALT: 12 U/L (ref 9–46)
AST: 11 U/L (ref 10–35)
Alkaline phosphatase (APISO): 53 U/L (ref 40–115)
BUN / CREAT RATIO: 18 (calc) (ref 6–22)
BUN: 26 mg/dL — ABNORMAL HIGH (ref 7–25)
CO2: 27 mmol/L (ref 20–32)
CREATININE: 1.44 mg/dL — AB (ref 0.70–1.18)
Calcium: 10.2 mg/dL (ref 8.6–10.3)
Chloride: 100 mmol/L (ref 98–110)
GFR, Est African American: 55 mL/min/{1.73_m2} — ABNORMAL LOW (ref >=60)
GFR, Est Non African American: 47 mL/min/{1.73_m2} — ABNORMAL LOW (ref >=60)
GLOBULIN: 2.7 g/dL (ref 1.9–3.7)
Glucose, Bld: 144 mg/dL — ABNORMAL HIGH (ref 65–99)
POTASSIUM: 4.9 mmol/L (ref 3.5–5.3)
SODIUM: 137 mmol/L (ref 135–146)
Total Bilirubin: 0.3 mg/dL (ref 0.2–1.2)
Total Protein: 6.9 g/dL (ref 6.1–8.1)

## 2018-04-04 MED ORDER — CEFDINIR 300 MG PO CAPS: 300.0000 mg | ORAL_CAPSULE | Freq: Two times a day (BID) | ORAL | 0 refills | Status: DC

## 2018-04-04 NOTE — Progress Notes (Signed)
Michael Thomas is a 72 y.o. male who presents to [facility] [place] today for fever, chills., body aches and rigors.   Michael Thomas had a dental extraction on 03/28/18 and was prescribed amoxicillin. He developed fever, chills body aches and rigors the next day. He stopped the amoxicillin on the 25th. He is feeling a bit better but is still feeling poorly. He had a similar episode with amoxicillin in the past. He denies any vomiting or diarrhea. He denies any rash or trouble breathing. He has tried some OTC ibuprofen which helped a bit. He does note left sided face and jaw pain. He had the dental extraction on the right upper jaw.  He is eating and drinking well and aside from the fatigue fevers and chills feels reasonably well otherwise.  No urinary symptoms.   ROS as above:  Exam:  BP 124/83   Pulse 79   Temp 98.1 F (36.7 C) (Oral)   Wt 139 lb (63 kg)   BMI 26.26 kg/m  Wt Readings from Last 5 Encounters:  04/04/18 139 lb (63 kg)  03/20/18 140 lb (63.5 kg)  02/20/18 140 lb (63.5 kg)  02/01/18 138 lb (62.6 kg)  01/30/18 143 lb (64.9 kg)    Gen: Well NAD HEENT: EOMI,  MMM patient with partial molar or wisdom tooth in the left lower jaw embedded in the gum.  This is minimally tender to palpation.  Socket right upper posterior molar is nontender.  No significant jaw swelling or soft tissue swelling into the neck.  No cystic severe cervical lymphadenopathy.  Normal tympanic membranes bilaterally.  Maxillary and frontal sinuses are nontender.  Normal neck motion. Lungs: Normal work of breathing. CTABL Heart: RRR no MRG Abd: NABS, Soft. Nondistended, Nontender Exts: Brisk capillary refill, warm and well perfused.  Pulses capillary refill and sensation are intact left lower extremity    Assessment and Plan: 72 y.o. male with fevers chills muscle soreness and facial pain  following dental extraction and exposure to amoxicillin.  Patient is already discontinued the amoxicillin.  Plan to obtain basic labs listed below and start empiric treatment with Omnicef.  Recheck in 1 to 2 weeks.  Return sooner if needed.  Etiology is somewhat unclear.  Concern for facial infection or dental infection.   Orders Placed This Encounter  Procedures  . COMPLETE METABOLIC PANEL WITH GFR  . CBC with Differential/Platelet  . CK   Meds ordered this encounter  Medications  . cefdinir (OMNICEF) 300 MG capsule    Sig: Take 1 capsule (300 mg total) by mouth 2 (two) times daily.    Dispense:  14 capsule    Refill:  0     Historical information moved to improve visibility of documentation.  Past Medical History:  Diagnosis Date  . Benign prostatic hyperplasia with urinary obstruction 03/25/2015  . Diabetes mellitus without complication ([place])   . Hyperlipidemia   . Kidney stone   . OSA (obstructive sleep apnea)   . Vitamin D deficiency    Past Surgical History:  Procedure Laterality Date  . DG ARTHRO THUMB*L*    . IR ANGIOGRAM EXTREMITY BILATERAL  02/20/2018  . IR ANGIOGRAM SELECTIVE EACH ADDITIONAL VESSEL  02/20/2018  . IR FEM POP ART ATHERECT INC PTA MOD SED  02/20/2018  . IR RADIOLOGIST EVAL & MGMT  02/01/2018  . IR RADIOLOGIST EVAL & MGMT  03/20/2018  . IR US GUIDE VASC ACCESS RIGHT  02/20/2018  .  NECK SURGERY    . VASECTOMY     Social History   Tobacco Use  . Smoking status: Never Smoker  . Smokeless tobacco: Never Used  Substance Use Topics  . Alcohol use: No   family history includes Diabetes Mellitus II in his mother; Stroke in his mother.  Medications: Current Outpatient Medications  Medication Sig Dispense Refill  . aspirin EC 81 MG tablet Take 81 mg by mouth daily.     . cilostazol (PLETAL) 100 MG tablet TAKE 1 TABLET BY MOUTH TWICE A DAY 180 tablet 1  . clopidogrel (PLAVIX) 75 MG tablet Take 75 mg by mouth daily.    Marland Kitchen glipiZIDE (GLUCOTROL) 5 MG tablet  Take 1 tablet (5 mg total) by mouth 2 (two) times daily before a meal. 180 tablet 3  . lisinopril (PRINIVIL,ZESTRIL) 10 MG tablet Take 10 mg by mouth daily.    . magnesium oxide (MAG-OX) 400 MG tablet Take 400 mg by mouth daily.     . metFORMIN (GLUCOPHAGE) 1000 MG tablet Take 1,000 mg by mouth 2 (two) times daily with a meal.     . simvastatin (ZOCOR) 40 MG tablet Take 40 mg by mouth every evening.    . tamsulosin (FLOMAX) 0.4 MG CAPS capsule Take 0.4 mg by mouth daily.     Marland Kitchen triamcinolone cream (KENALOG) 0.5 % Apply 1 application topically 2 (two) times daily. To affected areas. 30 g 3  . cefdinir (OMNICEF) 300 MG capsule Take 1 capsule (300 mg total) by mouth 2 (two) times daily. 14 capsule 0   No current facility-administered medications for this visit.    Allergies  Allergen Reactions  . Amoxicillin Other (See Comments)    Fatigue and Myalgia and Rigors x2 attempts     Discussed warning signs or symptoms. Please see discharge instructions. Patient expresses understanding.

## 2018-04-04 NOTE — Patient Instructions (Addendum)
Thank you for coming in today. STOP amoxicillin  Start omnicef twice daily.  Get labs now.  Recheck if not getting better.  Continue tylenol or ibuprofen for fever, chills and aches.     Schedule nurse visit for 1 month for flu vaccine.

## 2018-05-16 ENCOUNTER — Other Ambulatory Visit: Payer: Self-pay | Admitting: Interventional Radiology

## 2018-05-16 DIAGNOSIS — I70219 Atherosclerosis of native arteries of extremities with intermittent claudication, unspecified extremity: Secondary | ICD-10-CM

## 2018-06-13 ENCOUNTER — Other Ambulatory Visit: Payer: Self-pay

## 2018-07-12 ENCOUNTER — Other Ambulatory Visit: Payer: Self-pay | Admitting: Sports Medicine

## 2018-07-12 MED ORDER — SIMVASTATIN 40 MG PO TABS: 40.0000 mg | ORAL_TABLET | Freq: Every day | ORAL | 3 refills | Status: DC

## 2018-07-25 ENCOUNTER — Ambulatory Visit (INDEPENDENT_AMBULATORY_CARE_PROVIDER_SITE_OTHER): Payer: PPO | Admitting: Sports Medicine

## 2018-07-25 ENCOUNTER — Encounter: Payer: Self-pay | Admitting: Sports Medicine

## 2018-07-25 ENCOUNTER — Ambulatory Visit (INDEPENDENT_AMBULATORY_CARE_PROVIDER_SITE_OTHER): Payer: PPO

## 2018-07-25 ENCOUNTER — Ambulatory Visit
Admission: RE | Admit: 2018-07-25 | Discharge: 2018-07-25 | Disposition: A | Discharge status: Home / Self Care | Payer: PPO | Source: Ambulatory Visit | Attending: Interventional Radiology | Admitting: Interventional Radiology

## 2018-07-25 DIAGNOSIS — E1169 Type 2 diabetes mellitus with other specified complication: Secondary | ICD-10-CM | POA: Diagnosis not present

## 2018-07-25 DIAGNOSIS — M170 Bilateral primary osteoarthritis of knee: Secondary | ICD-10-CM | POA: Diagnosis not present

## 2018-07-25 DIAGNOSIS — Z114 Encounter for screening for human immunodeficiency virus [HIV]: Secondary | ICD-10-CM

## 2018-07-25 DIAGNOSIS — Z9889 Other specified postprocedural states: Secondary | ICD-10-CM | POA: Diagnosis not present

## 2018-07-25 DIAGNOSIS — I10 Essential (primary) hypertension: Secondary | ICD-10-CM | POA: Diagnosis not present

## 2018-07-25 DIAGNOSIS — E785 Hyperlipidemia, unspecified: Secondary | ICD-10-CM | POA: Diagnosis not present

## 2018-07-25 DIAGNOSIS — E118 Type 2 diabetes mellitus with unspecified complications: Secondary | ICD-10-CM

## 2018-07-25 DIAGNOSIS — Z0000 Encounter for general adult medical examination without abnormal findings: Secondary | ICD-10-CM | POA: Diagnosis not present

## 2018-07-25 DIAGNOSIS — N401 Enlarged prostate with lower urinary tract symptoms: Secondary | ICD-10-CM | POA: Diagnosis not present

## 2018-07-25 DIAGNOSIS — D229 Melanocytic nevi, unspecified: Secondary | ICD-10-CM | POA: Diagnosis not present

## 2018-07-25 DIAGNOSIS — I70219 Atherosclerosis of native arteries of extremities with intermittent claudication, unspecified extremity: Secondary | ICD-10-CM

## 2018-07-25 DIAGNOSIS — I739 Peripheral vascular disease, unspecified: Secondary | ICD-10-CM | POA: Diagnosis not present

## 2018-07-25 DIAGNOSIS — M1711 Unilateral primary osteoarthritis, right knee: Secondary | ICD-10-CM | POA: Diagnosis not present

## 2018-07-25 DIAGNOSIS — D2361 Other benign neoplasm of skin of right upper limb, including shoulder: Secondary | ICD-10-CM | POA: Diagnosis not present

## 2018-07-25 DIAGNOSIS — M1712 Unilateral primary osteoarthritis, left knee: Secondary | ICD-10-CM | POA: Diagnosis not present

## 2018-07-25 DIAGNOSIS — N138 Other obstructive and reflux uropathy: Secondary | ICD-10-CM | POA: Diagnosis not present

## 2018-07-25 HISTORY — PX: IR RADIOLOGIST EVAL & MGMT: IMG5224

## 2018-07-25 LAB — POCT GLYCOSYLATED HEMOGLOBIN (HGB A1C): Hemoglobin A1C: 8.2 % — AB (ref 4.0–5.6)

## 2018-07-25 MED ORDER — GLIPIZIDE 10 MG PO TABS: 10.0000 mg | ORAL_TABLET | Freq: Two times a day (BID) | ORAL | 3 refills | Status: DC

## 2018-07-25 MED ORDER — MELOXICAM 15 MG PO TABS: ORAL_TABLET | ORAL | 3 refills | Status: DC

## 2018-07-25 MED ORDER — SITAGLIPTIN PHOSPHATE 50 MG PO TABS: 50.0000 mg | ORAL_TABLET | Freq: Every day | ORAL | 11 refills | Status: DC

## 2018-07-25 NOTE — Assessment & Plan Note (Signed)
Well-controlled, no change in plan.

## 2018-07-25 NOTE — Assessment & Plan Note (Signed)
A1c elevated above 8. Increasing glipizide to 10 mg twice daily Adding Januvia. Return in 3 months for an A1c check.

## 2018-07-25 NOTE — Assessment & Plan Note (Signed)
Shave biopsy as above

## 2018-07-25 NOTE — Assessment & Plan Note (Signed)
Rechecking PSA.

## 2018-07-25 NOTE — Assessment & Plan Note (Signed)
Adding HIV and hep C screening. Declines flu shot.

## 2018-07-25 NOTE — Assessment & Plan Note (Signed)
Off of blood thinners, starting meloxicam, rehab exercises given, baseline x-rays. If no better in a month we will inject both knees.

## 2018-07-25 NOTE — Progress Notes (Addendum)
Subjective:    CC: Follow-up  HPI: Claudication: Michael Thomas is a pleasant 72 year old male, he had a arterial angioplasty with IR and all of his claudication symptoms resolved.  Diabetes mellitus type 2: Here for follow-up of his diabetes, previous A1c was 7.4% 3 months ago.  Blood pressure is normal.  Benign essential hypertension: Controlled.  Suspicious nevus: Present on his right upper outer arm, tends to bleed, would like this to be evaluated.  I reviewed the past medical history, family history, social history, surgical history, and allergies today and no changes were needed.  Please see the problem list section below in epic for further details.  Past Medical History: Past Medical History:  Diagnosis Date  . Benign prostatic hyperplasia with urinary obstruction 03/25/2015  . Diabetes mellitus without complication ([place])   . Hyperlipidemia   . Kidney stone   . OSA (obstructive sleep apnea)   . Vitamin D deficiency    Past Surgical History: Past Surgical History:  Procedure Laterality Date  . DG ARTHRO THUMB*L*    . IR ANGIOGRAM EXTREMITY BILATERAL  02/20/2018  . IR ANGIOGRAM SELECTIVE EACH ADDITIONAL VESSEL  02/20/2018  . IR FEM POP ART ATHERECT INC PTA MOD SED  02/20/2018  . IR RADIOLOGIST EVAL & MGMT  02/01/2018  . IR RADIOLOGIST EVAL & MGMT  03/20/2018  . IR RADIOLOGIST EVAL & MGMT  07/25/2018  . IR US GUIDE VASC ACCESS RIGHT  02/20/2018  . NECK SURGERY    . VASECTOMY     Social History: Social History   Socioeconomic History  . Marital status: Married    Spouse name: Not on file  . Number of children: Not on file  . Years of education: Not on file  . Highest education level: Not on file  Occupational History  . Not on file  Social Needs  . Financial resource strain: Not on file  . Food insecurity:    Worry: Not on file    Inability: Not on file  . Transportation needs:    Medical: Not on file    Non-medical: Not on file  Tobacco Use  . Smoking status: Never  Smoker  . Smokeless tobacco: Never Used  Substance and Sexual Activity  . Alcohol use: No  . Drug use: No  . Sexual activity: Yes    Birth control/protection: None  Lifestyle  . Physical activity:    Days per week: Not on file    Minutes per session: Not on file  . Stress: Not on file  Relationships  . Social connections:    Talks on phone: Not on file    Gets together: Not on file    Attends religious service: Not on file    Active member of club or organization: Not on file    Attends meetings of clubs or organizations: Not on file    Relationship status: Not on file  Other Topics Concern  . Not on file  Social History Narrative  . Not on file   Family History: Family History  Problem Relation Age of Onset  . Stroke Mother   . Diabetes Mellitus II Mother   . Prostate cancer Neg Hx   . Kidney cancer Neg Hx   . Bladder Cancer Neg Hx    Allergies: Allergies  Allergen Reactions  . Amoxicillin Other (See Comments)    Fatigue and Myalgia and Rigors x2 attempts   Medications: See med rec.  Review of Systems: No fevers, chills, night sweats, weight loss, chest pain,  or shortness of breath.   Objective:    General: Well Developed, well nourished, and in no acute distress.  Neuro: Alert and oriented x3, extra-ocular muscles intact, sensation grossly intact.  HEENT: Normocephalic, atraumatic, pupils equal round reactive to light, neck supple, no masses, no lymphadenopathy, thyroid nonpalpable.  Skin: Warm and dry, no rashes.  There is a 0.6 cm variegated, irregular nevus on his right upper outer arm. Cardiac: Regular rate and rhythm, no murmurs rubs or gallops, no lower extremity edema.  Respiratory: Clear to auscultation bilaterally. Not using accessory muscles, speaking in full sentences.  Procedure:  Excision of 0.6 cm right upper outer arm suspicious nevus Risks, benefits, and alternatives explained and consent obtained. Time out conducted. Surface prepped with  alcohol. 2cc lidocaine with epinephine infiltrated in a field block. Adequate anesthesia ensured. Area prepped and draped in a sterile fashion. Excision performed with: Using a derma blade I made a shave down into the deep dermis.  No further lesion was seen on the dermis itself, I then used a Hyfrecator to aggressively achieve hemostasis and destroyed all of the underlying tissue. Pt stable.  Impression and Recommendations:    Benign prostatic hyperplasia with urinary obstruction Rechecking PSA  Annual physical exam Adding HIV and hep C screening. Declines flu shot.  Essential hypertension, benign Well-controlled, no change in plan.  Diabetes mellitus, type 2 (HCC) A1c elevated above 8. Increasing glipizide to 10 mg twice daily Adding Januvia. Return in 3 months for an A1c check.  Suspicious nevus Shave biopsy as above  Primary osteoarthritis of both knees Off of blood thinners, starting meloxicam, rehab exercises given, baseline x-rays. If no better in a month we will inject both knees.  Hyperlipidemia Labs all look ok except lipids, most are good except trigs, we need to add fenofibrate AND lovaza considering they are almost 500.  We can recheck fasting in 2 months. Orders placed for everything   Screening for HIV (human immunodeficiency virus) HIV test came back positive, this looks more like a false positive so to confirm we have to do an HIV RNA quantitative test.  He should return to the lab to have this drawn.  I'll place the orders.   ___________________________________________ Gwen Her. Dianah Field, M.D., ABFM., CAQSM. Primary Care and Sports Medicine [facility]  Adjunct Professor of [place] [employer]

## 2018-07-25 NOTE — Progress Notes (Signed)
Chief Complaint: PAD  Referring Physician(s): Dr. Dianah Field  History of Present Illness: Michael Thomas is a 72 y.o. male presenting as a scheduled surveillance appointment to [place], SP treatment of symptomatic left SFA lesion, for Rutherford 3 class symptoms.   We first met Michael Thomas in June of 2019, and we treated him 02/20/2018.  Today is our second follow up visit.   He tells me he is quite satisfied with his symptom relief and has no symptoms with ambulation.  He recently was in [place] overseas with his wife for extended vacation/travel, and did much walking, all without symptoms.  He seems quite pleased.    He does state that when he runs on the treadmill at the gym he has bilateral calf pain which he is attributing to run-of-the-mill MSK pain or deconditioning.  He has no interval wounds.    He continues with maximal medical therapy, and has now stopped the plavix.    His non-invasive exam performed in august shows normalization of the ABI bilateral.    Past Medical History:  Diagnosis Date  . Benign prostatic hyperplasia with urinary obstruction 03/25/2015  . Diabetes mellitus without complication ([place])   . Hyperlipidemia   . Kidney stone   . OSA (obstructive sleep apnea)   . Vitamin D deficiency     Past Surgical History:  Procedure Laterality Date  . DG ARTHRO THUMB*L*    . IR ANGIOGRAM EXTREMITY BILATERAL  02/20/2018  . IR ANGIOGRAM SELECTIVE EACH ADDITIONAL VESSEL  02/20/2018  . IR FEM POP ART ATHERECT INC PTA MOD SED  02/20/2018  . IR RADIOLOGIST EVAL & MGMT  02/01/2018  . IR RADIOLOGIST EVAL & MGMT  03/20/2018  . IR RADIOLOGIST EVAL & MGMT  07/25/2018  . IR US GUIDE VASC ACCESS RIGHT  02/20/2018  . NECK SURGERY    . VASECTOMY      Allergies: Amoxicillin  Medications: Prior to Admission medications   Medication Sig Start Date End Date Taking? Authorizing Provider  aspirin EC 81 MG tablet Take 81 mg by mouth daily.    Yes [provider]    glipiZIDE (GLUCOTROL) 10 MG tablet Take 1 tablet (10 mg total) by mouth 2 (two) times daily before a meal. 07/25/18  Yes Silverio Decamp, MD  lisinopril (PRINIVIL,ZESTRIL) 10 MG tablet Take 10 mg by mouth daily.   Yes [provider]  magnesium oxide (MAG-OX) 400 MG tablet Take 400 mg by mouth daily.    Yes [provider]  metFORMIN (GLUCOPHAGE) 1000 MG tablet Take 1,000 mg by mouth 2 (two) times daily with a meal.  10/01/17  Yes [provider]  simvastatin (ZOCOR) 40 MG tablet Take 1 tablet (40 mg total) by mouth daily at 6 PM. 07/12/18  Yes Silverio Decamp, MD  sitaGLIPtin (JANUVIA) 50 MG tablet Take 1 tablet (50 mg total) by mouth daily. 07/25/18  Yes Silverio Decamp, MD  tamsulosin (FLOMAX) 0.4 MG CAPS capsule Take 0.4 mg by mouth daily.  11/20/17  Yes [provider]  meloxicam (MOBIC) 15 MG tablet One tab PO qAM with breakfast for 2 weeks, then daily prn pain. Patient not taking: Reported on 07/25/2018 07/25/18   Silverio Decamp, MD     Family History  Problem Relation Age of Onset  . Stroke Mother   . Diabetes Mellitus II Mother   . Prostate cancer Neg Hx   . Kidney cancer Neg Hx   . Bladder Cancer Neg Hx  Social History   Socioeconomic History  . Marital status: Married    Spouse name: Not on file  . Number of children: Not on file  . Years of education: Not on file  . Highest education level: Not on file  Occupational History  . Not on file  Social Needs  . Financial resource strain: Not on file  . Food insecurity:    Worry: Not on file    Inability: Not on file  . Transportation needs:    Medical: Not on file    Non-medical: Not on file  Tobacco Use  . Smoking status: Never Smoker  . Smokeless tobacco: Never Used  Substance and Sexual Activity  . Alcohol use: No  . Drug use: No  . Sexual activity: Yes    Birth control/protection: None  Lifestyle  . Physical activity:    Days per week: Not on  file    Minutes per session: Not on file  . Stress: Not on file  Relationships  . Social connections:    Talks on phone: Not on file    Gets together: Not on file    Attends religious service: Not on file    Active member of club or organization: Not on file    Attends meetings of clubs or organizations: Not on file    Relationship status: Not on file  Other Topics Concern  . Not on file  Social History Narrative  . Not on file       Review of Systems: A 12 point ROS discussed and pertinent positives are indicated in the HPI above.  All other systems are negative.  Review of Systems  Vital Signs: BP 133/74   Pulse 74   Temp 98.1 F (36.7 C) (Oral)   Resp 15   Ht '5\' 1"'  (1.549 m)   Wt 63.5 kg   SpO2 98%   BMI 26.45 kg/m   Physical Exam Targeted exam of the feet demonstrates palpable and dopper + PT and DP both feet.  No intertriginous wounds.    Mallampati Score:     Imaging: Dg Knee Complete 4 Views Left  Result Date: 07/25/2018 CLINICAL DATA:  Chronic bilateral knee pain.  No known injury. EXAM: LEFT KNEE - COMPLETE 4+ VIEW; RIGHT KNEE - COMPLETE 4+ VIEW COMPARISON:  None. FINDINGS: No evidence of fracture, dislocation, or joint effusion. Mild patellofemoral osteophytosis is noted. No chondrocalcinosis. Soft tissues are unremarkable. IMPRESSION: No acute abnormality. Mild bilateral patellofemoral osteoarthritis. Electronically Signed   By: Inge Rise M.D.   On: 07/25/2018 15:31   Dg Knee Complete 4 Views Right  Result Date: 07/25/2018 CLINICAL DATA:  Chronic bilateral knee pain.  No known injury. EXAM: LEFT KNEE - COMPLETE 4+ VIEW; RIGHT KNEE - COMPLETE 4+ VIEW COMPARISON:  None. FINDINGS: No evidence of fracture, dislocation, or joint effusion. Mild patellofemoral osteophytosis is noted. No chondrocalcinosis. Soft tissues are unremarkable. IMPRESSION: No acute abnormality. Mild bilateral patellofemoral osteoarthritis. Electronically Signed   By: Inge Rise M.D.   On: 07/25/2018 15:31   Ir Radiologist Eval & Mgmt  Result Date: 07/25/2018 Please refer to notes tab for details about interventional procedure. (Op Note)   Labs:  CBC: Recent Labs    02/20/18 0805 04/04/18 1354  WBC 7.5 8.0  HGB 14.6 13.8  HCT 45.7 40.6  PLT 309 385    COAGS: Recent Labs    02/20/18 0805  INR 1.03  APTT 31    BMP: Recent Labs  01/16/18 1444 04/04/18 1354  NA  --  137  K  --  4.9  CL  --  100  CO2  --  27  GLUCOSE  --  144*  BUN  --  26*  CALCIUM  --  10.2  CREATININE 1.30* 1.44*  GFRNONAA  --  47*  GFRAA  --  55*    LIVER FUNCTION TESTS: Recent Labs    04/04/18 1354  BILITOT 0.3  AST 11  ALT 12  PROT 6.9    TUMOR MARKERS: No results for input(s): AFPTM, CEA, CA199, CHROMGRNA in the last 8760 hours.  Assessment and Plan:  Assessment:  Michael Thomas is a 72 yo male SP treatment of left SFA lesion for Rutherford 3 class symptoms, now with complete resolution.   Prior non-invasive exam shows normalization of the left resting ABI.     I had a discussion with him regarding surveillance, which will be our strategy.  We will plan on repeat non-invasive exam when we see him back next.    Given the presence of diabetes, healthy foot care was also discussed, in accord with multi-disciplinary, Class 1 recommendations.1,3 Current recommendations advocate daily foot inspection, good nail care, avoiding barefoot walking, properly fitted footwear, seeking care with problems, and consideration of initiating routine podiatric care with at least annual inspection3.     Regarding medical management, maximal medical therapy for reduction of risk factors is still indicated as recommended by updated AHA guidelines1.  This includes anti-platelet medication, tight blood glucose control to a HbA1c < 7, tight blood pressure control, maximum-dose HMG-CoA reductase inhibitor.   Annual flu vaccination is also recommended, with Class 1  recommendation1.   Plan: - Repeat office visit in 4-6 months with a resting/post-exercise ABI/non-invasive exam  -Recommend maximal medical therapy for cardiovascular risk reduction, including anti-platelet therapy, continuing with 39m ASA daily -Observe healthy foot care habits, given the presence of diabetes, with at least annual foot inspection performed in the setting of DM.    - Annual flu vaccination is recommended in the setting of known PAD, in the absence of contra-indications.  - I have advised him to observe his other doctors appointments.  ___________________________________________________________________   1-Morley KosMD, et al. 2016 AHA/ACC Guideline on the Management of Patients With Lower Extremity Peripheral Artery Disease: Executive Summary: A Report of the American College of Cardiology/American Heart Association Task Force on Clinical Practice Guidelines. J Am Coll Cardiol. 2017 Mar 21;69(11):1465-1508. doi: 10.1016/j.jacc.2016.11.008.   2 - Norgren L, et al. TASC II Working Group. Inter-society consensus for the management of peripheral arterial disease. Int ATressia Miners 2007 Jun;26(2):81-157. Review. PubMed PMID: 116109604 3 - Hingorani A, et al. The management of diabetic foot: A clinical practice guideline by the Society for Vascular Surgery in collaboration with the [place] the Society  for Vascular Medicine. J Vasc Surg. 2016 Feb;63(2 Suppl):3S-21S. doi: 10.1016/j.jvs.2015.10.003. PubMed PMID: 254098119      Electronically Signed: JCorrie Mckusick12/18/2019, 4:17 PM   I spent a total of    15 Minutes in face to face in clinical consultation, greater than 50% of which was counseling/coordinating care for PAD, SP treatment of left SFA lesion.

## 2018-07-26 MED ORDER — FENOFIBRATE 160 MG PO TABS: 160.0000 mg | ORAL_TABLET | Freq: Every day | ORAL | 3 refills | Status: DC

## 2018-07-26 MED ORDER — OMEGA-3-ACID ETHYL ESTERS 1 G PO CAPS: 2.0000 g | ORAL_CAPSULE | Freq: Two times a day (BID) | ORAL | 3 refills | Status: DC

## 2018-07-26 NOTE — Addendum Note (Signed)
Addended by: Silverio Decamp on: 07/26/2018 02:07 PM   Modules accepted: Orders

## 2018-07-26 NOTE — Assessment & Plan Note (Signed)
Labs all look ok except lipids, most are good except trigs, we need to add fenofibrate AND lovaza considering they are almost 500.  We can recheck fasting in 2 months. Orders placed for everything

## 2018-07-30 DIAGNOSIS — Z114 Encounter for screening for human immunodeficiency virus [HIV]: Secondary | ICD-10-CM | POA: Insufficient documentation

## 2018-07-30 NOTE — Assessment & Plan Note (Signed)
HIV test came back positive, this looks more like a false positive so to confirm we have to do an HIV RNA quantitative test.  He should return to the lab to have this drawn.  I'll place the orders.

## 2018-07-30 NOTE — Addendum Note (Signed)
Addended by: Silverio Decamp on: 07/30/2018 01:03 AM   Modules accepted: Orders

## 2018-08-02 LAB — TSH: TSH: 1.08 mIU/L (ref 0.40–4.50)

## 2018-08-02 LAB — LIPID PANEL W/REFLEX DIRECT LDL
Cholesterol: 142 mg/dL (ref <200)
HDL: 26 mg/dL — ABNORMAL LOW (ref >40)
Non-HDL Cholesterol (Calc): 116 mg/dL (ref <130)
Total CHOL/HDL Ratio: 5.5 (calc) — ABNORMAL HIGH (ref <5.0)
Triglycerides: 457 mg/dL — ABNORMAL HIGH (ref <150)

## 2018-08-02 LAB — COMPREHENSIVE METABOLIC PANEL WITH GFR
AG Ratio: 1.6 (calc) (ref 1.0–2.5)
AST: 22 U/L (ref 10–35)
BUN/Creatinine Ratio: 17 (calc) (ref 6–22)
CO2: 23 mmol/L (ref 20–32)
Chloride: 104 mmol/L (ref 98–110)
Creat: 1.28 mg/dL — ABNORMAL HIGH (ref 0.70–1.18)
Globulin: 2.7 g/dL (ref 1.9–3.7)
Glucose, Bld: 201 mg/dL — ABNORMAL HIGH (ref 65–99)
Sodium: 136 mmol/L (ref 135–146)

## 2018-08-02 LAB — HIV-1 RNA, QUALITATIVE, TMA: HIV-1 RNA, Qualitative, TMA: NOT DETECTED

## 2018-08-02 LAB — CBC
HCT: 43.7 % (ref 38.5–50.0)
Hemoglobin: 14.8 g/dL (ref 13.2–17.1)
MCH: 28.3 pg (ref 27.0–33.0)
MCHC: 33.9 g/dL (ref 32.0–36.0)
MCV: 83.6 fL (ref 80.0–100.0)
MPV: 9 fL (ref 7.5–12.5)
Platelets: 373 Thousand/uL (ref 140–400)
RBC: 5.23 10*6/uL (ref 4.20–5.80)
RDW: 13.5 % (ref 11.0–15.0)
WBC: 6.8 Thousand/uL (ref 3.8–10.8)

## 2018-08-02 LAB — HIV ANTIBODY (ROUTINE TESTING W REFLEX): HIV 1&2 Ab, 4th Generation: REACTIVE — AB

## 2018-08-02 LAB — HIV-1/2 AB - DIFFERENTIATION
HIV-1 antibody: NEGATIVE
HIV-2 Ab: NEGATIVE

## 2018-08-02 LAB — PSA, TOTAL AND FREE
PSA, % Free: 21 % — ABNORMAL LOW (ref >25)
PSA, Free: 0.5 ng/mL
PSA, Total: 2.4 ng/mL (ref <=4.0)

## 2018-08-02 LAB — COMPREHENSIVE METABOLIC PANEL
ALT: 31 U/L (ref 9–46)
Albumin: 4.2 g/dL (ref 3.6–5.1)
Alkaline phosphatase (APISO): 56 U/L (ref 40–115)
BUN: 22 mg/dL (ref 7–25)
Calcium: 9.3 mg/dL (ref 8.6–10.3)
Potassium: 4.6 mmol/L (ref 3.5–5.3)
Total Bilirubin: 0.3 mg/dL (ref 0.2–1.2)
Total Protein: 6.9 g/dL (ref 6.1–8.1)

## 2018-08-02 LAB — HEPATITIS C ANTIBODY
Hepatitis C Ab: NONREACTIVE
SIGNAL TO CUT-OFF: 0.01 (ref <1.00)

## 2018-08-02 LAB — DIRECT LDL: Direct LDL: 58 mg/dL (ref <100)

## 2018-08-03 ENCOUNTER — Other Ambulatory Visit: Payer: Self-pay

## 2018-08-03 ENCOUNTER — Other Ambulatory Visit: Payer: Self-pay | Admitting: Sports Medicine

## 2018-08-03 MED ORDER — LISINOPRIL 10 MG PO TABS: 10.0000 mg | ORAL_TABLET | Freq: Every day | ORAL | 1 refills | Status: DC

## 2018-08-03 NOTE — Telephone Encounter (Signed)
Pharmacy called and stated patient has requested a refill on Lisinopril 10 mg. Historical provider.

## 2018-09-24 ENCOUNTER — Telehealth: Payer: Self-pay

## 2018-09-24 NOTE — Telephone Encounter (Signed)
RNA qualitative testing was negative, what other questions does he have?

## 2018-09-24 NOTE — Telephone Encounter (Signed)
It is reasonable to do a single additional test to ensure fully that there is no evidence of HIV virus, but his qualitative was negative, so I think the initial test was simply a false positive.  It is up to him whether or not he wants to get the single additional test done.  I would recommend it.

## 2018-09-24 NOTE — Telephone Encounter (Signed)
You placed an order for him to have the test again.

## 2018-09-24 NOTE — Telephone Encounter (Signed)
[place] would like to speak with Dr Dianah Field about his last HIV test. He wants a call back in ASAP.

## 2018-09-25 NOTE — Telephone Encounter (Signed)
Patient advised     No further questions

## 2018-10-19 ENCOUNTER — Other Ambulatory Visit: Payer: Self-pay | Admitting: Sports Medicine

## 2018-10-19 DIAGNOSIS — E118 Type 2 diabetes mellitus with unspecified complications: Secondary | ICD-10-CM

## 2018-10-19 DIAGNOSIS — E1169 Type 2 diabetes mellitus with other specified complication: Secondary | ICD-10-CM

## 2018-10-22 ENCOUNTER — Ambulatory Visit (INDEPENDENT_AMBULATORY_CARE_PROVIDER_SITE_OTHER): Payer: PPO | Admitting: Sports Medicine

## 2018-10-22 ENCOUNTER — Encounter: Payer: Self-pay | Admitting: Sports Medicine

## 2018-10-22 ENCOUNTER — Other Ambulatory Visit: Payer: Self-pay

## 2018-10-22 VITALS — BP 147/77 | HR 60 | Ht 61.0 in | Wt 140.0 lb

## 2018-10-22 DIAGNOSIS — J3089 Other allergic rhinitis: Secondary | ICD-10-CM | POA: Diagnosis not present

## 2018-10-22 DIAGNOSIS — E785 Hyperlipidemia, unspecified: Secondary | ICD-10-CM | POA: Diagnosis not present

## 2018-10-22 DIAGNOSIS — N401 Enlarged prostate with lower urinary tract symptoms: Secondary | ICD-10-CM

## 2018-10-22 DIAGNOSIS — I10 Essential (primary) hypertension: Secondary | ICD-10-CM | POA: Diagnosis not present

## 2018-10-22 DIAGNOSIS — E1169 Type 2 diabetes mellitus with other specified complication: Secondary | ICD-10-CM | POA: Diagnosis not present

## 2018-10-22 DIAGNOSIS — N138 Other obstructive and reflux uropathy: Secondary | ICD-10-CM

## 2018-10-22 LAB — COMPREHENSIVE METABOLIC PANEL
AG Ratio: 1.6 (calc) (ref 1.0–2.5)
AST: 23 U/L (ref 10–35)
Albumin: 4.5 g/dL (ref 3.6–5.1)
Alkaline phosphatase (APISO): 32 U/L — ABNORMAL LOW (ref 35–144)
CO2: 24 mmol/L (ref 20–32)
Calcium: 10.1 mg/dL (ref 8.6–10.3)
Chloride: 104 mmol/L (ref 98–110)
Creat: 1.41 mg/dL — ABNORMAL HIGH (ref 0.70–1.18)
Globulin: 2.8 g/dL (calc) (ref 1.9–3.7)
Potassium: 5.2 mmol/L (ref 3.5–5.3)
Total Bilirubin: 0.3 mg/dL (ref 0.2–1.2)

## 2018-10-22 LAB — LIPID PANEL W/REFLEX DIRECT LDL
Cholesterol: 127 mg/dL (ref <200)
HDL: 31 mg/dL — ABNORMAL LOW (ref >=40)
LDL Cholesterol (Calc): 73 mg/dL
Non-HDL Cholesterol (Calc): 96 mg/dL (ref <130)
Total CHOL/HDL Ratio: 4.1 (calc) (ref <5.0)
Triglycerides: 149 mg/dL (ref <150)

## 2018-10-22 LAB — COMPREHENSIVE METABOLIC PANEL WITH GFR
ALT: 28 U/L (ref 9–46)
BUN/Creatinine Ratio: 19 (calc) (ref 6–22)
BUN: 27 mg/dL — ABNORMAL HIGH (ref 7–25)
Glucose, Bld: 161 mg/dL — ABNORMAL HIGH (ref 65–99)
Sodium: 137 mmol/L (ref 135–146)
Total Protein: 7.3 g/dL (ref 6.1–8.1)

## 2018-10-22 LAB — POCT GLYCOSYLATED HEMOGLOBIN (HGB A1C): Hemoglobin A1C: 7.3 % — AB (ref 4.0–5.6)

## 2018-10-22 MED ORDER — LISINOPRIL 20 MG PO TABS: 20.0000 mg | ORAL_TABLET | Freq: Every day | ORAL | 3 refills | Status: DC

## 2018-10-22 MED ORDER — AZELASTINE HCL 0.1 % NA SOLN: 2.0000 | Freq: Two times a day (BID) | NASAL | 1 refills | Status: DC

## 2018-10-22 MED ORDER — CETIRIZINE HCL 10 MG PO TABS: 10.0000 mg | ORAL_TABLET | Freq: Every day | ORAL | 11 refills | Status: DC

## 2018-10-22 MED ORDER — TRIAMCINOLONE ACETONIDE 55 MCG/ACT NA AERO: 2.0000 | INHALATION_SPRAY | Freq: Every day | NASAL | 11 refills | Status: DC

## 2018-10-22 NOTE — Assessment & Plan Note (Signed)
Much better controlled, continue metformin, Januvia, glipizide.

## 2018-10-22 NOTE — Assessment & Plan Note (Signed)
Still elevated, increasing lisinopril to 20 mg. We will check his blood pressures at home and send me a MyChart message.

## 2018-10-22 NOTE — Progress Notes (Signed)
Subjective:    CC: Follow-up  HPI: Hypertension: Slightly elevated, no headaches, visual changes, chest pain.  Runny nose: Likely perennial allergic rhinitis, currently uses Flonase but not much else.  Diabetes mellitus type 2: A1c has improved considerably.  BPH: Historically well controlled on Flomax 0.4, more recently is noting increasing urgency, frequency, dribbling and weak stream.  I reviewed the past medical history, family history, social history, surgical history, and allergies today and no changes were needed.  Please see the problem list section below in epic for further details.  Past Medical History: Past Medical History:  Diagnosis Date  . Benign prostatic hyperplasia with urinary obstruction 03/25/2015  . Diabetes mellitus without complication ([place])   . Hyperlipidemia   . Kidney stone   . OSA (obstructive sleep apnea)   . Vitamin D deficiency    Past Surgical History: Past Surgical History:  Procedure Laterality Date  . DG ARTHRO THUMB*L*    . IR ANGIOGRAM EXTREMITY BILATERAL  02/20/2018  . IR ANGIOGRAM SELECTIVE EACH ADDITIONAL VESSEL  02/20/2018  . IR FEM POP ART ATHERECT INC PTA MOD SED  02/20/2018  . IR RADIOLOGIST EVAL & MGMT  02/01/2018  . IR RADIOLOGIST EVAL & MGMT  03/20/2018  . IR RADIOLOGIST EVAL & MGMT  07/25/2018  . IR US GUIDE VASC ACCESS RIGHT  02/20/2018  . NECK SURGERY    . VASECTOMY     Social History: Social History   Socioeconomic History  . Marital status: Married    Spouse name: Not on file  . Number of children: Not on file  . Years of education: Not on file  . Highest education level: Not on file  Occupational History  . Not on file  Social Needs  . Financial resource strain: Not on file  . Food insecurity:    Worry: Not on file    Inability: Not on file  . Transportation needs:    Medical: Not on file    Non-medical: Not on file  Tobacco Use  . Smoking status: Never Smoker  . Smokeless tobacco: Never Used  Substance and  Sexual Activity  . Alcohol use: No  . Drug use: No  . Sexual activity: Yes    Birth control/protection: None  Lifestyle  . Physical activity:    Days per week: Not on file    Minutes per session: Not on file  . Stress: Not on file  Relationships  . Social connections:    Talks on phone: Not on file    Gets together: Not on file    Attends religious service: Not on file    Active member of club or organization: Not on file    Attends meetings of clubs or organizations: Not on file    Relationship status: Not on file  Other Topics Concern  . Not on file  Social History Narrative  . Not on file   Family History: Family History  Problem Relation Age of Onset  . Stroke Mother   . Diabetes Mellitus II Mother   . Prostate cancer Neg Hx   . Kidney cancer Neg Hx   . Bladder Cancer Neg Hx    Allergies: Allergies  Allergen Reactions  . Amoxicillin Other (See Comments)    Fatigue and Myalgia and Rigors x2 attempts   Medications: See med rec.  Review of Systems: No fevers, chills, night sweats, weight loss, chest pain, or shortness of breath.   Objective:    General: Well Developed, well nourished, and in  no acute distress.  Neuro: Alert and oriented x3, extra-ocular muscles intact, sensation grossly intact.  HEENT: Normocephalic, atraumatic, pupils equal round reactive to light, neck supple, no masses, no lymphadenopathy, thyroid nonpalpable.  Skin: Warm and dry, no rashes. Cardiac: Regular rate and rhythm, no murmurs rubs or gallops, no lower extremity edema.  Respiratory: Clear to auscultation bilaterally. Not using accessory muscles, speaking in full sentences.  Impression and Recommendations:    Benign prostatic hyperplasia with urinary obstruction Increasing Flomax to 0.8 mg.  Diabetes mellitus, type 2 (HCC) Much better controlled, continue metformin, Januvia, glipizide.  Essential hypertension, benign Still elevated, increasing lisinopril to 20 mg. We will check  his blood pressures at home and send me a MyChart message.  Perennial allergic rhinitis Zyrtec, Nasacort, nasal azelastine.    ___________________________________________ Gwen Her. Dianah Field, M.D., ABFM., CAQSM. Primary Care and Sports Medicine [facility]  Adjunct Professor of [place] [employer] [facility] [employer] [facility] [employer] [facility] [employer]

## 2018-10-22 NOTE — Assessment & Plan Note (Signed)
Zyrtec, Nasacort, nasal azelastine.

## 2018-10-22 NOTE — Assessment & Plan Note (Signed)
Increasing Flomax to 0.8 mg.

## 2018-10-23 ENCOUNTER — Encounter: Payer: Self-pay | Admitting: Sports Medicine

## 2018-10-23 DIAGNOSIS — N289 Disorder of kidney and ureter, unspecified: Secondary | ICD-10-CM | POA: Insufficient documentation

## 2018-10-29 ENCOUNTER — Other Ambulatory Visit: Payer: Self-pay

## 2018-10-29 DIAGNOSIS — N401 Enlarged prostate with lower urinary tract symptoms: Principal | ICD-10-CM

## 2018-10-29 DIAGNOSIS — N138 Other obstructive and reflux uropathy: Secondary | ICD-10-CM

## 2018-10-29 MED ORDER — TAMSULOSIN HCL 0.4 MG PO CAPS: 0.8000 mg | ORAL_CAPSULE | Freq: Every day | ORAL | 0 refills | Status: DC

## 2018-10-29 NOTE — Telephone Encounter (Signed)
[place] needs a refill on tamsulosin 0.4mg  2 tablets daily. Historical provider.

## 2018-11-13 ENCOUNTER — Other Ambulatory Visit: Payer: Self-pay | Admitting: Sports Medicine

## 2018-11-13 DIAGNOSIS — J3089 Other allergic rhinitis: Secondary | ICD-10-CM

## 2018-12-12 ENCOUNTER — Other Ambulatory Visit: Payer: Self-pay | Admitting: Sports Medicine

## 2018-12-12 DIAGNOSIS — J3089 Other allergic rhinitis: Secondary | ICD-10-CM

## 2019-01-22 ENCOUNTER — Ambulatory Visit (INDEPENDENT_AMBULATORY_CARE_PROVIDER_SITE_OTHER): Payer: PPO | Admitting: Sports Medicine

## 2019-01-22 ENCOUNTER — Encounter: Payer: Self-pay | Admitting: Sports Medicine

## 2019-01-22 VITALS — BP 124/62 | HR 62 | Ht 61.0 in | Wt 142.0 lb

## 2019-01-22 DIAGNOSIS — E785 Hyperlipidemia, unspecified: Secondary | ICD-10-CM

## 2019-01-22 DIAGNOSIS — I10 Essential (primary) hypertension: Secondary | ICD-10-CM | POA: Diagnosis not present

## 2019-01-22 DIAGNOSIS — E1169 Type 2 diabetes mellitus with other specified complication: Secondary | ICD-10-CM | POA: Diagnosis not present

## 2019-01-22 DIAGNOSIS — K219 Gastro-esophageal reflux disease without esophagitis: Secondary | ICD-10-CM

## 2019-01-22 LAB — POCT GLYCOSYLATED HEMOGLOBIN (HGB A1C): Hemoglobin A1C: 7.2 % — AB (ref 4.0–5.6)

## 2019-01-22 NOTE — Assessment & Plan Note (Signed)
Stable and well controlled. 

## 2019-01-22 NOTE — Assessment & Plan Note (Signed)
Declines medications, he will elevate his head in bed with some pillows. If persistent symptoms we will discuss PPI.

## 2019-01-22 NOTE — Progress Notes (Signed)
Subjective:    CC: Follow-up  HPI: Hypertension: Well-controlled.  Hyperlipidemia: Stable.  Diabetes mellitus type 2: Had a few episodes of lows into the 60s, minimally symptomatic, a few highs into the 180s.  Hemoglobin A1c however is well controlled.  He does get somewhat ravenous in the evening around 10 PM, typically eats dinner at 6 PM.  In addition he has complained for the past few months of increasing hoarseness, throat clearing through the day.  No overt heartburn.  I reviewed the past medical history, family history, social history, surgical history, and allergies today and no changes were needed.  Please see the problem list section below in epic for further details.  Past Medical History: Past Medical History:  Diagnosis Date  . Benign prostatic hyperplasia with urinary obstruction 03/25/2015  . Diabetes mellitus without complication ([place])   . Hyperlipidemia   . Kidney stone   . OSA (obstructive sleep apnea)   . Vitamin D deficiency    Past Surgical History: Past Surgical History:  Procedure Laterality Date  . DG ARTHRO THUMB*L*    . IR ANGIOGRAM EXTREMITY BILATERAL  02/20/2018  . IR ANGIOGRAM SELECTIVE EACH ADDITIONAL VESSEL  02/20/2018  . IR FEM POP ART ATHERECT INC PTA MOD SED  02/20/2018  . IR RADIOLOGIST EVAL & MGMT  02/01/2018  . IR RADIOLOGIST EVAL & MGMT  03/20/2018  . IR RADIOLOGIST EVAL & MGMT  07/25/2018  . IR US GUIDE VASC ACCESS RIGHT  02/20/2018  . NECK SURGERY    . VASECTOMY     Social History: Social History   Socioeconomic History  . Marital status: Married    Spouse name: Not on file  . Number of children: Not on file  . Years of education: Not on file  . Highest education level: Not on file  Occupational History  . Not on file  Social Needs  . Financial resource strain: Not on file  . Food insecurity    Worry: Not on file    Inability: Not on file  . Transportation needs    Medical: Not on file    Non-medical: Not on file  Tobacco  Use  . Smoking status: Never Smoker  . Smokeless tobacco: Never Used  Substance and Sexual Activity  . Alcohol use: No  . Drug use: No  . Sexual activity: Yes    Birth control/protection: None  Lifestyle  . Physical activity    Days per week: Not on file    Minutes per session: Not on file  . Stress: Not on file  Relationships  . Social Herbalist on phone: Not on file    Gets together: Not on file    Attends religious service: Not on file    Active member of club or organization: Not on file    Attends meetings of clubs or organizations: Not on file    Relationship status: Not on file  Other Topics Concern  . Not on file  Social History Narrative  . Not on file   Family History: Family History  Problem Relation Age of Onset  . Stroke Mother   . Diabetes Mellitus II Mother   . Prostate cancer Neg Hx   . Kidney cancer Neg Hx   . Bladder Cancer Neg Hx    Allergies: Allergies  Allergen Reactions  . Amoxicillin Other (See Comments)    Fatigue and Myalgia and Rigors x2 attempts   Medications: See med rec.  Review of Systems: No fevers,  chills, night sweats, weight loss, chest pain, or shortness of breath.   Objective:    General: Well Developed, well nourished, and in no acute distress.  Neuro: Alert and oriented x3, extra-ocular muscles intact, sensation grossly intact.  HEENT: Normocephalic, atraumatic, pupils equal round reactive to light, neck supple, no masses, no lymphadenopathy, thyroid nonpalpable.  Skin: Warm and dry, no rashes. Cardiac: Regular rate and rhythm, no murmurs rubs or gallops, no lower extremity edema.  Respiratory: Clear to auscultation bilaterally. Not using accessory muscles, speaking in full sentences.  Diabetic Foot Exam Both feet were examined, there are no signs of ulceration or abnormal callus. Nails are unremarkable. Dorsalis pedis and posterior tibial pulses are palpable. Sensation is intact to sharp and monofilament.  Shoes are of appropriate fitment.  Impression and Recommendations:    Diabetes mellitus, type 2 (HCC) A1c is well controlled, he did have a couple of lows, I have advised that he eat a small snack with peanut butter in the evening around 9 PM. Foot exam was performed today. He is due for an eye exam, he will contact his ophthalmologist.  Essential hypertension, benign Well-controlled.  Hyperlipidemia Stable and well-controlled.   LPRD (laryngopharyngeal reflux disease) Declines medications, he will elevate his head in bed with some pillows. If persistent symptoms we will discuss PPI.   ___________________________________________ Gwen Her. Dianah Field, M.D., ABFM., CAQSM. Primary Care and Sports Medicine [facility]  Adjunct Professor of [employer] [facility] [employer] [facility] [employer]

## 2019-01-22 NOTE — Assessment & Plan Note (Signed)
Well controlled 

## 2019-01-22 NOTE — Assessment & Plan Note (Signed)
A1c is well controlled, he did have a couple of lows, I have advised that he eat a small snack with peanut butter in the evening around 9 PM. Foot exam was performed today. He is due for an eye exam, he will contact his ophthalmologist.

## 2019-01-23 ENCOUNTER — Other Ambulatory Visit: Payer: Self-pay | Admitting: Sports Medicine

## 2019-01-23 DIAGNOSIS — N138 Other obstructive and reflux uropathy: Secondary | ICD-10-CM

## 2019-02-14 ENCOUNTER — Other Ambulatory Visit: Payer: Self-pay | Admitting: Interventional Radiology

## 2019-02-14 DIAGNOSIS — I739 Peripheral vascular disease, unspecified: Secondary | ICD-10-CM

## 2019-02-14 DIAGNOSIS — I70219 Atherosclerosis of native arteries of extremities with intermittent claudication, unspecified extremity: Secondary | ICD-10-CM

## 2019-02-22 ENCOUNTER — Other Ambulatory Visit: Payer: Self-pay | Admitting: Sports Medicine

## 2019-02-22 MED ORDER — METFORMIN HCL 1000 MG PO TABS: 1000.0000 mg | ORAL_TABLET | Freq: Two times a day (BID) | ORAL | 1 refills | Status: DC

## 2019-02-27 ENCOUNTER — Other Ambulatory Visit: Payer: Self-pay | Admitting: Sports Medicine

## 2019-02-27 MED ORDER — ONETOUCH ULTRASOFT LANCETS MISC: 12 refills | Status: DC

## 2019-03-07 ENCOUNTER — Ambulatory Visit
Admission: RE | Admit: 2019-03-07 | Discharge: 2019-03-07 | Disposition: A | Discharge status: Home / Self Care | Payer: PPO | Source: Ambulatory Visit | Attending: Interventional Radiology | Admitting: Interventional Radiology

## 2019-03-07 ENCOUNTER — Encounter: Payer: Self-pay | Admitting: *Deleted

## 2019-03-07 DIAGNOSIS — I70213 Atherosclerosis of native arteries of extremities with intermittent claudication, bilateral legs: Secondary | ICD-10-CM | POA: Diagnosis not present

## 2019-03-07 DIAGNOSIS — I743 Embolism and thrombosis of arteries of the lower extremities: Secondary | ICD-10-CM | POA: Diagnosis not present

## 2019-03-07 DIAGNOSIS — Z955 Presence of coronary angioplasty implant and graft: Secondary | ICD-10-CM | POA: Diagnosis not present

## 2019-03-07 DIAGNOSIS — I70219 Atherosclerosis of native arteries of extremities with intermittent claudication, unspecified extremity: Secondary | ICD-10-CM

## 2019-03-07 DIAGNOSIS — I739 Peripheral vascular disease, unspecified: Secondary | ICD-10-CM

## 2019-03-07 DIAGNOSIS — Z8679 Personal history of other diseases of the circulatory system: Secondary | ICD-10-CM | POA: Diagnosis not present

## 2019-03-07 DIAGNOSIS — I872 Venous insufficiency (chronic) (peripheral): Secondary | ICD-10-CM | POA: Diagnosis not present

## 2019-03-07 HISTORY — PX: IR RADIOLOGIST EVAL & MGMT: IMG5224

## 2019-03-07 NOTE — Progress Notes (Signed)
Chief Complaint: PAD, claudication  Referring Physician(s): Dr. Dianah Field  History of Present Illness: Michael Thomas is a 73 y.o. male presenting today for a scheduled follow up of known PAD, SP left SFA treatment for critical stenosis July of 2019.    He is here today for his follow up appointment, and repeat non-invasive imaging.  He is here by himself.   He tells me that he continues to enjoy going on long walks with his wife, both on the street in the neighborhood as well as on hiking trails, and also continues to enjoy running.    He remained without symptoms until about 1 month ago when he noticed bilateral calf pain with short distance ambulation. The pain will resolve after resting during a workout, but will return after further walking/running.  This has hindered his ability to work out, and decreased his quality of life.  He denies any new wound or resting pain.  He denies any discolored toes.    He has a new non-invasive exam performed today with rest/post exercise ABI showing: Right ABI of 0.93 falling to 0.39 Left ABI of 0.99 falling to 0.55  The segmental exam shows evidence of bilateral distal SFA stenosis or occlusion.    He continues maximal medical therapy.   Past Medical History:  Diagnosis Date  . Benign prostatic hyperplasia with urinary obstruction 03/25/2015  . Diabetes mellitus without complication ([place])   . Hyperlipidemia   . Kidney stone   . OSA (obstructive sleep apnea)   . Vitamin D deficiency     Past Surgical History:  Procedure Laterality Date  . DG ARTHRO THUMB*L*    . IR ANGIOGRAM EXTREMITY BILATERAL  02/20/2018  . IR ANGIOGRAM SELECTIVE EACH ADDITIONAL VESSEL  02/20/2018  . IR FEM POP ART ATHERECT INC PTA MOD SED  02/20/2018  . IR RADIOLOGIST EVAL & MGMT  02/01/2018  . IR RADIOLOGIST EVAL & MGMT  03/20/2018  . IR RADIOLOGIST EVAL & MGMT  07/25/2018  . IR US GUIDE VASC ACCESS RIGHT  02/20/2018  . NECK SURGERY    . VASECTOMY       Allergies: Amoxicillin  Medications: Prior to Admission medications   Medication Sig Start Date End Date Taking? Authorizing Provider  aspirin EC 81 MG tablet Take 81 mg by mouth daily.     [provider]  azelastine (ASTELIN) 0.1 % nasal spray PLACE 2 SPRAYS INTO BOTH NOSTRILS 2 (TWO) TIMES DAILY. USE IN EACH NOSTRIL AS DIRECTED 12/12/18   Silverio Decamp, MD  cetirizine (ZYRTEC) 10 MG tablet Take 1 tablet (10 mg total) by mouth daily. 10/22/18   Silverio Decamp, MD  fenofibrate 160 MG tablet Take 1 tablet (160 mg total) by mouth daily. 07/26/18   Silverio Decamp, MD  glipiZIDE (GLUCOTROL) 10 MG tablet TAKE 1 TABLET BY MOUTH TWICE A DAY BEFORE MEALS 10/19/18   Silverio Decamp, MD  Lancets [facility] ULTRASOFT) lancets Use as instructed 02/27/19   Silverio Decamp, MD  lisinopril (PRINIVIL,ZESTRIL) 20 MG tablet Take 1 tablet (20 mg total) by mouth daily. 10/22/18   Silverio Decamp, MD  magnesium oxide (MAG-OX) 400 MG tablet Take 400 mg by mouth daily.     [provider]  meloxicam (MOBIC) 15 MG tablet One tab PO qAM with breakfast for 2 weeks, then daily prn pain. Patient not taking: Reported on 07/25/2018 07/25/18   Silverio Decamp, MD  metFORMIN (GLUCOPHAGE) 1000 MG tablet Take 1 tablet (1,000  mg total) by mouth 2 (two) times daily with a meal. 02/22/19   Silverio Decamp, MD  omega-3 acid ethyl esters (LOVAZA) 1 g capsule Take 2 capsules (2 g total) by mouth 2 (two) times daily. 07/26/18   Silverio Decamp, MD  simvastatin (ZOCOR) 40 MG tablet Take 1 tablet (40 mg total) by mouth daily at 6 PM. 07/12/18   Silverio Decamp, MD  sitaGLIPtin (JANUVIA) 50 MG tablet Take 1 tablet (50 mg total) by mouth daily. 07/25/18   Silverio Decamp, MD  tamsulosin (FLOMAX) 0.4 MG CAPS capsule TAKE 2 CAPSULES BY MOUTH EVERY DAY 01/23/19   Silverio Decamp, MD  triamcinolone (NASACORT) 55 MCG/ACT AERO nasal inhaler  Place 2 sprays into the nose daily. 10/22/18   Silverio Decamp, MD     Family History  Problem Relation Age of Onset  . Stroke Mother   . Diabetes Mellitus II Mother   . Prostate cancer Neg Hx   . Kidney cancer Neg Hx   . Bladder Cancer Neg Hx     Social History   Socioeconomic History  . Marital status: Married    Spouse name: Not on file  . Number of children: Not on file  . Years of education: Not on file  . Highest education level: Not on file  Occupational History  . Not on file  Social Needs  . Financial resource strain: Not on file  . Food insecurity    Worry: Not on file    Inability: Not on file  . Transportation needs    Medical: Not on file    Non-medical: Not on file  Tobacco Use  . Smoking status: Never Smoker  . Smokeless tobacco: Never Used  Substance and Sexual Activity  . Alcohol use: No  . Drug use: No  . Sexual activity: Yes    Birth control/protection: None  Lifestyle  . Physical activity    Days per week: Not on file    Minutes per session: Not on file  . Stress: Not on file  Relationships  . Social Herbalist on phone: Not on file    Gets together: Not on file    Attends religious service: Not on file    Active member of club or organization: Not on file    Attends meetings of clubs or organizations: Not on file    Relationship status: Not on file  Other Topics Concern  . Not on file  Social History Narrative  . Not on file    Review of Systems: A 12 point ROS discussed and pertinent positives are indicated in the HPI above.  All other systems are negative.  Review of Systems  Vital Signs: BP 131/65 (BP Location: Right Arm)   SpO2 99%   Physical Exam General: 73 yo male appearing  stated age.  Well-developed, well-nourished.  No distress. HEENT: Atraumatic, normocephalic. Wearing glasses. Conjugate gaze, extra-ocular motor intact. No scleral icterus or scleral injection. No lesions on external ears, nose, lips,  or gums.  Oral mucosa moist, pink.  Neck: Symmetric with no goiter enlargement.  Chest/Lungs:  Symmetric chest with inspiration/expiration.  No labored breathing.  Clear to auscultation with no wheezes, rhonchi, or rales.  Heart:  RRR, with no third heart sounds appreciated. No JVD appreciated.  Abdomen:  Soft, NT/ND, with + bowel sounds.   Genito-urinary: Deferred Neurologic: Alert & Oriented to person, place, and time.   Normal affect and insight.  Appropriate questions.  Moving all 4 extremities with gross sensory intact.  Pulse Exam:  Palpable radial pulses.  Doppler positive signal on the left and right DP and PT.  Extremity:  No wounds. No swelling of the lower extremity.   Imaging: No results found.  Labs:  CBC: Recent Labs    04/04/18 1354 07/25/18 0944  WBC 8.0 6.8  HGB 13.8 14.8  HCT 40.6 43.7  PLT 385 373    COAGS: No results for input(s): INR, APTT in the last 8760 hours.  BMP: Recent Labs    04/04/18 1354 07/25/18 0944 10/22/18 1138  NA 137 136 137  K 4.9 4.6 5.2  CL 100 104 104  CO2 27 23 24   GLUCOSE 144* 201* 161*  BUN 26* 22 27*  CALCIUM 10.2 9.3 10.1  CREATININE 1.44* 1.28* 1.41*  GFRNONAA 47*  --   --   GFRAA 55*  --   --     LIVER FUNCTION TESTS: Recent Labs    04/04/18 1354 07/25/18 0944 10/22/18 1138  BILITOT 0.3 0.3 0.3  AST 11 22 23   ALT 12 31 28   PROT 6.9 6.9 7.3    TUMOR MARKERS: No results for input(s): AFPTM, CEA, CA199, CHROMGRNA in the last 8760 hours.  Assessment and Plan:  Michael Thomas is a 1 male presenting with life-style limiting, short-distance claudication of the bilateral lower extremities, compatible with Rutherford 3 class symptoms.   He is quite disappointed in his inability to run and walk with his family and friends.      Non-invasive lower extremity exam and imaging work-up shows evidence of bilateral SFA stenosis/occlusion.   I had a discussion with Michael Thomas regarding anatomy, pathology/pathophysiology,  natural history, and prognosis of PAD/CLI.  Informed consent regarding treatment strategies was performed which would possibly include medical management and a walking program, surgical strategy, and/or endovascular options, with risk/benefit discussion.  The indications for treatment supported by updated guidelines1, 2 were discussed.  Michael Thomas strongly favors proceeding with endovascular options/intervention, as he wants his usual quality of life to continue.   Regarding endovascular options, specific risks discussed include: bleeding, infection, contrast reaction, renal injury/nephropathy, arterial injury/dissection, need for additional procedure/surgery, worsening symptoms/tissue including limb loss, cardiopulmonary collapse, death.    Regarding medical management, maximal medical therapy for reduction of risk factors is indicated as recommended by updated AHA guidelines1.  This includes anti-platelet medication, tight blood glucose control to a HbA1c < 7, tight blood pressure control, maximum-dose HMG-CoA reductase inhibitor.   One of his main concerns is financial toxicity, as he recognizes the expense of such a treatment, and he thinks that his responsibility will be about 80% of the bill.   Plan: - He would like to proceed with treatment of the bilateral lower extremity, however he is concerned with financial toxicity. As such: We can call him back with the codes for the following planned procedures so that he can arrange finances with his insurance. Code 1:  CTA runoff for surgical planning Code 2 & 3: Angiogram runoff of the bilateral lower extremity, with angioplasty and possible stenting of right (left) femoral-popliteal artery.  - Once he has checked with insurance and wants to proceed, our plan would be either: A - proceed with CTA runoff, then treatment of the right lower extremity or B - proceed with treatment of the right lower extremity with bilateral lower extremity angiogram  and runoff   -Continue maximal medical therapy for cardiovascular risk reduction, including anti-platelet therapy.  -Observe healthy  foot care habits, given the presence of diabetes, with at least annual foot inspection performed in the setting of DM.      ___________________________________________________________________   1Morley Kos MD, et al. 2016 AHA/ACC Guideline on the Management of Patients With Lower Extremity Peripheral Artery Disease: Executive Summary: A Report of the American College of Cardiology/American Heart Association Task Force on Clinical Practice Guidelines. J Am Coll Cardiol. 2017 Mar 21;69(11):1465-1508. doi: 10.1016/j.jacc.2016.11.008.   2 - Norgren L, et al. TASC II Working Group. Inter-society consensus for the management of peripheral arterial disease. Int Tressia Miners. 2007 Jun;26(2):81-157. Review. PubMed PMID: 56389373  3 - Hingorani A, et al. The management of diabetic foot: A clinical practice guideline by the Society for Vascular Surgery in collaboration with the [place] and the Society  for Vascular Medicine. J Vasc Surg. 2016 Feb;63(2 Suppl):3S-21S. doi: 10.1016/j.jvs.2015.10.003. PubMed PMID: 42876811.     Electronically Signed: Corrie Mckusick 03/07/2019, 4:48 PM   I spent a total of    25 Minutes in face to face in clinical consultation, greater than 50% of which was counseling/coordinating care for PAD, bilateral lower extremity claudication, possible angiogram and intervention

## 2019-03-12 ENCOUNTER — Other Ambulatory Visit (HOSPITAL_COMMUNITY): Payer: Self-pay | Admitting: Interventional Radiology

## 2019-03-12 DIAGNOSIS — I739 Peripheral vascular disease, unspecified: Secondary | ICD-10-CM

## 2019-03-18 ENCOUNTER — Telehealth (HOSPITAL_COMMUNITY): Payer: Self-pay

## 2019-03-18 NOTE — Telephone Encounter (Signed)
Called to schedule leg angio, no answer, left vm. AW

## 2019-03-28 ENCOUNTER — Other Ambulatory Visit: Payer: Self-pay

## 2019-03-28 DIAGNOSIS — Z20822 Contact with and (suspected) exposure to covid-19: Secondary | ICD-10-CM

## 2019-03-29 LAB — NOVEL CORONAVIRUS, NAA: SARS-CoV-2, NAA: NOT DETECTED

## 2019-04-03 LAB — HM DIABETES EYE EXAM

## 2019-04-04 ENCOUNTER — Other Ambulatory Visit: Payer: Self-pay | Admitting: Radiology

## 2019-04-04 ENCOUNTER — Telehealth (HOSPITAL_COMMUNITY): Payer: Self-pay

## 2019-04-04 ENCOUNTER — Other Ambulatory Visit: Payer: Self-pay | Admitting: Student

## 2019-04-05 ENCOUNTER — Ambulatory Visit (HOSPITAL_COMMUNITY)
Admission: RE | Admit: 2019-04-05 | Discharge: 2019-04-05 | Disposition: A | Discharge status: Home / Self Care | Payer: PPO | Source: Ambulatory Visit | Attending: Interventional Radiology | Admitting: Interventional Radiology

## 2019-04-05 ENCOUNTER — Other Ambulatory Visit: Payer: Self-pay

## 2019-04-05 ENCOUNTER — Other Ambulatory Visit (HOSPITAL_COMMUNITY): Payer: Self-pay | Admitting: Interventional Radiology

## 2019-04-05 ENCOUNTER — Other Ambulatory Visit: Payer: Self-pay | Admitting: Interventional Radiology

## 2019-04-05 ENCOUNTER — Encounter (HOSPITAL_COMMUNITY): Payer: Self-pay | Admitting: Interventional Radiology

## 2019-04-05 DIAGNOSIS — I70223 Atherosclerosis of native arteries of extremities with rest pain, bilateral legs: Secondary | ICD-10-CM | POA: Insufficient documentation

## 2019-04-05 DIAGNOSIS — G4733 Obstructive sleep apnea (adult) (pediatric): Secondary | ICD-10-CM | POA: Insufficient documentation

## 2019-04-05 DIAGNOSIS — Z79899 Other long term (current) drug therapy: Secondary | ICD-10-CM | POA: Insufficient documentation

## 2019-04-05 DIAGNOSIS — E559 Vitamin D deficiency, unspecified: Secondary | ICD-10-CM | POA: Insufficient documentation

## 2019-04-05 DIAGNOSIS — E1151 Type 2 diabetes mellitus with diabetic peripheral angiopathy without gangrene: Secondary | ICD-10-CM | POA: Diagnosis not present

## 2019-04-05 DIAGNOSIS — Z7982 Long term (current) use of aspirin: Secondary | ICD-10-CM | POA: Insufficient documentation

## 2019-04-05 DIAGNOSIS — E785 Hyperlipidemia, unspecified: Secondary | ICD-10-CM | POA: Insufficient documentation

## 2019-04-05 DIAGNOSIS — Z7984 Long term (current) use of oral hypoglycemic drugs: Secondary | ICD-10-CM | POA: Diagnosis not present

## 2019-04-05 DIAGNOSIS — I739 Peripheral vascular disease, unspecified: Secondary | ICD-10-CM

## 2019-04-05 DIAGNOSIS — I70213 Atherosclerosis of native arteries of extremities with intermittent claudication, bilateral legs: Secondary | ICD-10-CM | POA: Diagnosis not present

## 2019-04-05 HISTORY — PX: IR ANGIOGRAM EXTREMITY BILATERAL: IMG653

## 2019-04-05 HISTORY — PX: IR US GUIDE VASC ACCESS LEFT: IMG2389

## 2019-04-05 LAB — BASIC METABOLIC PANEL
Anion gap: 9 (ref 5–15)
BUN: 23 mg/dL (ref 8–23)
CO2: 20 mmol/L — ABNORMAL LOW (ref 22–32)
Calcium: 9.2 mg/dL (ref 8.9–10.3)
Chloride: 107 mmol/L (ref 98–111)
Creatinine, Ser: 1.24 mg/dL (ref 0.61–1.24)
GFR calc Af Amer: >60 mL/min (ref >60)
GFR calc non Af Amer: 57 mL/min — ABNORMAL LOW (ref >60)
Glucose, Bld: 160 mg/dL — ABNORMAL HIGH (ref 70–99)
Potassium: 4.6 mmol/L (ref 3.5–5.1)
Sodium: 136 mmol/L (ref 135–145)

## 2019-04-05 LAB — GLUCOSE, CAPILLARY: Glucose-Capillary: 134 mg/dL — ABNORMAL HIGH (ref 70–99)

## 2019-04-05 LAB — CBC
HCT: 39.7 % (ref 39.0–52.0)
Hemoglobin: 13 g/dL (ref 13.0–17.0)
MCH: 28.6 pg (ref 26.0–34.0)
MCHC: 32.7 g/dL (ref 30.0–36.0)
MCV: 87.3 fL (ref 80.0–100.0)
Platelets: 312 10*3/uL (ref 150–400)
RBC: 4.55 MIL/uL (ref 4.22–5.81)
RDW: 13.4 % (ref 11.5–15.5)
WBC: 5.6 10*3/uL (ref 4.0–10.5)
nRBC: 0 % (ref 0.0–0.2)

## 2019-04-05 LAB — PROTIME-INR
INR: 1 (ref 0.8–1.2)
Prothrombin Time: 13 seconds (ref 11.4–15.2)

## 2019-04-05 MED ORDER — ASPIRIN 325 MG PO TABS
325.0000 mg | ORAL_TABLET | Freq: Once | ORAL | Status: AC
  Administered 2019-04-05: 325 mg via ORAL

## 2019-04-05 MED ORDER — HEPARIN SODIUM (PORCINE) 1000 UNIT/ML IJ SOLN
INTRAMUSCULAR | Status: AC
  Filled 2019-04-05: qty 1

## 2019-04-05 MED ORDER — IODIXANOL 320 MG/ML IV SOLN
100.0000 mL | Freq: Once | INTRAVENOUS | Status: AC | PRN
  Administered 2019-04-05: 10:00:00 40 mL via INTRA_ARTERIAL

## 2019-04-05 MED ORDER — IODIXANOL 320 MG/ML IV SOLN
100.0000 mL | Freq: Once | INTRAVENOUS | Status: AC | PRN
  Administered 2019-04-05: 25 mL via INTRA_ARTERIAL

## 2019-04-05 MED ORDER — LIDOCAINE HCL 1 % IJ SOLN
INTRAMUSCULAR | Status: AC
  Filled 2019-04-05: qty 20

## 2019-04-05 MED ORDER — ASPIRIN 325 MG PO TABS
ORAL_TABLET | ORAL | Status: AC
  Filled 2019-04-05: qty 1

## 2019-04-05 MED ORDER — FENTANYL CITRATE (PF) 100 MCG/2ML IJ SOLN
INTRAMUSCULAR | Status: AC | PRN
  Administered 2019-04-05: 50 ug via INTRAVENOUS

## 2019-04-05 MED ORDER — MIDAZOLAM HCL 2 MG/2ML IJ SOLN
INTRAMUSCULAR | Status: AC
  Filled 2019-04-05: qty 2

## 2019-04-05 MED ORDER — LIDOCAINE HCL 1 % IJ SOLN
INTRAMUSCULAR | Status: AC | PRN
  Administered 2019-04-05: 5 mL

## 2019-04-05 MED ORDER — MIDAZOLAM HCL 2 MG/2ML IJ SOLN
INTRAMUSCULAR | Status: AC | PRN
  Administered 2019-04-05: 1 mg via INTRAVENOUS

## 2019-04-05 MED ORDER — FENTANYL CITRATE (PF) 100 MCG/2ML IJ SOLN
INTRAMUSCULAR | Status: AC
  Filled 2019-04-05: qty 2

## 2019-04-05 NOTE — H&P (Signed)
Chief Complaint: Patient was seen in consultation today for peripheral vascular disease  Supervising Physician: Corrie Mckusick  Patient Status: [facility] - Out-pt  History of Present Illness: Michael Thomas is a 73 y.o. male with known history of PAD who underwent left SFA angioplasty and stenting for critical stenosis July 2019.  Patient recently met with Dr. Earleen Newport in follow-up 03/07/19 at which time he reported ongoing calf pain. ABI 03/07/19 showed: Right ABI of 0.93 falling to 0.39 Left ABI of 0.99 falling to 0.55  Patient presents today for angiogram bilateral lower extremities with possible intervention on the right.  He describes his symptoms as "the same".  Denies new or worsening symptoms. He has been n.p.o. He does take aspirin 81 mg at home.  Last took yesterday.  Case reviewed by Dr. Jacqualyn Posey who plans to proceed today.  Past Medical History:  Diagnosis Date   Benign prostatic hyperplasia with urinary obstruction 03/25/2015   Diabetes mellitus without complication ([place])    Hyperlipidemia    Kidney stone    OSA (obstructive sleep apnea)    Vitamin D deficiency     Past Surgical History:  Procedure Laterality Date   DG ARTHRO THUMB*L*     IR ANGIOGRAM EXTREMITY BILATERAL  02/20/2018   IR ANGIOGRAM SELECTIVE EACH ADDITIONAL VESSEL  02/20/2018   IR FEM POP ART ATHERECT INC PTA MOD SED  02/20/2018   IR RADIOLOGIST EVAL & MGMT  02/01/2018   IR RADIOLOGIST EVAL & MGMT  03/20/2018   IR RADIOLOGIST EVAL & MGMT  07/25/2018   IR RADIOLOGIST EVAL & MGMT  03/07/2019   IR US GUIDE VASC ACCESS RIGHT  02/20/2018   NECK SURGERY     VASECTOMY      Allergies: Amoxicillin  Medications: Prior to Admission medications   Medication Sig Start Date End Date Taking? Authorizing Provider  aspirin EC 81 MG tablet Take 81 mg by mouth daily.    Yes [provider]  fenofibrate 160 MG tablet Take 1 tablet (160 mg total) by mouth daily. 07/26/18  Yes  Silverio Decamp, MD  glipiZIDE (GLUCOTROL) 10 MG tablet TAKE 1 TABLET BY MOUTH TWICE A DAY BEFORE MEALS 10/19/18  Yes Silverio Decamp, MD  Lancets [facility] ULTRASOFT) lancets Use as instructed 02/27/19  Yes Silverio Decamp, MD  lisinopril (PRINIVIL,ZESTRIL) 20 MG tablet Take 1 tablet (20 mg total) by mouth daily. 10/22/18  Yes Silverio Decamp, MD  metFORMIN (GLUCOPHAGE) 1000 MG tablet Take 1 tablet (1,000 mg total) by mouth 2 (two) times daily with a meal. 02/22/19  Yes Silverio Decamp, MD  omega-3 acid ethyl esters (LOVAZA) 1 g capsule Take 2 capsules (2 g total) by mouth 2 (two) times daily. 07/26/18  Yes Silverio Decamp, MD  simvastatin (ZOCOR) 40 MG tablet Take 1 tablet (40 mg total) by mouth daily at 6 PM. 07/12/18  Yes Silverio Decamp, MD  sitaGLIPtin (JANUVIA) 50 MG tablet Take 1 tablet (50 mg total) by mouth daily. 07/25/18  Yes Silverio Decamp, MD  tamsulosin (FLOMAX) 0.4 MG CAPS capsule TAKE 2 CAPSULES BY MOUTH EVERY DAY 01/23/19   Silverio Decamp, MD     Family History  Problem Relation Age of Onset   Stroke Mother    Diabetes Mellitus II Mother    Prostate cancer Neg Hx    Kidney cancer Neg Hx    Bladder Cancer Neg Hx     Social History   Socioeconomic History   Marital  status: Married    Spouse name: Not on file   Number of children: Not on file   Years of education: Not on file   Highest education level: Not on file  Occupational History   Not on file  Social Needs   Financial resource strain: Not on file   Food insecurity    Worry: Not on file    Inability: Not on file   Transportation needs    Medical: Not on file    Non-medical: Not on file  Tobacco Use   Smoking status: Never Smoker   Smokeless tobacco: Never Used  Substance and Sexual Activity   Alcohol use: No   Drug use: No   Sexual activity: Yes    Birth control/protection: None  Lifestyle   Physical activity    Days per  week: Not on file    Minutes per session: Not on file   Stress: Not on file  Relationships   Social connections    Talks on phone: Not on file    Gets together: Not on file    Attends religious service: Not on file    Active member of club or organization: Not on file    Attends meetings of clubs or organizations: Not on file    Relationship status: Not on file  Other Topics Concern   Not on file  Social History Narrative   Not on file     Review of Systems: A 12 point ROS discussed and pertinent positives are indicated in the HPI above.  All other systems are negative.  Review of Systems  Constitutional: Negative for fatigue and fever.  Respiratory: Negative for cough and shortness of breath.   Cardiovascular: Negative for chest pain.  Gastrointestinal: Negative for abdominal pain, nausea and vomiting.  Genitourinary: Negative for dysuria.  Musculoskeletal: Positive for gait problem (claudication).  Neurological: Negative for headaches.  Psychiatric/Behavioral: Negative for behavioral problems and confusion.    Vital Signs: BP (!) 154/83    Pulse (!) 59    Temp 98.3 F (36.8 C) (Oral)    Resp 16    Ht '5\' 1"'  (1.549 m)    Wt 138 lb (62.6 kg)    SpO2 100%    BMI 26.07 kg/m   Physical Exam Vitals signs and nursing note reviewed.  Constitutional:      Appearance: Normal appearance.  HENT:     Mouth/Throat:     Mouth: Mucous membranes are moist.     Pharynx: Oropharynx is clear.  Cardiovascular:     Rate and Rhythm: Normal rate and regular rhythm.     Heart sounds: No murmur. No friction rub. No gallop.      Comments: R: PT not palpable, DP palpable L: PT and DP palpable Pulmonary:     Effort: Pulmonary effort is normal. No respiratory distress.     Breath sounds: Normal breath sounds.  Musculoskeletal:        General: Tenderness present. No swelling.  Skin:    General: Skin is warm and dry.  Neurological:     General: No focal deficit present.     Mental  Status: He is alert and oriented to person, place, and time.  Psychiatric:        Mood and Affect: Mood normal.        Behavior: Behavior normal.        Thought Content: Thought content normal.        Judgment: Judgment normal.  MD Evaluation Airway: WNL Heart: WNL Abdomen: WNL Chest/ Lungs: WNL ASA  Classification: 3 Mallampati/Airway Score: Two   Imaging: US Arterial Segmental Exercise (use For Claudication)  Result Date: 03/07/2019 CLINICAL DATA:  73 year old male with a history of PAD, status post revascularization of left SFA lesion with drug-eluting balloon angioplasty performed 02/20/2018 EXAM: NONINVASIVE PHYSIOLOGIC VASCULAR STUDY OF BILATERAL LOWER EXTREMITIES TECHNIQUE: Non-invasive vascular evaluation of both lower extremities was performed at rest, including calculation of ankle-brachial indices, multiple segmental pressure evaluation, segmental Doppler and segmental pulse volume recording. COMPARISON:  None. FINDINGS: Right: Resting ankle brachial index:  0.93 Post exercise ABI: 0.39 Segmental blood pressure: Symmetric upper extremity pressures. Appropriate increased to the thigh. 30 mm Hg drop between thigh and the calf. Doppler: Segmental Doppler demonstrates triphasic femoral artery and popliteal artery. Triphasic posterior tibial artery and dorsalis pedis. Pulse volume recording: Segmental PVR demonstrates waveforms maintained throughout Left: Resting ankle brachial index: 0.99 Post exercise ABI: 0.55 Segmental blood pressure: Symmetric upper extremity pressures. Appropriate oral by. Low thigh to calf decrease at least 30 mm of mercury. Doppler: Segmental Doppler demonstrates triphasic femoral artery, popliteal artery, posterior tibial artery, dorsalis pedis. Pulse volume recording: Segmental PVR demonstrates waveforms maintained throughout. Additional: IMPRESSION: Right: Resting ABI within normal limits with the post exercise ABI falling to the severe range PAD. Segmental  exam demonstrates evidence of developing femoropopliteal stenosis. Left: Resting ABI within normal limits with post exercise ABI falling into the moderate range arterial occlusive disease. Segmental exam demonstrates evidence of developing femoropopliteal stenosis. Signed, Dulcy Fanny. Dellia Nims, RPVI [facility] Radiology Electronically Signed   By: Corrie Mckusick D.O.   On: 03/07/2019 16:47   Ir Radiologist Eval & Mgmt  Result Date: 03/07/2019 Please refer to notes tab for details about interventional procedure. (Op Note)   Labs:  CBC: Recent Labs    07/25/18 0944 04/05/19 0735  WBC 6.8 5.6  HGB 14.8 13.0  HCT 43.7 39.7  PLT 373 312    COAGS: Recent Labs    04/05/19 0735  INR 1.0    BMP: Recent Labs    07/25/18 0944 10/22/18 1138 04/05/19 0735  NA 136 137 136  K 4.6 5.2 4.6  CL 104 104 107  CO2 23 24 20*  GLUCOSE 201* 161* 160*  BUN 22 27* 23  CALCIUM 9.3 10.1 9.2  CREATININE 1.28* 1.41* 1.24  GFRNONAA  --   --  57*  GFRAA  --   --  >60    LIVER FUNCTION TESTS: Recent Labs    07/25/18 0944 10/22/18 1138  BILITOT 0.3 0.3  AST 22 23  ALT 31 28  PROT 6.9 7.3    TUMOR MARKERS: No results for input(s): AFPTM, CEA, CA199, CHROMGRNA in the last 8760 hours.  Assessment and Plan: Patient with past medical history of peripheral vascular disease, claudication presents for ongoing management and treatment of his atherosclerosis and stenosis.   Patient presents today in their usual state of health.  He has been NPO and is not currently on blood thinners.   Risks and benefits were discussed with the patient including, but not limited to bleeding, infection, vascular injury or contrast induced renal failure.  This interventional procedure involves the use of X-rays and because of the nature of the planned procedure, it is possible that we will have prolonged use of X-ray fluoroscopy.  Potential radiation risks to you  include (but are not limited to) the following: - A slightly elevated risk  for cancer  several years later in life. This risk is typically less than 0.5% percent. This risk is low in comparison to the normal incidence of human cancer, which is 33% for women and 50% for men according to the [place]. - Radiation induced injury can include skin redness, resembling a rash, tissue breakdown / ulcers and hair loss (which can be temporary or permanent).   The likelihood of either of these occurring depends on the difficulty of the procedure and whether you are sensitive to radiation due to previous procedures, disease, or genetic conditions.   IF your procedure requires a prolonged use of radiation, you will be notified and given written instructions for further action.  It is your responsibility to monitor the irradiated area for the 2 weeks following the procedure and to notify your physician if you are concerned that you have suffered a radiation induced injury.    All of the patient's questions were answered, patient is agreeable to proceed.  Consent signed and in chart.  Thank you for this interesting consult.  I greatly enjoyed meeting Donielle Claude Kirkendoll and look forward to participating in their care.  A copy of this report was sent to the requesting provider on this date.  Electronically Signed: Docia Barrier, PA 04/05/2019, 8:50 AM   I spent a total of    15 Minutes in face to face in clinical consultation, greater than 50% of which was counseling/coordinating care for peripheral vascular disease

## 2019-04-05 NOTE — Discharge Instructions (Addendum)
Femoral Site Care °This sheet gives you information about how to care for yourself after your procedure. Your health care provider may also give you more specific instructions. If you have problems or questions, contact your health care provider. °What can I expect after the procedure? °After the procedure, it is common to have: °· Bruising that usually fades within 1-2 weeks. °· Tenderness at the site. °Follow these instructions at home: °Wound care °· Follow instructions from your health care provider about how to take care of your insertion site. Make sure you: °? Wash your hands with soap and water before you change your bandage (dressing). If soap and water are not available, use hand sanitizer. °? Change your dressing as told by your health care provider. °? Leave stitches (sutures), skin glue, or adhesive strips in place. These skin closures may need to stay in place for 2 weeks or longer. If adhesive strip edges start to loosen and curl up, you may trim the loose edges. Do not remove adhesive strips completely unless your health care provider tells you to do that. °· Do not take baths, swim, or use a hot tub until your health care provider approves. °· You may shower 24-48 hours after the procedure or as told by your health care provider. °? Gently wash the site with plain soap and water. °? Pat the area dry with a clean towel. °? Do not rub the site. This may cause bleeding. °· Do not apply powder or lotion to the site. Keep the site clean and dry. °· Check your femoral site every day for signs of infection. Check for: °? Redness, swelling, or pain. °? Fluid or blood. °? Warmth. °? Pus or a bad smell. °Activity °· For the first 2-3 days after your procedure, or as long as directed: °? Avoid climbing stairs as much as possible. °? Do not squat. °· Do not lift anything that is heavier than 10 lb (4.5 kg), or the limit that you are told, until your health care provider says that it is safe. °· Rest as  directed. °? Avoid sitting for a long time without moving. Get up to take short walks every 1-2 hours. °· Do not drive for 24 hours if you were given a medicine to help you relax (sedative). °General instructions °· Take over-the-counter and prescription medicines only as told by your health care provider. °· Keep all follow-up visits as told by your health care provider. This is important. °Contact a health care provider if you have: °· A fever or chills. °· You have redness, swelling, or pain around your insertion site. °Get help right away if: °· The catheter insertion area swells very fast. °· You pass out. °· You suddenly start to sweat or your skin gets clammy. °· The catheter insertion area is bleeding, and the bleeding does not stop when you hold steady pressure on the area. °· The area near or just beyond the catheter insertion site becomes pale, cool, tingly, or numb. °These symptoms may represent a serious problem that is an emergency. Do not wait to see if the symptoms will go away. Get medical help right away. Call your local emergency services (911 in the [place] Do not drive yourself to the hospital. °Summary °· After the procedure, it is common to have bruising that usually fades within 1-2 weeks. °· Check your femoral site every day for signs of infection. °· Do not lift anything that is heavier than 10 lb (4.5 kg), or the   limit that you are told, until your health care provider says that it is safe. This information is not intended to replace advice given to you by your health care provider. Make sure you discuss any questions you have with your health care provider. Document Released: 03/28/2014 Document Revised: 08/07/2017 Document Reviewed: 08/07/2017 Elsevier Patient Education  2020 [employer]  An angiogram is a procedure used to examine the blood vessels. In this procedure, contrast dye is injected through a long, thin tube (catheter) into an artery. X-rays are then  taken, which show if there is a blockage or problem in a blood vessel. The catheter may be inserted in:  Your groin area. This is the most common.  The fold of your arm, near your elbow.  Your wrist. Tell a health care provider about:  Any allergies you have, including allergies to shellfish or contrast dye.  All medicines you are taking, including vitamins, herbs, eye drops, creams, and over-the-counter medicines.  Any problems you or family members have had with anesthetic medicines.  Any blood disorders you have.  Any surgeries you have had.  Any previous kidney problems or failure you have had.  Any medical conditions you have.  Whether you are pregnant or may be pregnant.  Whether you are breastfeeding. What are the risks? Generally, this is a safe procedure. However, problems may occur, including:  Infection or bruising at the catheter area.  Damage to other structures or organs, including rupture of blood vessels or damage to arteries.  Allergic reaction to the contrast dye used.  Kidney damage from the contrast dye used.  Blood clots that can lead to a stroke or heart attack. What happens before the procedure? Staying hydrated Follow instructions from your health care provider about hydration, which may include:  Up to 2 hours before the procedure - you may continue to drink clear liquids, such as water, clear fruit juice, black coffee, and plain tea. Eating and drinking restrictions Follow instructions from your health care provider about eating and drinking, which may include:  8 hours before the procedure - stop eating heavy meals or foods such as meat, fried foods, or fatty foods.  6 hours before the procedure - stop eating light meals or foods, such as toast or cereal.  6 hours before the procedure - stop drinking milk or drinks that contain milk.  2 hours before the procedure - stop drinking clear liquids. General instructions  Ask your health  care provider about: ? Changing or stopping your normal medicines. This is important if you take diabetes medicines or blood thinners. ? Taking medicines such as aspirin and ibuprofen. These medicines can thin your blood. Do not take these medicines before your procedure if your doctor tells you not to.  You may have blood samples taken.  Plan to have someone take you home from the hospital or clinic.  If you will be going home right after the procedure, plan to have someone with you for 24 hours. What happens during the procedure?  To reduce your risk of infection: ? Your health care team will wash or sanitize their hands. ? Your skin will be washed with soap. ? Hair may be removed from the insertion area.  You will lie on your back on an X-ray table. You may be strapped to the table if it is tilted.  An IV tube will be inserted into one of your veins.  Electrodes may be placed on your chest  to monitor your heart rate during the procedure.  You will be given one or more of the following: ? A medicine to help you relax (sedative). ? A medicine to numb the area where the catheter will be inserted (local anesthetic).  The catheter will be inserted into an artery using a guide wire. A type of X-ray (fluoroscopy) will be used to help guide the catheter to the blood vessel to be examined.  A contrast dye will then be injected into the catheter, and X-rays will be taken. The contrast will help to show where any narrowing or blockages are located in the blood vessels. You may feel flushed as the contrast dye is injected.  After the X-ray is complete, the catheter will be removed.  A bandage (dressing) will be placed over the site where the catheter was inserted. Pressure will be applied to help stop any bleeding. The procedure may vary among health care providers and hospitals. What happens after the procedure?  Your blood pressure, heart rate, breathing rate, and blood oxygen level will  be monitored until the medicines you were given have worn off.  You will be kept in bed lying flat for several hours. If the catheter was inserted through your leg, you will be instructed not to bend or cross your legs.  The insertion area and the pulse in your feet or wrist will be checked frequently.  You will be instructed to drink plenty of fluids. This will help wash the contrast dye out of your body.  Additional blood tests and X-rays may be done.  Tests to check the electrical activity in your heart (electrocardiogram) may be done.  Do not drive for 24 hours if you received a sedative.  It is up to you to get the results of your procedure. Ask your health care provider, or the department that is doing the procedure, when your results will be ready. Summary  An angiogram is a procedure used to examine the blood vessels.  In this procedure, contrast dye is injected through a long, thin tube (catheter) into an artery. X-rays are then taken.  Before the procedure, follow your health care provider's instructions about eating and drinking restrictions. You may be asked to stop eating and drinking several hours before the procedure.  After the procedure, you will need to lie flat for several hours and drink plenty of fluids. This information is not intended to replace advice given to you by your health care provider. Make sure you discuss any questions you have with your health care provider. Document Released: 05/04/2005 Document Revised: 07/07/2017 Document Reviewed: 08/31/2016 Elsevier Patient Education  [employer].   Moderate Conscious Sedation, Adult, Care After These instructions provide you with information about caring for yourself after your procedure. Your health care provider may also give you more specific instructions. Your treatment has been planned according to current medical practices, but problems sometimes occur. Call your health care provider if you have any  problems or questions after your procedure. What can I expect after the procedure? After your procedure, it is common:  To feel sleepy for several hours.  To feel clumsy and have poor balance for several hours.  To have poor judgment for several hours.  To vomit if you eat too soon. Follow these instructions at home: For at least 24 hours after the procedure:   Do not: ? Participate in activities where you could fall or become injured. ? Drive. ? Use heavy machinery. ? Drink alcohol. ?  Take sleeping pills or medicines that cause drowsiness. ? Make important decisions or sign legal documents. ? Take care of children on your own.  Rest. Eating and drinking  Follow the diet recommended by your health care provider.  If you vomit: ? Drink water, juice, or soup when you can drink without vomiting. ? Make sure you have little or no nausea before eating solid foods. General instructions  Have a responsible adult stay with you until you are awake and alert.  Take over-the-counter and prescription medicines only as told by your health care provider.  If you smoke, do not smoke without supervision.  Keep all follow-up visits as told by your health care provider. This is important. Contact a health care provider if:  You keep feeling nauseous or you keep vomiting.  You feel light-headed.  You develop a rash.  You have a fever. Get help right away if:  You have trouble breathing. This information is not intended to replace advice given to you by your health care provider. Make sure you discuss any questions you have with your health care provider. Document Released: 05/15/2013 Document Revised: 07/07/2017 Document Reviewed: 11/14/2015 Elsevier Patient Education  2020 [employer].

## 2019-04-05 NOTE — Progress Notes (Signed)
Pt ambulated down the hall, standby assist. Tolerated well, no new bleeding noted.

## 2019-04-05 NOTE — Procedures (Addendum)
Interventional Radiology Procedure Note  Procedure: US guided access left common femoral artery access.   Aortic and bilateral lower extremity  Angiogram/runoff.   No treatment performed today.   Deployment of exoseal.    Complications: None  Recommendations:  - 4 hours leg straight before DC home - Ok to shower tomorrow - Do not submerge for 7 days - Routine care - Follow up with Dr. Earleen Newport in [place] [facility] with directed duplex with ABI    Signed,  Dulcy Fanny. Earleen Newport, DO

## 2019-04-11 ENCOUNTER — Encounter: Payer: Self-pay | Admitting: Sports Medicine

## 2019-04-18 ENCOUNTER — Ambulatory Visit (INDEPENDENT_AMBULATORY_CARE_PROVIDER_SITE_OTHER): Payer: PPO | Admitting: Sports Medicine

## 2019-04-18 ENCOUNTER — Encounter: Payer: Self-pay | Admitting: Sports Medicine

## 2019-04-18 ENCOUNTER — Other Ambulatory Visit: Payer: Self-pay

## 2019-04-18 DIAGNOSIS — I739 Peripheral vascular disease, unspecified: Secondary | ICD-10-CM

## 2019-04-18 DIAGNOSIS — M65311 Trigger thumb, right thumb: Secondary | ICD-10-CM | POA: Diagnosis not present

## 2019-04-18 NOTE — Assessment & Plan Note (Addendum)
History of left calf claudication resolved post angioplasty. Starting to get recurrent claudication, but on the right side. Recent angioplasty with vascular interventional radiology showed several areas of widespread narrowing approximately 50%, some possibly amenable to intervention. He did have worsening ABIs with exertion, but relatively normal at rest. We could certainly consider additional medical therapy with Pletal, he would like to discuss this with his interventional radiologist first which I think is appropriate.

## 2019-04-18 NOTE — Assessment & Plan Note (Signed)
Right flexor pollicis longus tendon sheath injection. Return to see me in a month.

## 2019-04-18 NOTE — Progress Notes (Addendum)
Subjective:    CC: Thumb pain  HPI: Michael Thomas is a pleasant 73 year old male, for the past several weeks has had increasing pain in his right thumb, volar aspect with locking of his interphalangeal joint and flexion.  Severe, persistent, localized without radiation.  In addition he started to have recurrence of claudication.  See below for further details.  I reviewed the past medical history, family history, social history, surgical history, and allergies today and no changes were needed.  Please see the problem list section below in epic for further details.  Past Medical History: Past Medical History:  Diagnosis Date  . Benign prostatic hyperplasia with urinary obstruction 03/25/2015  . Diabetes mellitus without complication ([place])   . Hyperlipidemia   . Kidney stone   . OSA (obstructive sleep apnea)   . Vitamin D deficiency    Past Surgical History: Past Surgical History:  Procedure Laterality Date  . DG ARTHRO THUMB*L*    . IR ANGIOGRAM EXTREMITY BILATERAL  02/20/2018  . IR ANGIOGRAM EXTREMITY BILATERAL  04/05/2019  . IR ANGIOGRAM SELECTIVE EACH ADDITIONAL VESSEL  02/20/2018  . IR FEM POP ART ATHERECT INC PTA MOD SED  02/20/2018  . IR RADIOLOGIST EVAL & MGMT  02/01/2018  . IR RADIOLOGIST EVAL & MGMT  03/20/2018  . IR RADIOLOGIST EVAL & MGMT  07/25/2018  . IR RADIOLOGIST EVAL & MGMT  03/07/2019  . IR US GUIDE VASC ACCESS LEFT  04/05/2019  . IR US GUIDE VASC ACCESS RIGHT  02/20/2018  . NECK SURGERY    . VASECTOMY     Social History: Social History   Socioeconomic History  . Marital status: Married    Spouse name: Not on file  . Number of children: Not on file  . Years of education: Not on file  . Highest education level: Not on file  Occupational History  . Not on file  Social Needs  . Financial resource strain: Not on file  . Food insecurity    Worry: Not on file    Inability: Not on file  . Transportation needs    Medical: Not on file    Non-medical: Not on file   Tobacco Use  . Smoking status: Never Smoker  . Smokeless tobacco: Never Used  Substance and Sexual Activity  . Alcohol use: No  . Drug use: No  . Sexual activity: Yes    Birth control/protection: None  Lifestyle  . Physical activity    Days per week: Not on file    Minutes per session: Not on file  . Stress: Not on file  Relationships  . Social Herbalist on phone: Not on file    Gets together: Not on file    Attends religious service: Not on file    Active member of club or organization: Not on file    Attends meetings of clubs or organizations: Not on file    Relationship status: Not on file  Other Topics Concern  . Not on file  Social History Narrative  . Not on file   Family History: Family History  Problem Relation Age of Onset  . Stroke Mother   . Diabetes Mellitus II Mother   . Prostate cancer Neg Hx   . Kidney cancer Neg Hx   . Bladder Cancer Neg Hx    Allergies: Allergies  Allergen Reactions  . Amoxicillin Other (See Comments)    Fatigue and Myalgia and Rigors x2 attempts   Medications: See med rec.  Review  of Systems: No fevers, chills, night sweats, weight loss, chest pain, or shortness of breath.   Objective:    General: Well Developed, well nourished, and in no acute distress.  Neuro: Alert and oriented x3, extra-ocular muscles intact, sensation grossly intact.  HEENT: Normocephalic, atraumatic, pupils equal round reactive to light, neck supple, no masses, no lymphadenopathy, thyroid nonpalpable.  Skin: Warm and dry, no rashes. Cardiac: Regular rate and rhythm, no murmurs rubs or gallops, no lower extremity edema.  Respiratory: Clear to auscultation bilaterally. Not using accessory muscles, speaking in full sentences. Right hand: Flexor tenosynovitis, stenosing of the right flexor pollicis longus  Procedure: Real-time Ultrasound Guided injection of the right flexor pollicis longus tendon sheath Device: GE Logiq E  Verbal informed  consent obtained.  Time-out conducted.  Noted no overlying erythema, induration, or other signs of local infection.  Skin prepped in a sterile fashion.  Local anesthesia: Topical Ethyl chloride.  With sterile technique and under real time ultrasound guidance:  Noted tendon sheath effusion, 1/2 cc Kenalog 40, 1/2 cc lidocaine injected easily. Completed without difficulty  Pain immediately resolved suggesting accurate placement of the medication.  Advised to call if fevers/chills, erythema, induration, drainage, or persistent bleeding.  Images permanently stored and available for review in the ultrasound unit.  Impression: Technically successful ultrasound guided injection.  Impression and Recommendations:    Left calf claudication resolved post angioplasty History of left calf claudication resolved post angioplasty. Starting to get recurrent claudication, but on the right side. Recent angioplasty with vascular interventional radiology showed several areas of widespread narrowing approximately 50%, some possibly amenable to intervention. He did have worsening ABIs with exertion, but relatively normal at rest. We could certainly consider additional medical therapy with Pletal, he would like to discuss this with his interventional radiologist first which I think is appropriate.  Trigger thumb, right thumb Right flexor pollicis longus tendon sheath injection. Return to see me in a month.   ___________________________________________ Gwen Her. Dianah Field, M.D., ABFM., CAQSM. Primary Care and Sports Medicine [facility]  Adjunct Professor of [employer]

## 2019-04-23 ENCOUNTER — Other Ambulatory Visit: Payer: Self-pay | Admitting: *Deleted

## 2019-04-23 ENCOUNTER — Encounter: Payer: Self-pay | Admitting: *Deleted

## 2019-04-23 ENCOUNTER — Ambulatory Visit
Admission: RE | Admit: 2019-04-23 | Discharge: 2019-04-23 | Disposition: A | Discharge status: Home / Self Care | Payer: PPO | Source: Ambulatory Visit | Attending: Interventional Radiology | Admitting: Interventional Radiology

## 2019-04-23 DIAGNOSIS — I739 Peripheral vascular disease, unspecified: Secondary | ICD-10-CM

## 2019-04-23 HISTORY — PX: IR RADIOLOGIST EVAL & MGMT: IMG5224

## 2019-04-23 NOTE — Progress Notes (Addendum)
Chief Complaint: Lower extremity claudication  Referring Physician(s): Dr. Dianah Field  History of Present Illness: Michael Thomas is a 73 y.o. male presenting as a scheduled follow up to [place] [facility] to discuss his ongoing claudication, diagnostic arteriogram, and today's non-invasive/duplex exam.   Michael Thomas is a gentleman with known PAD, with prior treatment of left lower extremity claudication 02/20/2018 with directional atherectomy and drug-eluting balloon angioplasty of critical stenosis of the SFA.  He had excellent result with resolution of symptoms.   He then had development of right lower extremity short distance claudication, Rutherford 3, for which we initiated workup with resting/post-exercise ABI and segmental exam.  This showed a right ABI drop of 0.93 to 0.39 after 1 minute of exercise, and a more mild drop on the left from 0.99 to 0.55.    Angiogram was then done 04/05/2019, which showed bilateral fem-pop disease, though did not precisely identify a right sided target lesion.  Today's duplex shows a velocity of ~190cm/sec in the distal right SFA in a region of shadowing, as the likely source.      Past Medical History:  Diagnosis Date   Benign prostatic hyperplasia with urinary obstruction 03/25/2015   Diabetes mellitus without complication ([place])    Hyperlipidemia    Kidney stone    OSA (obstructive sleep apnea)    Vitamin D deficiency     Past Surgical History:  Procedure Laterality Date   DG ARTHRO THUMB*L*     IR ANGIOGRAM EXTREMITY BILATERAL  02/20/2018   IR ANGIOGRAM EXTREMITY BILATERAL  04/05/2019   IR ANGIOGRAM SELECTIVE EACH ADDITIONAL VESSEL  02/20/2018   IR FEM POP ART ATHERECT INC PTA MOD SED  02/20/2018   IR RADIOLOGIST EVAL & MGMT  02/01/2018   IR RADIOLOGIST EVAL & MGMT  03/20/2018   IR RADIOLOGIST EVAL & MGMT  07/25/2018   IR RADIOLOGIST EVAL & MGMT  03/07/2019   IR RADIOLOGIST EVAL & MGMT  04/23/2019   IR US GUIDE VASC  ACCESS LEFT  04/05/2019   IR US GUIDE VASC ACCESS RIGHT  02/20/2018   NECK SURGERY     VASECTOMY      Allergies: Amoxicillin  Medications: Prior to Admission medications   Medication Sig Start Date End Date Taking? Authorizing Provider  aspirin EC 81 MG tablet Take 81 mg by mouth daily.     [provider]  fenofibrate 160 MG tablet Take 1 tablet (160 mg total) by mouth daily. 07/26/18   Silverio Decamp, MD  glipiZIDE (GLUCOTROL) 10 MG tablet TAKE 1 TABLET BY MOUTH TWICE A DAY BEFORE MEALS 10/19/18   Silverio Decamp, MD  Lancets [facility] ULTRASOFT) lancets Use as instructed 02/27/19   Silverio Decamp, MD  lisinopril (PRINIVIL,ZESTRIL) 20 MG tablet Take 1 tablet (20 mg total) by mouth daily. 10/22/18   Silverio Decamp, MD  metFORMIN (GLUCOPHAGE) 1000 MG tablet Take 1 tablet (1,000 mg total) by mouth 2 (two) times daily with a meal. 02/22/19   Silverio Decamp, MD  omega-3 acid ethyl esters (LOVAZA) 1 g capsule Take 2 capsules (2 g total) by mouth 2 (two) times daily. 07/26/18   Silverio Decamp, MD  simvastatin (ZOCOR) 40 MG tablet Take 1 tablet (40 mg total) by mouth daily at 6 PM. 07/12/18   Silverio Decamp, MD  sitaGLIPtin (JANUVIA) 50 MG tablet Take 1 tablet (50 mg total) by mouth daily. 07/25/18   Silverio Decamp, MD  tamsulosin (FLOMAX) 0.4 MG CAPS capsule  TAKE 2 CAPSULES BY MOUTH EVERY DAY 01/23/19   Silverio Decamp, MD     Family History  Problem Relation Age of Onset   Stroke Mother    Diabetes Mellitus II Mother    Prostate cancer Neg Hx    Kidney cancer Neg Hx    Bladder Cancer Neg Hx     Social History   Socioeconomic History   Marital status: Married    Spouse name: Not on file   Number of children: Not on file   Years of education: Not on file   Highest education level: Not on file  Occupational History   Not on file  Social Needs   Financial resource strain: Not on file   Food  insecurity    Worry: Not on file    Inability: Not on file   Transportation needs    Medical: Not on file    Non-medical: Not on file  Tobacco Use   Smoking status: Never Smoker   Smokeless tobacco: Never Used  Substance and Sexual Activity   Alcohol use: No   Drug use: No   Sexual activity: Yes    Birth control/protection: None  Lifestyle   Physical activity    Days per week: Not on file    Minutes per session: Not on file   Stress: Not on file  Relationships   Social connections    Talks on phone: Not on file    Gets together: Not on file    Attends religious service: Not on file    Active member of club or organization: Not on file    Attends meetings of clubs or organizations: Not on file    Relationship status: Not on file  Other Topics Concern   Not on file  Social History Narrative   Not on file      Review of Systems: A 12 point ROS discussed and pertinent positives are indicated in the HPI above.  All other systems are negative.  Review of Systems  Vital Signs: There were no vitals taken for this visit.  Physical Exam General: 73 yo male appearing older than stated age.  Well-developed, well-nourished.  No distress. HEENT: Atraumatic, normocephalic. Glasses.  Conjugate gaze, extra-ocular motor intact. No scleral icterus or scleral injection. No lesions on external ears, nose, lips, or gums.  Oral mucosa moist, pink.  Neck: Symmetric with no goiter enlargement.  Chest/Lungs:  Symmetric chest with inspiration/expiration.  No labored breathing.   Marland Kitchen  Heart:   No JVD appreciated.  Abdomen:  Soft, NT/ND, with + bowel sounds.   Genito-urinary: Deferred Neurologic: Alert & Oriented to person, place, and time.   Normal affect and insight.  Appropriate questions.  Moving all 4 extremities with gross sensory intact.  Pulse Exam:  Doppler positive left and right PT/DP Extremities:  No wounds.  Imaging: Ir Angiogram Extremity Bilateral  Result Date:  04/05/2019 INDICATION: 73 year old male with a history of vascular disease, prior left-sided treatment, and recurrent bilateral claudication Prior ABI 03/07/2019 EXAM: ULTRASOUND-GUIDED ACCESS LEFT COMMON FEMORAL ARTERY PELVIC ANGIOGRAM AND RUNOFF OF THE BILATERAL LOWER EXTREMITIES MEDICATIONS: None ANESTHESIA/SEDATION: Moderate (conscious) sedation was employed during this procedure. A total of Versed 1 mg and Fentanyl 50 mcg was administered intravenously. Moderate Sedation Time: 45 minutes. The patient's level of consciousness and vital signs were monitored continuously by radiology nursing throughout the procedure under my direct supervision. CONTRAST:  100 cc FLUOROSCOPY TIME:  Fluoroscopy Time: 1 minutes 12 seconds (22 mGy). COMPLICATIONS: None  PROCEDURE: Informed consent was obtained from the patient following explanation of the procedure, risks, benefits and alternatives. The patient understands, agrees and consents for the procedure. All questions were addressed. A time out was performed prior to the initiation of the procedure. Maximal barrier sterile technique utilized including caps, mask, sterile gowns, sterile gloves, large sterile drape, hand hygiene, and Betadine prep. Ultrasound survey of the left inguinal region was performed with images stored and sent to PACs, confirming patency of the vessel. 1% lidocaine was used for local anesthesia. Small stab incision was made with 11 blade scalpel. Blunt dissection was performed with ultrasound guidance. A micropuncture needle was used access the left common femoral artery under ultrasound. With excellent arterial blood flow returned, and an .018 micro wire was passed through the needle, observed enter the abdominal aorta under fluoroscopy. The needle was removed, and a micropuncture sheath was placed over the wire. The inner dilator and wire were removed, and an 035 Bentson wire was advanced under fluoroscopy into the abdominal aorta. The sheath was  removed and a standard 5 [place] vascular sheath was placed. The dilator was removed and the sheath was flushed. Omni Flush catheter was advanced into the abdominal aorta. Pelvic and bilateral lower extremity angiogram was then performed. Images were reviewed. Omni Flush catheter was removed on the Bentson wire. Angiogram of the left lower extremity was then performed to observe the distal SFA and trifurcation with multiple obliquities from the left sheath. Exoseal was then deployed at the left common femoral artery. Patient tolerated the procedure well and remained hemodynamically stable throughout. No complications were encountered and no significant blood loss. FINDINGS: Ultrasound demonstrates patent left common femoral artery with mild atherosclerotic changes. Lower abdominal aorta with calcified atherosclerotic plaque without significant stenosis. No aneurysm. Left common iliac artery demonstrates less than 50% narrowing at the origin of the common iliac artery with mild tortuosity. Mild atherosclerosis of the left iliac system. Hypogastric artery is patent as well as the pelvic branches. Mild atherosclerotic changes of the right iliac system with no high-grade stenosis or occlusion. Mild tortuosity. Hypogastric artery remains patent as well as the pelvic branches. The left common femoral artery demonstrates mild atherosclerosis with no significant stenosis. Profunda femoris is patent as well as the thigh branches. Mild to moderate atherosclerotic changes of the left superficial femoral artery. The previously treated segment in the abductor canal demonstrates patency with lucency representative of residual plaque and no evidence on the CT angiogram of high-grade stenosis. Symmetric flow through the femoropopliteal system compared to the right. Popliteal artery patent without high-grade stenosis. Trifurcation patent. The anterior tibial artery is the dominant artery to the ankle. Peroneal artery is patent to the  ankle. There is slow flow through the posterior tibial artery though this appears patent to the ankle, with collateral filling distally via the peroneal artery medial collaterals. The right common femoral artery demonstrates mild atherosclerotic changes without high-grade stenosis. Profunda femoris is patent as well as the thigh branches. Moderate to advanced atherosclerotic changes of the right femoropopliteal system. There is focal narrowing just after the origin with 50% or less estimated stenosis on the angiogram. There is short to medium segment irregularity in the adductor canal secondary to atherosclerotic plaque. Symmetric flow through the femoropopliteal system of the right and left. Trifurcation is patent. There is estimated 50% narrowing of the origin of the posterior tibial artery. Anterior tibial artery and posterior tibial artery are co-dominant to the ankle. Peroneal artery patent throughout its length. IMPRESSION: Status  post ultrasound-guided access of left common femoral artery, with pelvic and lower extremity runoff angiogram. Signed, Dulcy Fanny. Dellia Nims, RPVI [facility] Radiology Electronically Signed   By: Corrie Mckusick D.O.   On: 04/05/2019 11:49   US Arterial Lower Extremity Duplex Bilateral  Result Date: 04/23/2019 CLINICAL DATA:  73 year old male with peripheral arterial disease history of prior angioplasty on 02/20/2018 EXAM: BILATERAL LOWER EXTREMITY ARTERIAL DUPLEX SCAN TECHNIQUE: Gray-scale sonography as well as color Doppler and duplex ultrasound was performed to evaluate the arteries of both lower extremities including the common, superficial and profunda femoral arteries, popliteal artery and calf arteries. COMPARISON:  Prior lower extremity arterial study 03/07/2019 FINDINGS: Right Lower Extremity ABI: 0.9 Inflow: Normal common femoral arterial waveforms and velocities. No evidence of inflow (aortoiliac) disease. Outflow: Normal  profunda femoral, superficial femoral and popliteal arterial waveforms and velocities. No focal elevation of the PSV to suggest stenosis. Runoff: The anterior and posterior tibial arteries are patent proximally. Left Lower Extremity ABI: 1.0 Inflow: Normal common femoral arterial waveforms and velocities. No evidence of inflow (aortoiliac) disease. Outflow: Normal profunda femoral, superficial femoral and popliteal arterial waveforms and velocities. No focal elevation of the PSV to suggest stenosis. Runoff: The anterior and posterior tibial arteries are patent proximally. IMPRESSION: 1. Normal resting left ankle-brachial index. 2. Mildly abnormal resting right ankle-brachial index 0.9 consistent with at least mild underlying peripheral arterial disease. 3. Scattered heterogeneous atherosclerotic plaque without evidence of high-grade stenosis or occlusion in either lower extremity to the level of the knees. 4. Bilateral runoff disease. Signed, Criselda Peaches, MD, [place] [facility] Radiology Electronically Signed   By: Jacqulynn Cadet M.D.   On: 04/23/2019 10:04   Ir US Guide Vasc Access Left  Result Date: 04/05/2019 INDICATION: 73 year old male with a history of vascular disease, prior left-sided treatment, and recurrent bilateral claudication Prior ABI 03/07/2019 EXAM: ULTRASOUND-GUIDED ACCESS LEFT COMMON FEMORAL ARTERY PELVIC ANGIOGRAM AND RUNOFF OF THE BILATERAL LOWER EXTREMITIES MEDICATIONS: None ANESTHESIA/SEDATION: Moderate (conscious) sedation was employed during this procedure. A total of Versed 1 mg and Fentanyl 50 mcg was administered intravenously. Moderate Sedation Time: 45 minutes. The patient's level of consciousness and vital signs were monitored continuously by radiology nursing throughout the procedure under my direct supervision. CONTRAST:  100 cc FLUOROSCOPY TIME:  Fluoroscopy Time: 1 minutes 12 seconds (22 mGy). COMPLICATIONS: None  PROCEDURE: Informed consent was obtained from the patient following explanation of the procedure, risks, benefits and alternatives. The patient understands, agrees and consents for the procedure. All questions were addressed. A time out was performed prior to the initiation of the procedure. Maximal barrier sterile technique utilized including caps, mask, sterile gowns, sterile gloves, large sterile drape, hand hygiene, and Betadine prep. Ultrasound survey of the left inguinal region was performed with images stored and sent to PACs, confirming patency of the vessel. 1% lidocaine was used for local anesthesia. Small stab incision was made with 11 blade scalpel. Blunt dissection was performed with ultrasound guidance. A micropuncture needle was used access the left common femoral artery under ultrasound. With excellent arterial blood flow returned, and an .018 micro wire was passed through the needle, observed enter the abdominal aorta under fluoroscopy. The needle was removed, and a micropuncture sheath was placed over the wire. The inner dilator and wire were removed, and an 035 Bentson wire was advanced under fluoroscopy into the abdominal aorta. The sheath was removed and a standard 5 [place] vascular sheath was placed.  The dilator was removed and the sheath was flushed. Omni Flush catheter was advanced into the abdominal aorta. Pelvic and bilateral lower extremity angiogram was then performed. Images were reviewed. Omni Flush catheter was removed on the Bentson wire. Angiogram of the left lower extremity was then performed to observe the distal SFA and trifurcation with multiple obliquities from the left sheath. Exoseal was then deployed at the left common femoral artery. Patient tolerated the procedure well and remained hemodynamically stable throughout. No complications were encountered and no significant blood loss. FINDINGS: Ultrasound demonstrates patent left common femoral artery with mild atherosclerotic  changes. Lower abdominal aorta with calcified atherosclerotic plaque without significant stenosis. No aneurysm. Left common iliac artery demonstrates less than 50% narrowing at the origin of the common iliac artery with mild tortuosity. Mild atherosclerosis of the left iliac system. Hypogastric artery is patent as well as the pelvic branches. Mild atherosclerotic changes of the right iliac system with no high-grade stenosis or occlusion. Mild tortuosity. Hypogastric artery remains patent as well as the pelvic branches. The left common femoral artery demonstrates mild atherosclerosis with no significant stenosis. Profunda femoris is patent as well as the thigh branches. Mild to moderate atherosclerotic changes of the left superficial femoral artery. The previously treated segment in the abductor canal demonstrates patency with lucency representative of residual plaque and no evidence on the CT angiogram of high-grade stenosis. Symmetric flow through the femoropopliteal system compared to the right. Popliteal artery patent without high-grade stenosis. Trifurcation patent. The anterior tibial artery is the dominant artery to the ankle. Peroneal artery is patent to the ankle. There is slow flow through the posterior tibial artery though this appears patent to the ankle, with collateral filling distally via the peroneal artery medial collaterals. The right common femoral artery demonstrates mild atherosclerotic changes without high-grade stenosis. Profunda femoris is patent as well as the thigh branches. Moderate to advanced atherosclerotic changes of the right femoropopliteal system. There is focal narrowing just after the origin with 50% or less estimated stenosis on the angiogram. There is short to medium segment irregularity in the adductor canal secondary to atherosclerotic plaque. Symmetric flow through the femoropopliteal system of the right and left. Trifurcation is patent. There is estimated 50% narrowing of the  origin of the posterior tibial artery. Anterior tibial artery and posterior tibial artery are co-dominant to the ankle. Peroneal artery patent throughout its length. IMPRESSION: Status post ultrasound-guided access of left common femoral artery, with pelvic and lower extremity runoff angiogram. Signed, Dulcy Fanny. Dellia Nims, RPVI [facility] Radiology Electronically Signed   By: Corrie Mckusick D.O.   On: 04/05/2019 11:49   Ir Radiologist Eval & Mgmt  Result Date: 04/23/2019 Please refer to notes tab for details about interventional procedure. (Op Note)   Labs:  CBC: Recent Labs    07/25/18 0944 04/05/19 0735  WBC 6.8 5.6  HGB 14.8 13.0  HCT 43.7 39.7  PLT 373 312    COAGS: Recent Labs    04/05/19 0735  INR 1.0    BMP: Recent Labs    07/25/18 0944 10/22/18 1138 04/05/19 0735  NA 136 137 136  K 4.6 5.2 4.6  CL 104 104 107  CO2 23 24 20*  GLUCOSE 201* 161* 160*  BUN 22 27* 23  CALCIUM 9.3 10.1 9.2  CREATININE 1.28* 1.41* 1.24  GFRNONAA  --   --  57*  GFRAA  --   --  >60    LIVER FUNCTION TESTS:  Recent Labs    07/25/18 0944 10/22/18 1138  BILITOT 0.3 0.3  AST 22 23  ALT 31 28  PROT 6.9 7.3    TUMOR MARKERS: No results for input(s): AFPTM, CEA, CA199, CHROMGRNA in the last 8760 hours.  Assessment and Plan:  Michael Thomas is 73 yo male with life-style limiting right>left short distance claudication, Rutherford 3.   His diagnostic work-up reveals a distal right SFA lesion as the most likely source of his right sided symptoms.  It looks like the velocity is measured ~190cm/sec at this site of calcification, which is likely underestimating the degree of stenosis.   He remains very motivated to feel better, as he is a gentleman who wants to stay very comfortably active.  He tells me that he has decreased his activity given the COVID situation and his symptoms, but wants to continue to work out.  He would like to continue  comfortably.   We did discuss a trial of pletal/cilostazol, which has shown benefit with walking distance (PMID: 16109604), though generally the benefit is after 3-6 months of supervised walking program.  I did tell him that given his motivation, he would be a good candidate.    Side effects of GI upset, HA, and dizziness were discussed.    He did let me know that he would like to feel better and is motivated, so that he prefers to be treated with intervention, sooner rather than later.  He would like to review some codes with his insurance, as he is very aware of the financial toxicity of medical treatment.   In addition, we discussed exercise.  He enjoys walking with his wife, but also finds benefit with a treadmill at a faster rate of speed.  Unfortunately, his ability to use treadmill right now is limited given the COVID situation and closed gyms.  He would like to use the [facility] gym.    Plan: - He would like proceed with treatment, though would like to first review treatment codes with his insurance company before deciding on when to schedule his intervention.  The codes he would need include those for femoral artery angioplasty/atherectomy/stent placement unilateral (37224, [id number], [id number]).  We would proceed at his convenience. - If we proceed after January 1st 2021, we would offer him prescription for pletal - He would like to use the [facility] wellness facility for exercise, for which I am happy to give him a prescription for exercise.  We can fax "Walking Program" to [place] at the [facility] facility, [phone].  Phone number is [phone] - I have advised him to observe all his other physician appointments.     Electronically Signed: Corrie Mckusick 04/23/2019, 10:30 AM   I spent a total of    25 Minutes in face to face in clinical consultation, greater than 50% of which was counseling/coordinating care for PAD, bilateral short distance claudication, possible angioplasty and treatment.

## 2019-04-27 ENCOUNTER — Other Ambulatory Visit: Payer: Self-pay | Admitting: Sports Medicine

## 2019-04-27 DIAGNOSIS — E1169 Type 2 diabetes mellitus with other specified complication: Secondary | ICD-10-CM

## 2019-04-27 DIAGNOSIS — E118 Type 2 diabetes mellitus with unspecified complications: Secondary | ICD-10-CM

## 2019-04-30 ENCOUNTER — Other Ambulatory Visit (HOSPITAL_COMMUNITY): Payer: Self-pay | Admitting: Interventional Radiology

## 2019-04-30 DIAGNOSIS — I739 Peripheral vascular disease, unspecified: Secondary | ICD-10-CM

## 2019-05-13 ENCOUNTER — Other Ambulatory Visit: Payer: Self-pay | Admitting: Radiology

## 2019-05-14 ENCOUNTER — Ambulatory Visit (HOSPITAL_COMMUNITY)
Admission: RE | Admit: 2019-05-14 | Discharge: 2019-05-14 | Disposition: A | Discharge status: Home / Self Care | Payer: PPO | Source: Ambulatory Visit | Attending: Interventional Radiology | Admitting: Interventional Radiology

## 2019-05-14 ENCOUNTER — Other Ambulatory Visit (HOSPITAL_COMMUNITY): Payer: Self-pay | Admitting: Interventional Radiology

## 2019-05-14 ENCOUNTER — Other Ambulatory Visit: Payer: Self-pay

## 2019-05-14 ENCOUNTER — Encounter (HOSPITAL_COMMUNITY): Payer: Self-pay

## 2019-05-14 DIAGNOSIS — I739 Peripheral vascular disease, unspecified: Secondary | ICD-10-CM

## 2019-05-14 DIAGNOSIS — E559 Vitamin D deficiency, unspecified: Secondary | ICD-10-CM | POA: Diagnosis not present

## 2019-05-14 DIAGNOSIS — Z7984 Long term (current) use of oral hypoglycemic drugs: Secondary | ICD-10-CM | POA: Insufficient documentation

## 2019-05-14 DIAGNOSIS — E1151 Type 2 diabetes mellitus with diabetic peripheral angiopathy without gangrene: Secondary | ICD-10-CM | POA: Diagnosis not present

## 2019-05-14 DIAGNOSIS — G4733 Obstructive sleep apnea (adult) (pediatric): Secondary | ICD-10-CM | POA: Diagnosis not present

## 2019-05-14 DIAGNOSIS — Z7982 Long term (current) use of aspirin: Secondary | ICD-10-CM | POA: Diagnosis not present

## 2019-05-14 DIAGNOSIS — E785 Hyperlipidemia, unspecified: Secondary | ICD-10-CM | POA: Insufficient documentation

## 2019-05-14 DIAGNOSIS — Z79899 Other long term (current) drug therapy: Secondary | ICD-10-CM | POA: Insufficient documentation

## 2019-05-14 HISTORY — PX: IR FEM POP ART ATHERECT INC PTA MOD SED: IMG2310

## 2019-05-14 HISTORY — PX: IR US GUIDE VASC ACCESS RIGHT: IMG2390

## 2019-05-14 LAB — CBC
HCT: 46.2 % (ref 39.0–52.0)
Hemoglobin: 15.4 g/dL (ref 13.0–17.0)
MCH: 29.4 pg (ref 26.0–34.0)
MCHC: 33.3 g/dL (ref 30.0–36.0)
MCV: 88.3 fL (ref 80.0–100.0)
Platelets: 328 10*3/uL (ref 150–400)
RBC: 5.23 MIL/uL (ref 4.22–5.81)
RDW: 13.4 % (ref 11.5–15.5)
WBC: 6.3 10*3/uL (ref 4.0–10.5)
nRBC: 0 % (ref 0.0–0.2)

## 2019-05-14 LAB — PROTIME-INR
INR: 1.1 (ref 0.8–1.2)
Prothrombin Time: 13.6 seconds (ref 11.4–15.2)

## 2019-05-14 LAB — BASIC METABOLIC PANEL
Anion gap: 10 (ref 5–15)
BUN: 24 mg/dL — ABNORMAL HIGH (ref 8–23)
CO2: 25 mmol/L (ref 22–32)
Calcium: 9.9 mg/dL (ref 8.9–10.3)
Chloride: 103 mmol/L (ref 98–111)
Creatinine, Ser: 1.42 mg/dL — ABNORMAL HIGH (ref 0.61–1.24)
GFR calc Af Amer: 56 mL/min — ABNORMAL LOW (ref >60)
GFR calc non Af Amer: 48 mL/min — ABNORMAL LOW (ref >60)
Glucose, Bld: 172 mg/dL — ABNORMAL HIGH (ref 70–99)
Potassium: 4.6 mmol/L (ref 3.5–5.1)
Sodium: 138 mmol/L (ref 135–145)

## 2019-05-14 LAB — GLUCOSE, CAPILLARY
Glucose-Capillary: 163 mg/dL — ABNORMAL HIGH (ref 70–99)
Glucose-Capillary: 173 mg/dL — ABNORMAL HIGH (ref 70–99)

## 2019-05-14 MED ORDER — FENTANYL CITRATE (PF) 100 MCG/2ML IJ SOLN
INTRAMUSCULAR | Status: AC
  Filled 2019-05-14: qty 2

## 2019-05-14 MED ORDER — MIDAZOLAM HCL 2 MG/2ML IJ SOLN
INTRAMUSCULAR | Status: AC
  Filled 2019-05-14: qty 2

## 2019-05-14 MED ORDER — HEPARIN SODIUM (PORCINE) 1000 UNIT/ML IJ SOLN
INTRAMUSCULAR | Status: AC
  Filled 2019-05-14: qty 1

## 2019-05-14 MED ORDER — CLOPIDOGREL BISULFATE 75 MG PO TABS
ORAL_TABLET | ORAL | Status: AC | PRN
  Administered 2019-05-14: 300 mg via ORAL

## 2019-05-14 MED ORDER — FENTANYL CITRATE (PF) 100 MCG/2ML IJ SOLN
INTRAMUSCULAR | Status: AC | PRN
  Administered 2019-05-14 (×2): 25 ug via INTRAVENOUS
  Administered 2019-05-14: 50 ug via INTRAVENOUS

## 2019-05-14 MED ORDER — IODIXANOL 320 MG/ML IV SOLN
100.0000 mL | Freq: Once | INTRAVENOUS | Status: AC | PRN
  Administered 2019-05-14: 10:00:00 60 mL via INTRA_ARTERIAL

## 2019-05-14 MED ORDER — PROTAMINE SULFATE 10 MG/ML IV SOLN
INTRAVENOUS | Status: AC | PRN
  Administered 2019-05-14: 50 mg via INTRAVENOUS

## 2019-05-14 MED ORDER — MIDAZOLAM HCL 2 MG/2ML IJ SOLN
INTRAMUSCULAR | Status: AC | PRN
  Administered 2019-05-14: 1 mg via INTRAVENOUS
  Administered 2019-05-14 (×2): 0.5 mg via INTRAVENOUS

## 2019-05-14 MED ORDER — SODIUM CHLORIDE 0.9 % IV SOLN: INTRAVENOUS | Status: DC

## 2019-05-14 MED ORDER — HEPARIN SODIUM (PORCINE) 1000 UNIT/ML IJ SOLN
INTRAMUSCULAR | Status: AC | PRN
  Administered 2019-05-14: 7000 [IU] via INTRAVENOUS

## 2019-05-14 MED ORDER — PROTAMINE SULFATE 10 MG/ML IV SOLN
50.0000 mg | Freq: Once | INTRAVENOUS | Status: DC
  Filled 2019-05-14: qty 5

## 2019-05-14 MED ORDER — LIDOCAINE HCL 1 % IJ SOLN
INTRAMUSCULAR | Status: AC
  Filled 2019-05-14: qty 20

## 2019-05-14 MED ORDER — CLOPIDOGREL BISULFATE 75 MG PO TABS
ORAL_TABLET | ORAL | Status: AC
  Filled 2019-05-14: qty 4

## 2019-05-14 NOTE — Discharge Instructions (Signed)
Endovascular Therapy for Peripheral Arterial Disease, Care After This sheet gives you information about how to care for yourself after your procedure. Your health care provider may also give you more specific instructions. If you have problems or questions, contact your health care provider. What can I expect after the procedure? After the procedure, it is common to have:  Pain.  Soreness and bruising around your puncture or incision (access site).  Fatigue. Follow these instructions at home: Access site care  Follow instructions from your health care provider about how to take care of your access site. Make sure you: ? Wash your hands with soap and water before you change your bandage (dressing). If soap and water are not available, use hand sanitizer. ? Change your dressing as told by your health care provider. ? Leave stitches (sutures), skin glue, or adhesive strips in place. If adhesive strip edges start to loosen and curl up, you may trim the loose edges. Do not remove adhesive strips or skin glue completely unless your health care provider tells you to do that.  Check your access site every day for signs of infection. Check for: ? Redness, swelling, or pain. ? A lump or bump. ? Fluid or blood. ? Warmth. ? Pus or a bad smell. Medicines  Take over-the-counter and prescription medicines only as told by your health care provider. You may need to take medicines to prevent blood clots and to lower your cholesterol.  If you were prescribed antibiotic medicine, take it as told by your health care provider. Do not stop taking the antibiotic even if you start to feel better. Driving  Do not drive until your health care provider approves. You should: ? Not drive for 24 hours if you were given a medicine to help you relax (sedative) during your procedure. ? Not drive or use heavy machinery while taking prescription pain medicine. Activity  Do not lift anything that is heavier than  10 lb (4.5 kg) until your health care provider says that it is safe. You may have this lifting limit for several days.  Return to your normal activities as told by your health care provider. Ask your health care provider what activities are safe for you. ? Avoid activity that requires a lot of energy, such as exercise and sports, as told by your health care provider. ? Avoid sexual activity until your health care provider says it is safe.  Follow your exercise plan as told by your health care provider. Eating and drinking   Drink fluids as instructed to help wash (flush) dye used during the procedure out of your body.  Follow instructions from your health care provider about eating or drinking restrictions. You may need to eat a diet that is low in salt (sodium) and fat.  Avoid drinking alcohol. General instructions  Do not take baths, swim, or use a hot tub until your health care provider approves. You may take showers.  Do not use any products that contain nicotine or tobacco, such as cigarettes and e-cigarettes. If you need help quitting, ask your health care provider.  Keep all follow-up visits as told by your health care provider. This is important. Contact a health care provider if:  You have a fever.  You have severe pain that does not get better with medicine.  You have redness, swelling, or pain around your access site.  You have a fever.  You have a lump or bump at your access site. Get help right away if:  You have fluid or blood coming from your access site. If this happens, lie down on your back and apply pressure to the area.  You have chest pain.  You have problems breathing.  You have pain, numbness, or tingling in your legs.  You faint.  You have any symptoms of a stroke. "BE FAST" is an easy way to remember the main warning signs of a stroke: ? B - Balance. Signs are dizziness, sudden trouble walking, or loss of balance. ? E - Eyes. Signs are trouble  seeing or a sudden change in vision. ? F - Face. Signs are sudden weakness or numbness of the face, or the face or eyelid drooping on one side. ? A - Arms. Signs are weakness or numbness in an arm. This happens suddenly and usually on one side of the body. ? S - Speech. Signs are sudden trouble speaking, slurred speech, or trouble understanding what people say. ? T - Time. Time to call emergency services. Write down what time symptoms started.  You have other signs of a stroke, such as: ? A sudden, severe headache with no known cause. ? Nausea or vomiting. ? Seizure. These symptoms may represent a serious problem that is an emergency. Do not wait to see if the symptoms will go away. Get medical help right away. Call your local emergency services (911 in the [place] Do not drive yourself to the hospital. Summary  After the procedure, it is common to have pain and soreness near your puncture or incision (access site).  Check your access site every day for signs of infection, such as redness, swelling, or pain.  You may need to take medicines to prevent blood clots and to lower your cholesterol.  If you have any signs of a stroke, get help right away. This information is not intended to replace advice given to you by your health care provider. Make sure you discuss any questions you have with your health care provider. Document Released: 11/16/2016 Document Revised: 07/20/2018 Document Reviewed: 11/16/2016 Elsevier Patient Education  [place]. Moderate Conscious Sedation, Adult, Care After These instructions provide you with information about caring for yourself after your procedure. Your health care provider may also give you more specific instructions. Your treatment has been planned according to current medical practices, but problems sometimes occur. Call your health care provider if you have any problems or questions after your procedure. What can I expect after the  procedure? After your procedure, it is common:  To feel sleepy for several hours.  To feel clumsy and have poor balance for several hours.  To have poor judgment for several hours.  To vomit if you eat too soon. Follow these instructions at home: For at least 24 hours after the procedure:   Do not: ? Participate in activities where you could fall or become injured. ? Drive. ? Use heavy machinery. ? Drink alcohol. ? Take sleeping pills or medicines that cause drowsiness. ? Make important decisions or sign legal documents. ? Take care of children on your own.  Rest. Eating and drinking  Follow the diet recommended by your health care provider.  If you vomit: ? Drink water, juice, or soup when you can drink without vomiting. ? Make sure you have little or no nausea before eating solid foods. General instructions  Have a responsible adult stay with you until you are awake and alert.  Take over-the-counter and prescription medicines only as told by your health care provider.  If  you smoke, do not smoke without supervision.  Keep all follow-up visits as told by your health care provider. This is important. Contact a health care provider if:  You keep feeling nauseous or you keep vomiting.  You feel light-headed.  You develop a rash.  You have a fever. Get help right away if:  You have trouble breathing. This information is not intended to replace advice given to you by your health care provider. Make sure you discuss any questions you have with your health care provider. Document Released: 05/15/2013 Document Revised: 07/07/2017 Document Reviewed: 11/14/2015 Elsevier Patient Education  2020 [employer].

## 2019-05-14 NOTE — H&P (Signed)
Chief Complaint: Patient was seen in consultation today for Bilateral femoral runoff with possible arterial angioplasty/artrectomy/stent placement at the request of  Dr. Dianah Field  Supervising Physician: Corrie Mckusick  Patient Status: [facility] - Out-pt  History of Present Illness: Michael Thomas is a 73 y.o. male   Known pt to IR Previous left lower extremity claudication 02/20/2018 with directional atherectomy and drug-eluting balloon angioplasty of critical stenosis of the SFA - 02/1618. Great result  Has developed Right lower extremity claudication symptoms:  Resting/post-exercise ABI and segmental exam:  This showed a right ABI drop of 0.93 to 0.39 after 1 minute of exercise, and a more mild drop on the left from 0.99 to 0.55.    Angiogram was then done 04/05/2019, which showed bilateral fem-pop disease, though did not precisely identify a right sided target lesion.  Today's duplex shows a velocity of ~190cm/sec in the distal right SFA in a region of shadowing, as the likely source.  Plan: - He would like proceed with treatment;  femoral artery angioplasty/atherectomy/stent placement unilateral   Scheduled today for same   Past Medical History:  Diagnosis Date   Benign prostatic hyperplasia with urinary obstruction 03/25/2015   Diabetes mellitus without complication ([place])    Hyperlipidemia    Kidney stone    OSA (obstructive sleep apnea)    Vitamin D deficiency     Past Surgical History:  Procedure Laterality Date   DG ARTHRO THUMB*L*     IR ANGIOGRAM EXTREMITY BILATERAL  02/20/2018   IR ANGIOGRAM EXTREMITY BILATERAL  04/05/2019   IR ANGIOGRAM SELECTIVE EACH ADDITIONAL VESSEL  02/20/2018   IR FEM POP ART ATHERECT INC PTA MOD SED  02/20/2018   IR RADIOLOGIST EVAL & MGMT  02/01/2018   IR RADIOLOGIST EVAL & MGMT  03/20/2018   IR RADIOLOGIST EVAL & MGMT  07/25/2018   IR RADIOLOGIST EVAL & MGMT  03/07/2019   IR RADIOLOGIST EVAL & MGMT  04/23/2019   IR  US GUIDE VASC ACCESS LEFT  04/05/2019   IR US GUIDE VASC ACCESS RIGHT  02/20/2018   NECK SURGERY     VASECTOMY      Allergies: Amoxicillin  Medications: Prior to Admission medications   Medication Sig Start Date End Date Taking? Authorizing Provider  aspirin EC 81 MG tablet Take 81 mg by mouth daily.    Yes [provider]  fenofibrate 160 MG tablet Take 1 tablet (160 mg total) by mouth daily. 07/26/18  Yes Silverio Decamp, MD  glipiZIDE (GLUCOTROL) 10 MG tablet TAKE 1 TABLET BY MOUTH TWICE A DAY BEFORE MEALS Patient taking differently: Take 10 mg by mouth 2 (two) times daily before a meal.  04/27/19  Yes Silverio Decamp, MD  lisinopril (PRINIVIL,ZESTRIL) 20 MG tablet Take 1 tablet (20 mg total) by mouth daily. 10/22/18  Yes Silverio Decamp, MD  metFORMIN (GLUCOPHAGE) 1000 MG tablet Take 1 tablet (1,000 mg total) by mouth 2 (two) times daily with a meal. 02/22/19  Yes Silverio Decamp, MD  omega-3 acid ethyl esters (LOVAZA) 1 g capsule Take 2 capsules (2 g total) by mouth 2 (two) times daily. Patient taking differently: Take 2 g by mouth daily.  07/26/18  Yes Silverio Decamp, MD  simvastatin (ZOCOR) 40 MG tablet Take 1 tablet (40 mg total) by mouth daily at 6 PM. 07/12/18  Yes Silverio Decamp, MD  sitaGLIPtin (JANUVIA) 50 MG tablet Take 1 tablet (50 mg total) by mouth daily. 07/25/18  Yes Aundria Mems  J, MD  tamsulosin (FLOMAX) 0.4 MG CAPS capsule TAKE 2 CAPSULES BY MOUTH EVERY DAY Patient taking differently: Take 0.4 mg by mouth 2 (two) times daily.  01/23/19  Yes Silverio Decamp, MD  Lancets Glory Rosebush ULTRASOFT) lancets Use as instructed 02/27/19   Silverio Decamp, MD     Family History  Problem Relation Age of Onset   Stroke Mother    Diabetes Mellitus II Mother    Prostate cancer Neg Hx    Kidney cancer Neg Hx    Bladder Cancer Neg Hx     Social History   Socioeconomic History   Marital status: Married     Spouse name: Not on file   Number of children: Not on file   Years of education: Not on file   Highest education level: Not on file  Occupational History   Not on file  Social Needs   Financial resource strain: Not on file   Food insecurity    Worry: Not on file    Inability: Not on file   Transportation needs    Medical: Not on file    Non-medical: Not on file  Tobacco Use   Smoking status: Never Smoker   Smokeless tobacco: Never Used  Substance and Sexual Activity   Alcohol use: No   Drug use: No   Sexual activity: Yes    Birth control/protection: None  Lifestyle   Physical activity    Days per week: Not on file    Minutes per session: Not on file   Stress: Not on file  Relationships   Social connections    Talks on phone: Not on file    Gets together: Not on file    Attends religious service: Not on file    Active member of club or organization: Not on file    Attends meetings of clubs or organizations: Not on file    Relationship status: Not on file  Other Topics Concern   Not on file  Social History Narrative   Not on file    Review of Systems: A 12 point ROS discussed and pertinent positives are indicated in the HPI above.  All other systems are negative.  Review of Systems  Constitutional: Negative for activity change, fatigue and fever.  Respiratory: Negative for cough and shortness of breath.   Cardiovascular: Negative for chest pain and leg swelling.  Neurological: Negative for weakness.  Psychiatric/Behavioral: Negative for behavioral problems and confusion.    Vital Signs: There were no vitals taken for this visit.  Physical Exam Vitals signs reviewed.  Constitutional:      Appearance: Normal appearance.  Cardiovascular:     Rate and Rhythm: Normal rate and regular rhythm.     Heart sounds: Normal heart sounds.  Pulmonary:     Effort: Pulmonary effort is normal.     Breath sounds: Normal breath sounds.  Abdominal:      Palpations: Abdomen is soft.  Musculoskeletal: Normal range of motion.  Skin:    General: Skin is warm and dry.  Neurological:     Mental Status: He is alert and oriented to person, place, and time.  Psychiatric:        Mood and Affect: Mood normal.        Behavior: Behavior normal.        Thought Content: Thought content normal.        Judgment: Judgment normal.     Imaging: US Arterial Lower Extremity Duplex Bilateral  Result Date:  04/23/2019 CLINICAL DATA:  73 year old male with peripheral arterial disease history of prior angioplasty on 02/20/2018 EXAM: BILATERAL LOWER EXTREMITY ARTERIAL DUPLEX SCAN TECHNIQUE: Gray-scale sonography as well as color Doppler and duplex ultrasound was performed to evaluate the arteries of both lower extremities including the common, superficial and profunda femoral arteries, popliteal artery and calf arteries. COMPARISON:  Prior lower extremity arterial study 03/07/2019 FINDINGS: Right Lower Extremity ABI: 0.9 Inflow: Normal common femoral arterial waveforms and velocities. No evidence of inflow (aortoiliac) disease. Outflow: Normal profunda femoral, superficial femoral and popliteal arterial waveforms and velocities. No focal elevation of the PSV to suggest stenosis. Runoff: The anterior and posterior tibial arteries are patent proximally. Left Lower Extremity ABI: 1.0 Inflow: Normal common femoral arterial waveforms and velocities. No evidence of inflow (aortoiliac) disease. Outflow: Normal profunda femoral, superficial femoral and popliteal arterial waveforms and velocities. No focal elevation of the PSV to suggest stenosis. Runoff: The anterior and posterior tibial arteries are patent proximally. IMPRESSION: 1. Normal resting left ankle-brachial index. 2. Mildly abnormal resting right ankle-brachial index 0.9 consistent with at least mild underlying peripheral arterial disease. 3. Scattered heterogeneous atherosclerotic plaque without evidence of high-grade  stenosis or occlusion in either lower extremity to the level of the knees. 4. Bilateral runoff disease. Signed, Criselda Peaches, MD, [facility] Electronically Signed   By: Jacqulynn Cadet M.D.   On: 04/23/2019 10:04   Ir Radiologist Eval & Mgmt  Result Date: 04/23/2019 Please refer to notes tab for details about interventional procedure. (Op Note)   Labs:  CBC: Recent Labs    07/25/18 0944 04/05/19 0735  WBC 6.8 5.6  HGB 14.8 13.0  HCT 43.7 39.7  PLT 373 312    COAGS: Recent Labs    04/05/19 0735  INR 1.0    BMP: Recent Labs    07/25/18 0944 10/22/18 1138 04/05/19 0735  NA 136 137 136  K 4.6 5.2 4.6  CL 104 104 107  CO2 23 24 20*  GLUCOSE 201* 161* 160*  BUN 22 27* 23  CALCIUM 9.3 10.1 9.2  CREATININE 1.28* 1.41* 1.24  GFRNONAA  --   --  57*  GFRAA  --   --  >60    LIVER FUNCTION TESTS: Recent Labs    07/25/18 0944 10/22/18 1138  BILITOT 0.3 0.3  AST 22 23  ALT 31 28  PROT 6.9 7.3    TUMOR MARKERS: No results for input(s): AFPTM, CEA, CA199, CHROMGRNA in the last 8760 hours.  Assessment and Plan:  Lower extremity claudication Previous treatment on left 02/2018 New symptoms on right Evaluation showing signs of right sided disease Scheduled now for Bilateral femoral runoff with possible arterial angioplasty/arthrectomy/stent placement Risks and benefits of   Bilateral femoral runoff with possible arterial angioplasty/arthrectomy/stent placement were discussed with the patient including, but not limited to bleeding, infection, vascular injury or contrast induced renal failure.  This interventional procedure involves the use of X-rays and because of the nature of the planned procedure, it is possible that we will have prolonged use of X-ray fluoroscopy.  Potential radiation risks to you include (but are not limited to) the following: - A slightly elevated risk for cancer  several  years later in life. This risk is typically less than 0.5% percent. This risk is low in comparison to the normal incidence of human cancer, which is 33% for women and 50% for men according to the [place]. - Radiation induced injury can  include skin redness, resembling a rash, tissue breakdown / ulcers and hair loss (which can be temporary or permanent).   The likelihood of either of these occurring depends on the difficulty of the procedure and whether you are sensitive to radiation due to previous procedures, disease, or genetic conditions.   IF your procedure requires a prolonged use of radiation, you will be notified and given written instructions for further action.  It is your responsibility to monitor the irradiated area for the 2 weeks following the procedure and to notify your physician if you are concerned that you have suffered a radiation induced injury.    All of the patient's questions were answered, patient is agreeable to proceed.  Consent signed and in chart.  Thank you for this interesting consult.  I greatly enjoyed meeting Bethany Claude Thorup and look forward to participating in their care.  A copy of this report was sent to the requesting provider on this date.  Electronically Signed: Lavonia Drafts, PA-C 05/14/2019, 7:33 AM   I spent a total of    25 Minutes in face to face in clinical consultation, greater than 50% of which was counseling/coordinating care for femoral runoff with possible intervention

## 2019-05-14 NOTE — Procedures (Addendum)
Interventional Radiology Procedure Note  Procedure: US guided right CFA access.  Angiogram and treatment of SFA lesion, with directional atherectomy/distal protection, and drug eluting balloon, 6x80.  Angioseal deployed for hemostasis.   Complications: None  Recommendations:  - Right hip straight x 4 hours. - Routine wound care.  Do not submerge x 7 days - Continue ASA 81 daily.  - Start Plavix 75mg  daily - Hold metformin 48 hours - Routine care - advance diet - Follow up with Dr. Earleen Newport in 4-6 weeks with repeat ABI/segmental   Signed,  Dulcy Fanny. Earleen Newport, DO

## 2019-05-14 NOTE — Progress Notes (Signed)
Discharge instructions reviewed with pt and his wife (via telephone) both voice understanding.  

## 2019-05-16 ENCOUNTER — Ambulatory Visit: Payer: PPO | Admitting: Sports Medicine

## 2019-05-20 ENCOUNTER — Other Ambulatory Visit: Payer: Self-pay

## 2019-05-20 ENCOUNTER — Encounter: Payer: Self-pay | Admitting: Sports Medicine

## 2019-05-20 ENCOUNTER — Ambulatory Visit (INDEPENDENT_AMBULATORY_CARE_PROVIDER_SITE_OTHER): Payer: PPO | Admitting: Sports Medicine

## 2019-05-20 DIAGNOSIS — M65311 Trigger thumb, right thumb: Secondary | ICD-10-CM

## 2019-05-20 NOTE — Progress Notes (Signed)
Subjective:    CC: Follow-up  HPI: Right trigger thumb: Symptoms completely resolved after right flexor pollicis longus tendon sheath injection at the last visit.  I reviewed the past medical history, family history, social history, surgical history, and allergies today and no changes were needed.  Please see the problem list section below in epic for further details.  Past Medical History: Past Medical History:  Diagnosis Date  . Benign prostatic hyperplasia with urinary obstruction 03/25/2015  . Diabetes mellitus without complication ([place])   . Hyperlipidemia   . Kidney stone   . OSA (obstructive sleep apnea)   . Vitamin D deficiency    Past Surgical History: Past Surgical History:  Procedure Laterality Date  . DG ARTHRO THUMB*L*    . IR ANGIOGRAM EXTREMITY BILATERAL  02/20/2018  . IR ANGIOGRAM EXTREMITY BILATERAL  04/05/2019  . IR ANGIOGRAM SELECTIVE EACH ADDITIONAL VESSEL  02/20/2018  . IR FEM POP ART ATHERECT INC PTA MOD SED  02/20/2018  . IR FEM POP ART ATHERECT INC PTA MOD SED  05/14/2019  . IR RADIOLOGIST EVAL & MGMT  02/01/2018  . IR RADIOLOGIST EVAL & MGMT  03/20/2018  . IR RADIOLOGIST EVAL & MGMT  07/25/2018  . IR RADIOLOGIST EVAL & MGMT  03/07/2019  . IR RADIOLOGIST EVAL & MGMT  04/23/2019  . IR US GUIDE VASC ACCESS LEFT  04/05/2019  . IR US GUIDE VASC ACCESS RIGHT  02/20/2018  . IR US GUIDE VASC ACCESS RIGHT  05/14/2019  . NECK SURGERY    . VASECTOMY     Social History: Social History   Socioeconomic History  . Marital status: Married    Spouse name: Not on file  . Number of children: Not on file  . Years of education: Not on file  . Highest education level: Not on file  Occupational History  . Not on file  Social Needs  . Financial resource strain: Not on file  . Food insecurity    Worry: Not on file    Inability: Not on file  . Transportation needs    Medical: Not on file    Non-medical: Not on file  Tobacco Use  . Smoking status: Never Smoker  .  Smokeless tobacco: Never Used  Substance and Sexual Activity  . Alcohol use: No  . Drug use: No  . Sexual activity: Yes    Birth control/protection: None  Lifestyle  . Physical activity    Days per week: Not on file    Minutes per session: Not on file  . Stress: Not on file  Relationships  . Social Herbalist on phone: Not on file    Gets together: Not on file    Attends religious service: Not on file    Active member of club or organization: Not on file    Attends meetings of clubs or organizations: Not on file    Relationship status: Not on file  Other Topics Concern  . Not on file  Social History Narrative  . Not on file   Family History: Family History  Problem Relation Age of Onset  . Stroke Mother   . Diabetes Mellitus II Mother   . Prostate cancer Neg Hx   . Kidney cancer Neg Hx   . Bladder Cancer Neg Hx    Allergies: Allergies  Allergen Reactions  . Amoxicillin Other (See Comments)    Fatigue and Myalgia and Rigors x2 attempts   Medications: See med rec.  Review of Systems:  No fevers, chills, night sweats, weight loss, chest pain, or shortness of breath.   Objective:    General: Well Developed, well nourished, and in no acute distress.  Neuro: Alert and oriented x3, extra-ocular muscles intact, sensation grossly intact.  HEENT: Normocephalic, atraumatic, pupils equal round reactive to light, neck supple, no masses, no lymphadenopathy, thyroid nonpalpable.  Skin: Warm and dry, no rashes. Cardiac: Regular rate and rhythm, no murmurs rubs or gallops, no lower extremity edema.  Respiratory: Clear to auscultation bilaterally. Not using accessory muscles, speaking in full sentences.  Impression and Recommendations:    Trigger thumb, right thumb Resolved after trigger thumb injection. Return as needed.   ___________________________________________ Gwen Her. Dianah Field, M.D., ABFM., CAQSM. Primary Care and Sports Medicine [facility]  [facility]  Adjunct Professor of [employer] of Medicine

## 2019-05-20 NOTE — Assessment & Plan Note (Signed)
Resolved after trigger thumb injection. Return as needed.

## 2019-05-24 ENCOUNTER — Other Ambulatory Visit: Payer: Self-pay | Admitting: Interventional Radiology

## 2019-05-24 DIAGNOSIS — I739 Peripheral vascular disease, unspecified: Secondary | ICD-10-CM

## 2019-06-06 IMAGING — CT CT HEAD W/O CM
3 of 4 series · 14 of 47 positions shown, 16 images · non-contrast
Comparison: None.

CLINICAL DATA: 71-year-old male with short and long-term memory
loss for the past several months. No known injury. No history of
cancer. Initial encounter.

EXAM:
CT HEAD WITHOUT CONTRAST
TECHNIQUE: Contiguous axial images were obtained from the base of the skull
through the vertex without intravenous contrast.

[Series 2: head 5.00 hr40 s3 ax · axial · 0.32mm/px · z∈[-604,-498]mm · 8 of 26 slices shown, 10 images]
[im 2/26  brain]
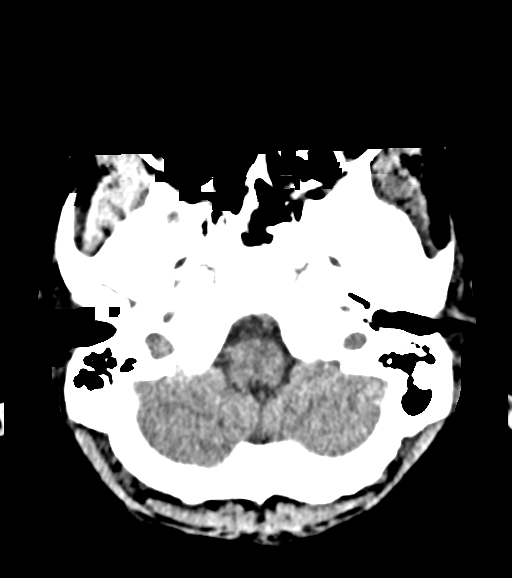
[im 2/26  bone]
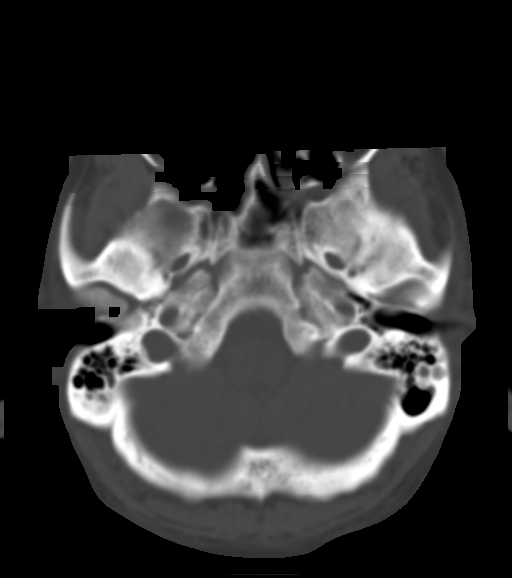
[im 6/26  brain]
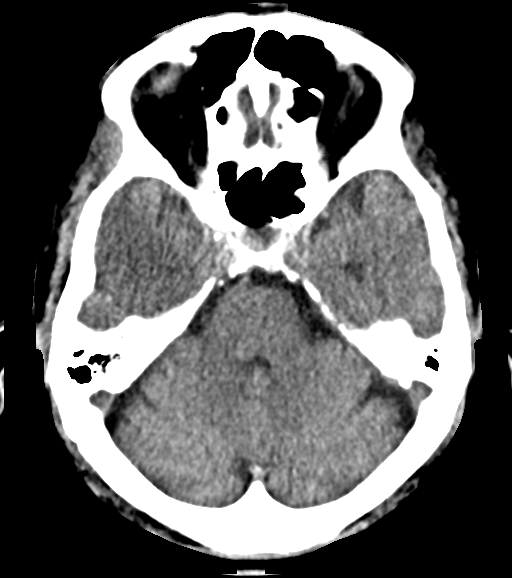
[im 9/26  brain]
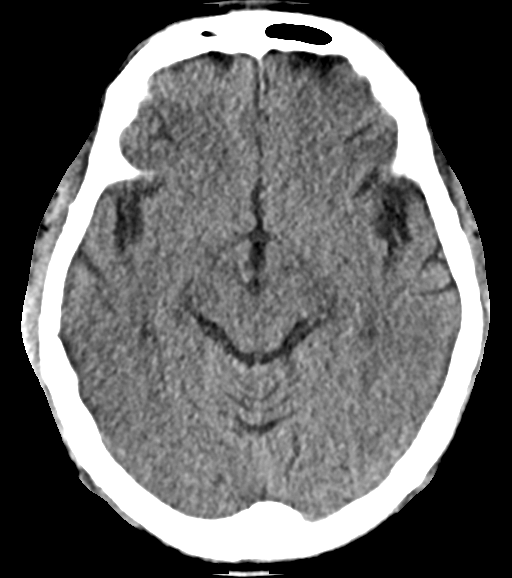
[im 11/26  brain]
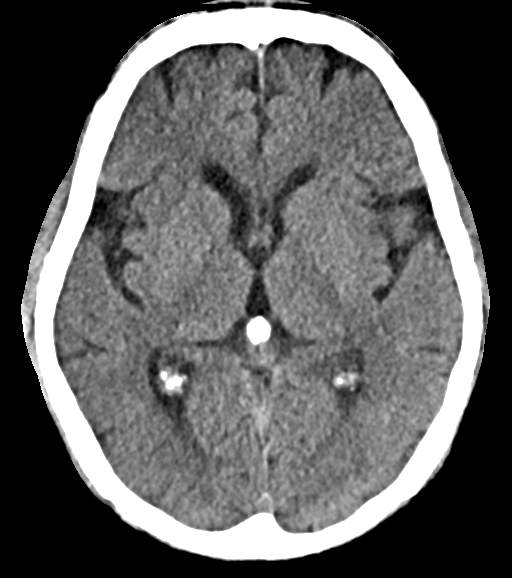
[im 15/26  brain]
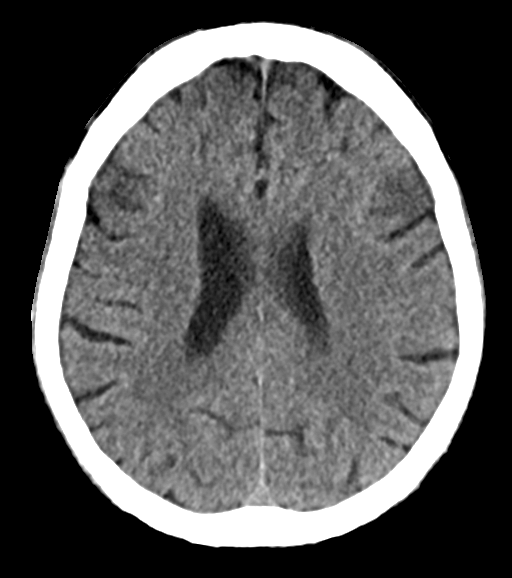
[im 15/26  bone]
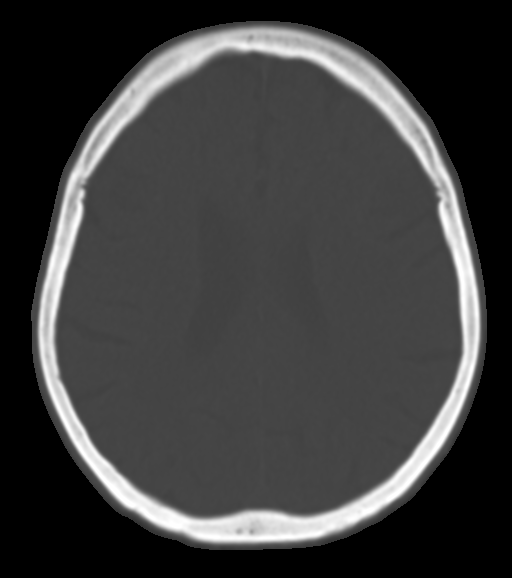
[im 17/26  brain]
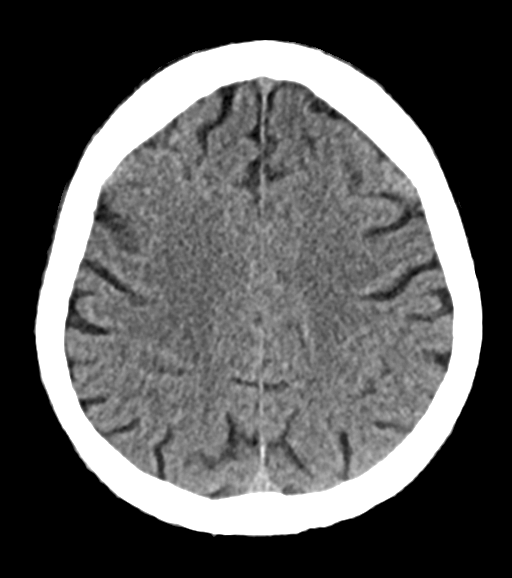
[im 20/26  brain]
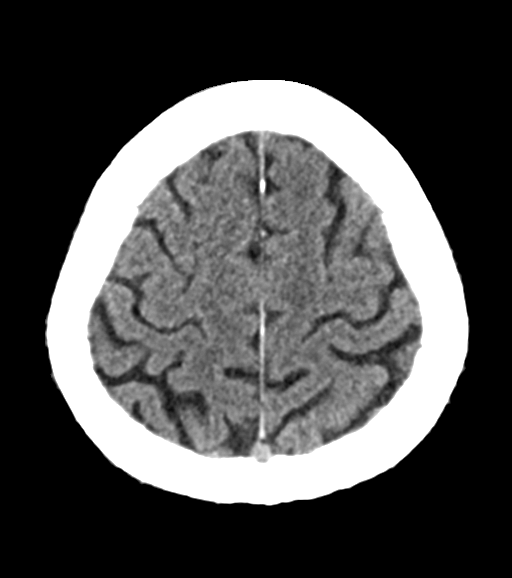
[im 24/26  brain]
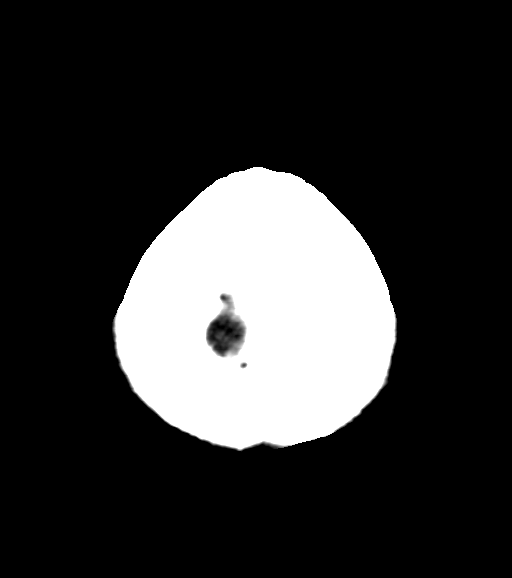

[Series 6: head 3.00 hr40 s3 cor · coronal · 0.25mm/px · 3 of 61 slices shown]
[im 21/61  brain]
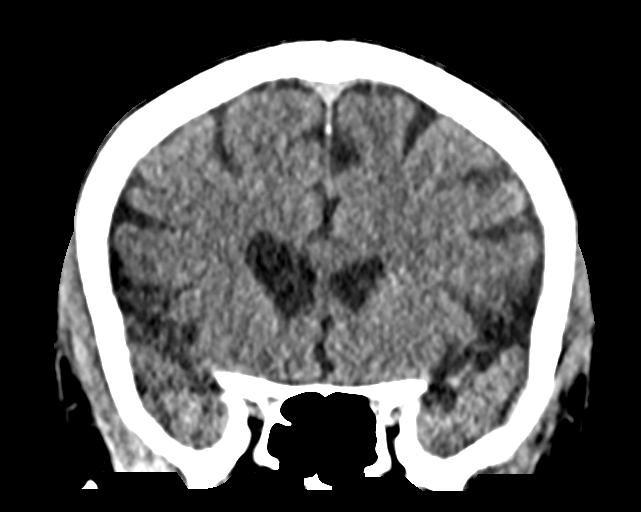
[im 27/61  brain]
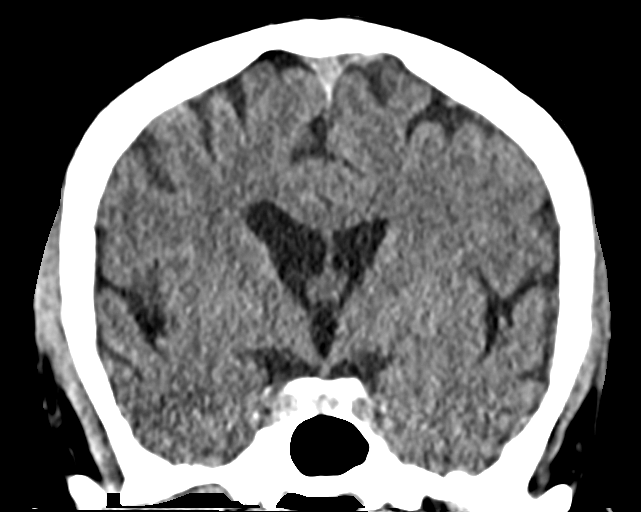
[im 34/61  brain]
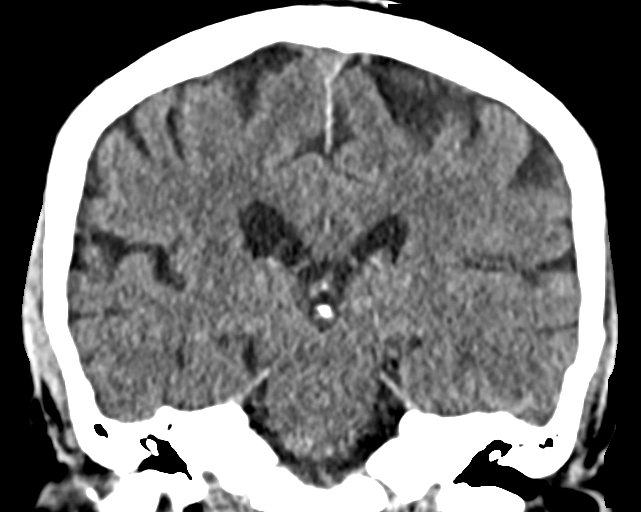

[Series 8: head 3.00 hr40 s3 sag · sagittal · 0.26mm/px · 3 of 54 slices shown]
[im 18/54  brain]
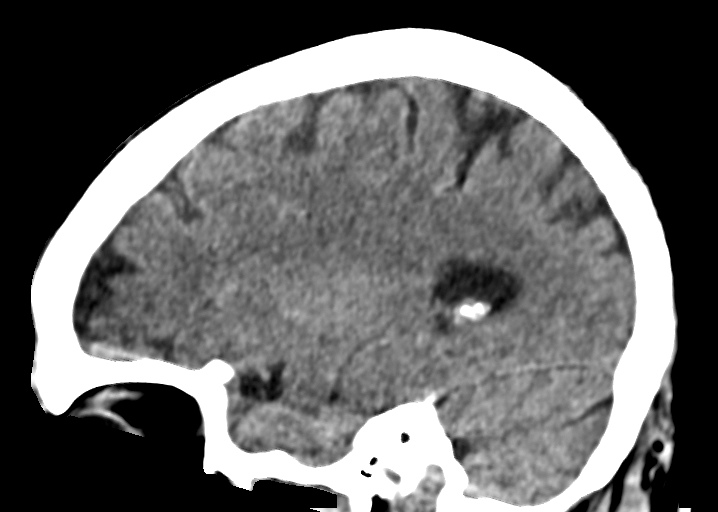
[im 27/54  brain]
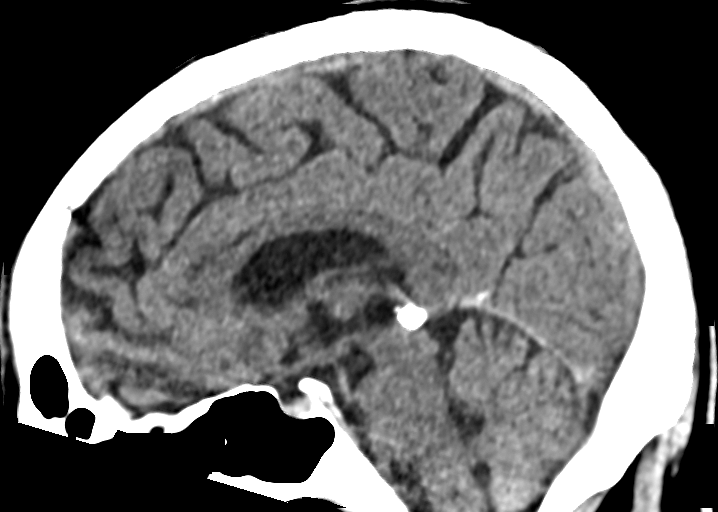
[im 36/54  brain]
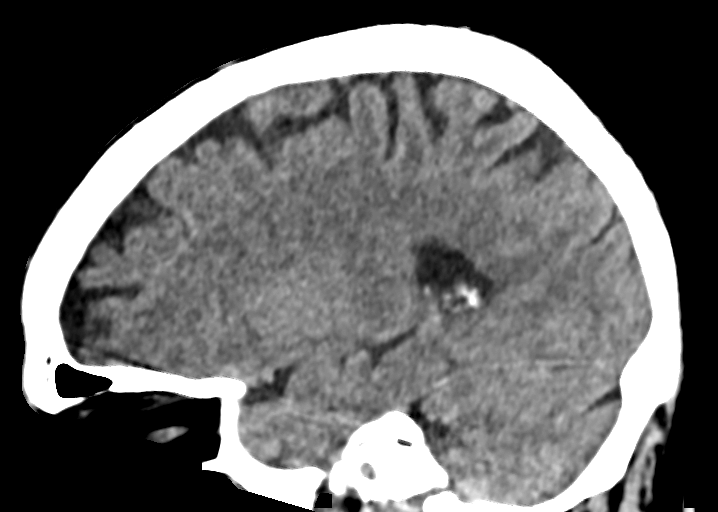

[14 of 47 positions shown; findings below may reference images not displayed]

FINDINGS: Brain: No intracranial hemorrhage or CT evidence of large acute
infarct.

Mild global atrophy.

No intracranial mass lesion noted on this unenhanced exam.

Vascular: Vascular calcifications

Skull: Negative

Sinuses/Orbits: Visualized orbits and paranasal sinuses
unremarkable.

Other: Visualized mastoid air cells and middle ear cavities are
clear.
IMPRESSION: Mild global atrophy.  Otherwise negative unenhanced head CT.

## 2019-06-18 ENCOUNTER — Ambulatory Visit
Admission: RE | Admit: 2019-06-18 | Discharge: 2019-06-18 | Disposition: A | Discharge status: Home / Self Care | Payer: PPO | Source: Ambulatory Visit | Attending: Interventional Radiology | Admitting: Interventional Radiology

## 2019-06-18 ENCOUNTER — Encounter: Payer: Self-pay | Admitting: *Deleted

## 2019-06-18 DIAGNOSIS — C4490 Unspecified malignant neoplasm of skin, unspecified: Secondary | ICD-10-CM | POA: Diagnosis not present

## 2019-06-18 DIAGNOSIS — I739 Peripheral vascular disease, unspecified: Secondary | ICD-10-CM

## 2019-06-18 HISTORY — PX: IR RADIOLOGIST EVAL & MGMT: IMG5224

## 2019-06-18 NOTE — Progress Notes (Signed)
Chief Complaint: PAD  Referring Physician(s): Dr. Dianah Field  History of Present Illness: Michael Thomas is a 73 y.o. male presenting today for a scheduled follow up of known PAD, SP treatment of Rutherford 3 RLE symptomatic lesion 05/14/2019.  He is also SP treatment of left SFA for critical stenosis July of 2019.    He is here today for his follow up appointment, and repeat non-invasive imaging.  He is here by himself.   He was recently treated 05/14/2019 with R CFA access, angiogram, directional atherectomy and drug-eluting balloon angioplasty.   He tells me that he has completely recovered, and though he feels ready to pickup his physical activity, he has not yet.  He wanted to have his follow up first.  He has no complaints/concerns.  He continues on daily ASA.   He would like to start back walking/exercies, given he enjoys going on long walks with his wife, both on the street in the neighborhood as well as on hiking trails, and also continues to enjoy running.    He has a new non-invasive exam performed today with normal Resting ABI: 1.02 on right, 1.01 on left.  Segmental exam is normal.  Previous Right ABI of 0.93 falling to 0.39, which is now normal range.  He continues maximal medical therapy, and feels he has recovered completely.   Past Medical History:  Diagnosis Date  . Benign prostatic hyperplasia with urinary obstruction 03/25/2015  . Diabetes mellitus without complication ([place])   . Hyperlipidemia   . Kidney stone   . OSA (obstructive sleep apnea)   . Vitamin D deficiency     Past Surgical History:  Procedure Laterality Date  . DG ARTHRO THUMB*L*    . IR ANGIOGRAM EXTREMITY BILATERAL  02/20/2018  . IR ANGIOGRAM EXTREMITY BILATERAL  04/05/2019  . IR ANGIOGRAM SELECTIVE EACH ADDITIONAL VESSEL  02/20/2018  . IR FEM POP ART ATHERECT INC PTA MOD SED  02/20/2018  . IR FEM POP ART ATHERECT INC PTA MOD SED  05/14/2019  . IR RADIOLOGIST EVAL & MGMT   02/01/2018  . IR RADIOLOGIST EVAL & MGMT  03/20/2018  . IR RADIOLOGIST EVAL & MGMT  07/25/2018  . IR RADIOLOGIST EVAL & MGMT  03/07/2019  . IR RADIOLOGIST EVAL & MGMT  04/23/2019  . IR RADIOLOGIST EVAL & MGMT  06/18/2019  . IR US GUIDE VASC ACCESS LEFT  04/05/2019  . IR US GUIDE VASC ACCESS RIGHT  02/20/2018  . IR US GUIDE VASC ACCESS RIGHT  05/14/2019  . NECK SURGERY    . VASECTOMY      Allergies: Amoxicillin  Medications: Prior to Admission medications   Medication Sig Start Date End Date Taking? Authorizing Provider  aspirin EC 81 MG tablet Take 81 mg by mouth daily.     [provider]  fenofibrate 160 MG tablet Take 1 tablet (160 mg total) by mouth daily. 07/26/18   Silverio Decamp, MD  glipiZIDE (GLUCOTROL) 10 MG tablet TAKE 1 TABLET BY MOUTH TWICE A DAY BEFORE MEALS Patient taking differently: Take 10 mg by mouth 2 (two) times daily before a meal.  04/27/19   Silverio Decamp, MD  Lancets [facility] ULTRASOFT) lancets Use as instructed 02/27/19   Silverio Decamp, MD  lisinopril (PRINIVIL,ZESTRIL) 20 MG tablet Take 1 tablet (20 mg total) by mouth daily. 10/22/18   Silverio Decamp, MD  metFORMIN (GLUCOPHAGE) 1000 MG tablet Take 1 tablet (1,000 mg total) by mouth 2 (two) times daily with  a meal. 02/22/19   Silverio Decamp, MD  omega-3 acid ethyl esters (LOVAZA) 1 g capsule Take 2 capsules (2 g total) by mouth 2 (two) times daily. Patient taking differently: Take 2 g by mouth daily.  07/26/18   Silverio Decamp, MD  simvastatin (ZOCOR) 40 MG tablet Take 1 tablet (40 mg total) by mouth daily at 6 PM. 07/12/18   Silverio Decamp, MD  sitaGLIPtin (JANUVIA) 50 MG tablet Take 1 tablet (50 mg total) by mouth daily. 07/25/18   Silverio Decamp, MD  tamsulosin (FLOMAX) 0.4 MG CAPS capsule TAKE 2 CAPSULES BY MOUTH EVERY DAY Patient taking differently: Take 0.4 mg by mouth 2 (two) times daily.  01/23/19   Silverio Decamp, MD     Family  History  Problem Relation Age of Onset  . Stroke Mother   . Diabetes Mellitus II Mother   . Prostate cancer Neg Hx   . Kidney cancer Neg Hx   . Bladder Cancer Neg Hx     Social History   Socioeconomic History  . Marital status: Married    Spouse name: Not on file  . Number of children: Not on file  . Years of education: Not on file  . Highest education level: Not on file  Occupational History  . Not on file  Social Needs  . Financial resource strain: Not on file  . Food insecurity    Worry: Not on file    Inability: Not on file  . Transportation needs    Medical: Not on file    Non-medical: Not on file  Tobacco Use  . Smoking status: Never Smoker  . Smokeless tobacco: Never Used  Substance and Sexual Activity  . Alcohol use: No  . Drug use: No  . Sexual activity: Yes    Birth control/protection: None  Lifestyle  . Physical activity    Days per week: Not on file    Minutes per session: Not on file  . Stress: Not on file  Relationships  . Social Herbalist on phone: Not on file    Gets together: Not on file    Attends religious service: Not on file    Active member of club or organization: Not on file    Attends meetings of clubs or organizations: Not on file    Relationship status: Not on file  Other Topics Concern  . Not on file  Social History Narrative  . Not on file       Review of Systems: A 12 point ROS discussed and pertinent positives are indicated in the HPI above.  All other systems are negative.  Review of Systems  Vital Signs: There were no vitals taken for this visit.  Physical Exam Targeted exam of the lower extremity, no wounds, swelling, or pallor.  Warm extremity with doppler + signal at the right PT and DP.  Palpable PT and DP.   Imaging: Ir Radiologist Eval & Mgmt  Result Date: 06/18/2019 Please refer to notes tab for details about interventional procedure. (Op Note)   Labs:  CBC: Recent Labs    07/25/18 0944  04/05/19 0735 05/14/19 0740  WBC 6.8 5.6 6.3  HGB 14.8 13.0 15.4  HCT 43.7 39.7 46.2  PLT 373 312 328    COAGS: Recent Labs    04/05/19 0735 05/14/19 0740  INR 1.0 1.1    BMP: Recent Labs    07/25/18 0944 10/22/18 1138 04/05/19 0735 05/14/19 0740  NA 136 137 136 138  K 4.6 5.2 4.6 4.6  CL 104 104 107 103  CO2 23 24 20* 25  GLUCOSE 201* 161* 160* 172*  BUN 22 27* 23 24*  CALCIUM 9.3 10.1 9.2 9.9  CREATININE 1.28* 1.41* 1.24 1.42*  GFRNONAA  --   --  57* 48*  GFRAA  --   --  >60 56*    LIVER FUNCTION TESTS: Recent Labs    07/25/18 0944 10/22/18 1138  BILITOT 0.3 0.3  AST 22 23  ALT 31 28  PROT 6.9 7.3    TUMOR MARKERS: No results for input(s): AFPTM, CEA, CA199, CHROMGRNA in the last 8760 hours.  Assessment and Plan:  Assessment:  Mr Michael Thomas is a 73yo male SP treatment of PAD, most recently with directional atherectomy/DEB of right SFA lesion for life-style limiting Rutherford 3 symptoms.    He has fully recovered and is feeling well. We discussed returning to his exercise routine.   Regarding medical management, maximal medical therapy for reduction of risk factors is indicated as recommended by updated AHA guidelines1.  This includes anti-platelet medication, tight blood glucose control to a HbA1c < 7, tight blood pressure control, maximum-dose HMG-CoA reductase inhibitor, and smoking cessation.    Annual ABI screening is indicated given his history, risk factors, age, and the multi-disciplinary guidelines.   Annual flu vaccination is also recommended, with Class 1 recommendation1.   Plan: - Will follow up in 12 months with repeat non-invasive exam and office visit. Should he have any problems in the interval he knows he can contact us.  - He may return to excercise - Continue maximal medical therapy for cardiovascular risk reduction, including anti-platelet therapy. -Annual flu vaccination is recommended in the setting of known PAD, in the absence of  contra-indications.    ___________________________________________________________________   1Morley Kos MD, et al. 2016 AHA/ACC Guideline on the Management of Patients With Lower Extremity Peripheral Artery Disease: Executive Summary: A Report of the American College of Cardiology/American Heart Association Task Force on Clinical Practice Guidelines. J Am Coll Cardiol. 2017 Mar 21;69(11):1465-1508. doi: 10.1016/j.jacc.2016.11.008.   2 - Norgren L, et al. TASC II Working Group. Inter-society consensus for the management of peripheral arterial disease. Int Tressia Miners. 2007 Jun;26(2):81-157. Review. PubMed PMID: 23536144  3 - Hingorani A, et al. The management of diabetic foot: A clinical practice guideline by the Society for Vascular Surgery in collaboration with the [place] and the Society  for Vascular Medicine. J Vasc Surg. 2016 Feb;63(2 Suppl):3S-21S. doi: 10.1016/j.jvs.2015.10.003. PubMed PMID: 31540086.    Electronically Signed: Corrie Mckusick 06/18/2019, 11:51 AM   I spent a total of    25 Minutes in face to face in clinical consultation, greater than 50% of which was counseling/coordinating care for lower extremity PAD, SP treatment of right SFA with directional atherectomy and DEB.

## 2019-06-20 ENCOUNTER — Other Ambulatory Visit: Payer: Self-pay | Admitting: *Deleted

## 2019-06-20 DIAGNOSIS — N138 Other obstructive and reflux uropathy: Secondary | ICD-10-CM

## 2019-06-20 MED ORDER — TAMSULOSIN HCL 0.4 MG PO CAPS: 0.4000 mg | ORAL_CAPSULE | Freq: Two times a day (BID) | ORAL | 1 refills | Status: DC

## 2019-07-22 ENCOUNTER — Other Ambulatory Visit: Payer: Self-pay | Admitting: Sports Medicine

## 2019-07-22 MED ORDER — SIMVASTATIN 40 MG PO TABS: 40.0000 mg | ORAL_TABLET | Freq: Every day | ORAL | 3 refills | Status: DC

## 2019-07-23 ENCOUNTER — Other Ambulatory Visit: Payer: Self-pay | Admitting: Sports Medicine

## 2019-07-23 DIAGNOSIS — E785 Hyperlipidemia, unspecified: Secondary | ICD-10-CM

## 2019-07-24 ENCOUNTER — Ambulatory Visit: Payer: PPO | Admitting: Sports Medicine

## 2019-07-26 ENCOUNTER — Encounter: Payer: Self-pay | Admitting: Sports Medicine

## 2019-07-26 ENCOUNTER — Ambulatory Visit (INDEPENDENT_AMBULATORY_CARE_PROVIDER_SITE_OTHER): Payer: PPO | Admitting: Sports Medicine

## 2019-07-26 ENCOUNTER — Other Ambulatory Visit: Payer: Self-pay

## 2019-07-26 VITALS — BP 123/69 | HR 71 | Ht 62.0 in | Wt 137.0 lb

## 2019-07-26 DIAGNOSIS — E1169 Type 2 diabetes mellitus with other specified complication: Secondary | ICD-10-CM

## 2019-07-26 DIAGNOSIS — E118 Type 2 diabetes mellitus with unspecified complications: Secondary | ICD-10-CM | POA: Diagnosis not present

## 2019-07-26 DIAGNOSIS — R350 Frequency of micturition: Secondary | ICD-10-CM | POA: Diagnosis not present

## 2019-07-26 LAB — POCT GLYCOSYLATED HEMOGLOBIN (HGB A1C): Hemoglobin A1C: 7 % — AB (ref 4.0–5.6)

## 2019-07-26 NOTE — Progress Notes (Addendum)
Subjective:    CC: Follow-up  HPI: Michael Thomas returns, his diabetes has been under appropriate control, unfortunately he is noting a few episodes of hypoglycemia into the 60s with symptoms.  In addition he is noting random urges to urinate, no fevers, chills, dysuria.  No hesitancy, no dribbling, no weak stream.  Symptoms are completely random.  I reviewed the past medical history, family history, social history, surgical history, and allergies today and no changes were needed.  Please see the problem list section below in epic for further details.  Past Medical History: Past Medical History:  Diagnosis Date  . Benign prostatic hyperplasia with urinary obstruction 03/25/2015  . Diabetes mellitus without complication ([place])   . Hyperlipidemia   . Kidney stone   . OSA (obstructive sleep apnea)   . Vitamin D deficiency    Past Surgical History: Past Surgical History:  Procedure Laterality Date  . DG ARTHRO THUMB*L*    . IR ANGIOGRAM EXTREMITY BILATERAL  02/20/2018  . IR ANGIOGRAM EXTREMITY BILATERAL  04/05/2019  . IR ANGIOGRAM SELECTIVE EACH ADDITIONAL VESSEL  02/20/2018  . IR FEM POP ART ATHERECT INC PTA MOD SED  02/20/2018  . IR FEM POP ART ATHERECT INC PTA MOD SED  05/14/2019  . IR RADIOLOGIST EVAL & MGMT  02/01/2018  . IR RADIOLOGIST EVAL & MGMT  03/20/2018  . IR RADIOLOGIST EVAL & MGMT  07/25/2018  . IR RADIOLOGIST EVAL & MGMT  03/07/2019  . IR RADIOLOGIST EVAL & MGMT  04/23/2019  . IR RADIOLOGIST EVAL & MGMT  06/18/2019  . IR US GUIDE VASC ACCESS LEFT  04/05/2019  . IR US GUIDE VASC ACCESS RIGHT  02/20/2018  . IR US GUIDE VASC ACCESS RIGHT  05/14/2019  . NECK SURGERY    . VASECTOMY     Social History: Social History   Socioeconomic History  . Marital status: Married    Spouse name: Not on file  . Number of children: Not on file  . Years of education: Not on file  . Highest education level: Not on file  Occupational History  . Not on file  Tobacco Use  . Smoking status: Never  Smoker  . Smokeless tobacco: Never Used  Substance and Sexual Activity  . Alcohol use: No  . Drug use: No  . Sexual activity: Yes    Birth control/protection: None  Other Topics Concern  . Not on file  Social History Narrative  . Not on file   Social Determinants of Health   Financial Resource Strain:   . Difficulty of Paying Living Expenses: Not on file  Food Insecurity:   . Worried About Charity fundraiser in the Last Year: Not on file  . Ran Out of Food in the Last Year: Not on file  Transportation Needs:   . Lack of Transportation (Medical): Not on file  . Lack of Transportation (Non-Medical): Not on file  Physical Activity:   . Days of Exercise per Week: Not on file  . Minutes of Exercise per Session: Not on file  Stress:   . Feeling of Stress : Not on file  Social Connections:   . Frequency of Communication with Friends and Family: Not on file  . Frequency of Social Gatherings with Friends and Family: Not on file  . Attends Religious Services: Not on file  . Active Member of Clubs or Organizations: Not on file  . Attends Archivist Meetings: Not on file  . Marital Status: Not on file  Family History: Family History  Problem Relation Age of Onset  . Stroke Mother   . Diabetes Mellitus II Mother   . Prostate cancer Neg Hx   . Kidney cancer Neg Hx   . Bladder Cancer Neg Hx    Allergies: Allergies  Allergen Reactions  . Amoxicillin Other (See Comments)    Fatigue and Myalgia and Rigors x2 attempts   Medications: See med rec.  Review of Systems: No fevers, chills, night sweats, weight loss, chest pain, or shortness of breath.   Objective:    General: Well Developed, well nourished, and in no acute distress.  Neuro: Alert and oriented x3, extra-ocular muscles intact, sensation grossly intact.  HEENT: Normocephalic, atraumatic, pupils equal round reactive to light, neck supple, no masses, no lymphadenopathy, thyroid nonpalpable.  Skin: Warm and  dry, no rashes. Cardiac: Regular rate and rhythm, no murmurs rubs or gallops, no lower extremity edema.  Respiratory: Clear to auscultation bilaterally. Not using accessory muscles, speaking in full sentences.  Impression and Recommendations:    Diabetes mellitus, type 2 (HCC) A1c is down to 7%. He is having some hypoglycemic episodes. He will try to find a pill cutter and do 10 mg in the morning and 5 mg in the evening. If unable to cut the pill we can simply switch to 5 mg twice daily again.  Urinary frequency Symptoms are suggestive of overactive bladder versus prostatitis. Urinalysis, urine culture in the lab, if clean then we will start an anticholinergic such as Vesicare, Detrol, oxybutynin. If he gets adverse effects we can use Myrbetriq instead.  Urinalysis is clear, adding Myrbetriq low-dose to start out with.  Return in 1 month.   ___________________________________________ Gwen Her. Dianah Field, M.D., ABFM., CAQSM. Primary Care and Sports Medicine [facility]  Adjunct Professor of [employer] [facility] [employer]

## 2019-07-26 NOTE — Assessment & Plan Note (Addendum)
Symptoms are suggestive of overactive bladder versus prostatitis. Urinalysis, urine culture in the lab, if clean then we will start an anticholinergic such as Vesicare, Detrol, oxybutynin. If he gets adverse effects we can use Myrbetriq instead.  Urinalysis is clear, adding Myrbetriq low-dose to start out with.  Return in 1 month.

## 2019-07-26 NOTE — Assessment & Plan Note (Signed)
A1c is down to 7%. He is having some hypoglycemic episodes. He will try to find a pill cutter and do 10 mg in the morning and 5 mg in the evening. If unable to cut the pill we can simply switch to 5 mg twice daily again.

## 2019-07-27 LAB — URINALYSIS W MICROSCOPIC + REFLEX CULTURE
Bacteria, UA: NONE SEEN /HPF
Bilirubin Urine: NEGATIVE
Glucose, UA: NEGATIVE
Hgb urine dipstick: NEGATIVE
Hyaline Cast: NONE SEEN /LPF
Ketones, ur: NEGATIVE
Leukocyte Esterase: NEGATIVE
Nitrites, Initial: NEGATIVE
RBC / HPF: NONE SEEN /HPF (ref 0–2)
Specific Gravity, Urine: 1.017 (ref 1.001–1.03)
Squamous Epithelial / HPF: NONE SEEN /HPF (ref <=5)
WBC, UA: NONE SEEN /HPF (ref 0–5)
pH: <=5 (ref 5.0–8.0)

## 2019-07-27 LAB — NO CULTURE INDICATED

## 2019-07-27 MED ORDER — MIRABEGRON ER 25 MG PO TB24: 25.0000 mg | ORAL_TABLET | Freq: Every day | ORAL | 3 refills | Status: DC

## 2019-07-27 NOTE — Addendum Note (Signed)
Addended by: Silverio Decamp on: 07/27/2019 11:17 AM   Modules accepted: Orders

## 2019-07-31 ENCOUNTER — Telehealth: Payer: Self-pay

## 2019-07-31 DIAGNOSIS — R350 Frequency of micturition: Secondary | ICD-10-CM

## 2019-07-31 MED ORDER — OXYBUTYNIN CHLORIDE ER 5 MG PO TB24: 5.0000 mg | ORAL_TABLET | Freq: Every day | ORAL | 3 refills | Status: DC

## 2019-07-31 NOTE — Telephone Encounter (Signed)
Switching to oxybutynin

## 2019-07-31 NOTE — Telephone Encounter (Signed)
Michael Thomas called and states the Myrbetriq cost over $400. He would like a different medication that has a generic.

## 2019-08-05 NOTE — Telephone Encounter (Signed)
Patient was advised and did not have any questions.

## 2019-08-12 ENCOUNTER — Other Ambulatory Visit: Payer: Self-pay | Admitting: Sports Medicine

## 2019-08-12 DIAGNOSIS — E1169 Type 2 diabetes mellitus with other specified complication: Secondary | ICD-10-CM

## 2019-08-17 IMAGING — MR MR KNEE*L* W/O CM
6 series · 37 of 40 positions shown · non-contrast
Comparison: None.

CLINICAL DATA: Knee pain since July 2017.

EXAM:
MRI OF THE LEFT KNEE WITHOUT CONTRAST
TECHNIQUE: Multiplanar, multisequence MR imaging of the knee was performed. No
intravenous contrast was administered.

[Series 4: PD fat-sat · axial · 3.0mm · 0.50mm/px · z∈[-42,+70]mm · 8 of 35 slices shown (1 of 4)]
[im 1/35]
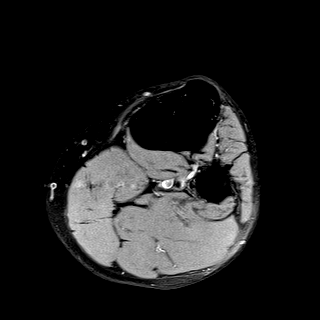
[im 5/35]
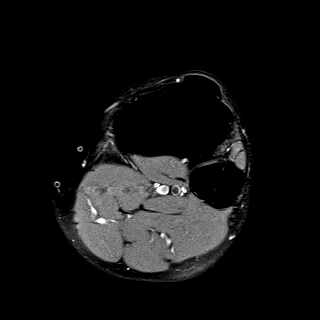
[im 10/35]
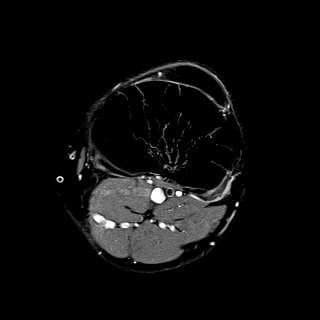
[im 15/35]
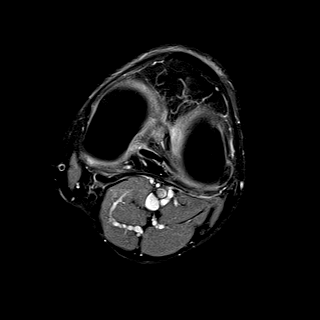
[im 20/35]
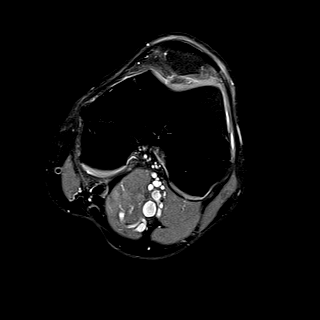
[im 25/35]
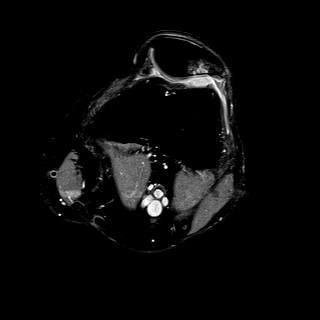
[im 30/35]
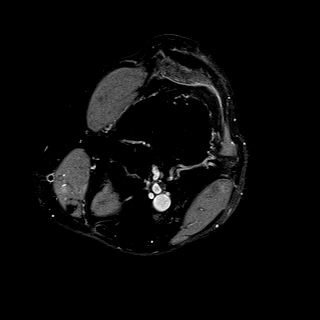
[im 35/35]
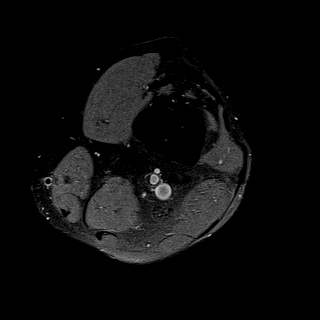

[Series 5: T1 · coronal · 3.0mm · 0.50mm/px · 4 of 27 slices shown]
[im 1/27]
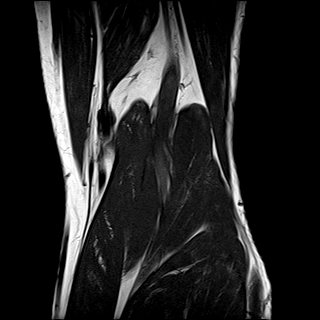
[im 5/27]
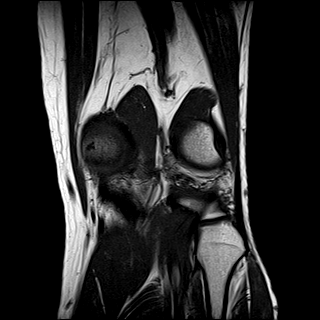
[im 9/27]
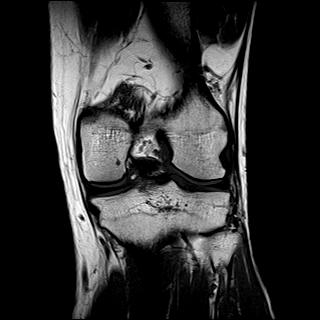
[im 14/27]
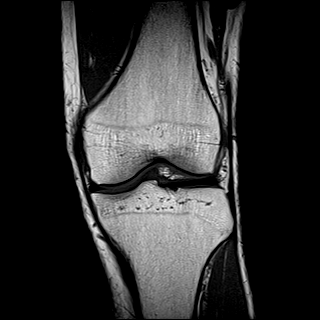

[Series 6: T2 fat-sat · coronal · 3.0mm · 0.31mm/px · 7 of 27 slices shown]
[im 1/27]
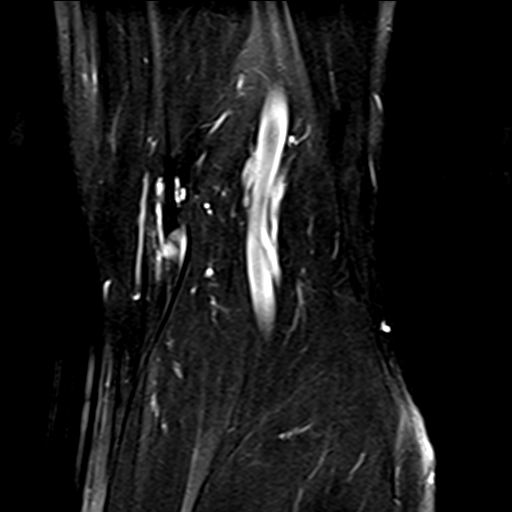
[im 5/27]
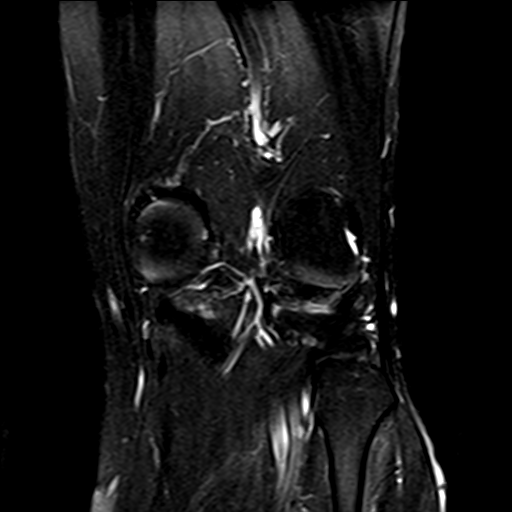
[im 9/27]
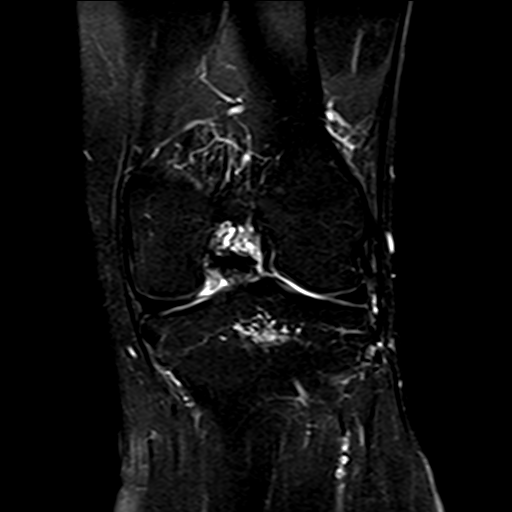
[im 14/27]
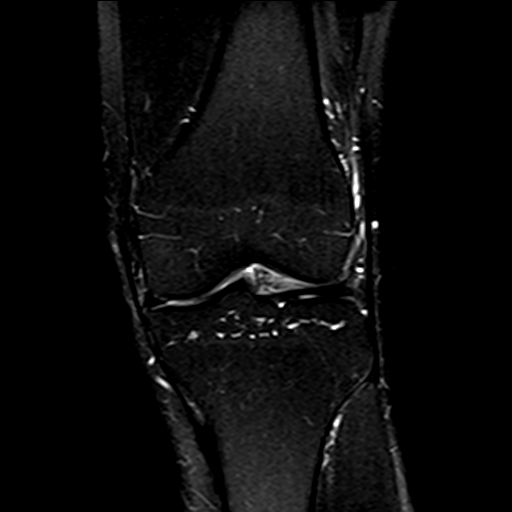
[im 18/27]
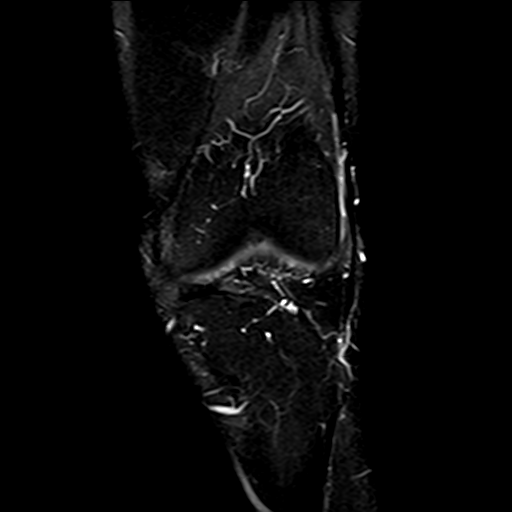
[im 22/27]
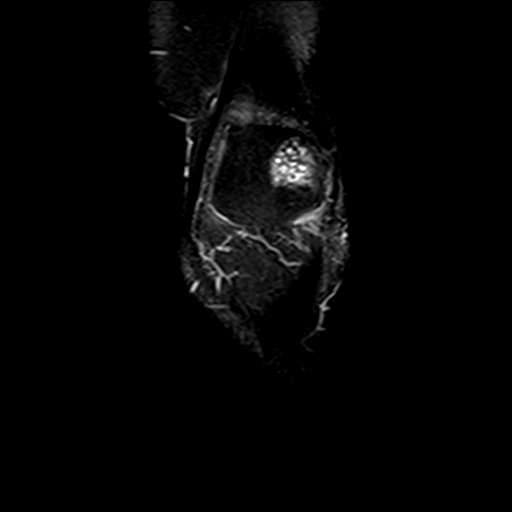
[im 27/27]
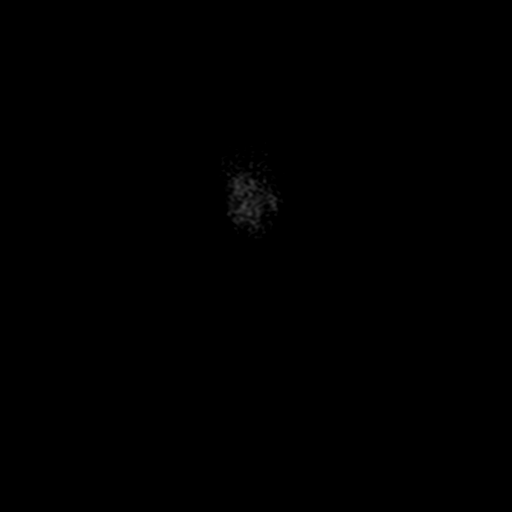

[Series 9: PD fat-sat · oblique · 2.0mm · 0.62mm/px · 4 of 17 slices shown (2 of 4)]
[im 1/17]
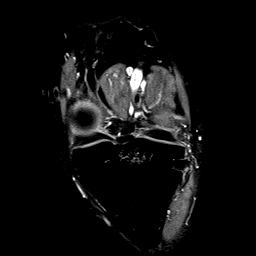
[im 6/17]
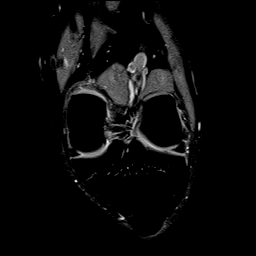
[im 11/17]
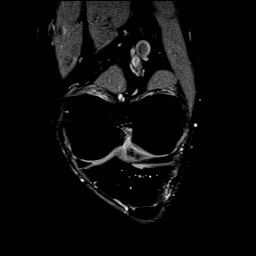
[im 17/17]
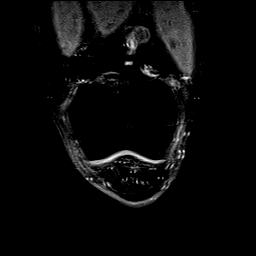

[Series 10: PD fat-sat · sagittal · 3.0mm · 0.50mm/px · 7 of 30 slices shown (3 of 4)]
[im 1/30]
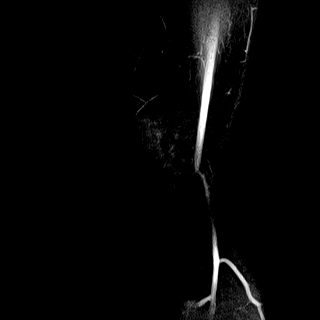
[im 5/30]
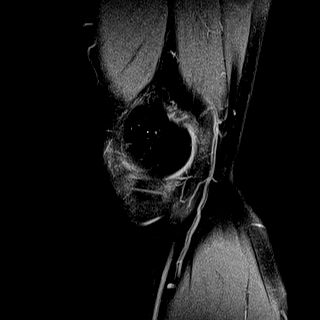
[im 10/30]
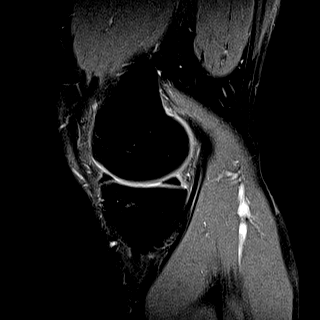
[im 15/30]
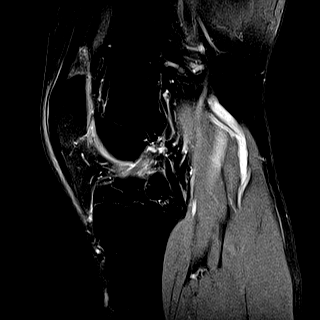
[im 20/30]
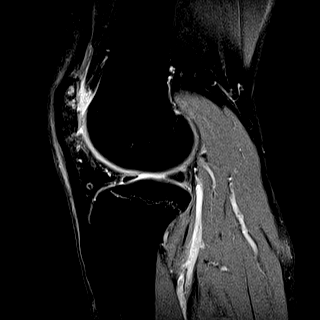
[im 25/30]
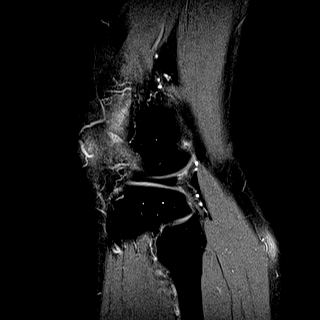
[im 30/30]
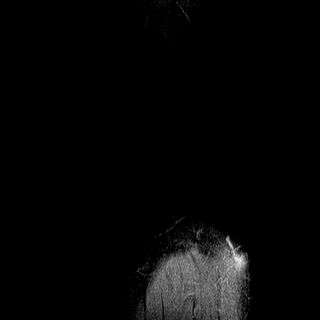

[Series 11: PD fat-sat · coronal · 3.0mm · 0.50mm/px · 7 of 27 slices shown (4 of 4)]
[im 1/27]
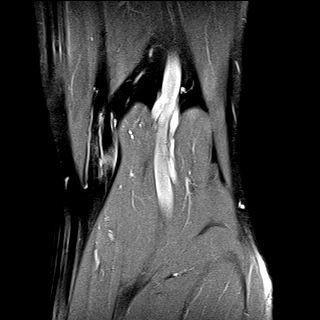
[im 5/27]
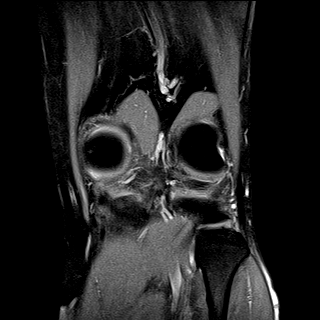
[im 9/27]
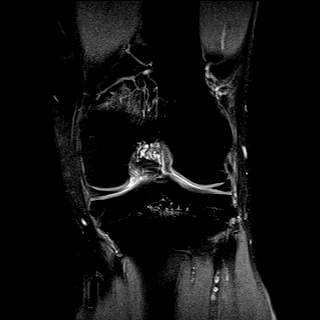
[im 14/27]
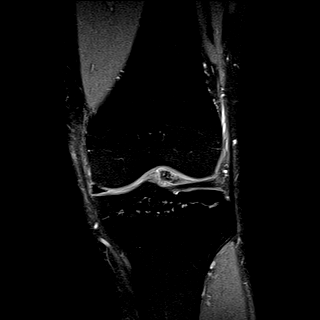
[im 18/27]
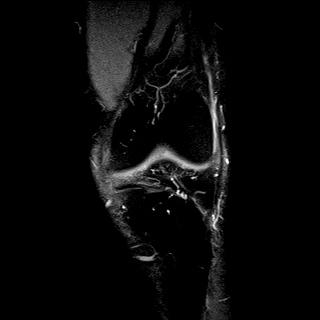
[im 22/27]
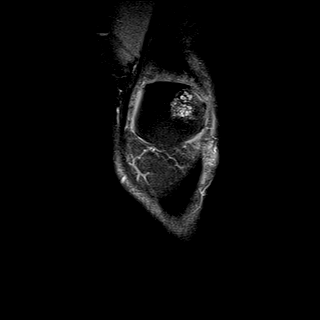
[im 27/27]
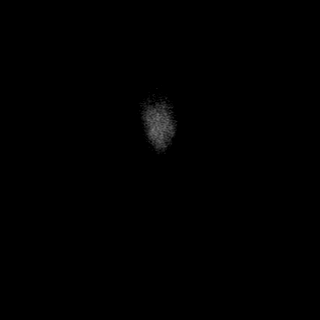

[37 of 40 positions shown; findings below may reference images not displayed]

FINDINGS: MENISCI

Medial meniscus:  Intact

Lateral meniscus:  Intact

LIGAMENTS

Cruciates:  Intact

Collaterals:  Intact

CARTILAGE

Patellofemoral: Advanced degenerative chondrosis involving the
lateral patellar facet with marked cartilage thinning and
subchondral cystic change and edema.

Medial:  Minimal degenerative chondrosis for age.

Lateral:  Minimal degenerative chondrosis for age.

Joint: No joint effusion. There is thickening and inflammation of
the quadriceps fat pad noted.

Popliteal Fossa:  No popliteal mass or Baker's cyst.

Extensor Mechanism: The patella retinacular structures are intact
and the quadriceps and patellar tendons are intact.

Bones:  No acute bony findings.

Other: Normal knee musculature.
IMPRESSION: 1. Advanced degenerative chondrosis along the lateral patellar facet
with underlying subchondral cystic change.
2. Intact ligamentous structures and no acute bony findings.
3. No meniscal tears.
4. No joint effusion or Baker's cyst.
5. Mild thickening and inflammation of the quadriceps fat pad.

## 2019-08-18 ENCOUNTER — Other Ambulatory Visit: Payer: Self-pay | Admitting: Sports Medicine

## 2019-08-23 ENCOUNTER — Ambulatory Visit: Payer: PPO | Admitting: Sports Medicine

## 2019-09-01 ENCOUNTER — Other Ambulatory Visit: Payer: Self-pay

## 2019-09-01 DIAGNOSIS — Z20822 Contact with and (suspected) exposure to covid-19: Secondary | ICD-10-CM | POA: Diagnosis not present

## 2019-09-02 ENCOUNTER — Other Ambulatory Visit: Payer: Self-pay

## 2019-09-02 ENCOUNTER — Encounter: Payer: Self-pay | Admitting: Sports Medicine

## 2019-09-02 ENCOUNTER — Ambulatory Visit (INDEPENDENT_AMBULATORY_CARE_PROVIDER_SITE_OTHER): Payer: PPO | Admitting: Sports Medicine

## 2019-09-02 DIAGNOSIS — I739 Peripheral vascular disease, unspecified: Secondary | ICD-10-CM | POA: Diagnosis not present

## 2019-09-02 DIAGNOSIS — E118 Type 2 diabetes mellitus with unspecified complications: Secondary | ICD-10-CM | POA: Diagnosis not present

## 2019-09-02 DIAGNOSIS — R350 Frequency of micturition: Secondary | ICD-10-CM | POA: Diagnosis not present

## 2019-09-02 DIAGNOSIS — Z992 Dependence on renal dialysis: Secondary | ICD-10-CM

## 2019-09-02 DIAGNOSIS — N186 End stage renal disease: Secondary | ICD-10-CM | POA: Diagnosis not present

## 2019-09-02 LAB — NOVEL CORONAVIRUS, NAA: SARS-CoV-2, NAA: NOT DETECTED

## 2019-09-02 MED ORDER — GLUCOSE BLOOD VI STRP: ORAL_STRIP | 12 refills | Status: DC

## 2019-09-02 NOTE — Assessment & Plan Note (Signed)
Urinalysis was clear. Michael Thomas continues to have episodes of urinary frequency. He denies any dribbling, weak stream, no nocturia. This sounds more like overactive bladder than obstructive uropathy so we started oxybutynin at night, not much improvement so we are going to increase to twice daily, max is 4 times daily. We can follow-up in 1 month to reevaluate, and augment dose if needed. Of note Myrbetriq was too expensive. This is a chronic disease not at goal, with pharmacologic intervention.

## 2019-09-02 NOTE — Patient Instructions (Signed)

## 2019-09-02 NOTE — Progress Notes (Signed)
    Procedures performed today:    None.  Independent interpretation of tests performed by another provider:   None.  Impression and Recommendations:    Urinary frequency Urinalysis was clear. Clance Boll continues to have episodes of urinary frequency. He denies any dribbling, weak stream, no nocturia. This sounds more like overactive bladder than obstructive uropathy so we started oxybutynin at night, not much improvement so we are going to increase to twice daily, max is 4 times daily. We can follow-up in 1 month to reevaluate, and augment dose if needed. Of note Myrbetriq was too expensive. This is a chronic disease not at goal, with pharmacologic intervention.    ___________________________________________ Gwen Her. Dianah Field, M.D., ABFM., CAQSM. Primary Care and [place] Instructor of [place] [employer]

## 2019-09-30 ENCOUNTER — Ambulatory Visit: Payer: PPO | Admitting: Sports Medicine

## 2019-10-03 ENCOUNTER — Ambulatory Visit (INDEPENDENT_AMBULATORY_CARE_PROVIDER_SITE_OTHER): Payer: PPO | Admitting: Sports Medicine

## 2019-10-03 ENCOUNTER — Encounter: Payer: Self-pay | Admitting: Sports Medicine

## 2019-10-03 ENCOUNTER — Other Ambulatory Visit: Payer: Self-pay

## 2019-10-03 DIAGNOSIS — I739 Peripheral vascular disease, unspecified: Secondary | ICD-10-CM

## 2019-10-03 DIAGNOSIS — R350 Frequency of micturition: Secondary | ICD-10-CM | POA: Diagnosis not present

## 2019-10-03 MED ORDER — OXYBUTYNIN CHLORIDE ER 5 MG PO TB24: 5.0000 mg | ORAL_TABLET | Freq: Four times a day (QID) | ORAL | 3 refills | Status: DC

## 2019-10-03 NOTE — Assessment & Plan Note (Signed)
Michael Thomas returns, he is a pleasant 74 year old male with well-controlled diabetes. For many months now he has had urinary urgency, he will get a sudden desire to void, he can hold it and has to go to the restroom. He denies any dribbling, weak stream, or nocturia. We obtained a urinalysis that was negative with the exception of a small amount of protein, urine cultures were negative. We started oxybutynin twice daily, going up to 4 times daily, he has not noted any improvement whatsoever. He has also been on Flomax without any improvement. In addition to going up to 4 times a day with oxybutynin I would like a second opinion and urodynamics with urology.

## 2019-10-03 NOTE — Assessment & Plan Note (Signed)
Michael Thomas also has a history of left calf claudication, it initially resolved post angioplasty, he is also having symptoms on the right, he is having recurrence of symptoms and will follow this up again with interventional radiology.

## 2019-10-03 NOTE — Progress Notes (Signed)
    Procedures performed today:    None.  Independent interpretation of tests performed by another provider:   None.  Impression and Recommendations:    Urinary frequency Michael Thomas returns, he is a pleasant 74 year old male with well-controlled diabetes. For many months now he has had urinary urgency, he will get a sudden desire to void, he can hold it and has to go to the restroom. He denies any dribbling, weak stream, or nocturia. We obtained a urinalysis that was negative with the exception of a small amount of protein, urine cultures were negative. We started oxybutynin twice daily, going up to 4 times daily, he has not noted any improvement whatsoever. He has also been on Flomax without any improvement. In addition to going up to 4 times a day with oxybutynin I would like a second opinion and urodynamics with urology.  Left calf claudication resolved post angioplasty Michael Thomas also has a history of left calf claudication, it initially resolved post angioplasty, he is also having symptoms on the right, he is having recurrence of symptoms and will follow this up again with interventional radiology.    ___________________________________________ Gwen Her. Dianah Field, M.D., ABFM., CAQSM. Primary Care and [place] Instructor of [place] of [facility]

## 2019-10-22 ENCOUNTER — Other Ambulatory Visit: Payer: Self-pay | Admitting: Sports Medicine

## 2019-10-22 DIAGNOSIS — E1169 Type 2 diabetes mellitus with other specified complication: Secondary | ICD-10-CM

## 2019-10-22 DIAGNOSIS — E118 Type 2 diabetes mellitus with unspecified complications: Secondary | ICD-10-CM

## 2019-11-06 ENCOUNTER — Other Ambulatory Visit: Payer: Self-pay | Admitting: Sports Medicine

## 2019-11-06 DIAGNOSIS — I10 Essential (primary) hypertension: Secondary | ICD-10-CM

## 2019-11-06 DIAGNOSIS — N138 Other obstructive and reflux uropathy: Secondary | ICD-10-CM

## 2019-11-28 DIAGNOSIS — N401 Enlarged prostate with lower urinary tract symptoms: Secondary | ICD-10-CM | POA: Diagnosis not present

## 2019-11-28 DIAGNOSIS — R3915 Urgency of urination: Secondary | ICD-10-CM | POA: Diagnosis not present

## 2019-11-28 DIAGNOSIS — R350 Frequency of micturition: Secondary | ICD-10-CM | POA: Diagnosis not present

## 2019-11-28 DIAGNOSIS — R351 Nocturia: Secondary | ICD-10-CM | POA: Diagnosis not present

## 2020-01-17 DIAGNOSIS — R3915 Urgency of urination: Secondary | ICD-10-CM | POA: Diagnosis not present

## 2020-01-17 DIAGNOSIS — R350 Frequency of micturition: Secondary | ICD-10-CM | POA: Diagnosis not present

## 2020-02-12 ENCOUNTER — Ambulatory Visit: Payer: PPO | Admitting: Sports Medicine

## 2020-02-13 ENCOUNTER — Other Ambulatory Visit: Payer: Self-pay

## 2020-02-13 ENCOUNTER — Ambulatory Visit (INDEPENDENT_AMBULATORY_CARE_PROVIDER_SITE_OTHER): Payer: PPO | Admitting: Sports Medicine

## 2020-02-13 ENCOUNTER — Encounter: Payer: Self-pay | Admitting: Sports Medicine

## 2020-02-13 VITALS — BP 135/69 | HR 69 | Ht 62.0 in | Wt 134.0 lb

## 2020-02-13 DIAGNOSIS — N138 Other obstructive and reflux uropathy: Secondary | ICD-10-CM | POA: Diagnosis not present

## 2020-02-13 DIAGNOSIS — E1169 Type 2 diabetes mellitus with other specified complication: Secondary | ICD-10-CM | POA: Diagnosis not present

## 2020-02-13 DIAGNOSIS — R350 Frequency of micturition: Secondary | ICD-10-CM

## 2020-02-13 DIAGNOSIS — N401 Enlarged prostate with lower urinary tract symptoms: Secondary | ICD-10-CM | POA: Diagnosis not present

## 2020-02-13 DIAGNOSIS — R21 Rash and other nonspecific skin eruption: Secondary | ICD-10-CM

## 2020-02-13 DIAGNOSIS — E118 Type 2 diabetes mellitus with unspecified complications: Secondary | ICD-10-CM | POA: Diagnosis not present

## 2020-02-13 LAB — POCT GLYCOSYLATED HEMOGLOBIN (HGB A1C): Hemoglobin A1C: 7 % — AB (ref 4.0–5.6)

## 2020-02-13 MED ORDER — SITAGLIPTIN PHOSPHATE 50 MG PO TABS: 50.0000 mg | ORAL_TABLET | Freq: Every day | ORAL | 3 refills | Status: DC

## 2020-02-13 NOTE — Assessment & Plan Note (Signed)
Michael Thomas has BPH, he has controlled diabetes type 2, he has continued to have random urges to urinate, urinalysis has consistently been negative with the exception of protein. We have tried oxybutynin, up to 4 times daily without any improvement, Flomax did not improve his symptoms, I did refer him to urology for a second opinion and potential urodynamic studies, Myrbetriq was started which helped his symptoms slightly. It sounds as though they also tried an anticholinergic patch which really did not give much improvement. The neck step is pelvic physical therapy which I think is a good idea.

## 2020-02-13 NOTE — Assessment & Plan Note (Signed)
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 We can also potentially do a biopsy at that time. I would like the front office to not put any block up to double book him if need be.

## 2020-02-13 NOTE — Progress Notes (Signed)
    Procedures performed today:    None.  Independent interpretation of notes and tests performed by another provider:   None.  Brief History, Exam, Impression, and Recommendations:    Benign prostatic hyperplasia with urinary obstruction Continues to work with urology.  Urinary frequency Clance Boll has BPH, he has controlled diabetes type 2, he has continued to have random urges to urinate, urinalysis has consistently been negative with the exception of protein. We have tried oxybutynin, up to 4 times daily without any improvement, Flomax did not improve his symptoms, I did refer him to urology for a second opinion and potential urodynamic studies, Myrbetriq was started which helped his symptoms slightly. It sounds as though they also tried an anticholinergic patch which really did not give much improvement. The neck step is pelvic physical therapy which I think is a good idea.  Type 2 diabetes mellitus with complication, without long-term current use of insulin (HCC) A1c continues to be 7%, Clance Boll is also continuing to complain of episodes of hypoglycemia into the 50s and 60s, symptomatic. We had cut his glipizide down to 5 mg twice daily, at this point we are going to fully discontinue glipizide, continue Metformin twice a day, switch Januvia to nighttime, return to see me in 3 months for repeat A1c.  Rash Unclear etiology, it sounds as though there are intermittent episodes of scalp pustulosis, he has been to multiple dermatologist without an answer, he does not really have active symptoms right now, if these develop then I think we should double book him for immediate inspection. We can also potentially do a biopsy at that time. I would like the front office to not put any block up to double book him if need be.  I spent 40 minutes of total time managing this patient today, this includes chart review, face to face, and non-face to face  time.   ___________________________________________ Gwen Her. Dianah Field, M.D., ABFM., CAQSM. Primary Care and [place] Instructor of [facility]

## 2020-02-13 NOTE — Assessment & Plan Note (Signed)
A1c continues to be 7%, Michael Thomas is also continuing to complain of episodes of hypoglycemia into the 50s and 60s, symptomatic. We had cut his glipizide down to 5 mg twice daily, at this point we are going to fully discontinue glipizide, continue Metformin twice a day, switch Januvia to nighttime, return to see me in 3 months for repeat A1c.

## 2020-02-13 NOTE — Assessment & Plan Note (Signed)
Continues to work with urology.

## 2020-02-16 ENCOUNTER — Other Ambulatory Visit: Payer: Self-pay | Admitting: Sports Medicine

## 2020-02-17 ENCOUNTER — Other Ambulatory Visit: Payer: Self-pay | Admitting: Sports Medicine

## 2020-02-17 DIAGNOSIS — E785 Hyperlipidemia, unspecified: Secondary | ICD-10-CM

## 2020-02-18 ENCOUNTER — Other Ambulatory Visit: Payer: Self-pay

## 2020-02-18 DIAGNOSIS — E785 Hyperlipidemia, unspecified: Secondary | ICD-10-CM

## 2020-02-18 MED ORDER — FENOFIBRATE 160 MG PO TABS: 160.0000 mg | ORAL_TABLET | Freq: Every day | ORAL | 0 refills | Status: DC

## 2020-02-18 MED ORDER — OMEGA-3-ACID ETHYL ESTERS 1 G PO CAPS: 2.0000 g | ORAL_CAPSULE | Freq: Two times a day (BID) | ORAL | 0 refills | Status: DC

## 2020-03-02 ENCOUNTER — Other Ambulatory Visit: Payer: Self-pay | Admitting: Sports Medicine

## 2020-04-12 ENCOUNTER — Other Ambulatory Visit: Payer: Self-pay | Admitting: Sports Medicine

## 2020-04-12 DIAGNOSIS — E1169 Type 2 diabetes mellitus with other specified complication: Secondary | ICD-10-CM

## 2020-04-12 DIAGNOSIS — E118 Type 2 diabetes mellitus with unspecified complications: Secondary | ICD-10-CM

## 2020-04-20 ENCOUNTER — Other Ambulatory Visit: Payer: Self-pay

## 2020-04-20 MED ORDER — SIMVASTATIN 40 MG PO TABS: 40.0000 mg | ORAL_TABLET | Freq: Every day | ORAL | 1 refills | Status: DC

## 2020-04-21 ENCOUNTER — Other Ambulatory Visit: Payer: Self-pay | Admitting: Interventional Radiology

## 2020-04-21 DIAGNOSIS — I739 Peripheral vascular disease, unspecified: Secondary | ICD-10-CM

## 2020-04-29 ENCOUNTER — Encounter: Payer: Self-pay | Admitting: *Deleted

## 2020-04-29 ENCOUNTER — Ambulatory Visit
Admission: RE | Admit: 2020-04-29 | Discharge: 2020-04-29 | Disposition: A | Discharge status: Home / Self Care | Payer: PPO | Source: Ambulatory Visit | Attending: Interventional Radiology | Admitting: Interventional Radiology

## 2020-04-29 DIAGNOSIS — I739 Peripheral vascular disease, unspecified: Secondary | ICD-10-CM | POA: Diagnosis not present

## 2020-04-29 DIAGNOSIS — Z9889 Other specified postprocedural states: Secondary | ICD-10-CM | POA: Diagnosis not present

## 2020-04-29 HISTORY — PX: IR RADIOLOGIST EVAL & MGMT: IMG5224

## 2020-04-29 NOTE — Progress Notes (Signed)
Chief Complaint: PAD  Referring Physician(s): Dr. Dianah Field  History of Present Illness: Michael Thomas a 74 y.o.malepresenting today for a scheduled follow up of known PAD.  Michael Thomas joins [place] today in the clinic by himself.   He is SP treatment of Rutherford 3 RLE symptomatic lesion 05/14/2019.  He is also SP treatment of left SFA for critical stenosis July of 2019.   Today he complains that his left leg symptoms have returned, with short distance claudication.  He enjoys walking in the neighborhood, as well as on the treadmill, and finds that when he starts to work out on the treadmill he begins having significant cramping in the left calf.  This starts in 2-3 minutes of walking at low speeds.    He denies any rest pain or night pain.  He denies any interval wound formation.   He has repeat non-invasive today: Right resting ABI: 1.08 Post exercise ABI: 1.0  Left resting ABI: 1.0 Post exercise ABI: 0.46 Segmental exam shows evidence of distal fem-pop disease.   He very much would like to be symptom free when walking/exercising, given he enjoys going on long walks with his wife, both on the street in the neighborhood as well as on hiking trails, and also continues to enjoy running.   He continues maximal medical therapy, and feels he has recovered completely.   Past Medical History:  Diagnosis Date  . Benign prostatic hyperplasia with urinary obstruction 03/25/2015  . Diabetes mellitus without complication ([place])   . Hyperlipidemia   . Kidney stone   . OSA (obstructive sleep apnea)   . Vitamin D deficiency     Past Surgical History:  Procedure Laterality Date  . DG ARTHRO THUMB*L*    . IR ANGIOGRAM EXTREMITY BILATERAL  02/20/2018  . IR ANGIOGRAM EXTREMITY BILATERAL  04/05/2019  . IR ANGIOGRAM SELECTIVE EACH ADDITIONAL VESSEL  02/20/2018  . IR FEM POP ART ATHERECT INC PTA MOD SED  02/20/2018  . IR FEM POP ART ATHERECT INC PTA MOD SED  05/14/2019  .  IR RADIOLOGIST EVAL & MGMT  02/01/2018  . IR RADIOLOGIST EVAL & MGMT  03/20/2018  . IR RADIOLOGIST EVAL & MGMT  07/25/2018  . IR RADIOLOGIST EVAL & MGMT  03/07/2019  . IR RADIOLOGIST EVAL & MGMT  04/23/2019  . IR RADIOLOGIST EVAL & MGMT  06/18/2019  . IR RADIOLOGIST EVAL & MGMT  04/29/2020  . IR US GUIDE VASC ACCESS LEFT  04/05/2019  . IR US GUIDE VASC ACCESS RIGHT  02/20/2018  . IR US GUIDE VASC ACCESS RIGHT  05/14/2019  . NECK SURGERY    . VASECTOMY      Allergies: Amoxicillin  Medications: Prior to Admission medications   Medication Sig Start Date End Date Taking? Authorizing Provider  aspirin EC 81 MG tablet Take 81 mg by mouth daily.     [provider]  fenofibrate 160 MG tablet Take 1 tablet (160 mg total) by mouth daily. 02/18/20   Silverio Decamp, MD  glucose blood test strip Use as instructed 09/02/19   Silverio Decamp, MD  Lancets [facility] ULTRASOFT) lancets USE AS INSTRUCTED 03/02/20   Silverio Decamp, MD  lisinopril (ZESTRIL) 20 MG tablet TAKE 1 TABLET BY MOUTH EVERY DAY 11/06/19   Silverio Decamp, MD  metFORMIN (GLUCOPHAGE) 1000 MG tablet TAKE 1 TABLET (1,000 MG TOTAL) BY MOUTH 2 (TWO) TIMES DAILY WITH A MEAL. 02/17/20   Silverio Decamp, MD  mirabegron ER [facility])  50 MG TB24 tablet Take 1 tablet (50 mg total) by mouth daily. 02/13/20   Silverio Decamp, MD  omega-3 acid ethyl esters (LOVAZA) 1 g capsule Take 2 capsules (2 g total) by mouth 2 (two) times daily. 02/18/20   Silverio Decamp, MD  simvastatin (ZOCOR) 40 MG tablet Take 1 tablet (40 mg total) by mouth daily at 6 PM. 04/20/20   Silverio Decamp, MD  sitaGLIPtin (JANUVIA) 50 MG tablet Take 1 tablet (50 mg total) by mouth at bedtime. 02/13/20   Silverio Decamp, MD  tamsulosin (FLOMAX) 0.4 MG CAPS capsule TAKE 1 CAPSULE BY MOUTH 2 TIMES DAILY. 11/06/19   Silverio Decamp, MD     Family History  Problem Relation Age of Onset  . Stroke Mother   . Diabetes  Mellitus II Mother   . Prostate cancer Neg Hx   . Kidney cancer Neg Hx   . Bladder Cancer Neg Hx     Social History   Socioeconomic History  . Marital status: Married    Spouse name: Not on file  . Number of children: Not on file  . Years of education: Not on file  . Highest education level: Not on file  Occupational History  . Not on file  Tobacco Use  . Smoking status: Never Smoker  . Smokeless tobacco: Never Used  Substance and Sexual Activity  . Alcohol use: No  . Drug use: No  . Sexual activity: Yes    Birth control/protection: None  Other Topics Concern  . Not on file  Social History Narrative  . Not on file   Social Determinants of Health   Financial Resource Strain:   . Difficulty of Paying Living Expenses: Not on file  Food Insecurity:   . Worried About Charity fundraiser in the Last Year: Not on file  . Ran Out of Food in the Last Year: Not on file  Transportation Needs:   . Lack of Transportation (Medical): Not on file  . Lack of Transportation (Non-Medical): Not on file  Physical Activity:   . Days of Exercise per Week: Not on file  . Minutes of Exercise per Session: Not on file  Stress:   . Feeling of Stress : Not on file  Social Connections:   . Frequency of Communication with Friends and Family: Not on file  . Frequency of Social Gatherings with Friends and Family: Not on file  . Attends Religious Services: Not on file  . Active Member of Clubs or Organizations: Not on file  . Attends Archivist Meetings: Not on file  . Marital Status: Not on file       Review of Systems: A 12 point ROS discussed and pertinent positives are indicated in the HPI above.  All other systems are negative.  Review of Systems  Vital Signs: There were no vitals taken for this visit.  Physical Exam General: 74 yo male appearing younger than stated age.  Well-developed, well-nourished.  No distress. HEENT: Atraumatic, normocephalic.  Glasses. Conjugate  gaze, extra-ocular motor intact. He is wearing mask.  Neck: Symmetric with no goiter enlargement.  Chest/Lungs:  Symmetric chest with inspiration/expiration.  No labored breathing.    Heart:   No JVD appreciated.   Genito-urinary: Deferred Neurologic: Alert & Oriented to person, place, and time.   Normal affect and insight.  Appropriate questions.  Moving all 4 extremities with gross sensory intact.  Pulse Exam:  Palpable right PT and DP.  Palpable  left DP, with doppler positive PT.  Extremities: No wound.  Capillary refill prompt. Hairless.     Mallampati Score:     Imaging: US ARTERIAL SEGMENTAL EXERCISE (USE FOR CLAUDICATION)  Result Date: 04/29/2020 CLINICAL DATA:  74 year old male with a history of claudication EXAM: NONINVASIVE PHYSIOLOGIC VASCULAR STUDY OF BILATERAL LOWER EXTREMITIES TECHNIQUE: Evaluation of both lower extremities was performed at rest, including calculation of ankle-brachial indices, multiple segmental pressure evaluation, segmental Doppler and segmental pulse volume recording. COMPARISON:  06/18/2019 FINDINGS: Right: Resting ankle brachial index:  1.08 Post exercise ABI: 1.0 Segmental blood pressure: Upper extremity pressures are symmetric. Appropriate increased to the thigh. No significant drop between segments. Digital pressure measures 553 systolic. Doppler: Segmental Doppler triphasic throughout the femoropopliteal segment. Triphasic posterior tibial artery. Monophasic dorsalis pedis. Pulse volume recording: Segmental PVR maintained throughout the right lower extremity. Left: Resting ankle brachial index: 1.0 Post exercise ABI: 0.46 Segmental blood pressure: Upper extremity pressures are symmetric. Appropriate increased to the thigh. The low thigh is greater than 30 mm of mercury below the high thigh. No significant drop between segments of the calf to ankle. Doppler: Segmental Doppler demonstrates triphasic femoral waveform. Suspect some artifact of the popliteal  waveform, which I believe is triphasic. Monophasic tibial arteries. Pulse volume recording: Segmental PVR maintained. Additional: IMPRESSION: Right: Resting and post exercise ABI within normal limits, with the segmental exam demonstrating no significant arterial occlusive disease. Left: Resting ABI within normal limits, with the post exercise ABI falling to the moderate range of arterial occlusive disease. Segmental exam demonstrates femoropopliteal disease. Signed, Dulcy Fanny. Dellia Nims, RPVI [facility] Electronically Signed   By: Corrie Mckusick D.O.   On: 04/29/2020 16:11   IR Radiologist Eval & Mgmt  Result Date: 04/29/2020 Please refer to notes tab for details about interventional procedure. (Op Note)   Labs:  CBC: Recent Labs    05/14/19 0740  WBC 6.3  HGB 15.4  HCT 46.2  PLT 328    COAGS: Recent Labs    05/14/19 0740  INR 1.1    BMP: Recent Labs    05/14/19 0740  NA 138  K 4.6  CL 103  CO2 25  GLUCOSE 172*  BUN 24*  CALCIUM 9.9  CREATININE 1.42*  GFRNONAA 48*  GFRAA 56*    LIVER FUNCTION TESTS: No results for input(s): BILITOT, AST, ALT, ALKPHOS, PROT, ALBUMIN in the last 8760 hours.  TUMOR MARKERS: No results for input(s): AFPTM, CEA, CA199, CHROMGRNA in the last 8760 hours.   Assessment:  Michael Thomas is a 52 presenting with life-style limiting, short-distance claudication of the left leg, compatible with Rutherford 3 class symptoms.        Non-invasive lower extremity exam confirms left femoral-popliteal disease, potentially recurrent stenosis at the previously treated site.   I had discussion with him regarding anatomy, pathology/pathophysiology, natural history, and prognosis of PAD/CLI, including a discussion regarding the durability of our different methods of endovascular treatment of fem-pop disease.   I discussed conservative approach with him, with walking program and initiating BID pletal.    He was not interested in walking program with conservative therapy, given his significant symptoms, desire to be symptom free, and the fact that he is already very active with treadmill walking/running, and he remains symptomatic.   Patient has elected to proceed with endovascular options.   Regarding endovascular options, specific risks discussed include: bleeding, infection, contrast reaction, renal injury/nephropathy, arterial injury/dissection, need for additional procedure/surgery, worsening symptoms/tissue  including limb loss, cardiopulmonary collapse, death.    We will plan on proceeding with aortoperipheral angiogram and possible intervention, targeting the left lower extremity, at first available with Dr. Earleen Newport  Plan: -Plan is to proceed with aorto-peripheral angiogram and possible intervention, with Dr. Earleen Newport at [facility].  - I gave Michael Thomas a Rx for clopidogrel 75mg  PO daily.  I have asked him to start this medication ~5 days before his scheduled procedure, in addition to 81mg  ASA daily.  - continue maximal medical therapy for cardiovascular risk reduction    ___________________________________________________________________   1Morley Kos MD, et al. 2016 AHA/ACC Guideline on the Management of Patients With Lower Extremity Peripheral Artery Disease: Executive Summary: A Report of the American College of Cardiology/American Heart Association Task Force on Clinical Practice Guidelines. J Am Coll Cardiol. 2017 Mar 21;69(11):1465-1508. doi: 10.1016/j.jacc.2016.11.008.   2 - Norgren L, et al. TASC II Working Group. Inter-society consensus for the management of peripheral arterial disease. Int Tressia Miners. 2007 Jun;26(2):81-157. Review. PubMed PMID: 09811914  3 - Hingorani A, et al. The management of diabetic foot: A clinical practice guideline by the Society for Vascular Surgery in collaboration with the [place] and the Society  for Vascular Medicine. J Vasc  Surg. 2016 Feb;63(2 Suppl):3S-21S. doi: 10.1016/j.jvs.2015.10.003. PubMed PMID: 78295621.  4 - Corinna Gab, Saab FA, Luberta Mutter, Grant Ruts, Ewell Poe, Driver VR, McPherson, Lookstein R, van den Baldemar Lenis, Jaff Michael, Guadalupe Dawn, Henao S, AlMahameed A, Katzen B. Digital Subtraction Angiography Prior to an Amputation for Critical Limb Ischemia (CLI): An Expert Recommendation Statement From the CLI Global Society to Optimize Limb Salvage. J Endovasc Ther. 2020 Aug;27(4):540-546. doi: 10.1177/1526602820928590. Epub 2020 May 29. PMID: 30865784.    Thank you for this interesting consult.  I greatly enjoyed meeting Michael Thomas and look forward to participating in their care.  A copy of this report was sent to the requesting provider on this date.  Electronically Signed: Corrie Mckusick 04/29/2020, 4:26 PM   I spent a total of    40 Minutes in face to face in clinical consultation, greater than 50% of which was counseling/coordinating care for recurrent left leg Rutherford 3 symptoms of PAD, possible angiogram and intervention

## 2020-05-01 ENCOUNTER — Other Ambulatory Visit (HOSPITAL_COMMUNITY): Payer: Self-pay | Admitting: Interventional Radiology

## 2020-05-01 DIAGNOSIS — I739 Peripheral vascular disease, unspecified: Secondary | ICD-10-CM

## 2020-05-05 ENCOUNTER — Telehealth (HOSPITAL_COMMUNITY): Payer: Self-pay | Admitting: Radiology

## 2020-05-05 NOTE — Telephone Encounter (Signed)
Called pt to schedule leg angio w/ possible intervention with Dr. Earleen Newport. Left VM for pt to return my call to schedule. JM

## 2020-05-06 ENCOUNTER — Telehealth: Payer: Self-pay

## 2020-05-06 ENCOUNTER — Other Ambulatory Visit: Payer: Self-pay | Admitting: Physician Assistant

## 2020-05-06 ENCOUNTER — Other Ambulatory Visit: Payer: Self-pay

## 2020-05-06 DIAGNOSIS — N138 Other obstructive and reflux uropathy: Secondary | ICD-10-CM

## 2020-05-06 MED ORDER — TAMSULOSIN HCL 0.4 MG PO CAPS: 0.4000 mg | ORAL_CAPSULE | Freq: Two times a day (BID) | ORAL | 1 refills | Status: DC

## 2020-05-06 NOTE — Telephone Encounter (Signed)
Per Dr. Johny Shears request, I phoned in Clopidogrel 75mg  PO, take one a day, #90, no refills to patient's pharmacy in [employer] in [facility] [place].

## 2020-05-07 ENCOUNTER — Ambulatory Visit (HOSPITAL_COMMUNITY)
Admission: RE | Admit: 2020-05-07 | Discharge: 2020-05-07 | Disposition: A | Discharge status: Home / Self Care | Payer: PPO | Source: Ambulatory Visit | Attending: Interventional Radiology | Admitting: Interventional Radiology

## 2020-05-07 ENCOUNTER — Other Ambulatory Visit: Payer: Self-pay | Admitting: Radiology

## 2020-05-07 ENCOUNTER — Other Ambulatory Visit (HOSPITAL_COMMUNITY): Payer: Self-pay | Admitting: Interventional Radiology

## 2020-05-07 ENCOUNTER — Other Ambulatory Visit: Payer: Self-pay

## 2020-05-07 DIAGNOSIS — Z7982 Long term (current) use of aspirin: Secondary | ICD-10-CM | POA: Insufficient documentation

## 2020-05-07 DIAGNOSIS — Z79899 Other long term (current) drug therapy: Secondary | ICD-10-CM | POA: Insufficient documentation

## 2020-05-07 DIAGNOSIS — N400 Enlarged prostate without lower urinary tract symptoms: Secondary | ICD-10-CM | POA: Diagnosis not present

## 2020-05-07 DIAGNOSIS — E1151 Type 2 diabetes mellitus with diabetic peripheral angiopathy without gangrene: Secondary | ICD-10-CM | POA: Diagnosis not present

## 2020-05-07 DIAGNOSIS — Z7902 Long term (current) use of antithrombotics/antiplatelets: Secondary | ICD-10-CM | POA: Insufficient documentation

## 2020-05-07 DIAGNOSIS — Z7984 Long term (current) use of oral hypoglycemic drugs: Secondary | ICD-10-CM | POA: Insufficient documentation

## 2020-05-07 DIAGNOSIS — I70221 Atherosclerosis of native arteries of extremities with rest pain, right leg: Secondary | ICD-10-CM | POA: Diagnosis not present

## 2020-05-07 DIAGNOSIS — E785 Hyperlipidemia, unspecified: Secondary | ICD-10-CM | POA: Insufficient documentation

## 2020-05-07 DIAGNOSIS — I739 Peripheral vascular disease, unspecified: Secondary | ICD-10-CM

## 2020-05-07 DIAGNOSIS — E559 Vitamin D deficiency, unspecified: Secondary | ICD-10-CM | POA: Diagnosis not present

## 2020-05-07 DIAGNOSIS — G4733 Obstructive sleep apnea (adult) (pediatric): Secondary | ICD-10-CM | POA: Diagnosis not present

## 2020-05-07 DIAGNOSIS — I70212 Atherosclerosis of native arteries of extremities with intermittent claudication, left leg: Secondary | ICD-10-CM | POA: Diagnosis not present

## 2020-05-07 HISTORY — PX: IR FEM POP ART ATHERECT INC PTA MOD SED: IMG2310

## 2020-05-07 HISTORY — PX: IR ANGIOGRAM EXTREMITY LEFT: IMG651

## 2020-05-07 HISTORY — PX: IR US GUIDE VASC ACCESS RIGHT: IMG2390

## 2020-05-07 LAB — CBC
HCT: 42.1 % (ref 39.0–52.0)
Hemoglobin: 13.5 g/dL (ref 13.0–17.0)
MCH: 28.3 pg (ref 26.0–34.0)
MCHC: 32.1 g/dL (ref 30.0–36.0)
MCV: 88.3 fL (ref 80.0–100.0)
Platelets: 330 10*3/uL (ref 150–400)
RBC: 4.77 MIL/uL (ref 4.22–5.81)
RDW: 13.6 % (ref 11.5–15.5)
WBC: 6.2 10*3/uL (ref 4.0–10.5)
nRBC: 0 % (ref 0.0–0.2)

## 2020-05-07 LAB — GLUCOSE, CAPILLARY: Glucose-Capillary: 124 mg/dL — ABNORMAL HIGH (ref 70–99)

## 2020-05-07 LAB — BASIC METABOLIC PANEL
Anion gap: 11 (ref 5–15)
BUN: 25 mg/dL — ABNORMAL HIGH (ref 8–23)
CO2: 20 mmol/L — ABNORMAL LOW (ref 22–32)
Calcium: 9.3 mg/dL (ref 8.9–10.3)
Chloride: 106 mmol/L (ref 98–111)
Creatinine, Ser: 1.35 mg/dL — ABNORMAL HIGH (ref 0.61–1.24)
GFR calc Af Amer: 59 mL/min — ABNORMAL LOW (ref >60)
GFR calc non Af Amer: 51 mL/min — ABNORMAL LOW (ref >60)
Glucose, Bld: 134 mg/dL — ABNORMAL HIGH (ref 70–99)
Potassium: 4.4 mmol/L (ref 3.5–5.1)
Sodium: 137 mmol/L (ref 135–145)

## 2020-05-07 LAB — PROTIME-INR
INR: 1 (ref 0.8–1.2)
Prothrombin Time: 12.8 seconds (ref 11.4–15.2)

## 2020-05-07 LAB — POCT ACTIVATED CLOTTING TIME: Activated Clotting Time: 230 seconds

## 2020-05-07 MED ORDER — LIDOCAINE HCL (PF) 1 % IJ SOLN
INTRAMUSCULAR | Status: AC
  Filled 2020-05-07: qty 30

## 2020-05-07 MED ORDER — MIDAZOLAM HCL 2 MG/2ML IJ SOLN
INTRAMUSCULAR | Status: AC
  Filled 2020-05-07: qty 2

## 2020-05-07 MED ORDER — FENTANYL CITRATE (PF) 100 MCG/2ML IJ SOLN
INTRAMUSCULAR | Status: AC
  Filled 2020-05-07: qty 2

## 2020-05-07 MED ORDER — HEPARIN SODIUM (PORCINE) 1000 UNIT/ML IJ SOLN
INTRAMUSCULAR | Status: AC
  Filled 2020-05-07: qty 1

## 2020-05-07 MED ORDER — HEPARIN SODIUM (PORCINE) 1000 UNIT/ML IJ SOLN
INTRAMUSCULAR | Status: AC | PRN
  Administered 2020-05-07: 7000 [IU] via INTRAVENOUS

## 2020-05-07 MED ORDER — PROTAMINE SULFATE 10 MG/ML IV SOLN
50.0000 mg | Freq: Once | INTRAVENOUS | Status: DC
  Filled 2020-05-07 (×2): qty 5

## 2020-05-07 MED ORDER — MIDAZOLAM HCL 2 MG/2ML IJ SOLN
INTRAMUSCULAR | Status: AC | PRN
  Administered 2020-05-07: 0.5 mg via INTRAVENOUS
  Administered 2020-05-07: 1 mg via INTRAVENOUS

## 2020-05-07 MED ORDER — SODIUM CHLORIDE 0.9 % IV SOLN: INTRAVENOUS | Status: DC

## 2020-05-07 MED ORDER — FENTANYL CITRATE (PF) 100 MCG/2ML IJ SOLN
INTRAMUSCULAR | Status: AC | PRN
  Administered 2020-05-07: 25 ug via INTRAVENOUS
  Administered 2020-05-07: 50 ug via INTRAVENOUS

## 2020-05-07 MED ORDER — IOHEXOL 300 MG/ML  SOLN
100.0000 mL | Freq: Once | INTRAMUSCULAR | Status: AC | PRN
  Administered 2020-05-07: 75 mL via INTRA_ARTERIAL

## 2020-05-07 NOTE — H&P (Signed)
Chief Complaint: Patient was seen in consultation today for left leg angiogram with possible intervention.   Referring Physician(s): Dr. Dianah Field  Supervising Physician: Corrie Mckusick  Patient Status: [facility] - Out-pt  History of Present Illness: Michael Thomas is a 74 y.o. male with a medical history significant for BPH, DM2, obstructive sleep apnea and PAD. He is s/ptreatment of Rutherford 3 RLE symptomatic lesion 05/14/2019. He is also s/p treatment of left SFA for critical stenosis July of 2019. He met with  Dr. Earleen Newport 04/29/20 to discuss left leg cramping that occurs with activity. Left leg testing that day showed:   Left resting ABI: 1.0 Post exercise ABI: 0.46 Segmental exam shows evidence of distal fem-pop disease.  The patient has claudication of the left leg compatible with Rutherford 3 class symptoms. Non-invasive lower extremity exam confirms left femoral-popliteal disease, potentially recurrent stenosis at the previously treated site.  The patient desires to be symptom free and has elected to proceed with endovascular options. He presents today for a left lower extremity angiogram with possible intervention.   Past Medical History:  Diagnosis Date  . Benign prostatic hyperplasia with urinary obstruction 03/25/2015  . Diabetes mellitus without complication ([place])   . Hyperlipidemia   . Kidney stone   . OSA (obstructive sleep apnea)   . Vitamin D deficiency     Past Surgical History:  Procedure Laterality Date  . DG ARTHRO THUMB*L*    . IR ANGIOGRAM EXTREMITY BILATERAL  02/20/2018  . IR ANGIOGRAM EXTREMITY BILATERAL  04/05/2019  . IR ANGIOGRAM SELECTIVE EACH ADDITIONAL VESSEL  02/20/2018  . IR FEM POP ART ATHERECT INC PTA MOD SED  02/20/2018  . IR FEM POP ART ATHERECT INC PTA MOD SED  05/14/2019  . IR RADIOLOGIST EVAL & MGMT  02/01/2018  . IR RADIOLOGIST EVAL & MGMT  03/20/2018  . IR RADIOLOGIST EVAL & MGMT  07/25/2018  . IR RADIOLOGIST EVAL & MGMT  03/07/2019    . IR RADIOLOGIST EVAL & MGMT  04/23/2019  . IR RADIOLOGIST EVAL & MGMT  06/18/2019  . IR RADIOLOGIST EVAL & MGMT  04/29/2020  . IR US GUIDE VASC ACCESS LEFT  04/05/2019  . IR US GUIDE VASC ACCESS RIGHT  02/20/2018  . IR US GUIDE VASC ACCESS RIGHT  05/14/2019  . NECK SURGERY    . VASECTOMY      Allergies: Amoxicillin  Medications: Prior to Admission medications   Medication Sig Start Date End Date Taking? Authorizing Provider  aspirin EC 81 MG tablet Take 81 mg by mouth daily.    Yes [provider]  clopidogrel (PLAVIX) 75 MG tablet Take 75 mg by mouth daily.   Yes [provider]  fenofibrate 160 MG tablet Take 1 tablet (160 mg total) by mouth daily. 02/18/20  Yes Silverio Decamp, MD  lisinopril (ZESTRIL) 20 MG tablet TAKE 1 TABLET BY MOUTH EVERY DAY Patient taking differently: Take 20 mg by mouth daily.  11/06/19  Yes Silverio Decamp, MD  metFORMIN (GLUCOPHAGE) 1000 MG tablet TAKE 1 TABLET (1,000 MG TOTAL) BY MOUTH 2 (TWO) TIMES DAILY WITH A MEAL. 02/17/20  Yes Silverio Decamp, MD  omega-3 acid ethyl esters (LOVAZA) 1 g capsule Take 2 capsules (2 g total) by mouth 2 (two) times daily. 02/18/20  Yes Silverio Decamp, MD  oxybutynin (DITROPAN-XL) 10 MG 24 hr tablet Take 10 mg by mouth daily.   Yes [provider]  simvastatin (ZOCOR) 40 MG tablet Take 1 tablet (40  mg total) by mouth daily at 6 PM. 04/20/20  Yes Silverio Decamp, MD  sitaGLIPtin (JANUVIA) 50 MG tablet Take 1 tablet (50 mg total) by mouth at bedtime. 02/13/20  Yes Silverio Decamp, MD  tamsulosin (FLOMAX) 0.4 MG CAPS capsule Take 1 capsule (0.4 mg total) by mouth 2 (two) times daily. 05/06/20  Yes Silverio Decamp, MD  fluticasone (FLONASE) 50 MCG/ACT nasal spray Place 1 spray into both nostrils daily as needed for allergies or rhinitis.    [provider]  glucose blood test strip Use as instructed 09/02/19   Silverio Decamp, MD  Lancets [facility]  ULTRASOFT) lancets USE AS INSTRUCTED 03/02/20   Silverio Decamp, MD     Family History  Problem Relation Age of Onset  . Stroke Mother   . Diabetes Mellitus II Mother   . Prostate cancer Neg Hx   . Kidney cancer Neg Hx   . Bladder Cancer Neg Hx     Social History   Socioeconomic History  . Marital status: Married    Spouse name: Not on file  . Number of children: Not on file  . Years of education: Not on file  . Highest education level: Not on file  Occupational History  . Not on file  Tobacco Use  . Smoking status: Never Smoker  . Smokeless tobacco: Never Used  Substance and Sexual Activity  . Alcohol use: No  . Drug use: No  . Sexual activity: Yes    Birth control/protection: None  Other Topics Concern  . Not on file  Social History Narrative  . Not on file   Social Determinants of Health   Financial Resource Strain:   . Difficulty of Paying Living Expenses: Not on file  Food Insecurity:   . Worried About Charity fundraiser in the Last Year: Not on file  . Ran Out of Food in the Last Year: Not on file  Transportation Needs:   . Lack of Transportation (Medical): Not on file  . Lack of Transportation (Non-Medical): Not on file  Physical Activity:   . Days of Exercise per Week: Not on file  . Minutes of Exercise per Session: Not on file  Stress:   . Feeling of Stress : Not on file  Social Connections:   . Frequency of Communication with Friends and Family: Not on file  . Frequency of Social Gatherings with Friends and Family: Not on file  . Attends Religious Services: Not on file  . Active Member of Clubs or Organizations: Not on file  . Attends Archivist Meetings: Not on file  . Marital Status: Not on file    Review of Systems: A 12 point ROS discussed and pertinent positives are indicated in the HPI above.  All other systems are negative.  Review of Systems  Constitutional: Negative for appetite change and fatigue.  Respiratory:  Negative for cough and shortness of breath.   Cardiovascular: Negative for chest pain and leg swelling.  Gastrointestinal: Negative for abdominal pain, diarrhea, nausea and vomiting.  Musculoskeletal: Negative for back pain.  Neurological: Negative for dizziness and headaches.    Vital Signs: BP 138/84   Pulse 62   Temp 97.7 F (36.5 C) (Oral)   Ht '5\' 1"'  (1.549 m)   Wt 130 lb (59 kg)   SpO2 100%   BMI 24.56 kg/m   Physical Exam Constitutional:      General: He is not in acute distress. HENT:  Mouth/Throat:     Mouth: Mucous membranes are moist.     Pharynx: Oropharynx is clear.  Cardiovascular:     Rate and Rhythm: Normal rate and regular rhythm.     Pulses: Normal pulses.     Heart sounds: Normal heart sounds.  Pulmonary:     Effort: Pulmonary effort is normal.     Breath sounds: Normal breath sounds.  Abdominal:     General: Bowel sounds are normal.     Palpations: Abdomen is soft.  Musculoskeletal:        General: Normal range of motion.     Cervical back: Normal range of motion.  Skin:    General: Skin is warm and dry.  Neurological:     Mental Status: He is alert and oriented to person, place, and time.     Imaging: US ARTERIAL SEGMENTAL EXERCISE (USE FOR CLAUDICATION)  Result Date: 04/29/2020 CLINICAL DATA:  74 year old male with a history of claudication EXAM: NONINVASIVE PHYSIOLOGIC VASCULAR STUDY OF BILATERAL LOWER EXTREMITIES TECHNIQUE: Evaluation of both lower extremities was performed at rest, including calculation of ankle-brachial indices, multiple segmental pressure evaluation, segmental Doppler and segmental pulse volume recording. COMPARISON:  06/18/2019 FINDINGS: Right: Resting ankle brachial index:  1.08 Post exercise ABI: 1.0 Segmental blood pressure: Upper extremity pressures are symmetric. Appropriate increased to the thigh. No significant drop between segments. Digital pressure measures 161 systolic. Doppler: Segmental Doppler triphasic  throughout the femoropopliteal segment. Triphasic posterior tibial artery. Monophasic dorsalis pedis. Pulse volume recording: Segmental PVR maintained throughout the right lower extremity. Left: Resting ankle brachial index: 1.0 Post exercise ABI: 0.46 Segmental blood pressure: Upper extremity pressures are symmetric. Appropriate increased to the thigh. The low thigh is greater than 30 mm of mercury below the high thigh. No significant drop between segments of the calf to ankle. Doppler: Segmental Doppler demonstrates triphasic femoral waveform. Suspect some artifact of the popliteal waveform, which I believe is triphasic. Monophasic tibial arteries. Pulse volume recording: Segmental PVR maintained. Additional: IMPRESSION: Right: Resting and post exercise ABI within normal limits, with the segmental exam demonstrating no significant arterial occlusive disease. Left: Resting ABI within normal limits, with the post exercise ABI falling to the moderate range of arterial occlusive disease. Segmental exam demonstrates femoropopliteal disease. Signed, Dulcy Fanny. Dellia Nims, RPVI [facility] Rad[facility] Electronically Signed   By: Corrie Mckusick D.O.   On: 04/29/2020 16:11   IR Radiologist Eval & Mgmt  Result Date: 04/29/2020 Please refer to notes tab for details about interventional procedure. (Op Note)   Labs:  CBC: Recent Labs    05/14/19 0740 05/07/20 0759  WBC 6.3 6.2  HGB 15.4 13.5  HCT 46.2 42.1  PLT 328 330    COAGS: Recent Labs    05/14/19 0740 05/07/20 0759  INR 1.1 1.0    BMP: Recent Labs    05/14/19 0740  NA 138  K 4.6  CL 103  CO2 25  GLUCOSE 172*  BUN 24*  CALCIUM 9.9  CREATININE 1.42*  GFRNONAA 48*  GFRAA 56*    LIVER FUNCTION TESTS: No results for input(s): BILITOT, AST, ALT, ALKPHOS, PROT, ALBUMIN in the last 8760 hours.  TUMOR MARKERS: No results for input(s): AFPTM, CEA, CA199, CHROMGRNA in the last 8760  hours.  Assessment and Plan:  Left leg claudication; femoral-popliteal disease/potentially recurrent stenosis at the previously treated site: Mansel C. Booton, 74 year old male, presents today to the [facility] department for a left lower extremity angiogram  with possible intervention.  Risks and benefits of this procedure were discussed with the patient including, but not limited to bleeding, infection, vascular injury or contrast induced renal failure.  This interventional procedure involves the use of X-rays and because of the nature of the planned procedure, it is possible that we will have prolonged use of X-ray fluoroscopy.  Potential radiation risks to you include (but are not limited to) the following: - A slightly elevated risk for cancer  several years later in life. This  risk is typically less than 0.5% percent. This risk is low in  comparison to the normal incidence of human cancer, which is  33% for women and 50% for men according to the [place]. - Radiation induced injury can include skin redness, resembling a rash, tissue breakdown / ulcers and hair loss (which can be temporary or permanent).   The likelihood of either of these occurring depends on the difficulty of the procedure and whether you are sensitive to radiation due to previous procedures, disease, or genetic conditions.   IF your procedure requires a prolonged use of radiation, you will be notified and given written instructions for further action.  It is your responsibility to monitor the irradiated area for the 2 weeks following the procedure and to notify your physician if you are concerned that you have suffered a radiation induced injury.    All of the patient's questions were answered, patient is agreeable to proceed.  The patient has been NPO. He started taking 75 mg Plavix yesterday and he did take it this morning. Labs and vitals have been reviewed.   Consent  signed and in chart.  Thank you for this interesting consult.  I greatly enjoyed meeting Michael Thomas and look forward to participating in their care.  A copy of this report was sent to the requesting provider on this date.  Electronically Signed: Soyla Dryer, AGACNP-BC [phone] 05/07/2020, 9:00 AM   I spent a total of  30 Minutes   in face to face in clinical consultation, greater than 50% of which was counseling/coordinating care for left lower extremity angiogram with possible intervention.

## 2020-05-07 NOTE — Procedures (Signed)
Interventional Radiology Procedure Note  Procedure:   US guided right CFA access Left lower extremity angiogram Atherectomy right SFA stenosis, Laser/[place] Balloon angioplasty with 26m x 850mdrug eluting balloon  .  Complications: None  Recommendations:  - Right hip straight x 4 hrs - advance diet - dc home in 4 hrs when goals met - Do not submerge for 7 days - Routine wound care - follow up with Dr. WaEarleen Newportn 4-6 weeks with new resting and post-exercise ABI/segmental - start 7519mlavix PO daily.  He has his Rx at this time   Signed,  JaiDulcy FannyagEarleen NewportO

## 2020-05-07 NOTE — Progress Notes (Signed)
Discharge instructions reviewed with patient and family. Verbalized understanding. 

## 2020-05-07 NOTE — Discharge Instructions (Addendum)
Angiogram, Care After This sheet gives you information about how to care for yourself after your procedure. Your doctor may also give you more specific instructions. If you have problems or questions, contact your doctor. Follow these instructions at home: Insertion site care  Follow instructions from your doctor about how to take care of your long, thin tube (catheter) insertion area. Make sure you: ? Wash your hands with soap and water before you change your bandage (dressing). If you cannot use soap and water, use hand sanitizer. ? Change your bandage as told by your doctor. ? Leave stitches (sutures), skin glue, or skin tape (adhesive) strips in place. They may need to stay in place for 2 weeks or longer. If tape strips get loose and curl up, you may trim the loose edges. Do not remove tape strips completely unless your doctor says it is okay.  Do not take baths, swim, or use a hot tub until your doctor says it is okay.  You may shower 24-48 hours after the procedure or as told by your doctor. ? Gently wash the area with plain soap and water. ? Pat the area dry with a clean towel. ? Do not rub the area. This may cause bleeding.  Do not apply powder or lotion to the area. Keep the area clean and dry.  Check your insertion area every day for signs of infection. Check for: ? More redness, swelling, or pain. ? Fluid or blood. ? Warmth. ? Pus or a bad smell. Activity  Rest as told by your doctor, usually for 1-2 days.  Do not lift anything that is heavier than 10 lbs. (4.5 kg) or as told by your doctor.  Do not drive for 24 hours if you were given a medicine to help you relax (sedative).  Do not drive or use heavy machinery while taking prescription pain medicine. General instructions   Go back to your normal activities as told by your doctor, usually in about a week. Ask your doctor what activities are safe for you.  If the insertion area starts to bleed, lie flat and put  pressure on the area. If the bleeding does not stop, get help right away. This is an emergency.  Drink enough fluid to keep your pee (urine) clear or pale yellow.  Take over-the-counter and prescription medicines only as told by your doctor.  Keep all follow-up visits as told by your doctor. This is important. Contact a doctor if:  You have a fever.  You have chills.  You have more redness, swelling, or pain around your insertion area.  You have fluid or blood coming from your insertion area.  The insertion area feels warm to the touch.  You have pus or a bad smell coming from your insertion area.  You have more bruising around the insertion area.  Blood collects in the tissue around the insertion area (hematoma) that may be painful to the touch. Get help right away if:  You have a lot of pain in the insertion area.  The insertion area swells very fast.  The insertion area is bleeding, and the bleeding does not stop after holding steady pressure on the area.  The area near or just beyond the insertion area becomes pale, cool, tingly, or numb. These symptoms may be an emergency. Do not wait to see if the symptoms will go away. Get medical help right away. Call your local emergency services (911 in the [place] Do not drive yourself to the hospital.   Summary  After the procedure, it is common to have bruising and tenderness at the long, thin tube insertion area.  After the procedure, it is important to rest and drink plenty of fluids.  Do not take baths, swim, or use a hot tub until your doctor says it is okay to do so. You may shower 24-48 hours after the procedure or as told by your doctor.  If the insertion area starts to bleed, lie flat and put pressure on the area. If the bleeding does not stop, get help right away. This is an emergency. This information is not intended to replace advice given to you by your health care provider. Make sure you discuss any questions you have  with your health care provider. Document Revised: 07/07/2017 Document Reviewed: 07/19/2016 Elsevier Patient Education  [place]. Moderate Conscious Sedation, Adult Sedation is the use of medicines to promote relaxation and relieve discomfort and anxiety. Moderate conscious sedation is a type of sedation. Under moderate conscious sedation, you are less alert than normal, but you are still able to respond to instructions, touch, or both. Moderate conscious sedation is used during short medical and dental procedures. It is milder than deep sedation, which is a type of sedation under which you cannot be easily woken up. It is also milder than general anesthesia, which is the use of medicines to make you unconscious. Moderate conscious sedation allows you to return to your regular activities sooner. Tell a health care provider about:  Any allergies you have.  All medicines you are taking, including vitamins, herbs, eye drops, creams, and over-the-counter medicines.  Use of steroids (by mouth or creams).  Any problems you or family members have had with sedatives and anesthetic medicines.  Any blood disorders you have.  Any surgeries you have had.  Any medical conditions you have, such as sleep apnea.  Whether you are pregnant or may be pregnant.  Any use of cigarettes, alcohol, marijuana, or street drugs. What are the risks? Generally, this is a safe procedure. However, problems may occur, including:  Getting too much medicine (oversedation).  Nausea.  Allergic reaction to medicines.  Trouble breathing. If this happens, a breathing tube may be used to help with breathing. It will be removed when you are awake and breathing on your own.  Heart trouble.  Lung trouble. What happens before the procedure? Staying hydrated Follow instructions from your health care provider about hydration, which may include:  Up to 2 hours before the procedure - you may continue to drink  clear liquids, such as water, clear fruit juice, black coffee, and plain tea. Eating and drinking restrictions Follow instructions from your health care provider about eating and drinking, which may include:  8 hours before the procedure - stop eating heavy meals or foods such as meat, fried foods, or fatty foods.  6 hours before the procedure - stop eating light meals or foods, such as toast or cereal.  6 hours before the procedure - stop drinking milk or drinks that contain milk.  2 hours before the procedure - stop drinking clear liquids. Medicine Ask your health care provider about:  Changing or stopping your regular medicines. This is especially important if you are taking diabetes medicines or blood thinners.  Taking medicines such as aspirin and ibuprofen. These medicines can thin your blood. Do not take these medicines before your procedure if your health care provider instructs you not to.  Tests and exams  You will have a physical  exam.  You may have blood tests done to show: ? How well your kidneys and liver are working. ? How well your blood can clot. General instructions  Plan to have someone take you home from the hospital or clinic.  If you will be going home right after the procedure, plan to have someone with you for 24 hours. What happens during the procedure?  An IV tube will be inserted into one of your veins.  Medicine to help you relax (sedative) will be given through the IV tube.  The medical or dental procedure will be performed. What happens after the procedure?  Your blood pressure, heart rate, breathing rate, and blood oxygen level will be monitored often until the medicines you were given have worn off.  Do not drive for 24 hours. This information is not intended to replace advice given to you by your health care provider. Make sure you discuss any questions you have with your health care provider. Document Revised: 07/07/2017 Document Reviewed:  11/14/2015 Elsevier Patient Education  2020 [employer].

## 2020-05-08 ENCOUNTER — Other Ambulatory Visit: Payer: Self-pay | Admitting: Sports Medicine

## 2020-05-08 DIAGNOSIS — I10 Essential (primary) hypertension: Secondary | ICD-10-CM

## 2020-05-11 ENCOUNTER — Encounter: Payer: Self-pay | Admitting: *Deleted

## 2020-05-11 ENCOUNTER — Other Ambulatory Visit: Payer: Self-pay | Admitting: Interventional Radiology

## 2020-05-11 DIAGNOSIS — I739 Peripheral vascular disease, unspecified: Secondary | ICD-10-CM

## 2020-05-12 ENCOUNTER — Telehealth: Payer: Self-pay

## 2020-05-12 DIAGNOSIS — G4733 Obstructive sleep apnea (adult) (pediatric): Secondary | ICD-10-CM

## 2020-05-12 NOTE — Telephone Encounter (Signed)
Patient called stating that he has an appt with Dr. Darene Lamer on 05/15/2020 and wanted to follow up on a previous conversation about CPAP.  He stated that they spoke about this previously but it never got follow up on.  He would like to have some information available on 05/15/2020 when he comes for his appointment.

## 2020-05-12 NOTE — Telephone Encounter (Signed)
Is he looking for a new CPAP device or a new sleep study?  If he lets me know specifically what he needs I can go ahead and start the ball rolling now.

## 2020-05-13 NOTE — Telephone Encounter (Signed)
Sleep study ordered, he should get a phone call to do the home sleep study.

## 2020-05-13 NOTE — Telephone Encounter (Signed)
Spoke with patient, he states he needs a new CPAP machine.  He got his in 2013.  He will need a new sleep study.

## 2020-05-13 NOTE — Assessment & Plan Note (Signed)
Needs a new CPAP machine and sleep study, this is ordered today. Everything positive on the Epworth Sleepiness Scale.

## 2020-05-15 ENCOUNTER — Encounter: Payer: Self-pay | Admitting: Sports Medicine

## 2020-05-15 ENCOUNTER — Ambulatory Visit (INDEPENDENT_AMBULATORY_CARE_PROVIDER_SITE_OTHER): Payer: PPO | Admitting: Sports Medicine

## 2020-05-15 ENCOUNTER — Other Ambulatory Visit: Payer: Self-pay

## 2020-05-15 VITALS — BP 107/66 | HR 85 | Ht 61.0 in | Wt 130.0 lb

## 2020-05-15 DIAGNOSIS — E118 Type 2 diabetes mellitus with unspecified complications: Secondary | ICD-10-CM

## 2020-05-15 LAB — POCT GLYCOSYLATED HEMOGLOBIN (HGB A1C): Hemoglobin A1C: 7.7 % — AB (ref 4.0–5.6)

## 2020-05-15 MED ORDER — AMBULATORY NON FORMULARY MEDICATION: 0 refills | Status: DC

## 2020-05-15 MED ORDER — AMBULATORY NON FORMULARY MEDICATION: 0 refills | Status: AC

## 2020-05-15 MED ORDER — GLIPIZIDE ER 5 MG PO TB24: 5.0000 mg | ORAL_TABLET | Freq: Every day | ORAL | 3 refills | Status: DC

## 2020-05-15 NOTE — Assessment & Plan Note (Signed)
Michael Thomas returns, he is a pleasant 74 year old male with diabetes type 2. At the last visit his A1c was 7% but he was having episodes of hypoglycemia into the 50s and 60s, symptomatic. We discontinued his glipizide, he returns today, A1c is not surprisingly worse, 7.7%. We are going to switch to glipizide XL 5 mg once daily. Return to see me in 3 months for repeat A1c. He does need a new meter as well.

## 2020-05-15 NOTE — Progress Notes (Signed)
    Procedures performed today:    None.  Independent interpretation of notes and tests performed by another provider:   None.  Brief History, Exam, Impression, and Recommendations:    Type 2 diabetes mellitus with complication, without long-term current use of insulin (HCC) Michael Thomas returns, he is a pleasant 74 year old male with diabetes type 2. At the last visit his A1c was 7% but he was having episodes of hypoglycemia into the 50s and 60s, symptomatic. We discontinued his glipizide, he returns today, A1c is not surprisingly worse, 7.7%. We are going to switch to glipizide XL 5 mg once daily. Return to see me in 3 months for repeat A1c. He does need a new meter as well.    ___________________________________________ Gwen Her. Dianah Field, M.D., ABFM., CAQSM. Primary Care and [place] Instructor of [employer] [facility] of Medicine

## 2020-05-18 ENCOUNTER — Other Ambulatory Visit: Payer: Self-pay

## 2020-05-18 DIAGNOSIS — E118 Type 2 diabetes mellitus with unspecified complications: Secondary | ICD-10-CM

## 2020-05-18 MED ORDER — ONETOUCH ULTRA 2 W/DEVICE KIT: PACK | 99 refills | Status: DC

## 2020-05-28 ENCOUNTER — Telehealth: Payer: Self-pay

## 2020-05-28 DIAGNOSIS — G4733 Obstructive sleep apnea (adult) (pediatric): Secondary | ICD-10-CM

## 2020-05-28 NOTE — Telephone Encounter (Signed)
Patient called stating that the sleep study previously ordered is not covered by insurance.   Requesting sleep study test be ordered for in-lab at [facility] in [place].   Asking if this can be expedited as he will have a much lower co-pay if done in 2021

## 2020-05-29 NOTE — Telephone Encounter (Signed)
No problem, orders placed.

## 2020-06-01 ENCOUNTER — Other Ambulatory Visit: Payer: Self-pay | Admitting: Sports Medicine

## 2020-06-01 ENCOUNTER — Ambulatory Visit
Admission: RE | Admit: 2020-06-01 | Discharge: 2020-06-01 | Disposition: A | Discharge status: Home / Self Care | Payer: PPO | Source: Ambulatory Visit | Attending: Interventional Radiology | Admitting: Interventional Radiology

## 2020-06-01 DIAGNOSIS — I70213 Atherosclerosis of native arteries of extremities with intermittent claudication, bilateral legs: Secondary | ICD-10-CM | POA: Diagnosis not present

## 2020-06-01 DIAGNOSIS — N138 Other obstructive and reflux uropathy: Secondary | ICD-10-CM

## 2020-06-01 DIAGNOSIS — I739 Peripheral vascular disease, unspecified: Secondary | ICD-10-CM

## 2020-06-04 ENCOUNTER — Encounter: Payer: Self-pay | Admitting: *Deleted

## 2020-06-04 ENCOUNTER — Ambulatory Visit
Admission: RE | Admit: 2020-06-04 | Discharge: 2020-06-04 | Disposition: A | Discharge status: Home / Self Care | Payer: PPO | Source: Ambulatory Visit | Attending: Interventional Radiology | Admitting: Interventional Radiology

## 2020-06-04 ENCOUNTER — Other Ambulatory Visit: Payer: PPO

## 2020-06-04 DIAGNOSIS — I739 Peripheral vascular disease, unspecified: Secondary | ICD-10-CM | POA: Diagnosis not present

## 2020-06-04 HISTORY — PX: IR RADIOLOGIST EVAL & MGMT: IMG5224

## 2020-06-04 NOTE — Progress Notes (Signed)
Chief Complaint: PAD  Referring Physician(s): Dr. Dianah Field  History of Present Illness: Michael Thomas a 74 y.o.malepresenting today for a post-operative visit, SP re-treatment of a recurrent symptomatic high-grade left SFA lesion, contributing to life-style limiting short distance claudication.  Michael Thomas joins [place] today in the clinic by himself.   Recently we treated his left SFA recurrent stenosis, 05/07/20, with laser atherectomy and drug-eluting balloon angioplasty.   Baseline/preop ABI 04/29/20: Right resting ABI: 1.08 Post exercise ABI: 1.0 Left resting ABI: 1.0 Post exercise ABI: 0.46  Post-treatment ABI 06/01/20: Right resting ABI: 1.08 Post exercise: 1.06 Left resting ABI: 1.08 Post exercise: 1.03  Today he reports that he has complete resolution of symptom of the left leg.  He has not yet gone back to the gym to work out, but he does tell me that he mowed the yard recently without symptoms.   He has no complaints regarding the access site.   He is currently on DAPT, with 10m ASA and 759mplavix.      Past Medical History:  Diagnosis Date  . Benign prostatic hyperplasia with urinary obstruction 03/25/2015  . Diabetes mellitus without complication ([place]  . Hyperlipidemia   . Kidney stone   . OSA (obstructive sleep apnea)   . Vitamin D deficiency     Past Surgical History:  Procedure Laterality Date  . DG ARTHRO THUMB*L*    . IR ANGIOGRAM EXTREMITY BILATERAL  02/20/2018  . IR ANGIOGRAM EXTREMITY BILATERAL  04/05/2019  . IR ANGIOGRAM EXTREMITY LEFT  05/07/2020  . IR ANGIOGRAM SELECTIVE EACH ADDITIONAL VESSEL  02/20/2018  . IR FEM POP ART ATHERECT INC PTA MOD SED  02/20/2018  . IR FEM POP ART ATHERECT INC PTA MOD SED  05/14/2019  . IR FEM POP ART ATHERECT INC PTA MOD SED  05/07/2020  . IR RADIOLOGIST EVAL & MGMT  02/01/2018  . IR RADIOLOGIST EVAL & MGMT  03/20/2018  . IR RADIOLOGIST EVAL & MGMT  07/25/2018  . IR RADIOLOGIST EVAL & MGMT   03/07/2019  . IR RADIOLOGIST EVAL & MGMT  04/23/2019  . IR RADIOLOGIST EVAL & MGMT  06/18/2019  . IR RADIOLOGIST EVAL & MGMT  04/29/2020  . IR RADIOLOGIST EVAL & MGMT  06/04/2020  . IR [place] VASC ACCESS LEFT  04/05/2019  . IR [place] VASC ACCESS RIGHT  02/20/2018  . IR [place] VASC ACCESS RIGHT  05/14/2019  . IR [place] VASC ACCESS RIGHT  05/07/2020  . NECK SURGERY    . VASECTOMY      Allergies: Amoxicillin  Medications: Prior to Admission medications   Medication Sig Start Date End Date Taking? Authorizing Provider  AMBULATORY NON FORMULARY MEDICATION One Touch Ultra 2 glucometer 05/15/20   ThSilverio DecampMD  aspirin EC 81 MG tablet Take 81 mg by mouth daily.     [provider]  Blood Glucose Monitoring Suppl (ONE TOUCH ULTRA 2) w/Device KIT DM E11.8. Check fasting blood sugar every morning and 2 hours after largest meal of the day. 05/18/20   ThSilverio DecampMD  clopidogrel (PLAVIX) 75 MG tablet Take 75 mg by mouth daily.    [provider]  fenofibrate 160 MG tablet Take 1 tablet (160 mg total) by mouth daily. 02/18/20   ThSilverio DecampMD  glipiZIDE (GLUCOTROL XL) 5 MG 24 hr tablet Take 1 tablet (5 mg total) by mouth daily with breakfast. 05/15/20   ThSilverio DecampMD  glucose  blood test strip Use as instructed 09/02/19   Silverio Decamp, MD  Lancets [facility] ULTRASOFT) lancets USE AS INSTRUCTED 03/02/20   Silverio Decamp, MD  lisinopril (ZESTRIL) 20 MG tablet TAKE 1 TABLET BY MOUTH EVERY DAY 05/08/20   Silverio Decamp, MD  metFORMIN (GLUCOPHAGE) 1000 MG tablet TAKE 1 TABLET (1,000 MG TOTAL) BY MOUTH 2 (TWO) TIMES DAILY WITH A MEAL. 02/17/20   Silverio Decamp, MD  omega-3 acid ethyl esters (LOVAZA) 1 g capsule Take 2 capsules (2 g total) by mouth 2 (two) times daily. 02/18/20   Silverio Decamp, MD  oxybutynin (DITROPAN-XL) 10 MG 24 hr tablet Take 10 mg by mouth daily.    [provider]    simvastatin (ZOCOR) 40 MG tablet Take 1 tablet (40 mg total) by mouth daily at 6 PM. 04/20/20   Silverio Decamp, MD  sitaGLIPtin (JANUVIA) 50 MG tablet Take 1 tablet (50 mg total) by mouth at bedtime. 02/13/20   Silverio Decamp, MD  tamsulosin (FLOMAX) 0.4 MG CAPS capsule TAKE 1 CAPSULE BY MOUTH 2 TIMES DAILY. 06/01/20   Silverio Decamp, MD     Family History  Problem Relation Age of Onset  . Stroke Mother   . Diabetes Mellitus II Mother   . Prostate cancer Neg Hx   . Kidney cancer Neg Hx   . Bladder Cancer Neg Hx     Social History   Socioeconomic History  . Marital status: Married    Spouse name: Not on file  . Number of children: Not on file  . Years of education: Not on file  . Highest education level: Not on file  Occupational History  . Not on file  Tobacco Use  . Smoking status: Never Smoker  . Smokeless tobacco: Never Used  Substance and Sexual Activity  . Alcohol use: No  . Drug use: No  . Sexual activity: Yes    Birth control/protection: None  Other Topics Concern  . Not on file  Social History Narrative  . Not on file   Social Determinants of Health   Financial Resource Strain:   . Difficulty of Paying Living Expenses: Not on file  Food Insecurity:   . Worried About Charity fundraiser in the Last Year: Not on file  . Ran Out of Food in the Last Year: Not on file  Transportation Needs:   . Lack of Transportation (Medical): Not on file  . Lack of Transportation (Non-Medical): Not on file  Physical Activity:   . Days of Exercise per Week: Not on file  . Minutes of Exercise per Session: Not on file  Stress:   . Feeling of Stress : Not on file  Social Connections:   . Frequency of Communication with Friends and Family: Not on file  . Frequency of Social Gatherings with Friends and Family: Not on file  . Attends Religious Services: Not on file  . Active Member of Clubs or Organizations: Not on file  . Attends Archivist  Meetings: Not on file  . Marital Status: Not on file      Review of Systems: A 12 point ROS discussed and pertinent positives are indicated in the HPI above.  All other systems are negative.  Review of Systems  Vital Signs: There were no vitals taken for this visit.  Physical Exam  Right hip shows no evidence of swelling, with healed site of access, and no erythema/redness. Targeted exam reveals strong doppler signals  at the [facility] and PT.   Mallampati Score:     Imaging: IR Angiogram Extremity Left  Result Date: 05/07/2020 INDICATION: 74 year old male with short distance lifestyle limiting claudication, presents for treatment of the left leg EXAM: ULTRASOUND-GUIDED ACCESS RIGHT COMMON FEMORAL ARTERY ANGIOGRAM LEFT LOWER EXTREMITY ATHERECTOMY LEFT SFA BALLOON ANGIOPLASTY WITH DRUG-ELUTING BALLOON ANGIO-SEAL FOR HEMOSTASIS MEDICATIONS: 7000 units IV heparin. ANESTHESIA/SEDATION: Moderate (conscious) sedation was employed during this procedure. A total of Versed 1.5 mg and Fentanyl 75 mcg was administered intravenously. Moderate Sedation Time: 60 minutes. The patient's level of consciousness and vital signs were monitored continuously by radiology nursing throughout the procedure under my direct supervision. CONTRAST:  5m OMNIPAQUE IOHEXOL 300 MG/ML  SOLN FLUOROSCOPY TIME:  Fluoroscopy Time: 10 minutes 6 seconds (41 mGy). COMPLICATIONS: None PROCEDURE: Informed consent was obtained from the patient following explanation of the procedure, risks, benefits and alternatives. The patient understands, agrees and consents for the procedure. All questions were addressed. A time out was performed prior to the initiation of the procedure. Maximal barrier sterile technique utilized including caps, mask, sterile gowns, sterile gloves, large sterile drape, hand hygiene, and Betadine prep. Ultrasound survey of the right inguinal region was performed with images stored and sent to PACs, confirming patency of the  vessel. 1% lidocaine was used for local anesthesia. Small stab incision was made with 11 blade scalpel. Blunt dissection was performed. A micropuncture needle was used access the right common femoral artery under ultrasound. With excellent arterial blood flow returned, and an .018 micro wire was passed through the needle, observed enter the abdominal aorta under fluoroscopy. The needle was removed, and a micropuncture sheath was placed over the wire. The inner dilator and wire were removed, and an 035 Bentson wire was advanced under fluoroscopy into the abdominal aorta. The sheath was removed and a standard 5 [place] sheath was placed. The dilator was removed and the sheath was flushed. Omni Flush catheter was advanced into the abdominal aorta. Bentson wire in the Omni Flush catheter were used to navigate the wire into the left iliac system. Omni Flush catheter was removed and a Navicross catheter was advanced to the distal external iliac artery. Angiogram performed of the left lower extremity. Systemic heparinization was then performed. Glidewire was used to advance the crossing catheter into the proximal SFA. The catheter was then removed on an exchange length 035 wire and a 45 cm 6 French straight tip destination sheath was placed over the bifurcation into the proximal SFA. The crossing catheter was then advanced with a Glidewire through the stenotic lesion in the SFA. Glidewire was removed and a 300 cm Spartacore wire was placed into the popliteal artery. Catheter was removed. Laser atherectomy was then performed with the Angio Dynamics Auyron 2.0 system, 2 minutes 18 seconds of laser time with total blood loss of 150 cc. The device was removed from the wire angiogram was performed. Balloon angioplasty of the segment was then performed with a 5 mm x 80 mm impact drug-eluting balloon for 3 minutes. Repeat angiogram was performed through the foot. Palpable pulses were then confirmed at the posterior tibial  and anterior tibial pulse segments. All wires and catheters were removed. Angio-Seal was deployed for hemostasis. Patient tolerated the procedure well and remained hemodynamically stable throughout. No complications were encountered and no significant blood loss. FINDINGS: Ultrasound demonstrates patent right common femoral artery Angiogram of the left lower extremity demonstrates moderate atherosclerotic changes of the left SFA, with high-grade stenosis greater than 80% in  the region of the adductor canal, corresponding to the hemodynamic significant lesion identified on the previous ABI/segmental study. TASC B lesion of the right SFA, heavily calcified <10cm After treatment with atherectomy and balloon angioplasty, there is less than 20% residual stenosis with excellent flow restored through the segment and palpable pulses. IMPRESSION: Status post ultrasound guided access right common femoral artery for left lower extremity angiogram, laser atherectomy and drug-eluting balloon angioplasty of left SFA lesion. Angio-Seal for hemostasis. Signed, Dulcy Fanny. Dellia Nims, RPVI [facility] Inter[facility] Radiology [facility] Radiology Electronically Signed   By: Corrie Mckusick D.O.   On: 05/07/2020 13:00   US ARTERIAL SEGMENTAL EXERCISE (USE FOR CLAUDICATION)  Result Date: 06/01/2020 CLINICAL DATA:  74 year old male with history of PA D, recently treated for recurrent short distance claudication ([place] 3) symptoms of the left lower extremity EXAM: NONINVASIVE PHYSIOLOGIC VASCULAR STUDY OF BILATERAL LOWER EXTREMITIES TECHNIQUE: Non-invasive vascular evaluation of both lower extremities was performed at rest, including calculation of ankle-brachial indices, multiple segmental pressure evaluation, segmental Doppler and segmental pulse volume recording. COMPARISON:  05/07/2020, 04/29/2020 FINDINGS: Right: Resting ankle brachial index:  1.08 Post exercise ABI: 1.06 Segmental blood pressure: Segmental  pressures of the upper extremity are symmetric. Appropriate increased to the thigh. No significant drop between segments. Digital pressure measures 161 systolic Doppler: Segmental Doppler demonstrates triphasic waveforms throughout the right lower extremity Pulse volume recording: Segmental PVR maintained throughout the right lower extremity Left: Resting ankle brachial index: 1.08 Post exercise ABI: 1.03 Segmental blood pressure: Upper extremity pressures are symmetric. Appropriate increased to the thigh. No significant drop between segments. Digital pressure measures 096 systolic Doppler: Segmental Doppler demonstrates triphasic waveforms maintained. Pulse volume recording: Segmental PVR maintained Additional: IMPRESSION: Right: Resting and post exercise ABI within normal limits with the segmental exam demonstrating no significant arterial occlusive disease. Left: Resting ABI within normal limits, with post treatment normalization of the post exercise ABI, also within normal limits. Segmental exam demonstrates no significant arterial occlusive disease. Signed, Dulcy Fanny. Dellia Nims, RPVI Vascular and Interventional Radiology Specialists [facility] Radiology Electronically Signed   By: Corrie Mckusick D.O.   On: 06/01/2020 12:15   IR FEM POP ART ATHERECT INC PTA MOD SED  Result Date: 05/07/2020 INDICATION: 74 year old male with short distance lifestyle limiting claudication, presents for treatment of the left leg EXAM: ULTRASOUND-GUIDED ACCESS RIGHT COMMON FEMORAL ARTERY ANGIOGRAM LEFT LOWER EXTREMITY ATHERECTOMY LEFT SFA BALLOON ANGIOPLASTY WITH DRUG-ELUTING BALLOON ANGIO-SEAL FOR HEMOSTASIS MEDICATIONS: 7000 units IV heparin. ANESTHESIA/SEDATION: Moderate (conscious) sedation was employed during this procedure. A total of Versed 1.5 mg and Fentanyl 75 mcg was administered intravenously. Moderate Sedation Time: 60 minutes. The patient's level of consciousness and vital signs were monitored continuously by  radiology nursing throughout the procedure under my direct supervision. CONTRAST:  72m OMNIPAQUE IOHEXOL 300 MG/ML  SOLN FLUOROSCOPY TIME:  Fluoroscopy Time: 10 minutes 6 seconds (41 mGy). COMPLICATIONS: None PROCEDURE: Informed consent was obtained from the patient following explanation of the procedure, risks, benefits and alternatives. The patient understands, agrees and consents for the procedure. All questions were addressed. A time out was performed prior to the initiation of the procedure. Maximal barrier sterile technique utilized including caps, mask, sterile gowns, sterile gloves, large sterile drape, hand hygiene, and Betadine prep. Ultrasound survey of the right inguinal region was performed with images stored and sent to PACs, confirming patency of the vessel. 1% lidocaine was used for local anesthesia. Small stab incision was made with 11 blade scalpel. Blunt dissection was performed.  A micropuncture needle was used access the right common femoral artery under ultrasound. With excellent arterial blood flow returned, and an .018 micro wire was passed through the needle, observed enter the abdominal aorta under fluoroscopy. The needle was removed, and a micropuncture sheath was placed over the wire. The inner dilator and wire were removed, and an 035 Bentson wire was advanced under fluoroscopy into the abdominal aorta. The sheath was removed and a standard 5 [place] vascular sheath was placed. The dilator was removed and the sheath was flushed. Omni Flush catheter was advanced into the abdominal aorta. Bentson wire in the Omni Flush catheter were used to navigate the wire into the left iliac system. Omni Flush catheter was removed and a Navicross catheter was advanced to the distal external iliac artery. Angiogram performed of the left lower extremity. Systemic heparinization was then performed. Glidewire was used to advance the crossing catheter into the proximal SFA. The catheter was then removed on an  exchange length 035 wire and a 45 cm 6 French straight tip destination sheath was placed over the bifurcation into the proximal SFA. The crossing catheter was then advanced with a Glidewire through the stenotic lesion in the SFA. Glidewire was removed and a 300 cm Spartacore wire was placed into the popliteal artery. Catheter was removed. Laser atherectomy was then performed with the Angio Dynamics Auyron 2.0 system, 2 minutes 18 seconds of laser time with total blood loss of 150 cc. The device was removed from the wire angiogram was performed. Balloon angioplasty of the segment was then performed with a 5 mm x 80 mm impact drug-eluting balloon for 3 minutes. Repeat angiogram was performed through the foot. Palpable pulses were then confirmed at the posterior tibial and anterior tibial pulse segments. All wires and catheters were removed. Angio-Seal was deployed for hemostasis. Patient tolerated the procedure well and remained hemodynamically stable throughout. No complications were encountered and no significant blood loss. FINDINGS: Ultrasound demonstrates patent right common femoral artery Angiogram of the left lower extremity demonstrates moderate atherosclerotic changes of the left SFA, with high-grade stenosis greater than 80% in the region of the adductor canal, corresponding to the hemodynamic significant lesion identified on the previous ABI/segmental study. TASC B lesion of the right SFA, heavily calcified <10cm After treatment with atherectomy and balloon angioplasty, there is less than 20% residual stenosis with excellent flow restored through the segment and palpable pulses. IMPRESSION: Status post ultrasound guided access right common femoral artery for left lower extremity angiogram, laser atherectomy and drug-eluting balloon angioplasty of left SFA lesion. Angio-Seal for hemostasis. Signed, Dulcy Fanny. Dellia Nims, RPVI Vascular and Interventional Radiology Specialists [facility] Radiology Electronically  Signed   By: Corrie Mckusick D.O.   On: 05/07/2020 13:00   IR US Guide Vasc Access Right  Result Date: 05/07/2020 INDICATION: 74 year old male with short distance lifestyle limiting claudication, presents for treatment of the left leg EXAM: ULTRASOUND-GUIDED ACCESS RIGHT COMMON FEMORAL ARTERY ANGIOGRAM LEFT LOWER EXTREMITY ATHERECTOMY LEFT SFA BALLOON ANGIOPLASTY WITH DRUG-ELUTING BALLOON ANGIO-SEAL FOR HEMOSTASIS MEDICATIONS: 7000 units IV heparin. ANESTHESIA/SEDATION: Moderate (conscious) sedation was employed during this procedure. A total of Versed 1.5 mg and Fentanyl 75 mcg was administered intravenously. Moderate Sedation Time: 60 minutes. The patient's level of consciousness and vital signs were monitored continuously by radiology nursing throughout the procedure under my direct supervision. CONTRAST:  42m OMNIPAQUE IOHEXOL 300 MG/ML  SOLN FLUOROSCOPY TIME:  Fluoroscopy Time: 10 minutes 6 seconds (41 mGy). COMPLICATIONS: None PROCEDURE: Informed consent was obtained from  the patient following explanation of the procedure, risks, benefits and alternatives. The patient understands, agrees and consents for the procedure. All questions were addressed. A time out was performed prior to the initiation of the procedure. Maximal barrier sterile technique utilized including caps, mask, sterile gowns, sterile gloves, large sterile drape, hand hygiene, and Betadine prep. Ultrasound survey of the right inguinal region was performed with images stored and sent to PACs, confirming patency of the vessel. 1% lidocaine was used for local anesthesia. Small stab incision was made with 11 blade scalpel. Blunt dissection was performed. A micropuncture needle was used access the right common femoral artery under ultrasound. With excellent arterial blood flow returned, and an .018 micro wire was passed through the needle, observed enter the abdominal aorta under fluoroscopy. The needle was removed, and a micropuncture sheath was  placed over the wire. The inner dilator and wire were removed, and an 035 Bentson wire was advanced under fluoroscopy into the abdominal aorta. The sheath was removed and a standard 5 [place] vascular sheath was placed. The dilator was removed and the sheath was flushed. Omni Flush catheter was advanced into the abdominal aorta. Bentson wire in the Omni Flush catheter were used to navigate the wire into the left iliac system. Omni Flush catheter was removed and a Navicross catheter was advanced to the distal external iliac artery. Angiogram performed of the left lower extremity. Systemic heparinization was then performed. Glidewire was used to advance the crossing catheter into the proximal SFA. The catheter was then removed on an exchange length 035 wire and a 45 cm 6 French straight tip destination sheath was placed over the bifurcation into the proximal SFA. The crossing catheter was then advanced with a Glidewire through the stenotic lesion in the SFA. Glidewire was removed and a 300 cm Spartacore wire was placed into the popliteal artery. Catheter was removed. Laser atherectomy was then performed with the Angio Dynamics Auyron 2.0 system, 2 minutes 18 seconds of laser time with total blood loss of 150 cc. The device was removed from the wire angiogram was performed. Balloon angioplasty of the segment was then performed with a 5 mm x 80 mm impact drug-eluting balloon for 3 minutes. Repeat angiogram was performed through the foot. Palpable pulses were then confirmed at the posterior tibial and anterior tibial pulse segments. All wires and catheters were removed. Angio-Seal was deployed for hemostasis. Patient tolerated the procedure well and remained hemodynamically stable throughout. No complications were encountered and no significant blood loss. FINDINGS: Ultrasound demonstrates patent right common femoral artery Angiogram of the left lower extremity demonstrates moderate atherosclerotic changes of the left  SFA, with high-grade stenosis greater than 80% in the region of the adductor canal, corresponding to the hemodynamic significant lesion identified on the previous ABI/segmental study. TASC B lesion of the right SFA, heavily calcified <10cm After treatment with atherectomy and balloon angioplasty, there is less than 20% residual stenosis with excellent flow restored through the segment and palpable pulses. IMPRESSION: Status post ultrasound guided access right common femoral artery for left lower extremity angiogram, laser atherectomy and drug-eluting balloon angioplasty of left SFA lesion. Angio-Seal for hemostasis. Signed, Dulcy Fanny. Dellia Nims, RPVI [facility] Interventional Radiology Specialists [facility] Radiology Electronically Signed   By: Corrie Mckusick D.O.   On: 05/07/2020 13:00   IR Radiologist Eval & Mgmt  Result Date: 06/04/2020 Please refer to notes tab for details about interventional procedure. (Op Note)   Labs:  CBC: Recent Labs    05/07/20  0759  WBC 6.2  HGB 13.5  HCT 42.1  PLT 330    COAGS: Recent Labs    05/07/20 0759  INR 1.0    BMP: Recent Labs    05/07/20 0759  NA 137  K 4.4  CL 106  CO2 20*  GLUCOSE 134*  BUN 25*  CALCIUM 9.3  CREATININE 1.35*  GFRNONAA 51*  GFRAA 59*    LIVER FUNCTION TESTS: No results for input(s): BILITOT, AST, ALT, ALKPHOS, PROT, ALBUMIN in the last 8760 hours.  TUMOR MARKERS: No results for input(s): AFPTM, CEA, CA199, CHROMGRNA in the last 8760 hours.  Assessment and Plan:  Michael Thomas is 74 yo male SP treatment of a recurrent left SFA stenosis with laser atherectomy and DEB.   He has had resolution of his symptoms and normalization of the resting/post-exercise ABI.    I let him know that he can go back to working out at the gym and other physical activity.   After the 90 days of plavix expires, he will continue only on the 87m ASA daily.  Continue maximal medical therapy.   Plan: - follow up in 1 year with  repeat ABI/segmental - 90 days of plavix and aspirin, after 90 days, ASA only.  - Maximal medical therapy     Electronically Signed: JCorrie Mckusick10/28/2021, 12:32 PM   I spent a total of    15 Minutes in face to face in clinical consultation, greater than 50% of which was counseling/coordinating care for post operative check for treatment of left SFA disease, SP laser atherectomy and drug-eluting balloon angioplasty.

## 2020-06-22 DIAGNOSIS — J301 Allergic rhinitis due to pollen: Secondary | ICD-10-CM | POA: Diagnosis not present

## 2020-06-22 DIAGNOSIS — G4733 Obstructive sleep apnea (adult) (pediatric): Secondary | ICD-10-CM | POA: Diagnosis not present

## 2020-06-22 DIAGNOSIS — J342 Deviated nasal septum: Secondary | ICD-10-CM | POA: Diagnosis not present

## 2020-08-17 ENCOUNTER — Ambulatory Visit: Payer: PPO | Admitting: Sports Medicine

## 2020-08-19 ENCOUNTER — Other Ambulatory Visit: Payer: Self-pay

## 2020-08-19 ENCOUNTER — Encounter: Payer: Self-pay | Admitting: Sports Medicine

## 2020-08-19 ENCOUNTER — Ambulatory Visit (INDEPENDENT_AMBULATORY_CARE_PROVIDER_SITE_OTHER): Payer: HMO | Admitting: Sports Medicine

## 2020-08-19 VITALS — BP 191/80 | HR 56 | Ht 61.0 in | Wt 134.0 lb

## 2020-08-19 DIAGNOSIS — I10 Essential (primary) hypertension: Secondary | ICD-10-CM

## 2020-08-19 DIAGNOSIS — E785 Hyperlipidemia, unspecified: Secondary | ICD-10-CM

## 2020-08-19 DIAGNOSIS — E118 Type 2 diabetes mellitus with unspecified complications: Secondary | ICD-10-CM | POA: Diagnosis not present

## 2020-08-19 LAB — POCT GLYCOSYLATED HEMOGLOBIN (HGB A1C): Hemoglobin A1C: 6.6 % — AB (ref 4.0–5.6)

## 2020-08-19 MED ORDER — METFORMIN HCL 1000 MG PO TABS: 1000.0000 mg | ORAL_TABLET | Freq: Two times a day (BID) | ORAL | 3 refills | Status: DC

## 2020-08-19 MED ORDER — GLIPIZIDE ER 5 MG PO TB24: 5.0000 mg | ORAL_TABLET | Freq: Every day | ORAL | 3 refills | Status: DC

## 2020-08-19 MED ORDER — FENOFIBRATE 160 MG PO TABS: 160.0000 mg | ORAL_TABLET | Freq: Every day | ORAL | 3 refills | Status: DC

## 2020-08-19 NOTE — Addendum Note (Signed)
Addended by: Dema Severin on: 08/19/2020 10:54 AM   Modules accepted: Orders

## 2020-08-19 NOTE — Assessment & Plan Note (Signed)
Diabetes is under better control, A1c was 7.7% at the last visit, 6.6% today, we did switch to glipizide extended release and he has not really had any further episodes of hypoglycemia. No changes.

## 2020-08-19 NOTE — Patient Instructions (Signed)
Please send me home blood pressure readings over the next week

## 2020-08-19 NOTE — Assessment & Plan Note (Signed)
Blood pressure moderately elevated, typically normal and other settings. No signs or symptoms of endorgan damage. He will check his blood pressures at home over the next week and send me a diary, we can augment his blood pressure medicines as needed based on that.

## 2020-08-19 NOTE — Progress Notes (Signed)
    Procedures performed today:    None.  Independent interpretation of notes and tests performed by another provider:   None.  Brief History, Exam, Impression, and Recommendations:    Essential hypertension, benign Blood pressure moderately elevated, typically normal and other settings. No signs or symptoms of endorgan damage. He will check his blood pressures at home over the next week and send me a diary, we can augment his blood pressure medicines as needed based on that.  Type 2 diabetes mellitus with complication, without long-term current use of insulin (HCC) Diabetes is under better control, A1c was 7.7% at the last visit, 6.6% today, we did switch to glipizide extended release and he has not really had any further episodes of hypoglycemia. No changes.  I spent 30 minutes of total time managing this patient today, this includes chart review, face to face, and non-face to face time.   ___________________________________________ Gwen Her. Dianah Field, M.D., ABFM., CAQSM. Primary Care and [place] Instructor of [place] of [facility]

## 2020-08-20 LAB — COMPREHENSIVE METABOLIC PANEL
AG Ratio: 1.6 (calc) (ref 1.0–2.5)
ALT: 29 U/L (ref 9–46)
AST: 22 U/L (ref 10–35)
Albumin: 4.5 g/dL (ref 3.6–5.1)
Alkaline phosphatase (APISO): 41 U/L (ref 35–144)
BUN: 18 mg/dL (ref 7–25)
CO2: 27 mmol/L (ref 20–32)
Calcium: 10.1 mg/dL (ref 8.6–10.3)
Chloride: 103 mmol/L (ref 98–110)
Creat: 1.11 mg/dL (ref 0.70–1.18)
Globulin: 2.8 g/dL (calc) (ref 1.9–3.7)
Glucose, Bld: 151 mg/dL — ABNORMAL HIGH (ref 65–99)
Potassium: 4.9 mmol/L (ref 3.5–5.3)
Sodium: 139 mmol/L (ref 135–146)
Total Bilirubin: 0.4 mg/dL (ref 0.2–1.2)
Total Protein: 7.3 g/dL (ref 6.1–8.1)

## 2020-08-20 LAB — LIPID PANEL
Cholesterol: 122 mg/dL (ref <200)
HDL: 30 mg/dL — ABNORMAL LOW (ref >=40)
LDL Cholesterol (Calc): 71 mg/dL (calc)
Non-HDL Cholesterol (Calc): 92 mg/dL (calc) (ref <130)
Total CHOL/HDL Ratio: 4.1 (calc) (ref <5.0)
Triglycerides: 128 mg/dL (ref <150)

## 2020-08-20 LAB — CBC
HCT: 42.1 % (ref 38.5–50.0)
Hemoglobin: 14.3 g/dL (ref 13.2–17.1)
MCH: 29.1 pg (ref 27.0–33.0)
MCHC: 34 g/dL (ref 32.0–36.0)
MCV: 85.7 fL (ref 80.0–100.0)
MPV: 9.5 fL (ref 7.5–12.5)
Platelets: 337 10*3/uL (ref 140–400)
RBC: 4.91 10*6/uL (ref 4.20–5.80)
RDW: 13.6 % (ref 11.0–15.0)
WBC: 5.4 10*3/uL (ref 3.8–10.8)

## 2020-08-20 LAB — TSH: TSH: 1.05 mIU/L (ref 0.40–4.50)

## 2020-09-02 ENCOUNTER — Other Ambulatory Visit: Payer: Self-pay | Admitting: Sports Medicine

## 2020-09-08 ENCOUNTER — Ambulatory Visit: Payer: HMO | Attending: Otolaryngology

## 2020-09-08 DIAGNOSIS — G4733 Obstructive sleep apnea (adult) (pediatric): Secondary | ICD-10-CM | POA: Diagnosis not present

## 2020-09-09 ENCOUNTER — Other Ambulatory Visit: Payer: Self-pay

## 2020-09-15 ENCOUNTER — Other Ambulatory Visit: Payer: Self-pay | Admitting: Sports Medicine

## 2020-09-15 DIAGNOSIS — E118 Type 2 diabetes mellitus with unspecified complications: Secondary | ICD-10-CM

## 2020-09-15 DIAGNOSIS — E1169 Type 2 diabetes mellitus with other specified complication: Secondary | ICD-10-CM

## 2020-10-08 ENCOUNTER — Ambulatory Visit: Payer: HMO | Attending: Otolaryngology

## 2020-10-08 DIAGNOSIS — G4733 Obstructive sleep apnea (adult) (pediatric): Secondary | ICD-10-CM | POA: Insufficient documentation

## 2020-10-08 DIAGNOSIS — G4761 Periodic limb movement disorder: Secondary | ICD-10-CM | POA: Diagnosis not present

## 2020-10-13 ENCOUNTER — Ambulatory Visit (INDEPENDENT_AMBULATORY_CARE_PROVIDER_SITE_OTHER): Payer: HMO | Admitting: Sports Medicine

## 2020-10-13 ENCOUNTER — Encounter: Payer: Self-pay | Admitting: Sports Medicine

## 2020-10-13 ENCOUNTER — Other Ambulatory Visit: Payer: Self-pay

## 2020-10-13 DIAGNOSIS — G4733 Obstructive sleep apnea (adult) (pediatric): Secondary | ICD-10-CM | POA: Diagnosis not present

## 2020-10-13 DIAGNOSIS — I10 Essential (primary) hypertension: Secondary | ICD-10-CM | POA: Diagnosis not present

## 2020-10-13 MED ORDER — AZITHROMYCIN 250 MG PO TABS: ORAL_TABLET | ORAL | 0 refills | Status: DC

## 2020-10-13 MED ORDER — LISINOPRIL-HYDROCHLOROTHIAZIDE 20-25 MG PO TABS: 1.0000 | ORAL_TABLET | Freq: Every day | ORAL | 3 refills | Status: DC

## 2020-10-13 NOTE — Progress Notes (Addendum)
    Procedures performed today:    None.  Independent interpretation of notes and tests performed by another provider:   None.  Brief History, Exam, Impression, and Recommendations:    Essential hypertension, benign Uncontrolled hypertension, switching from lisinopril to lisinopril/HCTZ high-dose, he will return in 2 weeks for nurse visit BP check.  Obstructive apnea This pleasant 75 year old male also has obstructive sleep apnea, he continues to use his current machine which is in disrepair, and he will need a new machine.  He will continue to benefit from CPAP indefinitely.    ___________________________________________ Gwen Her. Dianah Field, M.D., ABFM., CAQSM. Primary Care and [place] Instructor of [place] of [facility]

## 2020-10-13 NOTE — Assessment & Plan Note (Signed)
Uncontrolled hypertension, switching from lisinopril to lisinopril/HCTZ high-dose, he will return in 2 weeks for nurse visit BP check.

## 2020-10-27 ENCOUNTER — Ambulatory Visit (INDEPENDENT_AMBULATORY_CARE_PROVIDER_SITE_OTHER): Payer: HMO | Admitting: Sports Medicine

## 2020-10-27 ENCOUNTER — Other Ambulatory Visit: Payer: Self-pay

## 2020-10-27 VITALS — BP 126/63 | HR 71

## 2020-10-27 DIAGNOSIS — I10 Essential (primary) hypertension: Secondary | ICD-10-CM

## 2020-10-27 NOTE — Progress Notes (Signed)
Yes, they are under the procedures tab, he did have sleep apnea with an AHI up to 44, improved significantly on the follow-up CPAP titration study.

## 2020-10-27 NOTE — Progress Notes (Signed)
Pt in for bp check.  Numbers are a lot better at 126/63 today.  Pt was questioning if you were going to keep him on the Lisinopril HCTZ 20-25mg  of if he is to stop after the 30 days.  I advised him that you would keep him on it since his bp is now at goal.  Pt is also wondering about his sleep study results.  The last test was done on March 9 at [facility].  He said he's tried to call Dr. Reola Mosher office a few times but is not getting any return calls.  Are you able to see his results?? Please advise.

## 2020-10-28 ENCOUNTER — Other Ambulatory Visit: Payer: Self-pay | Admitting: Sports Medicine

## 2020-10-28 DIAGNOSIS — G4733 Obstructive sleep apnea (adult) (pediatric): Secondary | ICD-10-CM

## 2020-10-28 NOTE — Progress Notes (Signed)
Pt notified to stay on bp med and that his cpap order was sent to [facility].

## 2020-11-02 NOTE — Assessment & Plan Note (Signed)
This pleasant 75 year old male also has obstructive sleep apnea, he continues to use his current machine which is in disrepair, and he will need a new machine.  He will continue to benefit from CPAP indefinitely.

## 2020-11-06 ENCOUNTER — Other Ambulatory Visit: Payer: Self-pay

## 2020-11-06 DIAGNOSIS — I10 Essential (primary) hypertension: Secondary | ICD-10-CM

## 2020-11-06 MED ORDER — LISINOPRIL-HYDROCHLOROTHIAZIDE 20-25 MG PO TABS: 1.0000 | ORAL_TABLET | Freq: Every day | ORAL | 0 refills | Status: DC

## 2020-11-10 ENCOUNTER — Other Ambulatory Visit: Payer: Self-pay | Admitting: Sports Medicine

## 2020-11-13 ENCOUNTER — Other Ambulatory Visit: Payer: Self-pay | Admitting: Sports Medicine

## 2020-11-13 DIAGNOSIS — I10 Essential (primary) hypertension: Secondary | ICD-10-CM

## 2020-11-14 ENCOUNTER — Other Ambulatory Visit: Payer: Self-pay | Admitting: Sports Medicine

## 2020-11-14 DIAGNOSIS — I10 Essential (primary) hypertension: Secondary | ICD-10-CM

## 2020-11-28 ENCOUNTER — Other Ambulatory Visit: Payer: Self-pay | Admitting: Sports Medicine

## 2020-11-28 DIAGNOSIS — N138 Other obstructive and reflux uropathy: Secondary | ICD-10-CM

## 2020-11-28 DIAGNOSIS — N401 Enlarged prostate with lower urinary tract symptoms: Secondary | ICD-10-CM

## 2020-12-24 ENCOUNTER — Telehealth: Payer: Self-pay | Admitting: Sports Medicine

## 2020-12-24 NOTE — Chronic Care Management (AMB) (Signed)
  Chronic Care Management   Outreach Note  12/24/2020 Name: Michael Thomas MRN: [id number] DOB: 12-Apr-1946  Referred by: Silverio Decamp, MD Reason for referral : No chief complaint on file.   An unsuccessful telephone outreach was attempted today. The patient was referred to the pharmacist for assistance with care management and care coordination.   Follow Up Plan:   Lauretta Grill Upstream Scheduler

## 2021-01-07 ENCOUNTER — Telehealth: Payer: Self-pay | Admitting: Sports Medicine

## 2021-01-07 NOTE — Chronic Care Management (AMB) (Signed)
  Chronic Care Management   Note  01/07/2021 Name: Michael Thomas MRN: [id number] DOB: [birth year]  Michael Thomas is a 75 y.o. year old male who is a primary care patient of Thekkekandam, Gwen Her, MD. I reached out to [place] by phone today in response to a referral sent by Mr. Michael Thomas's PCP, Silverio Decamp, MD.   Michael Thomas was given information about Chronic Care Management services today including:  1. CCM service includes personalized support from designated clinical staff supervised by his physician, including individualized plan of care and coordination with other care providers 2. 24/7 contact phone numbers for assistance for urgent and routine care needs. 3. Service will only be billed when office clinical staff spend 20 minutes or more in a month to coordinate care. 4. Only one practitioner may furnish and bill the service in a calendar month. 5. The patient may stop CCM services at any time (effective at the end of the month) by phone call to the office staff.   Patient wishes to consider information provided and/or speak with a member of the care team before deciding about enrollment in care management services.   Follow up plan:   Lauretta Grill Upstream Scheduler

## 2021-01-08 ENCOUNTER — Other Ambulatory Visit: Payer: Self-pay | Admitting: Interventional Radiology

## 2021-01-08 DIAGNOSIS — I739 Peripheral vascular disease, unspecified: Secondary | ICD-10-CM

## 2021-01-15 ENCOUNTER — Other Ambulatory Visit: Payer: Self-pay

## 2021-01-15 DIAGNOSIS — E785 Hyperlipidemia, unspecified: Secondary | ICD-10-CM

## 2021-01-15 MED ORDER — GLUCOSE BLOOD VI STRP: ORAL_STRIP | 12 refills | Status: DC

## 2021-01-15 MED ORDER — FENOFIBRATE 160 MG PO TABS: 160.0000 mg | ORAL_TABLET | Freq: Every day | ORAL | 3 refills | Status: DC

## 2021-01-20 ENCOUNTER — Ambulatory Visit
Admission: RE | Admit: 2021-01-20 | Discharge: 2021-01-20 | Disposition: A | Discharge status: Home / Self Care | Payer: HMO | Source: Ambulatory Visit | Attending: Interventional Radiology | Admitting: Interventional Radiology

## 2021-01-20 DIAGNOSIS — I743 Embolism and thrombosis of arteries of the lower extremities: Secondary | ICD-10-CM | POA: Diagnosis not present

## 2021-01-20 DIAGNOSIS — I739 Peripheral vascular disease, unspecified: Secondary | ICD-10-CM | POA: Diagnosis not present

## 2021-01-20 HISTORY — PX: IR RADIOLOGIST EVAL & MGMT: IMG5224

## 2021-01-20 NOTE — Progress Notes (Signed)
Chief Complaint: PAD, worsening right leg pain  Referring Physician(s): Dr. Dianah Field   History of Present Illness: Michael Thomas is a 75 y.o. male presenting today for follow up of his known bilateral PAD. He has worsening right lower extremity claudication.   Michael Thomas joins [place] today in the clinic by himself.  Hx: We met Michael Thomas in June of 2019 when he was referred for symptoms of left leg claudication.  He leads a very active lifestyle with his wife, and it is important to him to exercise/ambulate without pain.    We treated him in 2019 for his left leg claudication, and then eventually his right leg as well. The series of treatments has been: 02/20/18: Left leg claudication 05/14/19: Right leg claudication 05/07/20: Recurrent left leg claudication   At this time, he tells me that he has routinely been going to the gym to walk on the treadmill.  He typically has the rate at about 3-4 miles per hour, which he was doing comfortably up until about 2-3 weeks ago.  As of then, he was having right calf pain, such that there is cramping during his workout, which resolves when resting.  He also says that the left calf has started getting fatigue and some pain during workouts, but not nearly as bad as the right.    He denies any rest pain.  If he is walking uphill, the pain is present until he stops.    Otherwise, he has no new symptoms.    ABI/segmental today Right resting ABI: 0.78 Post exercise ABI: 0.32 Left resting ABI: 0.93 Post exercise ABI: 0.90   His main goal is to be symptom free when he is walking and working out on treadmill at the gym, and he is motivated to be treated.       Past Medical History:  Diagnosis Date   Benign prostatic hyperplasia with urinary obstruction 03/25/2015   Diabetes mellitus without complication (HCC)    Hyperlipidemia    Kidney stone    OSA (obstructive sleep apnea)    Vitamin D deficiency     Past Surgical History:   Procedure Laterality Date   DG ARTHRO THUMB*L*     IR ANGIOGRAM EXTREMITY BILATERAL  02/20/2018   IR ANGIOGRAM EXTREMITY BILATERAL  04/05/2019   IR ANGIOGRAM EXTREMITY LEFT  05/07/2020   IR ANGIOGRAM SELECTIVE EACH ADDITIONAL VESSEL  02/20/2018   IR FEM POP ART ATHERECT INC PTA MOD SED  02/20/2018   IR FEM POP ART ATHERECT INC PTA MOD SED  05/14/2019   IR FEM POP ART ATHERECT INC PTA MOD SED  05/07/2020   IR RADIOLOGIST EVAL & MGMT  02/01/2018   IR RADIOLOGIST EVAL & MGMT  03/20/2018   IR RADIOLOGIST EVAL & MGMT  07/25/2018   IR RADIOLOGIST EVAL & MGMT  03/07/2019   IR RADIOLOGIST EVAL & MGMT  04/23/2019   IR RADIOLOGIST EVAL & MGMT  06/18/2019   IR RADIOLOGIST EVAL & MGMT  04/29/2020   IR RADIOLOGIST EVAL & MGMT  06/04/2020   IR US GUIDE VASC ACCESS LEFT  04/05/2019   IR US GUIDE VASC ACCESS RIGHT  02/20/2018   IR US GUIDE VASC ACCESS RIGHT  05/14/2019   IR US GUIDE VASC ACCESS RIGHT  05/07/2020   NECK SURGERY     VASECTOMY      Allergies: Amoxicillin  Medications: Prior to Admission medications   Medication Sig Start Date End Date Taking? Authorizing Provider  AMBULATORY NON FORMULARY  MEDICATION One Touch Ultra 2 glucometer 05/15/20   Silverio Decamp, MD  aspirin EC 81 MG tablet Take 81 mg by mouth daily.     [provider]  Blood Glucose Monitoring Suppl (ONE TOUCH ULTRA 2) w/Device KIT DM E11.8. Check fasting blood sugar every morning and 2 hours after largest meal of the day. 05/18/20   Silverio Decamp, MD  fenofibrate 160 MG tablet Take 1 tablet (160 mg total) by mouth daily. 01/15/21   Silverio Decamp, MD  glipiZIDE (GLUCOTROL XL) 5 MG 24 hr tablet Take 1 tablet (5 mg total) by mouth daily with breakfast. 08/19/20   Silverio Decamp, MD  glucose blood test strip Use as instructed 01/15/21   Silverio Decamp, MD  JANUVIA 50 MG tablet TAKE 1 TABLET BY MOUTH EVERY DAY 09/16/20   Silverio Decamp, MD  Lancets [facility] ULTRASOFT) lancets USE AS  INSTRUCTED 03/02/20   Silverio Decamp, MD  lisinopril-hydrochlorothiazide (ZESTORETIC) 20-25 MG tablet TAKE 1 TABLET BY MOUTH EVERY DAY 11/13/20   Silverio Decamp, MD  metFORMIN (GLUCOPHAGE) 1000 MG tablet TAKE 1 TABLET (1,000 MG TOTAL) BY MOUTH 2 (TWO) TIMES DAILY WITH A MEAL. 09/02/20   Silverio Decamp, MD  omega-3 acid ethyl esters (LOVAZA) 1 g capsule Take 2 capsules (2 g total) by mouth 2 (two) times daily. 02/18/20   Silverio Decamp, MD  oxybutynin (DITROPAN-XL) 10 MG 24 hr tablet Take 10 mg by mouth daily.    [provider]  simvastatin (ZOCOR) 40 MG tablet TAKE ONE TABLET BY MOUTH DAILY AT 6PM 11/10/20   Silverio Decamp, MD  tamsulosin (FLOMAX) 0.4 MG CAPS capsule TAKE 1 CAPSULE BY MOUTH 2 TIMES DAILY. 11/30/20   Silverio Decamp, MD     Family History  Problem Relation Age of Onset   Stroke Mother    Diabetes Mellitus II Mother    Prostate cancer Neg Hx    Kidney cancer Neg Hx    Bladder Cancer Neg Hx     Social History   Socioeconomic History   Marital status: Married    Spouse name: Not on file   Number of children: Not on file   Years of education: Not on file   Highest education level: Not on file  Occupational History   Not on file  Tobacco Use   Smoking status: Never   Smokeless tobacco: Never  Substance and Sexual Activity   Alcohol use: No   Drug use: No   Sexual activity: Yes    Birth control/protection: None  Other Topics Concern   Not on file  Social History Narrative   Not on file   Social Determinants of Health   Financial Resource Strain: Not on file  Food Insecurity: Not on file  Transportation Needs: Not on file  Physical Activity: Not on file  Stress: Not on file  Social Connections: Not on file       Review of Systems: A 12 point ROS discussed and pertinent positives are indicated in the HPI above.  All other systems are negative.  Review of Systems  Vital Signs: There were no vitals taken  for this visit.  Physical Exam General: 75 yo male appearing stated age.  Well-developed, well-nourished.  No distress. HEENT: Atraumatic, normocephalic.  Conjugate gaze, extra-ocular motor intact. No scleral icterus or scleral injection. No lesions on external ears, nose, lips, or gums.   Neck: Symmetric with no goiter enlargement.  Chest/Lungs:  Symmetric  chest with inspiration/expiration.  No labored breathing.   Marland Kitchen  Heart:    No JVD appreciated.  Abdomen:  Soft, NT/ND, with + bowel sounds.   Genito-urinary: Deferred Neurologic: Alert & Oriented to person, place, and time.   Normal affect and insight.  Appropriate questions.  Moving all 4 extremities with gross sensory intact.  Pulse Exam:  Palpable left AT, doppler positive left PT. Palpable right PT, doppler positive right AT Extremities: No wound.  Capillary refill prompt. Hairless.  Symmetric warmth   Imaging: No results found.  Labs:  CBC: Recent Labs    05/07/20 0759 08/19/20 0000  WBC 6.2 5.4  HGB 13.5 14.3  HCT 42.1 42.1  PLT 330 337    COAGS: Recent Labs    05/07/20 0759  INR 1.0    BMP: Recent Labs    05/07/20 0759 08/19/20 0000  NA 137 139  K 4.4 4.9  CL 106 103  CO2 20* 27  GLUCOSE 134* 151*  BUN 25* 18  CALCIUM 9.3 10.1  CREATININE 1.35* 1.11  GFRNONAA 51*  --   GFRAA 59*  --     LIVER FUNCTION TESTS: Recent Labs    08/19/20 0000  BILITOT 0.4  AST 22  ALT 29  PROT 7.3    TUMOR MARKERS: No results for input(s): AFPTM, CEA, CA199, CHROMGRNA in the last 8760 hours.  Assessment and Plan:  Assessment:  Michael Thomas is a 75yo male presenting with recurrent life-style limiting, short-distance claudication of the right lower extremity compatible with Rutherford 3 class symptoms  Non-invasive lower extremity suggests recurrent right distal fem-pop disease.    I refreshed our former discussion regarding anatomy, pathology/pathophysiology, natural history, and prognosis of PAD/CLI.  Informed  consent regarding treatment strategies was again performed which would possibly include medical management, walking program surgical strategy, and/or endovascular options, with risk/benefit discussion.    He is very motivated to get treated for symptom relief, with a main goal of being symptom free on the treadmill such that he can perform more strenuous CV workouts.   Given this he would like to proceed with endovascular options.   Regarding endovascular options, specific risks discussed include: bleeding, infection, contrast reaction, renal injury/nephropathy, arterial injury/dissection, need for additional procedure/surgery, worsening symptoms/tissue including limb loss, cardiopulmonary collapse, death.    Continued maximal medical therapy for reduction of risk factors is indicated as recommended by updated AHA guidelines1.  This includes anti-platelet medication, tight blood glucose control to a HbA1c < 7, tight blood pressure control, maximum-dose HMG-CoA reductase inhibitor, and smoking cessation.     Plan: - Plan for aorto-peripheral angiogram and possible intervention targeting the right lower extremity with Dr. Earleen Newport at [facility].  We will also plan for IVUS eval given his history. . -Recommend continued maximal medical therapy     ___________________________________________________________________   1Morley Kos MD, et al. 2016 AHA/ACC Guideline on the Management of Patients With Lower Extremity Peripheral Artery Disease: Executive Summary: A Report of the American College of Cardiology/American Heart Association Task Force on Clinical Practice Guidelines. J Am Coll Cardiol. 2017 Mar 21;69(11):1465-1508. doi: 10.1016/j.jacc.2016.11.008.   2 - Norgren L, et al. TASC II Working Group. Inter-society consensus for the management of peripheral arterial disease. Int Tressia Miners. 2007 Jun;26(2):81-157. Review. PubMed PMID: 62035597  3 - Hingorani A, et al. The management of diabetic foot: A  clinical practice guideline by the Society for Vascular Surgery in collaboration with the [place] and the Society  for Vascular Medicine. J  Vasc Surg. 2016 Feb;63(2 Suppl):3S-21S. doi: 10.1016/j.jvs.2015.10.003. PubMed PMID: 81275170.  4 - Corinna Gab, Saab FA, Luberta Mutter, Grant Ruts, Ewell Poe, Driver VR, [place]well, Lookstein R, van den Baldemar Lenis, Jaff Michael, Guadalupe Dawn, Henao S, AlMahameed A, Katzen B. Digital Subtraction Angiography Prior to an Amputation for Critical Limb Ischemia (CLI): An Expert Recommendation Statement From the CLI Global Society to Optimize Limb Salvage. J Endovasc Ther. 2020 Aug;27(4):540-546. doi: 10.1177/1526602820928590. Epub 2020 May 29. PMID: 01749449.    Electronically Signed: Corrie Mckusick 01/20/2021, 4:59 PM   I spent a total of    40 Minutes in face to face in clinical consultation, greater than 50% of which was counseling/coordinating care for recurrent right LE Rutherford 3 symptoms of PAD, possible angiogram/intervention.

## 2021-01-21 ENCOUNTER — Encounter: Payer: Self-pay | Admitting: *Deleted

## 2021-01-26 DIAGNOSIS — R3915 Urgency of urination: Secondary | ICD-10-CM | POA: Diagnosis not present

## 2021-01-26 DIAGNOSIS — R350 Frequency of micturition: Secondary | ICD-10-CM | POA: Diagnosis not present

## 2021-01-26 DIAGNOSIS — R351 Nocturia: Secondary | ICD-10-CM | POA: Diagnosis not present

## 2021-01-26 DIAGNOSIS — N401 Enlarged prostate with lower urinary tract symptoms: Secondary | ICD-10-CM | POA: Diagnosis not present

## 2021-01-27 ENCOUNTER — Other Ambulatory Visit (HOSPITAL_COMMUNITY): Payer: Self-pay | Admitting: Interventional Radiology

## 2021-01-27 DIAGNOSIS — I739 Peripheral vascular disease, unspecified: Secondary | ICD-10-CM

## 2021-01-28 ENCOUNTER — Other Ambulatory Visit: Payer: Self-pay

## 2021-01-28 DIAGNOSIS — E118 Type 2 diabetes mellitus with unspecified complications: Secondary | ICD-10-CM

## 2021-01-28 MED ORDER — ONETOUCH ULTRASOFT LANCETS MISC: 12 refills | Status: DC

## 2021-01-29 ENCOUNTER — Other Ambulatory Visit: Payer: Self-pay

## 2021-01-29 DIAGNOSIS — E118 Type 2 diabetes mellitus with unspecified complications: Secondary | ICD-10-CM

## 2021-01-29 MED ORDER — ONETOUCH DELICA LANCETS 30G MISC: 99 refills | Status: DC

## 2021-02-02 ENCOUNTER — Telehealth (HOSPITAL_COMMUNITY): Payer: Self-pay | Admitting: Student

## 2021-02-02 MED ORDER — CLOPIDOGREL BISULFATE 75 MG PO TABS: 75.0000 mg | ORAL_TABLET | Freq: Every day | ORAL | 0 refills | Status: AC

## 2021-02-02 NOTE — Telephone Encounter (Signed)
Patient scheduled for IR procedure February 15, 2021 - aorto-peripheral angiogram with possible intervention targeting the right lower extremity. Patient needs prescription for 75 mg Plavix so he can start taking this drug 5 days prior to his procedure (February 10, 2021). Per Dr. Earleen Newport, patient needs 90 day supply.  This drug was e-prescribed to the patient's preferred pharmacy [facility] [place]. 75 mg tablets x 90 e-prescribed. I called the patient to notify him of these instructions and that his prescription was sent in. He has the number to the APP office at [facility] if he has additional questions prior to his procedure.   Soyla Dryer, [place] [phone] 02/02/2021, 4:54 PM

## 2021-02-11 ENCOUNTER — Other Ambulatory Visit: Payer: Self-pay | Admitting: Radiology

## 2021-02-15 ENCOUNTER — Other Ambulatory Visit (HOSPITAL_COMMUNITY): Payer: Self-pay | Admitting: Interventional Radiology

## 2021-02-15 ENCOUNTER — Other Ambulatory Visit: Payer: Self-pay

## 2021-02-15 ENCOUNTER — Encounter (HOSPITAL_COMMUNITY): Payer: Self-pay

## 2021-02-15 ENCOUNTER — Ambulatory Visit (HOSPITAL_COMMUNITY)
Admission: RE | Admit: 2021-02-15 | Discharge: 2021-02-15 | Disposition: A | Discharge status: Home / Self Care | Payer: HMO | Source: Ambulatory Visit | Attending: Interventional Radiology | Admitting: Interventional Radiology

## 2021-02-15 DIAGNOSIS — Z7902 Long term (current) use of antithrombotics/antiplatelets: Secondary | ICD-10-CM | POA: Insufficient documentation

## 2021-02-15 DIAGNOSIS — G4733 Obstructive sleep apnea (adult) (pediatric): Secondary | ICD-10-CM | POA: Diagnosis not present

## 2021-02-15 DIAGNOSIS — I739 Peripheral vascular disease, unspecified: Secondary | ICD-10-CM

## 2021-02-15 DIAGNOSIS — E1151 Type 2 diabetes mellitus with diabetic peripheral angiopathy without gangrene: Secondary | ICD-10-CM | POA: Diagnosis not present

## 2021-02-15 DIAGNOSIS — Z7984 Long term (current) use of oral hypoglycemic drugs: Secondary | ICD-10-CM | POA: Insufficient documentation

## 2021-02-15 DIAGNOSIS — Z79899 Other long term (current) drug therapy: Secondary | ICD-10-CM | POA: Diagnosis not present

## 2021-02-15 DIAGNOSIS — Z881 Allergy status to other antibiotic agents status: Secondary | ICD-10-CM | POA: Insufficient documentation

## 2021-02-15 DIAGNOSIS — I771 Stricture of artery: Secondary | ICD-10-CM | POA: Diagnosis not present

## 2021-02-15 DIAGNOSIS — N400 Enlarged prostate without lower urinary tract symptoms: Secondary | ICD-10-CM | POA: Diagnosis not present

## 2021-02-15 DIAGNOSIS — I70211 Atherosclerosis of native arteries of extremities with intermittent claudication, right leg: Secondary | ICD-10-CM | POA: Diagnosis not present

## 2021-02-15 DIAGNOSIS — Z7982 Long term (current) use of aspirin: Secondary | ICD-10-CM | POA: Diagnosis not present

## 2021-02-15 DIAGNOSIS — I7389 Other specified peripheral vascular diseases: Secondary | ICD-10-CM | POA: Diagnosis not present

## 2021-02-15 HISTORY — PX: IR FEM POP ART ATHERECT INC PTA MOD SED: IMG2310

## 2021-02-15 HISTORY — PX: IR US GUIDE VASC ACCESS LEFT: IMG2389

## 2021-02-15 HISTORY — PX: IR INTRAVASCULAR ULTRASOUND NON CORONARY: IMG6085

## 2021-02-15 HISTORY — PX: IR ANGIOGRAM EXTREMITY RIGHT: IMG652

## 2021-02-15 LAB — CBC
HCT: 37.6 % — ABNORMAL LOW (ref 39.0–52.0)
Hemoglobin: 12.5 g/dL — ABNORMAL LOW (ref 13.0–17.0)
MCH: 29.5 pg (ref 26.0–34.0)
MCHC: 33.2 g/dL (ref 30.0–36.0)
MCV: 88.7 fL (ref 80.0–100.0)
Platelets: 349 10*3/uL (ref 150–400)
RBC: 4.24 MIL/uL (ref 4.22–5.81)
RDW: 13.5 % (ref 11.5–15.5)
WBC: 6.4 10*3/uL (ref 4.0–10.5)
nRBC: 0 % (ref 0.0–0.2)

## 2021-02-15 LAB — BASIC METABOLIC PANEL
Anion gap: 10 (ref 5–15)
BUN: 44 mg/dL — ABNORMAL HIGH (ref 8–23)
CO2: 22 mmol/L (ref 22–32)
Calcium: 10.5 mg/dL — ABNORMAL HIGH (ref 8.9–10.3)
Chloride: 107 mmol/L (ref 98–111)
Creatinine, Ser: 1.73 mg/dL — ABNORMAL HIGH (ref 0.61–1.24)
GFR, Estimated: 40 mL/min — ABNORMAL LOW (ref >60)
Glucose, Bld: 100 mg/dL — ABNORMAL HIGH (ref 70–99)
Potassium: 4.6 mmol/L (ref 3.5–5.1)
Sodium: 139 mmol/L (ref 135–145)

## 2021-02-15 LAB — GLUCOSE, CAPILLARY: Glucose-Capillary: 117 mg/dL — ABNORMAL HIGH (ref 70–99)

## 2021-02-15 LAB — PROTIME-INR
INR: 1 (ref 0.8–1.2)
Prothrombin Time: 12.8 seconds (ref 11.4–15.2)

## 2021-02-15 MED ORDER — FENTANYL CITRATE (PF) 100 MCG/2ML IJ SOLN
INTRAMUSCULAR | Status: AC
  Filled 2021-02-15: qty 2

## 2021-02-15 MED ORDER — LIDOCAINE HCL 1 % IJ SOLN
INTRAMUSCULAR | Status: AC
  Administered 2021-02-15: 5 mL via SUBCUTANEOUS
  Filled 2021-02-15: qty 20

## 2021-02-15 MED ORDER — SODIUM CHLORIDE 0.9 % IV SOLN
INTRAVENOUS | Status: AC | PRN
  Administered 2021-02-15: 10 mL/h via INTRAVENOUS

## 2021-02-15 MED ORDER — IOHEXOL 300 MG/ML  SOLN
100.0000 mL | Freq: Once | INTRAMUSCULAR | Status: AC | PRN
  Administered 2021-02-15: 20 mL via INTRA_ARTERIAL

## 2021-02-15 MED ORDER — MIDAZOLAM HCL 2 MG/2ML IJ SOLN
INTRAMUSCULAR | Status: AC | PRN
  Administered 2021-02-15: 0.5 mg via INTRAVENOUS
  Administered 2021-02-15: 1 mg via INTRAVENOUS
  Administered 2021-02-15: 0.5 mg via INTRAVENOUS

## 2021-02-15 MED ORDER — HEPARIN SODIUM (PORCINE) 1000 UNIT/ML IJ SOLN
INTRAMUSCULAR | Status: AC | PRN
  Administered 2021-02-15: 4000 [IU] via INTRAVENOUS

## 2021-02-15 MED ORDER — FENTANYL CITRATE (PF) 100 MCG/2ML IJ SOLN
INTRAMUSCULAR | Status: AC | PRN
  Administered 2021-02-15: 50 ug via INTRAVENOUS
  Administered 2021-02-15 (×2): 25 ug via INTRAVENOUS

## 2021-02-15 MED ORDER — HEPARIN SODIUM (PORCINE) 1000 UNIT/ML IJ SOLN
INTRAMUSCULAR | Status: AC
  Filled 2021-02-15: qty 1

## 2021-02-15 MED ORDER — IOHEXOL 300 MG/ML  SOLN
100.0000 mL | Freq: Once | INTRAMUSCULAR | Status: AC | PRN
  Administered 2021-02-15: 55 mL via INTRA_ARTERIAL

## 2021-02-15 MED ORDER — MIDAZOLAM HCL 2 MG/2ML IJ SOLN
INTRAMUSCULAR | Status: AC
  Filled 2021-02-15: qty 2

## 2021-02-15 MED ORDER — SODIUM CHLORIDE 0.9 % IV SOLN: INTRAVENOUS | Status: DC

## 2021-02-15 NOTE — Sedation Documentation (Signed)
Left groin sheath removed. 6Fr. angioseal deployed

## 2021-02-15 NOTE — Procedures (Addendum)
Interventional Radiology Procedure Note  Procedure:    US guided left CFA access.   Right LE angiogram. SFA IVUS  Directional atherectomy SFA.  Treatment with 32mm 25mm DEB.  Angioseal for closure .  Complications: None  Recommendations:  - Left hip straight x 4 hours - advance diet - Ok to shower tomorrow - Do not submerge for 7 days - HOLD METFORMIN x 48 hours, restart Wed morning - Routine wound care  - follow up with Dr. Earleen Newport in 4-6 weeks with repeat non-invasive exam  Signed,  Dulcy Fanny. Earleen Newport, DO

## 2021-02-15 NOTE — H&P (Signed)
Chief Complaint: Patient was seen in consultation today for peripheral vascular disease  Supervising Physician: Corrie Mckusick  Patient Status: [name] - Out-pt  History of Present Illness: Michael Thomas is a 75 y.o. male with a medical history significant for BPH, DM2, obstructive sleep apnea and PAD. He is s/p treatment of [place] 3 RLE symptomatic lesion 05/14/2019.  He is also s/p treatment of left SFA for critical stenosis July of 2019 and atherectomy of right SFA stenosis with angioplasty.  He met with  Dr. Earleen Newport 6/15/2 to discuss recurring symptoms and elected to proceed with endovascular intervention.  Michael Thomas presents today in his usual state of health.  He report stable symptoms that are lifestyle limiting for him.  They are not worsened.  He has no new concerns or complaints.  Denies fever, chills, nausea, vomiting, abdominal pain, dysuria.   He does take Plavix and aspirin at home consistently and last took last PM.   He has been NPO today.  He has transportation home today where he lives with his wife.    Past Medical History:  Diagnosis Date   Benign prostatic hyperplasia with urinary obstruction 03/25/2015   Diabetes mellitus without complication (HCC)    Hyperlipidemia    Kidney stone    OSA (obstructive sleep apnea)    Vitamin D deficiency     Past Surgical History:  Procedure Laterality Date   DG ARTHRO THUMB*L*     IR ANGIOGRAM EXTREMITY BILATERAL  02/20/2018   IR ANGIOGRAM EXTREMITY BILATERAL  04/05/2019   IR ANGIOGRAM EXTREMITY LEFT  05/07/2020   IR ANGIOGRAM SELECTIVE EACH ADDITIONAL VESSEL  02/20/2018   IR FEM POP ART ATHERECT INC PTA MOD SED  02/20/2018   IR FEM POP ART ATHERECT INC PTA MOD SED  05/14/2019   IR FEM POP ART ATHERECT INC PTA MOD SED  05/07/2020   IR RADIOLOGIST EVAL & MGMT  02/01/2018   IR RADIOLOGIST EVAL & MGMT  03/20/2018   IR RADIOLOGIST EVAL & MGMT  07/25/2018   IR RADIOLOGIST EVAL & MGMT  03/07/2019   IR RADIOLOGIST EVAL & MGMT   04/23/2019   IR RADIOLOGIST EVAL & MGMT  06/18/2019   IR RADIOLOGIST EVAL & MGMT  04/29/2020   IR RADIOLOGIST EVAL & MGMT  06/04/2020   IR RADIOLOGIST EVAL & MGMT  01/20/2021   IR US GUIDE VASC ACCESS LEFT  04/05/2019   IR US GUIDE VASC ACCESS RIGHT  02/20/2018   IR US GUIDE VASC ACCESS RIGHT  05/14/2019   IR US GUIDE VASC ACCESS RIGHT  05/07/2020   NECK SURGERY     VASECTOMY      Allergies: Amoxicillin  Medications: Prior to Admission medications   Medication Sig Start Date End Date Taking? Authorizing Provider  aspirin EC 81 MG tablet Take 81 mg by mouth daily.    Yes [provider]  clobetasol ointment (TEMOVATE) 7.00 % Apply 1 application topically 2 (two) times daily as needed (blisters).   Yes [provider]  clopidogrel (PLAVIX) 75 MG tablet Take 1 tablet (75 mg total) by mouth daily. Begin taking 75 mg Plavix daily on February 10, 2021. 02/02/21 05/03/21 Yes Covington, Roselyn Reef R, NP  fenofibrate 160 MG tablet Take 1 tablet (160 mg total) by mouth daily. 01/15/21  Yes Silverio Decamp, MD  fluticasone (FLONASE) 50 MCG/ACT nasal spray Place 1 spray into both nostrils daily as needed for allergies or rhinitis.   Yes [provider]  glipiZIDE (GLUCOTROL XL)  5 MG 24 hr tablet Take 1 tablet (5 mg total) by mouth daily with breakfast. 08/19/20  Yes Silverio Decamp, MD  JANUVIA 50 MG tablet TAKE 1 TABLET BY MOUTH EVERY DAY Patient taking differently: Take 50 mg by mouth daily. 09/16/20  Yes Silverio Decamp, MD  lisinopril-hydrochlorothiazide (ZESTORETIC) 20-25 MG tablet TAKE 1 TABLET BY MOUTH EVERY DAY Patient taking differently: Take 1 tablet by mouth daily. 11/13/20  Yes Silverio Decamp, MD  Magnesium 400 MG TABS Take 400 mg by mouth daily.   Yes [provider]  metFORMIN (GLUCOPHAGE) 1000 MG tablet TAKE 1 TABLET (1,000 MG TOTAL) BY MOUTH 2 (TWO) TIMES DAILY WITH A MEAL. 09/02/20  Yes Silverio Decamp, MD  omega-3 acid ethyl esters  (LOVAZA) 1 g capsule Take 2 capsules (2 g total) by mouth 2 (two) times daily. Patient taking differently: Take 1 g by mouth 3 (three) times daily. 02/18/20  Yes Silverio Decamp, MD  simvastatin (ZOCOR) 40 MG tablet TAKE ONE TABLET BY MOUTH DAILY AT 6PM Patient taking differently: Take 40 mg by mouth daily at 6 PM. 11/10/20  Yes Silverio Decamp, MD  tamsulosin (FLOMAX) 0.4 MG CAPS capsule TAKE 1 CAPSULE BY MOUTH 2 TIMES DAILY. Patient taking differently: Take 0.4 mg by mouth 2 (two) times daily. 11/30/20  Yes Silverio Decamp, MD  AMBULATORY NON FORMULARY MEDICATION One Touch Ultra 2 glucometer 05/15/20   Silverio Decamp, MD  Blood Glucose Monitoring Suppl (ONE TOUCH ULTRA 2) w/Device KIT DM E11.8. Check fasting blood sugar every morning and 2 hours after largest meal of the day. 05/18/20   Silverio Decamp, MD  glucose blood test strip Use as instructed 01/15/21   Silverio Decamp, MD  OneTouch Delica Lancets 37T MISC Dx DM Ell.8. Check fasting blood sugar daily and after largest meal a couple times a week. 01/29/21   Silverio Decamp, MD     Family History  Problem Relation Age of Onset   Stroke Mother    Diabetes Mellitus II Mother    Prostate cancer Neg Hx    Kidney cancer Neg Hx    Bladder Cancer Neg Hx     Social History   Socioeconomic History   Marital status: Married    Spouse name: Not on file   Number of children: Not on file   Years of education: Not on file   Highest education level: Not on file  Occupational History   Not on file  Tobacco Use   Smoking status: Never   Smokeless tobacco: Never  Substance and Sexual Activity   Alcohol use: No   Drug use: No   Sexual activity: Yes    Birth control/protection: None  Other Topics Concern   Not on file  Social History Narrative   Not on file   Social Determinants of Health   Financial Resource Strain: Not on file  Food Insecurity: Not on file  Transportation Needs: Not on  file  Physical Activity: Not on file  Stress: Not on file  Social Connections: Not on file     Review of Systems: A 12 point ROS discussed and pertinent positives are indicated in the HPI above.  All other systems are negative.  Review of Systems  Constitutional:  Negative for fatigue and fever.  Respiratory:  Negative for cough and shortness of breath.   Cardiovascular:  Negative for chest pain.  Gastrointestinal:  Negative for abdominal pain, diarrhea and nausea.  Genitourinary:  Negative for dysuria.  Musculoskeletal:  Negative for back pain.  Psychiatric/Behavioral:  Negative for behavioral problems and confusion.    Vital Signs: BP (!) 147/79   Temp 97.9 F (36.6 C) (Oral)   Resp 16   Ht '5\' 1"'  (1.549 m)   Wt 131 lb (59.4 kg)   SpO2 98%   BMI 24.75 kg/m   Physical Exam Vitals and nursing note reviewed.  Constitutional:      General: He is not in acute distress.    Appearance: Normal appearance. He is not ill-appearing.  HENT:     Mouth/Throat:     Mouth: Mucous membranes are moist.     Pharynx: Oropharynx is clear.  Cardiovascular:     Rate and Rhythm: Normal rate and regular rhythm.  Pulmonary:     Effort: Pulmonary effort is normal. No respiratory distress.     Breath sounds: Normal breath sounds.  Abdominal:     General: Abdomen is flat.     Palpations: Abdomen is soft.  Skin:    General: Skin is warm and dry.  Neurological:     General: No focal deficit present.     Mental Status: He is alert and oriented to person, place, and time. Mental status is at baseline.  Psychiatric:        Mood and Affect: Mood normal.        Behavior: Behavior normal.        Thought Content: Thought content normal.        Judgment: Judgment normal.     MD Evaluation Airway: WNL Heart: WNL Abdomen: WNL Chest/ Lungs: WNL ASA  Classification: 3 Mallampati/Airway Score: Two   Imaging: US ARTERIAL SEGMENTAL EXERCISE (USE FOR CLAUDICATION)  Result Date:  01/20/2021 CLINICAL DATA:  75 year old male with a history of lower extremity PA D, bilateral, with treatment of right-sided femoropopliteal disease 05/14/2019, left-sided femoropopliteal disease 02/20/2018 and 05/07/2020 EXAM: BILATERAL LOWER EXTREMITY ARTERIAL DUPLEX SCAN TECHNIQUE: Gray-scale sonography as well as color Doppler and duplex ultrasound was performed to evaluate the arteries of both lower extremities including the common, superficial and profunda femoral arteries, popliteal artery and calf arteries. COMPARISON:  06/01/2020 FINDINGS: Right: Resting ankle brachial index:  0.78 Post exercise ABI: 0.32 Segmental blood pressure: Upper extremity pressures are symmetric. Appropriate increased to the thigh. Forty point drop from the thigh to the calf. Digital pressure measures 75 systolic. Doppler: Segmental Doppler demonstrates triphasic waveform of the femoropopliteal segment. Triphasic dorsalis pedis. Triphasic posterior tibial artery. Pulse volume recording: Segmental PVR demonstrates maintained femoropopliteal segment with augmentation maintained. Deterioration at the ankle segment. Left: Resting ankle brachial index: 0.93 Post exercise ABI: 0.90 Segmental blood pressure: Upper extremity pressures are symmetric. Appropriate thigh pressure. No significant drop between segments with the digital pressure measuring 366 systolic. Doppler: Segmental Doppler demonstrates triphasic waveforms throughout. Pulse volume recording: Segmental PVR maintained throughout Additional: IMPRESSION: Right: Resting ABI in the mild range arterial occlusive disease, with drop to severe range arterial occlusive disease post exercise. Segmental exam demonstrates evidence of developing distal femoropopliteal disease. Left: Resting ABI and post exercise ABI within normal limits with the segmental exam demonstrating no significant arterial occlusive disease. Signed, Dulcy Fanny. Dellia Nims, RPVI Vascular and Interventional Radiology  Specialists [facility] Radiology Electronically Signed   By: Corrie Mckusick D.O.   On: 01/20/2021 16:58   IR Radiologist Eval & Mgmt  Result Date: 01/21/2021 Please refer to notes tab for details about interventional procedure. (Op Note)   Labs:  CBC: Recent Labs  05/07/20 0759 08/19/20 0000 02/15/21 0640  WBC 6.2 5.4 6.4  HGB 13.5 14.3 12.5*  HCT 42.1 42.1 37.6*  PLT 330 337 349    COAGS: Recent Labs    05/07/20 0759 02/15/21 0640  INR 1.0 1.0    BMP: Recent Labs    05/07/20 0759 08/19/20 0000 02/15/21 0640  NA 137 139 139  K 4.4 4.9 4.6  CL 106 103 107  CO2 20* 27 22  GLUCOSE 134* 151* 100*  BUN 25* 18 44*  CALCIUM 9.3 10.1 10.5*  CREATININE 1.35* 1.11 1.73*  GFRNONAA 51*  --  40*  GFRAA 59*  --   --     LIVER FUNCTION TESTS: Recent Labs    08/19/20 0000  BILITOT 0.4  AST 22  ALT 29  PROT 7.3    TUMOR MARKERS: No results for input(s): AFPTM, CEA, CA199, CHROMGRNA in the last 8760 hours.  Assessment and Plan: Patient with past medical history of DM, peripheral vascular disease presents with complaint of symptomatic claudication.   Case reviewed by Dr. Earleen Newport who has met the patient in consultation and approves patient for procedure.  Patient presents today in their usual state of health.  He has been NPO and is currently on aspirin and Plavix.   Risks and benefits were discussed with the patient including, but not limited to bleeding, infection, vascular injury or contrast induced renal failure.  This interventional procedure involves the use of X-rays and because of the nature of the planned procedure, it is possible that we will have prolonged use of X-ray fluoroscopy.  Potential radiation risks to you include (but are not limited to) the following: - A slightly elevated risk for cancer  several years later in life. This risk is typically less than 0.5% percent. This risk is low in comparison to the normal incidence of human cancer, which  is 33% for women and 50% for men according to the [place]. - Radiation induced injury can include skin redness, resembling a rash, tissue breakdown / ulcers and hair loss (which can be temporary or permanent).   The likelihood of either of these occurring depends on the difficulty of the procedure and whether you are sensitive to radiation due to previous procedures, disease, or genetic conditions.   IF your procedure requires a prolonged use of radiation, you will be notified and given written instructions for further action.  It is your responsibility to monitor the irradiated area for the 2 weeks following the procedure and to notify your physician if you are concerned that you have suffered a radiation induced injury.    All of the patient's questions were answered, patient is agreeable to proceed.  Consent signed and in chart.  Thank you for this interesting consult.  I greatly enjoyed meeting Kyon Claude Pop and look forward to participating in their care.  A copy of this report was sent to the requesting provider on this date.  Electronically Signed: Docia Barrier, PA 02/15/2021, 8:23 AM   I spent a total of  30 Minutes   in face to face in clinical consultation, greater than 50% of which was counseling/coordinating care for peripheral vascular disease, stenosis.

## 2021-02-16 ENCOUNTER — Ambulatory Visit: Payer: HMO | Admitting: Sports Medicine

## 2021-02-22 ENCOUNTER — Other Ambulatory Visit: Payer: Self-pay | Admitting: Sports Medicine

## 2021-03-03 ENCOUNTER — Ambulatory Visit (INDEPENDENT_AMBULATORY_CARE_PROVIDER_SITE_OTHER): Payer: HMO | Admitting: Sports Medicine

## 2021-03-03 ENCOUNTER — Encounter: Payer: Self-pay | Admitting: Sports Medicine

## 2021-03-03 ENCOUNTER — Other Ambulatory Visit: Payer: Self-pay

## 2021-03-03 ENCOUNTER — Ambulatory Visit: Payer: HMO | Admitting: Sports Medicine

## 2021-03-03 DIAGNOSIS — Z0000 Encounter for general adult medical examination without abnormal findings: Secondary | ICD-10-CM

## 2021-03-03 DIAGNOSIS — E1151 Type 2 diabetes mellitus with diabetic peripheral angiopathy without gangrene: Secondary | ICD-10-CM | POA: Diagnosis not present

## 2021-03-03 LAB — POCT GLYCOSYLATED HEMOGLOBIN (HGB A1C): HbA1c, POC (controlled diabetic range): 7.5 % — AB (ref 0.0–7.0)

## 2021-03-03 MED ORDER — ONETOUCH DELICA LANCETS 33G MISC: 11 refills | Status: DC

## 2021-03-03 NOTE — Assessment & Plan Note (Signed)
Up-to-date on all screening measures, vaccinations, diabetic eye exam was 2 weeks ago and normal.

## 2021-03-03 NOTE — Progress Notes (Signed)
    Procedures performed today:    None.  Independent interpretation of notes and tests performed by another provider:   None.  Brief History, Exam, Impression, and Recommendations:    Annual physical exam Up-to-date on all screening measures, vaccinations, diabetic eye exam was 2 weeks ago and normal.  Type 2 diabetes mellitus with peripheral vascular disease ([place]) Diabetes looks adequate, 7.5% today. He did have a trip to [place] and had dietary indiscretions which explains the increase from 6.6 at the last visit. No changes in plan, return as needed.    ___________________________________________ Gwen Her. Dianah Field, M.D., ABFM., CAQSM. Primary Care and [place] Instructor of [employer] [facility]

## 2021-03-03 NOTE — Assessment & Plan Note (Signed)
Diabetes looks adequate, 7.5% today. He did have a trip to [place] and had dietary indiscretions which explains the increase from 6.6 at the last visit. No changes in plan, return as needed.

## 2021-03-03 NOTE — Addendum Note (Signed)
Addended by: Dema Severin on: 03/03/2021 09:15 AM   Modules accepted: Orders

## 2021-03-05 ENCOUNTER — Other Ambulatory Visit: Payer: Self-pay | Admitting: Interventional Radiology

## 2021-03-05 DIAGNOSIS — I739 Peripheral vascular disease, unspecified: Secondary | ICD-10-CM

## 2021-03-23 DIAGNOSIS — R3915 Urgency of urination: Secondary | ICD-10-CM | POA: Diagnosis not present

## 2021-03-23 DIAGNOSIS — N401 Enlarged prostate with lower urinary tract symptoms: Secondary | ICD-10-CM | POA: Diagnosis not present

## 2021-03-23 DIAGNOSIS — R350 Frequency of micturition: Secondary | ICD-10-CM | POA: Diagnosis not present

## 2021-04-01 ENCOUNTER — Other Ambulatory Visit: Payer: HMO

## 2021-04-15 ENCOUNTER — Other Ambulatory Visit: Payer: Self-pay | Admitting: Sports Medicine

## 2021-04-15 DIAGNOSIS — I10 Essential (primary) hypertension: Secondary | ICD-10-CM

## 2021-04-27 ENCOUNTER — Encounter: Payer: Self-pay | Admitting: *Deleted

## 2021-04-27 ENCOUNTER — Ambulatory Visit
Admission: RE | Admit: 2021-04-27 | Discharge: 2021-04-27 | Disposition: A | Discharge status: Home / Self Care | Payer: HMO | Source: Ambulatory Visit | Attending: Interventional Radiology | Admitting: Interventional Radiology

## 2021-04-27 ENCOUNTER — Telehealth: Payer: Self-pay | Admitting: Sports Medicine

## 2021-04-27 DIAGNOSIS — I70201 Unspecified atherosclerosis of native arteries of extremities, right leg: Secondary | ICD-10-CM | POA: Diagnosis not present

## 2021-04-27 DIAGNOSIS — I70213 Atherosclerosis of native arteries of extremities with intermittent claudication, bilateral legs: Secondary | ICD-10-CM | POA: Diagnosis not present

## 2021-04-27 DIAGNOSIS — I739 Peripheral vascular disease, unspecified: Secondary | ICD-10-CM

## 2021-04-27 HISTORY — PX: IR RADIOLOGIST EVAL & MGMT: IMG5224

## 2021-04-27 NOTE — Progress Notes (Signed)
  Chief Complaint: PAD   Referring Physician(s): Dr. Thekkekandam   History of Present Illness: Michael Thomas is a 75 y.o. male presenting today for a scheduled follow up visit to [facility], with history of bilateral femoral-popliteal disease, treated most recently 02/15/21 for recurrent right fem-pop stenosis.    Michael Thomas joins us today in the clinic by himself.   History: We first met Michael Thomas 02/01/2018, for complaints of left sided claudication.  He had an opinion closer to his home in [place], but sought second opinion with ongoing symptoms after MRA demonstrated femoral-popliteal etiology.   Treatment history has been: 02/20/18: Left SFA directional atherectomy and DEB 05/14/2019: Right SFA directional atherectomy and DEB 05/07/20: Left SFA laser atherectomy and DEB for recurrent stenosis 02/15/21: Right SFA directional atherectomy and DEB for recurrent stenosis  Interval history:  Today Michael Thomas tells me that he is having recurrent cramping type symptoms of his left calf (SFA treated last year September), with walking on the treadmill on incline.  Also, earlier this week he had a new cramp in his right calf (SFA treated July), while walking on the treadmill.   He denies resting pain.  He has not noticed any discoloration of his toes.     ABI 01/20/2021 Right resting ABI: 0.78 Post exercise: 0.32  Left resting ABI: 0.93 Post exercise: 0.90  Updated ABI 04/27/21 Right resting ABI: 0.94 Post exercise: 0.70  Left resting ABI: 0.97 Post exercise: 0.64   He continues on maximal medical therapy.     Past Medical History:  Diagnosis Date   Benign prostatic hyperplasia with urinary obstruction 03/25/2015   Diabetes mellitus without complication (HCC)    Hyperlipidemia    Kidney stone    OSA (obstructive sleep apnea)    Vitamin D deficiency     Past Surgical History:  Procedure Laterality Date   DG ARTHRO THUMB*L*     IR ANGIOGRAM EXTREMITY BILATERAL   02/20/2018   IR ANGIOGRAM EXTREMITY BILATERAL  04/05/2019   IR ANGIOGRAM EXTREMITY LEFT  05/07/2020   IR ANGIOGRAM EXTREMITY RIGHT  02/15/2021   IR ANGIOGRAM SELECTIVE EACH ADDITIONAL VESSEL  02/20/2018   IR FEM POP ART ATHERECT INC PTA MOD SED  02/20/2018   IR FEM POP ART ATHERECT INC PTA MOD SED  05/14/2019   IR FEM POP ART ATHERECT INC PTA MOD SED  05/07/2020   IR FEM POP ART ATHERECT INC PTA MOD SED  02/15/2021   IR INTRAVASCULAR ULTRASOUND NON CORONARY  02/15/2021   IR RADIOLOGIST EVAL & MGMT  02/01/2018   IR RADIOLOGIST EVAL & MGMT  03/20/2018   IR RADIOLOGIST EVAL & MGMT  07/25/2018   IR RADIOLOGIST EVAL & MGMT  03/07/2019   IR RADIOLOGIST EVAL & MGMT  04/23/2019   IR RADIOLOGIST EVAL & MGMT  06/18/2019   IR RADIOLOGIST EVAL & MGMT  04/29/2020   IR RADIOLOGIST EVAL & MGMT  06/04/2020   IR RADIOLOGIST EVAL & MGMT  01/20/2021   IR US GUIDE VASC ACCESS LEFT  04/05/2019   IR US GUIDE VASC ACCESS LEFT  02/15/2021   IR US GUIDE VASC ACCESS RIGHT  02/20/2018   IR US GUIDE VASC ACCESS RIGHT  05/14/2019   IR US GUIDE VASC ACCESS RIGHT  05/07/2020   NECK SURGERY     VASECTOMY      Allergies: Amoxicillin  Medications: Prior to Admission medications   Medication Sig Start Date End Date Taking? Authorizing Provider  AMBULATORY NON FORMULARY MEDICATION One   Touch Ultra 2 glucometer 05/15/20   Thekkekandam, Thomas J, MD  aspirin EC 81 MG tablet Take 81 mg by mouth daily.     [provider]  Blood Glucose Monitoring Suppl (ONE TOUCH ULTRA 2) w/Device KIT DM E11.8. Check fasting blood sugar every morning and 2 hours after largest meal of the day. 05/18/20   Thekkekandam, Thomas J, MD  clobetasol ointment (TEMOVATE) 0.05 % Apply 1 application topically 2 (two) times daily as needed (blisters).    [provider]  clopidogrel (PLAVIX) 75 MG tablet Take 1 tablet (75 mg total) by mouth daily. Begin taking 75 mg Plavix daily on February 10, 2021. 02/02/21 05/03/21  Covington, Jamie R, NP  fenofibrate  160 MG tablet Take 1 tablet (160 mg total) by mouth daily. 01/15/21   Thekkekandam, Thomas J, MD  fluticasone (FLONASE) 50 MCG/ACT nasal spray Place 1 spray into both nostrils daily as needed for allergies or rhinitis.    [provider]  glipiZIDE (GLUCOTROL XL) 5 MG 24 hr tablet Take 1 tablet (5 mg total) by mouth daily with breakfast. 08/19/20   Thekkekandam, Thomas J, MD  glucose blood test strip Use as instructed 01/15/21   Thekkekandam, Thomas J, MD  JANUVIA 50 MG tablet TAKE 1 TABLET BY MOUTH EVERY DAY Patient taking differently: Take 50 mg by mouth daily. 09/16/20   Thekkekandam, Thomas J, MD  lisinopril-hydrochlorothiazide (ZESTORETIC) 20-25 MG tablet TAKE 1 TABLET BY MOUTH EVERY DAY 04/15/21   Thekkekandam, Thomas J, MD  Magnesium 400 MG TABS Take 400 mg by mouth daily.    [provider]  metFORMIN (GLUCOPHAGE) 1000 MG tablet TAKE 1 TABLET (1,000 MG TOTAL) BY MOUTH 2 (TWO) TIMES DAILY WITH A MEAL. 09/02/20   Thekkekandam, Thomas J, MD  omega-3 acid ethyl esters (LOVAZA) 1 g capsule Take 2 capsules (2 g total) by mouth 2 (two) times daily. Patient taking differently: Take 1 g by mouth 3 (three) times daily. 02/18/20   Thekkekandam, Thomas J, MD  OneTouch Delica Lancets 33G MISC Use up to 3 times daily. 03/03/21   Thekkekandam, Thomas J, MD  simvastatin (ZOCOR) 40 MG tablet TAKE 1 TABLET (40 MG TOTAL) BY MOUTH DAILY AT 6 PM. 02/22/21   Thekkekandam, Thomas J, MD  tamsulosin (FLOMAX) 0.4 MG CAPS capsule TAKE 1 CAPSULE BY MOUTH 2 TIMES DAILY. Patient taking differently: Take 0.4 mg by mouth 2 (two) times daily. 11/30/20   Thekkekandam, Thomas J, MD     Family History  Problem Relation Age of Onset   Stroke Mother    Diabetes Mellitus II Mother    Prostate cancer Neg Hx    Kidney cancer Neg Hx    Bladder Cancer Neg Hx     Social History   Socioeconomic History   Marital status: Married    Spouse name: Not on file   Number of children: Not on file   Years of education: Not  on file   Highest education level: Not on file  Occupational History   Not on file  Tobacco Use   Smoking status: Never   Smokeless tobacco: Never  Substance and Sexual Activity   Alcohol use: No   Drug use: No   Sexual activity: Yes    Birth control/protection: None  Other Topics Concern   Not on file  Social History Narrative   Not on file   Social Determinants of Health   Financial Resource Strain: Not on file  Food Insecurity: Not on file  Transportation   Needs: Not on file  Physical Activity: Not on file  Stress: Not on file  Social Connections: Not on file       Review of Systems: A 12 point ROS discussed and pertinent positives are indicated in the HPI above.  All other systems are negative.  Review of Systems  Vital Signs: There were no vitals taken for this visit.  Physical Exam  Targeted exam of the feet. No discoloration of the left or right foot.  No ulceration.  Warm and well perfused.  Right AT and PT +1 palpable Left AT and PT +1 palpable  Imaging: No results found.  Labs:  CBC: Recent Labs    05/07/20 0759 08/19/20 0000 02/15/21 0640  WBC 6.2 5.4 6.4  HGB 13.5 14.3 12.5*  HCT 42.1 42.1 37.6*  PLT 330 337 349    COAGS: Recent Labs    05/07/20 0759 02/15/21 0640  INR 1.0 1.0    BMP: Recent Labs    05/07/20 0759 08/19/20 0000 02/15/21 0640  NA 137 139 139  K 4.4 4.9 4.6  CL 106 103 107  CO2 20* 27 22  GLUCOSE 134* 151* 100*  BUN 25* 18 44*  CALCIUM 9.3 10.1 10.5*  CREATININE 1.35* 1.11 1.73*  GFRNONAA 51*  --  40*  GFRAA 59*  --   --     LIVER FUNCTION TESTS: Recent Labs    08/19/20 0000  BILITOT 0.4  AST 22  ALT 29  PROT 7.3    TUMOR MARKERS: No results for input(s): AFPTM, CEA, CA199, CHROMGRNA in the last 8760 hours.  Assessment and Plan:  Assessment:  Michael Burright is a 75yo male with known PAD, bilateral femoral-popliteal disease, treated recently for life-style limiting right Rutherford 3 claudication.     He also has history of prior left SFA treatment, as early as 1 year ago September, now with recurrent symptoms of lifestyle limiting claudication, Rutherford 2, and his segmental exam supports early recurrent left SFA stenosis.        I had a discussion with Michael Petitjean regarding treatment strategies which might possibly include just observation and medical management/walking program, repeat endovascular therapy, surgical strategy.  He is someone who is very active, walking on the treadmill at least 3 times a week, and in the neighborhood with his wife.  His desire is to be symptom free.   I did let him know that should we treat him again with endovascular option, a stent would be the likely solution, as he has now had 2 prior DEB on both right and left.  Stent might in his severely calcified segment, have more durability.    He does wish to have a surgical consultation regarding possible bypass, as he knows his family (I believe cousin), has had good durability with prior CABG. I did let him know that the vascular beds have different anatomy with different treatment strategies, but that the TASC guidelines do support consideration of possible bypass option in a patient with 2 failed treatments for TASC A/B anatomic disease.   Maximal medical therapy for reduction of risk factors continues to be indicated as recommended by updated AHA guidelines1.  This includes anti-platelet medication, tight blood glucose control to a HbA1c < 7, tight blood pressure control, maximum-dose HMG-CoA reductase inhibitor.  He already does a good job with exercise/physical activity.  Finally, I did let him know that I feel run-off cross-sectional imaging is a good idea, given that we have seen his post-exercise ABI  decrease bilaterally, on the right in unexpectedly short timeframe.   Plan: - Cross-sectional anatomic imaging with MRA run-off - He would like to have a consult to discuss surgical bypass option.  -   Continue maximal medical therapy for cardiovascular risk reduction, including anti-platelet therapy. - We are happy to follow up with him if he desires an endovascular option and/or surveillance.    ___________________________________________________________________   1- Gerhard-Herman MD, et al. 2016 AHA/ACC Guideline on the Management of Patients With Lower Extremity Peripheral Artery Disease: Executive Summary: A Report of the American College of Cardiology/American Heart Association Task Force on Clinical Practice Guidelines. J Am Coll Cardiol. 2017 Mar 21;69(11):1465-1508. doi: 10.1016/j.jacc.2016.11.008.   2 - Norgren L, et al. TASC II Working Group. Inter-society consensus for the management of peripheral arterial disease. Int Angiol. 2007 Jun;26(2):81-157. Review. PubMed PMID: 17489079  3 - Hingorani A, et al. The management of diabetic foot: A clinical practice guideline by the Society for Vascular Surgery in collaboration with the American Podiatric Medical Association and the Society  for Vascular Medicine. J Vasc Surg. 2016 Feb;63(2 Suppl):3S-21S. doi: 10.1016/j.jvs.2015.10.003. PubMed PMID: 26804367.  4 - Mustapha JA, Saab FA, Martinsen BJ, Pena CS, Zeller T, Driver VR, Neville RF, Lookstein R, van den Berg JC, Jaff Michael, Michael P, Henao S, AlMahameed A, Katzen B. Digital Subtraction Angiography Prior to an Amputation for Critical Limb Ischemia (CLI): An Expert Recommendation Statement From the CLI Global Society to Optimize Limb Salvage. J Endovasc Ther. 2020 Aug;27(4):540-546. doi: 10.1177/1526602820928590. Epub 2020 May 29. PMID: 32469294.    Electronically Signed: Jaime Wagner 04/27/2021, 12:19 PM   I spent a total of    40 Minutes in face to face in clinical consultation, greater than 50% of which was counseling/coordinating care for bilateral lower extremity PAD, recurrent left sided claudication and SFA disease.   

## 2021-04-27 NOTE — Telephone Encounter (Signed)
-----   Message from Corrie Mckusick, DO sent at 04/27/2021  3:20 PM EDT ----- Dr. Darene Lamer, good day.   Was reaching out about Mr Packett today, as we saw him back in the clinic for follow up .  He is having recurrent left leg claudication.  We discussed his fairly aggressive phenotype of SFA disease (hes been treated bilateral SFA x 2), probably because of the degree of calcification.   I did let him know that I am happy to treat him again with the next step, which would be metal stenting.  Of course being thoughtful as he is, he requested vascular surgical consultation for consideration of bypass.  I told him that is very reasonable, and though often they treat TASC A/B with endo first, totally reasonable request.   I did ask him out of courtesy to the [facility] group if he wished to go back there, and he prefers not, instead to see the guys here in [place] with [facility].   Please let me know if I can help get him referred for consult.    I will plan on following up with him after consultation.   Thanks again.   Corrie Mckusick [phone]

## 2021-04-28 ENCOUNTER — Other Ambulatory Visit: Payer: Self-pay | Admitting: Interventional Radiology

## 2021-04-28 DIAGNOSIS — I739 Peripheral vascular disease, unspecified: Secondary | ICD-10-CM

## 2021-04-29 ENCOUNTER — Other Ambulatory Visit: Payer: Self-pay | Admitting: Interventional Radiology

## 2021-04-29 DIAGNOSIS — I739 Peripheral vascular disease, unspecified: Secondary | ICD-10-CM

## 2021-05-05 ENCOUNTER — Other Ambulatory Visit: Payer: Self-pay

## 2021-05-05 ENCOUNTER — Other Ambulatory Visit: Payer: Self-pay | Admitting: Sports Medicine

## 2021-05-05 DIAGNOSIS — E785 Hyperlipidemia, unspecified: Secondary | ICD-10-CM

## 2021-05-05 MED ORDER — OMEGA-3-ACID ETHYL ESTERS 1 G PO CAPS: 2.0000 g | ORAL_CAPSULE | Freq: Two times a day (BID) | ORAL | 3 refills | Status: DC

## 2021-05-05 NOTE — Progress Notes (Signed)
Pt called and requested refill of Lovaza, sent to [facility] in [place]. Refill pended.

## 2021-05-12 ENCOUNTER — Other Ambulatory Visit: Payer: Self-pay

## 2021-05-12 ENCOUNTER — Ambulatory Visit (HOSPITAL_COMMUNITY)
Admission: RE | Admit: 2021-05-12 | Discharge: 2021-05-12 | Disposition: A | Discharge status: Home / Self Care | Payer: HMO | Source: Ambulatory Visit | Attending: Interventional Radiology | Admitting: Interventional Radiology

## 2021-05-12 DIAGNOSIS — I739 Peripheral vascular disease, unspecified: Secondary | ICD-10-CM

## 2021-05-12 DIAGNOSIS — I701 Atherosclerosis of renal artery: Secondary | ICD-10-CM | POA: Diagnosis not present

## 2021-05-12 DIAGNOSIS — I708 Atherosclerosis of other arteries: Secondary | ICD-10-CM | POA: Diagnosis not present

## 2021-05-12 DIAGNOSIS — I700 Atherosclerosis of aorta: Secondary | ICD-10-CM | POA: Insufficient documentation

## 2021-05-12 DIAGNOSIS — I70211 Atherosclerosis of native arteries of extremities with intermittent claudication, right leg: Secondary | ICD-10-CM | POA: Diagnosis not present

## 2021-05-12 DIAGNOSIS — I70212 Atherosclerosis of native arteries of extremities with intermittent claudication, left leg: Secondary | ICD-10-CM | POA: Diagnosis not present

## 2021-05-12 MED ORDER — GADOBUTROL 1 MMOL/ML IV SOLN
6.0000 mL | Freq: Once | INTRAVENOUS | Status: AC | PRN
  Administered 2021-05-12: 6 mL via INTRAVENOUS

## 2021-05-23 NOTE — Progress Notes (Signed)
[facility] [place]  ASSESSMENT / PLAN: Michael Thomas is a 75 y.o. male with atherosclerosis of native arteries of bilateral lower extremities causing intermittent claudication.  Recommend the following which can slow the progression of atherosclerosis and reduce the risk of major adverse cardiac / limb events:  Complete cessation from all tobacco products. Blood glucose control with goal A1c < 7%. Blood pressure control with goal blood pressure < 140/90 mmHg. Lipid reduction therapy with goal LDL-C <100 mg/dL (<70 if symptomatic from PAD).  Aspirin 48m PO QD.  Atorvastatin 40-80mg PO QD (or other "high intensity" statin therapy).  I had a long discussion with Michael Thomas the natural history of intermittent claudication, endovascular intervention for claudication, and surgical therapy for claudication.  I think his expectations for symptomatic relief are beyond what we can reasonably achieve with modern endovascular technology (he wants to be able to run uphill etc.).  I would not recommend surgical therapy at this time given the presence of palpable pedal pulses and is relatively mild symptoms.  He should continue best medical therapy and exercise therapy. Will discuss with Dr. WEarleen Newport  CHIEF COMPLAINT: Claudication  HISTORY OF PRESENT ILLNESS: Michael Thomas a 75y.o. male referred from Dr. WEarleen Newportfor discussion of surgical options for intermittent claudication in bilateral lower extremities.  The patient is undergone multiple endovascular interventions for lifestyle limiting claudication by Dr. WEarleen Newport  He reports good symptom relief in the short-term after these interventions, but gradual return to baseline level of discomfort afterwards.  He has a good amount of exercise tolerance from my perspective.  He reports he can walk on a flat surface for greater than a mile.  He has difficulty on incline surfaces.  He has difficulty jogging.  Past  Medical History:  Diagnosis Date   Benign prostatic hyperplasia with urinary obstruction 03/25/2015   Diabetes mellitus without complication (HCC)    Hyperlipidemia    Kidney stone    OSA (obstructive sleep apnea)    Vitamin D deficiency     Past Surgical History:  Procedure Laterality Date   DG ARTHRO THUMB*L*     IR ANGIOGRAM EXTREMITY BILATERAL  02/20/2018   IR ANGIOGRAM EXTREMITY BILATERAL  04/05/2019   IR ANGIOGRAM EXTREMITY LEFT  05/07/2020   IR ANGIOGRAM EXTREMITY RIGHT  02/15/2021   IR ANGIOGRAM SELECTIVE EACH ADDITIONAL VESSEL  02/20/2018   IR FEM POP ART ATHERECT INC PTA MOD SED  02/20/2018   IR FEM POP ART ATHERECT INC PTA MOD SED  05/14/2019   IR FEM POP ART ATHERECT INC PTA MOD SED  05/07/2020   IR FEM POP ART ATHERECT INC PTA MOD SED  02/15/2021   IR INTRAVASCULAR ULTRASOUND NON CORONARY  02/15/2021   IR RADIOLOGIST EVAL & MGMT  02/01/2018   IR RADIOLOGIST EVAL & MGMT  03/20/2018   IR RADIOLOGIST EVAL & MGMT  07/25/2018   IR RADIOLOGIST EVAL & MGMT  03/07/2019   IR RADIOLOGIST EVAL & MGMT  04/23/2019   IR RADIOLOGIST EVAL & MGMT  06/18/2019   IR RADIOLOGIST EVAL & MGMT  04/29/2020   IR RADIOLOGIST EVAL & MGMT  06/04/2020   IR RADIOLOGIST EVAL & MGMT  01/20/2021   IR RADIOLOGIST EVAL & MGMT  04/27/2021   IR [place] VASC ACCESS LEFT  04/05/2019   IR [place] VASC ACCESS LEFT  02/15/2021   IR [place] VASC ACCESS RIGHT  02/20/2018   IR [place] VASC ACCESS RIGHT  05/14/2019   IR US GUIDE VASC ACCESS RIGHT  05/07/2020   NECK SURGERY     VASECTOMY      Family History  Problem Relation Age of Onset   Stroke Mother    Diabetes Mellitus II Mother    Prostate cancer Neg Hx    Kidney cancer Neg Hx    Bladder Cancer Neg Hx     Social History   Socioeconomic History   Marital status: Married    Spouse name: Not on file   Number of children: Not on file   Years of education: Not on file   Highest education level: Not on file  Occupational History   Not on file  Tobacco Use    Smoking status: Never   Smokeless tobacco: Never  Vaping Use   Vaping Use: Never used  Substance and Sexual Activity   Alcohol use: No   Drug use: No   Sexual activity: Yes    Birth control/protection: None  Other Topics Concern   Not on file  Social History Narrative   Not on file   Social Determinants of Health   Financial Resource Strain: Not on file  Food Insecurity: Not on file  Transportation Needs: Not on file  Physical Activity: Not on file  Stress: Not on file  Social Connections: Not on file  Intimate Partner Violence: Not on file    Allergies  Allergen Reactions   Amoxicillin Other (See Comments)    Fatigue and Myalgia and Rigors x2 attempts    Current Outpatient Medications  Medication Sig Dispense Refill   AMBULATORY NON FORMULARY MEDICATION One Touch Ultra 2 glucometer 1 each 0   aspirin EC 81 MG tablet Take 81 mg by mouth daily.      Blood Glucose Monitoring Suppl (ONE TOUCH ULTRA 2) w/Device KIT DM E11.8. Check fasting blood sugar every morning and 2 hours after largest meal of the day. 1 kit prn   clobetasol ointment (TEMOVATE) 4.00 % Apply 1 application topically 2 (two) times daily as needed (blisters).     fenofibrate 160 MG tablet Take 1 tablet (160 mg total) by mouth daily. 90 tablet 3   glipiZIDE (GLUCOTROL XL) 5 MG 24 hr tablet Take 1 tablet (5 mg total) by mouth daily with breakfast. 90 tablet 3   glucose blood test strip Use as instructed 100 each 12   JANUVIA 50 MG tablet TAKE 1 TABLET BY MOUTH EVERY DAY (Patient taking differently: Take 50 mg by mouth daily.) 90 tablet 3   lisinopril-hydrochlorothiazide (ZESTORETIC) 20-25 MG tablet TAKE 1 TABLET BY MOUTH EVERY DAY 90 tablet 0   Magnesium 400 MG TABS Take 400 mg by mouth daily.     metFORMIN (GLUCOPHAGE) 1000 MG tablet TAKE 1 TABLET (1,000 MG TOTAL) BY MOUTH 2 (TWO) TIMES DAILY WITH A MEAL. 180 tablet 1   omega-3 acid ethyl esters (LOVAZA) 1 g capsule Take 2 capsules (2 g total) by mouth 2 (two)  times daily. 360 capsule 3   OneTouch Delica Lancets 86P MISC Use up to 3 times daily. 100 each 11   simvastatin (ZOCOR) 40 MG tablet TAKE 1 TABLET (40 MG TOTAL) BY MOUTH DAILY AT 6 PM. 90 tablet 3   tamsulosin (FLOMAX) 0.4 MG CAPS capsule TAKE 1 CAPSULE BY MOUTH 2 TIMES DAILY. (Patient taking differently: Take 0.4 mg by mouth 2 (two) times daily.) 180 capsule 1   No current facility-administered medications for this visit.    REVIEW OF SYSTEMS:  '[X]'   denotes positive finding, '[ ]'  denotes negative finding Cardiac  Comments:  Chest pain or chest pressure:    Shortness of breath upon exertion:    Short of breath when lying flat:    Irregular heart rhythm:        Vascular    Pain in calf, thigh, or hip brought on by ambulation:    Pain in feet at night that wakes you up from your sleep:     Blood clot in your veins:    Leg swelling:         Pulmonary    Oxygen at home:    Productive cough:     Wheezing:         Neurologic    Sudden weakness in arms or legs:     Sudden numbness in arms or legs:     Sudden onset of difficulty speaking or slurred speech:    Temporary loss of vision in one eye:     Problems with dizziness:         Gastrointestinal    Blood in stool:     Vomited blood:         Genitourinary    Burning when urinating:     Blood in urine:        Psychiatric    Major depression:         Hematologic    Bleeding problems:    Problems with blood clotting too easily:        Skin    Rashes or ulcers:        Constitutional    Fever or chills:      PHYSICAL EXAM Vitals:   05/25/21 1349  BP: 135/72  Pulse: 61  Resp: 20  Temp: 98.3 F (36.8 C)  SpO2: 100%  Weight: 135 lb 12.8 oz (61.6 kg)  Height: '5\' 1"'  (1.549 m)    Constitutional: well appearing. no distress. Appears well nourished.  Neurologic: CN intact. no focal findings. no sensory loss. Psychiatric:  Mood and affect symmetric and appropriate. Eyes:  No icterus. No conjunctival pallor. Ears,  nose, throat:  mucous membranes moist. Midline trachea.  Cardiac: regular rate and rhythm.  Respiratory:  unlabored. Abdominal:  soft, non-tender, non-distended.  Peripheral vascular: 1+ L DP / PT pulse. 2+ R DP / PT pulse Extremity: no edema. no cyanosis. no pallor.  Skin: no gangrene. no ulceration.  Lymphatic: no Stemmer's sign. no palpable lymphadenopathy.  PERTINENT LABORATORY AND RADIOLOGIC DATA  Most recent CBC CBC Latest Ref Rng & Units 02/15/2021 08/19/2020 05/07/2020  WBC 4.0 - 10.5 K/uL 6.4 5.4 6.2  Hemoglobin 13.0 - 17.0 g/dL 12.5(L) 14.3 13.5  Hematocrit 39.0 - 52.0 % 37.6(L) 42.1 42.1  Platelets 150 - 400 K/uL 349 337 330     Most recent CMP CMP Latest Ref Rng & Units 02/15/2021 08/19/2020 05/07/2020  Glucose 70 - 99 mg/dL 100(H) 151(H) 134(H)  BUN 8 - 23 mg/dL 44(H) 18 25(H)  Creatinine 0.61 - 1.24 mg/dL 1.73(H) 1.11 1.35(H)  Sodium 135 - 145 mmol/L 139 139 137  Potassium 3.5 - 5.1 mmol/L 4.6 4.9 4.4  Chloride 98 - 111 mmol/L 107 103 106  CO2 22 - 32 mmol/L 22 27 20(L)  Calcium 8.9 - 10.3 mg/dL 10.5(H) 10.1 9.3  Total Protein 6.1 - 8.1 g/dL - 7.3 -  Total Bilirubin 0.2 - 1.2 mg/dL - 0.4 -  Alkaline Phos 40 - 115 U/L - - -  AST 10 - 35 U/L -  22 -  ALT 9 - 46 U/L - 29 -    Renal function CrCl cannot be calculated (Patient's most recent lab result is older than the maximum 21 days allowed.).  HbA1c, POC (controlled diabetic range) (%)  Date Value  03/03/2021 7.5 (A)    LDL Cholesterol (Calc)  Date Value Ref Range Status  08/19/2020 71 mg/dL (calc) Final    Comment:    Reference range: <100 . Desirable range <100 mg/dL for primary prevention;   <70 mg/dL for patients with CHD or diabetic patients  with > or = 2 CHD risk factors. Marland Kitchen LDL-C is now calculated using the Martin-Hopkins  calculation, which is a validated novel method providing  better accuracy than the Friedewald equation in the  estimation of LDL-C.  Cresenciano Genre et al. Annamaria Helling. 1660;630(16):  2061-2068  (http://education.[employer].com/faq/FAQ164)    Direct LDL  Date Value Ref Range Status  07/25/2018 58 <100 mg/dL Final    Comment:    . Desirable range <100 mg/dL for primary prevention;   <70 mg/dL for patients with CHD or diabetic patients  with > or = 2 CHD risk factors. Marland Kitchen      CLINICAL DATA:  75 year old male with a history lower extremity claudication, treated 04/18/2021 for symptomatic, recurrent high-grade right SFA stenosis, lifestyle limiting Rutherford 3 category symptoms.   EXAM: NONINVASIVE PHYSIOLOGIC VASCULAR STUDY OF BILATERAL LOWER EXTREMITIES   TECHNIQUE: Non-invasive vascular evaluation of both lower extremities was performed at rest, including calculation of ankle-brachial indices, multiple segmental pressure evaluation, segmental Doppler and segmental pulse volume recording.   COMPARISON:  None.   FINDINGS: Right:   Resting ankle brachial index:  0.94   Post exercise ABI: 0.70   Segmental blood pressure: Upper extremity pressures are symmetric, with appropriate increased to the thigh. There is a 30 point systolic drop from the high thigh to the calf. Digital pressure 010 systolic   Doppler: Segmental Doppler demonstrates triphasic waveform of the femoropopliteal segment. Biphasic posterior tibial artery and dorsalis pedis.   Pulse volume recording: Segmental PVR maintained throughout including augmentation below knee   Left:   Resting ankle brachial index: 0.97   Post exercise ABI: 0.64   Segmental blood pressure: Upper extremity pressures are symmetric. Appropriate increased to the thigh. No significant drop between segments. Digital pressure measures 932 systolic   Doppler: Segmental Doppler demonstrates triphasic waveforms of the femoropopliteal segment. Biphasic dorsalis pedis and posterior tibial arteries   Pulse volume recording: Segmental PVR maintained throughout including augmentation below knee.    Additional:   IMPRESSION: Resting ABI the bilateral lower extremity within normal limits, with post exercise ABI falling into the moderate range arterial occlusive disease bilaterally.   Segmental exam indicates recurrent/developing bilateral femoropopliteal stenoses.   Signed,   Dulcy Fanny. Dellia Nims, RPVI   [facility]   [facility]     Electronically Signed   By: Corrie Mckusick D.O.   On: 04/27/2021 14:12  Yevonne Aline. Stanford Breed, MD [facility] Phone Number: [phone] 05/25/2021 5:33 PM  Total time spent on preparing this encounter including chart review, data review, collecting history, examining the patient, coordinating care for this new patient, 60 minutes.  Portions of this report may have been transcribed using voice recognition software.  Every effort has been made to ensure accuracy; however, inadvertent computerized transcription errors may still be present.

## 2021-05-25 ENCOUNTER — Other Ambulatory Visit: Payer: Self-pay

## 2021-05-25 ENCOUNTER — Ambulatory Visit: Payer: HMO | Admitting: Vascular Surgery

## 2021-05-25 ENCOUNTER — Encounter: Payer: Self-pay | Admitting: Vascular Surgery

## 2021-05-25 VITALS — BP 135/72 | HR 61 | Temp 98.3°F | Resp 20 | Ht 61.0 in | Wt 135.8 lb

## 2021-05-25 DIAGNOSIS — I739 Peripheral vascular disease, unspecified: Secondary | ICD-10-CM | POA: Diagnosis not present

## 2021-05-27 ENCOUNTER — Other Ambulatory Visit: Payer: HMO

## 2021-06-16 DIAGNOSIS — G4733 Obstructive sleep apnea (adult) (pediatric): Secondary | ICD-10-CM | POA: Diagnosis not present

## 2021-06-16 DIAGNOSIS — I10 Essential (primary) hypertension: Secondary | ICD-10-CM | POA: Diagnosis not present

## 2021-07-16 DIAGNOSIS — I10 Essential (primary) hypertension: Secondary | ICD-10-CM | POA: Diagnosis not present

## 2021-07-16 DIAGNOSIS — G4733 Obstructive sleep apnea (adult) (pediatric): Secondary | ICD-10-CM | POA: Diagnosis not present

## 2021-07-20 ENCOUNTER — Ambulatory Visit: Payer: HMO | Admitting: Sports Medicine

## 2021-07-22 ENCOUNTER — Other Ambulatory Visit: Payer: Self-pay | Admitting: Sports Medicine

## 2021-07-22 DIAGNOSIS — N138 Other obstructive and reflux uropathy: Secondary | ICD-10-CM

## 2021-07-27 ENCOUNTER — Other Ambulatory Visit: Payer: Self-pay | Admitting: Interventional Radiology

## 2021-07-27 DIAGNOSIS — Z8679 Personal history of other diseases of the circulatory system: Secondary | ICD-10-CM

## 2021-08-10 ENCOUNTER — Other Ambulatory Visit: Payer: Self-pay | Admitting: Sports Medicine

## 2021-08-10 DIAGNOSIS — E118 Type 2 diabetes mellitus with unspecified complications: Secondary | ICD-10-CM

## 2021-08-12 ENCOUNTER — Ambulatory Visit
Admission: RE | Admit: 2021-08-12 | Discharge: 2021-08-12 | Disposition: A | Discharge status: Home / Self Care | Payer: HMO | Source: Ambulatory Visit | Attending: Interventional Radiology | Admitting: Interventional Radiology

## 2021-08-12 ENCOUNTER — Encounter: Payer: Self-pay | Admitting: *Deleted

## 2021-08-12 DIAGNOSIS — I70213 Atherosclerosis of native arteries of extremities with intermittent claudication, bilateral legs: Secondary | ICD-10-CM | POA: Diagnosis not present

## 2021-08-12 DIAGNOSIS — Z8679 Personal history of other diseases of the circulatory system: Secondary | ICD-10-CM

## 2021-08-12 HISTORY — PX: IR RADIOLOGIST EVAL & MGMT: IMG5224

## 2021-08-12 NOTE — Progress Notes (Signed)
Chief Complaint: PAD   Referring Physician(s): Dr. Dianah Field    History of Present Illness: Michael Thomas is a 76 y.o. male presenting today for a scheduled follow up visit to [place], with history of bilateral femoral-popliteal disease, treated most recently 02/15/21 for recurrent right fem-pop stenosis.     Michael Thomas joins [place] today in the clinic by himself.    History: We first met Michael Thomas 02/01/2018, for complaints of left sided claudication.  He had an opinion closer to his home in [place], but sought second opinion with ongoing symptoms after MRA demonstrated femoral-popliteal etiology.    Treatment history has been: 02/20/18: Left SFA directional atherectomy and DEB 05/14/2019: Right SFA directional atherectomy and DEB 05/07/20: Left SFA laser atherectomy and DEB for recurrent stenosis 02/15/21: Right SFA directional atherectomy and DEB for recurrent stenosis   Interval history: When we last saw Michael. Thomas 04/27/21, he was concerned with symptoms of claudication of his left>right leg while walking uphill on the treadmill.  At the time, he told me that his brother has undergone CABG bypass, and that has served him very well.   He requested a consult for bypass, and he was referred for a consult with our colleagues in Vascular Surgery.   A formal bypass is not recommended for his symptoms.    Today Michael Thomas tells me that he is still having left > right calf cramping.   This occurs when he is walking on a treadmill with a significant uphill effort.  He denies symptoms of claudication walking on the treadmill on level platform, or at the store or around the house.  No symptoms of resting pain.    ABI 01/20/2021 Right resting ABI: 0.78 Post exercise: 0.32   Left resting ABI: 0.93 Post exercise: 0.90   Updated ABI 04/27/21 Right resting ABI: 0.94 Post exercise: 0.70   Left resting ABI: 0.97 Post exercise: 0.64   He continues on maximal medical therapy.   Past  Medical History:  Diagnosis Date   Benign prostatic hyperplasia with urinary obstruction 03/25/2015   Diabetes mellitus without complication (HCC)    Hyperlipidemia    Kidney stone    OSA (obstructive sleep apnea)    Vitamin D deficiency     Past Surgical History:  Procedure Laterality Date   DG ARTHRO THUMB*L*     IR ANGIOGRAM EXTREMITY BILATERAL  02/20/2018   IR ANGIOGRAM EXTREMITY BILATERAL  04/05/2019   IR ANGIOGRAM EXTREMITY LEFT  05/07/2020   IR ANGIOGRAM EXTREMITY RIGHT  02/15/2021   IR ANGIOGRAM SELECTIVE EACH ADDITIONAL VESSEL  02/20/2018   IR FEM POP ART ATHERECT INC PTA MOD SED  02/20/2018   IR FEM POP ART ATHERECT INC PTA MOD SED  05/14/2019   IR FEM POP ART ATHERECT INC PTA MOD SED  05/07/2020   IR FEM POP ART ATHERECT INC PTA MOD SED  02/15/2021   IR INTRAVASCULAR ULTRASOUND NON CORONARY  02/15/2021   IR RADIOLOGIST EVAL & MGMT  02/01/2018   IR RADIOLOGIST EVAL & MGMT  03/20/2018   IR RADIOLOGIST EVAL & MGMT  07/25/2018   IR RADIOLOGIST EVAL & MGMT  03/07/2019   IR RADIOLOGIST EVAL & MGMT  04/23/2019   IR RADIOLOGIST EVAL & MGMT  06/18/2019   IR RADIOLOGIST EVAL & MGMT  04/29/2020   IR RADIOLOGIST EVAL & MGMT  06/04/2020   IR RADIOLOGIST EVAL & MGMT  01/20/2021   IR RADIOLOGIST EVAL & MGMT  04/27/2021   IR US GUIDE  VASC ACCESS LEFT  04/05/2019   IR US GUIDE VASC ACCESS LEFT  02/15/2021   IR US GUIDE VASC ACCESS RIGHT  02/20/2018   IR US GUIDE VASC ACCESS RIGHT  05/14/2019   IR US GUIDE VASC ACCESS RIGHT  05/07/2020   NECK SURGERY     VASECTOMY      Allergies: Amoxicillin  Medications: Prior to Admission medications   Medication Sig Start Date End Date Taking? Authorizing Provider  AMBULATORY NON FORMULARY MEDICATION One Touch Ultra 2 glucometer 05/15/20   Silverio Decamp, MD  aspirin EC 81 MG tablet Take 81 mg by mouth daily.     [provider]  Blood Glucose Monitoring Suppl (ONE TOUCH ULTRA 2) w/Device KIT DM E11.8. Check fasting blood sugar every morning  and 2 hours after largest meal of the day. 05/18/20   Silverio Decamp, MD  clobetasol ointment (TEMOVATE) 0.86 % Apply 1 application topically 2 (two) times daily as needed (blisters).    [provider]  fenofibrate 160 MG tablet Take 1 tablet (160 mg total) by mouth daily. 01/15/21   Silverio Decamp, MD  glipiZIDE (GLUCOTROL XL) 5 MG 24 hr tablet TAKE 1 TABLET BY MOUTH EVERY DAY WITH BREAKFAST 08/10/21   Silverio Decamp, MD  glucose blood test strip Use as instructed 01/15/21   Silverio Decamp, MD  JANUVIA 50 MG tablet TAKE 1 TABLET BY MOUTH EVERY DAY Patient taking differently: Take 50 mg by mouth daily. 09/16/20   Silverio Decamp, MD  lisinopril-hydrochlorothiazide (ZESTORETIC) 20-25 MG tablet TAKE 1 TABLET BY MOUTH EVERY DAY 04/15/21   Silverio Decamp, MD  Magnesium 400 MG TABS Take 400 mg by mouth daily.    [provider]  metFORMIN (GLUCOPHAGE) 1000 MG tablet TAKE 1 TABLET (1,000 MG TOTAL) BY MOUTH 2 (TWO) TIMES DAILY WITH A MEAL. 09/02/20   Silverio Decamp, MD  omega-3 acid ethyl esters (LOVAZA) 1 g capsule Take 2 capsules (2 g total) by mouth 2 (two) times daily. 05/05/21   Silverio Decamp, MD  OneTouch Delica Lancets 57Q MISC Use up to 3 times daily. 03/03/21   Silverio Decamp, MD  simvastatin (ZOCOR) 40 MG tablet TAKE 1 TABLET (40 MG TOTAL) BY MOUTH DAILY AT 6 PM. 02/22/21   Silverio Decamp, MD  tamsulosin (FLOMAX) 0.4 MG CAPS capsule TAKE 1 CAPSULE BY MOUTH TWICE A DAY 07/22/21   Silverio Decamp, MD     Family History  Problem Relation Age of Onset   Stroke Mother    Diabetes Mellitus II Mother    Prostate cancer Neg Hx    Kidney cancer Neg Hx    Bladder Cancer Neg Hx     Social History   Socioeconomic History   Marital status: Married    Spouse name: Not on file   Number of children: Not on file   Years of education: Not on file   Highest education level: Not on file  Occupational History    Not on file  Tobacco Use   Smoking status: Never   Smokeless tobacco: Never  Vaping Use   Vaping Use: Never used  Substance and Sexual Activity   Alcohol use: No   Drug use: No   Sexual activity: Yes    Birth control/protection: None  Other Topics Concern   Not on file  Social History Narrative   Not on file   Social Determinants of Health   Financial Resource Strain: Not on  file  Food Insecurity: Not on file  Transportation Needs: Not on file  Physical Activity: Not on file  Stress: Not on file  Social Connections: Not on file      Review of Systems: A 12 point ROS discussed and pertinent positives are indicated in the HPI above.  All other systems are negative.  Review of Systems  Vital Signs: BP (!) 111/59 (BP Location: Left Arm)    Pulse 73    SpO2 98%   Physical Exam General: 76 yo male appearing stated age.  Well-developed, well-nourished.  No distress. HEENT: Atraumatic, normocephalic.  Conjugate gaze, extra-ocular motor intact. No scleral icterus or scleral injection. No lesions on external ears, nose  Neck: Symmetric with no goiter enlargement.  Chest/Lungs:  Symmetric chest with inspiration/expiration.  No labored breathing.     Heart:    No JVD appreciated.  Abdomen:  Soft, NT/ND, with + bowel sounds.   Genito-urinary: Deferred Neurologic: Alert & Oriented to person, place, and time.   Normal affect and insight.  Appropriate questions.  Moving all 4 extremities with gross sensory intact.  Pulse Exam:  palpable left and right PT and DP pulses.  No wounds on the lower extremity.  No trophic changes.     Mallampati Score:     Imaging: No results found.  Labs:  CBC: Recent Labs    08/19/20 0000 02/15/21 0640  WBC 5.4 6.4  HGB 14.3 12.5*  HCT 42.1 37.6*  PLT 337 349    COAGS: Recent Labs    02/15/21 0640  INR 1.0    BMP: Recent Labs    08/19/20 0000 02/15/21 0640  NA 139 139  K 4.9 4.6  CL 103 107  CO2 27 22  GLUCOSE 151* 100*   BUN 18 44*  CALCIUM 10.1 10.5*  CREATININE 1.11 1.73*  GFRNONAA  --  40*    LIVER FUNCTION TESTS: Recent Labs    08/19/20 0000  BILITOT 0.4  AST 22  ALT 29  PROT 7.3    TUMOR MARKERS: No results for input(s): AFPTM, CEA, CA199, CHROMGRNA in the last 8760 hours.  Assessment and Plan:  Assessment:  Michael Mcmahill is a 76yo male with known PAD, bilateral femoral-popliteal disease, with bilateral Rutherford 1 class symptoms of claudication (only present with uphill effort on treadmill)    Today I had a discussion with Michael Hagood regarding further treatment strategies, including continued medical management, and the limited role for intervention/revascularization in the setting of his mild symptoms.     He remains very active, walking on the treadmill at least 3 times a week, and in the neighborhood with his wife.      We discussed continuing maximal medical therapy at this time for baseline therapy/reduction of risk factors as recommended by updated AHA guidelines1.  This includes anti-platelet medication, tight blood glucose control to a HbA1c < 7, tight blood pressure control, maximum-dose HMG-CoA reductase inhibitor.  He already does a good job with exercise/physical activity.   We also discussed pletal/cilostazol trial.  I discussed 161m BID, with possible side effects to include mainly headache, dizziness, and/or GI upset/diarrhea.  We will give this a trial for 3 months and reassess.  Plan: - We will trial 3 months of cilostazol, 1029mBID.  - Follow up visit in 3 months to discuss.  - continue maximal medical therapy for cardiovascular risk reduction, including anti-platelet therapy.  ___________________________________________________________________   1- Morley KosD, et al. 2016 AHA/ACC Guideline on the Management of  Patients With Lower Extremity Peripheral Artery Disease: Executive Summary: A Report of the [employer] of Cardiology/American Heart Association Task  Force on Clinical Practice Guidelines. J Am Coll Cardiol. 2017 Mar 21;69(11):1465-1508. doi: 10.1016/j.jacc.2016.11.008.   2 - Norgren L, et al. TASC II Working Group. Inter-society consensus for the management of peripheral arterial disease. Int Tressia Miners. 2007 Jun;26(2):81-157. Review. PubMed PMID: 72257505  3 - Hingorani A, et al. The management of diabetic foot: A clinical practice guideline by the Society for Vascular Surgery in collaboration with the [place] and the Society  for Vascular Medicine. J Vasc Surg. 2016 Feb;63(2 Suppl):3S-21S. doi: 10.1016/j.jvs.2015.10.003. PubMed PMID: 18335825.  4 - Corinna Gab, Saab FA, Luberta Mutter, Grant Ruts, Ewell Poe, Driver VR, [place], Lookstein R, van den Baldemar Lenis, Jaff Michael, Guadalupe Dawn, Henao S, AlMahameed A, Katzen B. Digital Subtraction Angiography Prior to an Amputation for Critical Limb Ischemia (CLI): An Expert Recommendation Statement From the CLI Global Society to Optimize Limb Salvage. J Endovasc Ther. 2020 Aug;27(4):540-546. doi: 10.1177/1526602820928590. Epub 2020 May 29. PMID: 18984210.      Electronically Signed: Corrie Mckusick 08/12/2021, 12:04 PM   I spent a total of    25 Minutes in face to face in clinical consultation, greater than 50% of which was counseling/coordinating care for Rutherford 1 class symptoms of PAD, further management

## 2021-08-14 ENCOUNTER — Other Ambulatory Visit: Payer: Self-pay | Admitting: Sports Medicine

## 2021-08-14 DIAGNOSIS — I10 Essential (primary) hypertension: Secondary | ICD-10-CM

## 2021-08-16 DIAGNOSIS — I10 Essential (primary) hypertension: Secondary | ICD-10-CM | POA: Diagnosis not present

## 2021-08-16 DIAGNOSIS — G4733 Obstructive sleep apnea (adult) (pediatric): Secondary | ICD-10-CM | POA: Diagnosis not present

## 2021-08-18 ENCOUNTER — Ambulatory Visit (INDEPENDENT_AMBULATORY_CARE_PROVIDER_SITE_OTHER): Payer: HMO | Admitting: Sports Medicine

## 2021-08-18 ENCOUNTER — Other Ambulatory Visit: Payer: Self-pay

## 2021-08-18 VITALS — BP 131/65 | HR 53 | Ht 61.0 in | Wt 132.0 lb

## 2021-08-18 DIAGNOSIS — I10 Essential (primary) hypertension: Secondary | ICD-10-CM | POA: Diagnosis not present

## 2021-08-18 DIAGNOSIS — E1151 Type 2 diabetes mellitus with diabetic peripheral angiopathy without gangrene: Secondary | ICD-10-CM

## 2021-08-18 DIAGNOSIS — R252 Cramp and spasm: Secondary | ICD-10-CM

## 2021-08-18 DIAGNOSIS — N138 Other obstructive and reflux uropathy: Secondary | ICD-10-CM | POA: Diagnosis not present

## 2021-08-18 DIAGNOSIS — E1169 Type 2 diabetes mellitus with other specified complication: Secondary | ICD-10-CM

## 2021-08-18 DIAGNOSIS — N139 Obstructive and reflux uropathy, unspecified: Secondary | ICD-10-CM

## 2021-08-18 DIAGNOSIS — E118 Type 2 diabetes mellitus with unspecified complications: Secondary | ICD-10-CM | POA: Diagnosis not present

## 2021-08-18 DIAGNOSIS — E785 Hyperlipidemia, unspecified: Secondary | ICD-10-CM | POA: Diagnosis not present

## 2021-08-18 DIAGNOSIS — N401 Enlarged prostate with lower urinary tract symptoms: Secondary | ICD-10-CM | POA: Diagnosis not present

## 2021-08-18 DIAGNOSIS — N289 Disorder of kidney and ureter, unspecified: Secondary | ICD-10-CM | POA: Diagnosis not present

## 2021-08-18 LAB — POCT GLYCOSYLATED HEMOGLOBIN (HGB A1C): HbA1c, POC (controlled diabetic range): 9.9 % — AB (ref 0.0–7.0)

## 2021-08-18 MED ORDER — LISINOPRIL-HYDROCHLOROTHIAZIDE 20-25 MG PO TABS: 1.0000 | ORAL_TABLET | Freq: Every day | ORAL | 3 refills | Status: DC

## 2021-08-18 MED ORDER — GLUCOSE BLOOD VI STRP: ORAL_STRIP | 12 refills | Status: DC

## 2021-08-18 MED ORDER — GLIPIZIDE ER 10 MG PO TB24: ORAL_TABLET | ORAL | 3 refills | Status: DC

## 2021-08-18 MED ORDER — ONETOUCH DELICA LANCETS 33G MISC: 11 refills | Status: DC

## 2021-08-18 MED ORDER — TAMSULOSIN HCL 0.4 MG PO CAPS: 0.4000 mg | ORAL_CAPSULE | Freq: Two times a day (BID) | ORAL | 3 refills | Status: DC

## 2021-08-18 MED ORDER — FENOFIBRATE 160 MG PO TABS: 160.0000 mg | ORAL_TABLET | Freq: Every day | ORAL | 3 refills | Status: DC

## 2021-08-18 MED ORDER — MAGNESIUM OXIDE 400 MG PO TABS: 800.0000 mg | ORAL_TABLET | Freq: Every day | ORAL | 3 refills | Status: DC

## 2021-08-18 MED ORDER — SITAGLIPTIN PHOSPHATE 100 MG PO TABS: 100.0000 mg | ORAL_TABLET | Freq: Every day | ORAL | 3 refills | Status: DC

## 2021-08-18 MED ORDER — AZITHROMYCIN 250 MG PO TABS: ORAL_TABLET | ORAL | 0 refills | Status: DC

## 2021-08-18 MED ORDER — METFORMIN HCL 1000 MG PO TABS: 1000.0000 mg | ORAL_TABLET | Freq: Two times a day (BID) | ORAL | 3 refills | Status: DC

## 2021-08-18 MED ORDER — CLOBETASOL PROPIONATE 0.05 % EX OINT: 1.0000 "application " | TOPICAL_OINTMENT | Freq: Two times a day (BID) | CUTANEOUS | 2 refills | Status: DC | PRN

## 2021-08-18 NOTE — Assessment & Plan Note (Signed)
A1c up to 9.9%, there have been some dietary indiscretions. Increasing glipizide, Januvia, return to see me in 3 months for repeat A1c.

## 2021-08-18 NOTE — Assessment & Plan Note (Signed)
Increase magnesium oxide to 800 mg daily, checking labs.

## 2021-08-18 NOTE — Progress Notes (Signed)
° ° °  Procedures performed today:    None.  Independent interpretation of notes and tests performed by another provider:   None.  Brief History, Exam, Impression, and Recommendations:    Type 2 diabetes mellitus with peripheral vascular disease ([place]) A1c up to 9.9%, there have been some dietary indiscretions. Increasing glipizide, Januvia, return to see me in 3 months for repeat A1c.  Muscle cramps Increase magnesium oxide to 800 mg daily, checking labs.  I spent 30 minutes of total time managing this patient today, this includes chart review, face to face, and non-face to face time.  ___________________________________________ Gwen Her. Dianah Field, M.D., ABFM., CAQSM. Primary Care and [place] Instructor of [place] [facility]

## 2021-08-19 LAB — PSA, TOTAL AND FREE
PSA, % Free: 33 % (calc) (ref >25)
PSA, Free: 0.9 ng/mL
PSA, Total: 2.7 ng/mL (ref <=4.0)

## 2021-08-19 LAB — TSH: TSH: 0.8 mIU/L (ref 0.40–4.50)

## 2021-08-19 LAB — CBC
HCT: 37.4 % — ABNORMAL LOW (ref 38.5–50.0)
Hemoglobin: 12.5 g/dL — ABNORMAL LOW (ref 13.2–17.1)
MCH: 28.4 pg (ref 27.0–33.0)
MCHC: 33.4 g/dL (ref 32.0–36.0)
MCV: 85 fL (ref 80.0–100.0)
MPV: 9.4 fL (ref 7.5–12.5)
Platelets: 366 10*3/uL (ref 140–400)
RBC: 4.4 10*6/uL (ref 4.20–5.80)
RDW: 13.1 % (ref 11.0–15.0)
WBC: 6.4 10*3/uL (ref 3.8–10.8)

## 2021-08-19 LAB — COMPREHENSIVE METABOLIC PANEL
AG Ratio: 1.6 (calc) (ref 1.0–2.5)
ALT: 19 U/L (ref 9–46)
AST: 16 U/L (ref 10–35)
Albumin: 4.3 g/dL (ref 3.6–5.1)
Alkaline phosphatase (APISO): 35 U/L (ref 35–144)
BUN/Creatinine Ratio: 26 (calc) — ABNORMAL HIGH (ref 6–22)
BUN: 42 mg/dL — ABNORMAL HIGH (ref 7–25)
CO2: 28 mmol/L (ref 20–32)
Calcium: 10 mg/dL (ref 8.6–10.3)
Chloride: 101 mmol/L (ref 98–110)
Creat: 1.62 mg/dL — ABNORMAL HIGH (ref 0.70–1.28)
Globulin: 2.7 g/dL (calc) (ref 1.9–3.7)
Glucose, Bld: 245 mg/dL — ABNORMAL HIGH (ref 65–99)
Potassium: 5.4 mmol/L — ABNORMAL HIGH (ref 3.5–5.3)
Sodium: 134 mmol/L — ABNORMAL LOW (ref 135–146)
Total Bilirubin: 0.4 mg/dL (ref 0.2–1.2)
Total Protein: 7 g/dL (ref 6.1–8.1)

## 2021-08-19 LAB — LIPID PANEL
Cholesterol: 161 mg/dL (ref <200)
HDL: 28 mg/dL — ABNORMAL LOW (ref >=40)
LDL Cholesterol (Calc): 101 mg/dL (calc) — ABNORMAL HIGH
Non-HDL Cholesterol (Calc): 133 mg/dL (calc) — ABNORMAL HIGH (ref <130)
Total CHOL/HDL Ratio: 5.8 (calc) — ABNORMAL HIGH (ref <5.0)
Triglycerides: 200 mg/dL — ABNORMAL HIGH (ref <150)

## 2021-08-20 ENCOUNTER — Ambulatory Visit: Payer: HMO | Admitting: Sports Medicine

## 2021-09-03 ENCOUNTER — Ambulatory Visit: Payer: HMO | Admitting: Sports Medicine

## 2021-09-08 ENCOUNTER — Other Ambulatory Visit: Payer: Self-pay | Admitting: Sports Medicine

## 2021-09-16 DIAGNOSIS — I10 Essential (primary) hypertension: Secondary | ICD-10-CM | POA: Diagnosis not present

## 2021-09-16 DIAGNOSIS — G4733 Obstructive sleep apnea (adult) (pediatric): Secondary | ICD-10-CM | POA: Diagnosis not present

## 2021-10-14 DIAGNOSIS — I10 Essential (primary) hypertension: Secondary | ICD-10-CM | POA: Diagnosis not present

## 2021-10-14 DIAGNOSIS — G4733 Obstructive sleep apnea (adult) (pediatric): Secondary | ICD-10-CM | POA: Diagnosis not present

## 2021-10-19 ENCOUNTER — Other Ambulatory Visit: Payer: Self-pay | Admitting: Interventional Radiology

## 2021-10-19 DIAGNOSIS — Z8679 Personal history of other diseases of the circulatory system: Secondary | ICD-10-CM

## 2021-10-21 DIAGNOSIS — R3915 Urgency of urination: Secondary | ICD-10-CM | POA: Diagnosis not present

## 2021-10-21 DIAGNOSIS — N401 Enlarged prostate with lower urinary tract symptoms: Secondary | ICD-10-CM | POA: Diagnosis not present

## 2021-10-21 DIAGNOSIS — R350 Frequency of micturition: Secondary | ICD-10-CM | POA: Diagnosis not present

## 2021-10-25 ENCOUNTER — Other Ambulatory Visit: Payer: Self-pay

## 2021-10-25 DIAGNOSIS — E118 Type 2 diabetes mellitus with unspecified complications: Secondary | ICD-10-CM

## 2021-10-25 MED ORDER — GLIPIZIDE ER 10 MG PO TB24: ORAL_TABLET | ORAL | 3 refills | Status: DC

## 2021-11-02 ENCOUNTER — Ambulatory Visit
Admission: RE | Admit: 2021-11-02 | Discharge: 2021-11-02 | Disposition: A | Discharge status: Home / Self Care | Payer: HMO | Source: Ambulatory Visit | Attending: Interventional Radiology | Admitting: Interventional Radiology

## 2021-11-02 DIAGNOSIS — I70213 Atherosclerosis of native arteries of extremities with intermittent claudication, bilateral legs: Secondary | ICD-10-CM | POA: Diagnosis not present

## 2021-11-02 DIAGNOSIS — Z8679 Personal history of other diseases of the circulatory system: Secondary | ICD-10-CM

## 2021-11-02 HISTORY — PX: IR RADIOLOGIST EVAL & MGMT: IMG5224

## 2021-11-02 NOTE — Progress Notes (Signed)
? ? ?Chief Complaint: ?PAD ?  ?Referring Physician(s): ?Dr. Dianah Field ?  ?History of Present Illness: ?Michael Thomas is a 76 y.o. male presenting today for a scheduled follow up visit to [place], with history of bilateral femoral-popliteal disease, treated most recently 02/15/21 for recurrent right fem-pop stenosis.   ?  ?Michael Thomas joins [place] today in the clinic by himself.  ?  ?History: ?We first met Michael Thomas 02/01/2018, for complaints of left sided claudication.     ?  ?Treatment history has been: ?02/20/18: Left SFA directional atherectomy and DEB ?05/14/2019: Right SFA directional atherectomy and DEB ?05/07/20: Left SFA laser atherectomy and DEB for recurrent stenosis ?02/15/21: Right SFA directional atherectomy and DEB for recurrent stenosis ? ?In September of 2022, he had complains of claudication of his left>right leg while walking uphill on the treadmill.  At the time, he told me that his brother has undergone CABG bypass, and that has served him very well. Thus, he requested a consultation for possible bypass, and was referred to our colleagues in Vascular Surgery.   A formal bypass was not recommended for his symptoms.   ?  ?January of 2023 we initiated him on trial of pletal BID.  ? ? ?Interval Hx:  ?Michael Thomas returns to discuss and follow up . He complains that he is still having bilateral calf cramping, fairly symmetric. This is reproducible when he is walking on a treadmill at low speed with an incline/uphill effort, at short distance/5 minutes of effort. ? ?He continues to take the pletal, and has been taking this since January.   ? ?He denies any other changes in health.  .  ?  ?Most recent ABI 04/27/21 ?Right resting ABI: 0.94 ?Post exercise: 0.70 ?  ?Left resting ABI: 0.97 ?Post exercise: 0.64 ?  ?He continues on maximal medical therapy.   ? ?Past Medical History:  ?Diagnosis Date  ? Benign prostatic hyperplasia with urinary obstruction 03/25/2015  ? Diabetes mellitus without complication ([place])    ? Hyperlipidemia   ? Kidney stone   ? OSA (obstructive sleep apnea)   ? Vitamin D deficiency   ? ? ?Past Surgical History:  ?Procedure Laterality Date  ? DG ARTHRO THUMB*L*    ? IR ANGIOGRAM EXTREMITY BILATERAL  02/20/2018  ? IR ANGIOGRAM EXTREMITY BILATERAL  04/05/2019  ? IR ANGIOGRAM EXTREMITY LEFT  05/07/2020  ? IR ANGIOGRAM EXTREMITY RIGHT  02/15/2021  ? IR ANGIOGRAM SELECTIVE EACH ADDITIONAL VESSEL  02/20/2018  ? IR FEM POP ART ATHERECT INC PTA MOD SED  02/20/2018  ? IR FEM POP ART ATHERECT INC PTA MOD SED  05/14/2019  ? IR FEM POP ART ATHERECT INC PTA MOD SED  05/07/2020  ? IR FEM POP ART ATHERECT INC PTA MOD SED  02/15/2021  ? IR INTRAVASCULAR ULTRASOUND NON CORONARY  02/15/2021  ? IR RADIOLOGIST EVAL & MGMT  02/01/2018  ? IR RADIOLOGIST EVAL & MGMT  03/20/2018  ? IR RADIOLOGIST EVAL & MGMT  07/25/2018  ? IR RADIOLOGIST EVAL & MGMT  03/07/2019  ? IR RADIOLOGIST EVAL & MGMT  04/23/2019  ? IR RADIOLOGIST EVAL & MGMT  06/18/2019  ? IR RADIOLOGIST EVAL & MGMT  04/29/2020  ? IR RADIOLOGIST EVAL & MGMT  06/04/2020  ? IR RADIOLOGIST EVAL & MGMT  01/20/2021  ? IR RADIOLOGIST EVAL & MGMT  04/27/2021  ? IR RADIOLOGIST EVAL & MGMT  08/12/2021  ? IR US GUIDE VASC ACCESS LEFT  04/05/2019  ? IR US GUIDE VASC ACCESS  LEFT  02/15/2021  ? IR US GUIDE VASC ACCESS RIGHT  02/20/2018  ? IR US GUIDE VASC ACCESS RIGHT  05/14/2019  ? IR US GUIDE VASC ACCESS RIGHT  05/07/2020  ? NECK SURGERY    ? VASECTOMY    ? ? ?Allergies: ?Amoxicillin ? ?Medications: ?Prior to Admission medications   ?Medication Sig Start Date End Date Taking? Authorizing Provider  ?AMBULATORY NON FORMULARY MEDICATION One Touch Ultra 2 glucometer 05/15/20   Silverio Decamp, MD  ?aspirin EC 81 MG tablet Take 81 mg by mouth daily.     [provider]  ?azithromycin (ZITHROMAX Z-PAK) 250 MG tablet Take 2 tablets (500 mg) on  Day 1,  followed by 1 tablet (250 mg) once daily on Days 2 through 5. 08/18/21   Silverio Decamp, MD  ?Blood Glucose Monitoring Suppl (ONE TOUCH  ULTRA 2) w/Device KIT DM E11.8. Check fasting blood sugar every morning and 2 hours after largest meal of the day. 05/18/20   Silverio Decamp, MD  ?clobetasol ointment (TEMOVATE) 1.61 % Apply 1 application topically 2 (two) times daily as needed (blisters). 08/18/21   Silverio Decamp, MD  ?fenofibrate 160 MG tablet Take 1 tablet (160 mg total) by mouth daily. 08/18/21   Silverio Decamp, MD  ?glipiZIDE (GLUCOTROL XL) 10 MG 24 hr tablet TAKE 1 TABLET BY MOUTH EVERY DAY WITH BREAKFAST 10/25/21   Silverio Decamp, MD  ?glucose blood test strip Use as instructed 08/18/21   Silverio Decamp, MD  ?lisinopril-hydrochlorothiazide (ZESTORETIC) 20-25 MG tablet Take 1 tablet by mouth daily. 08/18/21   Silverio Decamp, MD  ?magnesium oxide (MAG-OX) 400 MG tablet Take 2 tablets (800 mg total) by mouth at bedtime. 08/18/21   Silverio Decamp, MD  ?metFORMIN (GLUCOPHAGE) 1000 MG tablet TAKE 1 TABLET (1,000 MG TOTAL) BY MOUTH 2 (TWO) TIMES DAILY WITH A MEAL. 09/08/21   Silverio Decamp, MD  ?omega-3 acid ethyl esters (LOVAZA) 1 g capsule Take 2 capsules (2 g total) by mouth 2 (two) times daily. 05/05/21   Silverio Decamp, MD  ?Jonetta Speak Lancets 09U MISC Use up to 3 times daily. 08/18/21   Silverio Decamp, MD  ?simvastatin (ZOCOR) 40 MG tablet TAKE 1 TABLET (40 MG TOTAL) BY MOUTH DAILY AT 6 PM. 02/22/21   Silverio Decamp, MD  ?sitaGLIPtin (JANUVIA) 100 MG tablet Take 1 tablet (100 mg total) by mouth daily. 08/18/21   Silverio Decamp, MD  ?tamsulosin (FLOMAX) 0.4 MG CAPS capsule Take 1 capsule (0.4 mg total) by mouth 2 (two) times daily. 08/18/21   Silverio Decamp, MD  ?  ? ?Family History  ?Problem Relation Age of Onset  ? Stroke Mother   ? Diabetes Mellitus II Mother   ? Prostate cancer Neg Hx   ? Kidney cancer Neg Hx   ? Bladder Cancer Neg Hx   ? ? ?Social History  ? ?Socioeconomic History  ? Marital status: Married  ?  Spouse name: Not on file  ?  Number of children: Not on file  ? Years of education: Not on file  ? Highest education level: Not on file  ?Occupational History  ? Not on file  ?Tobacco Use  ? Smoking status: Never  ? Smokeless tobacco: Never  ?Vaping Use  ? Vaping Use: Never used  ?Substance and Sexual Activity  ? Alcohol use: No  ? Drug use: No  ? Sexual activity: Yes  ?  Birth control/protection: None  ?  Other Topics Concern  ? Not on file  ?Social History Narrative  ? Not on file  ? ?Social Determinants of Health  ? ?Financial Resource Strain: Not on file  ?Food Insecurity: Not on file  ?Transportation Needs: Not on file  ?Physical Activity: Not on file  ?Stress: Not on file  ?Social Connections: Not on file  ? ?  ? ?Review of Systems: A 12 point ROS discussed and pertinent positives are indicated in the HPI above.  All other systems are negative. ? ?Review of Systems ? ?Vital Signs: ?BP 133/70 (BP Location: Right Arm)   Pulse 73   SpO2 98%  ? ?Physical Exam ?General: 76 yo male appearing stated age.  Well-developed, well-nourished.  No distress. ?HEENT: Atraumatic, normocephalic.  Glasses. Conjugate gaze, extra-ocular motor intact. No scleral icterus or scleral injection. No lesions on external ears, nose, lips, or gums.  Oral mucosa moist, pink.  ?Neck: Symmetric with no goiter enlargement.  ?Chest/Lungs:  Symmetric chest with inspiration/expiration.  No labored breathing.     ?Heart:    No JVD appreciated.  ?Abdomen:  Nonobese ?Genito-urinary: Deferred ?Neurologic: Alert & Oriented to person, place, and time.   Normal affect and insight.  Appropriate questions.  Moving all 4 extremities with gross sensory intact.  ?Pulse Exam:  Doppler positive right and left DP and PT.  ?Extremities: No wound.  Warm extremities ? ? ?Imaging: ?No results found. ? ?Labs: ? ?CBC: ?Recent Labs  ?  02/15/21 ?0640 08/18/21 ?0000  ?WBC 6.4 6.4  ?HGB 12.5* 12.5*  ?HCT 37.6* 37.4*  ?PLT 349 366  ? ? ?COAGS: ?Recent Labs  ?  02/15/21 ?0640  ?INR 1.0   ? ? ?BMP: ?Recent Labs  ?  02/15/21 ?0640 08/18/21 ?0000  ?NA 139 134*  ?K 4.6 5.4*  ?CL 107 101  ?CO2 22 28  ?GLUCOSE 100* 245*  ?BUN 44* 42*  ?CALCIUM 10.5* 10.0  ?CREATININE 1.73* 1.62*  ?GFRNONAA 40*  --   ? ? ?LIVER F

## 2021-11-04 ENCOUNTER — Other Ambulatory Visit: Payer: Self-pay | Admitting: Interventional Radiology

## 2021-11-04 DIAGNOSIS — Z8679 Personal history of other diseases of the circulatory system: Secondary | ICD-10-CM

## 2021-11-15 ENCOUNTER — Ambulatory Visit
Admission: RE | Admit: 2021-11-15 | Discharge: 2021-11-15 | Disposition: A | Discharge status: Home / Self Care | Payer: HMO | Source: Ambulatory Visit | Attending: Interventional Radiology | Admitting: Interventional Radiology

## 2021-11-15 ENCOUNTER — Encounter: Payer: Self-pay | Admitting: *Deleted

## 2021-11-15 ENCOUNTER — Telehealth: Payer: HMO

## 2021-11-15 DIAGNOSIS — Z8679 Personal history of other diseases of the circulatory system: Secondary | ICD-10-CM

## 2021-11-15 DIAGNOSIS — I70213 Atherosclerosis of native arteries of extremities with intermittent claudication, bilateral legs: Secondary | ICD-10-CM | POA: Diagnosis not present

## 2021-11-15 HISTORY — PX: IR RADIOLOGIST EVAL & MGMT: IMG5224

## 2021-11-15 NOTE — Progress Notes (Signed)
? ? ?Chief Complaint: ?PAD ?  ?Referring Physician(s): ?Dr. Dianah Field ?  ?History of Present Illness: ?Michael Thomas is a 76 y.o. male presenting today for a scheduled follow up visit to [place], with history of bilateral femoral-popliteal disease, treated most recently 02/15/21 for recurrent right fem-pop stenosis.   ?  ?[name] joins [place] today in the clinic by himself.  ?  ?History: ?We first met [name] 02/01/2018, for complaints of left sided claudication.     ?  ?Treatment history has been: ?02/20/18: Left SFA directional atherectomy and DEB ?05/14/2019: Right SFA directional atherectomy and DEB ?05/07/20: Left SFA laser atherectomy and DEB for recurrent stenosis ?02/15/21: Right SFA directional atherectomy and DEB for recurrent stenosis ?  ?In September of 2022, he had complains of claudication of his left>right leg while walking uphill on the treadmill.  At the time, he told me that his brother has undergone CABG bypass, and that has served him very well. Thus, he requested a consultation for possible bypass, and was referred to our colleagues in Vascular Surgery.   A formal bypass was not recommended for his symptoms.   ?  ?January of 2023 we initiated him on trial of pletal BID, which did not change his symptoms.   ?  ?Interval Hx:  ?Mr Vuncannon returns to discuss and follow up with updated non-invasive today.  ? ?He complains that his symptoms of right > left calf pain is worsening.  He describes very classic claudication type pain in the back of the calves at short distances, such as when walking on the treadmill at low speed, and now at short distances in the neighborhood at his home.   ?He was unable to complete the walking-exercise test today bc of symptoms.  ? ?He denies any other changes in health.  He denies any pain in his back or his thighs.  He has no pain from sitting to standing.  He denies any wounds or rest pain at night or during the day.  He has never had any problem with his hmg-coA  reductase inhibitor.   ?  ?He continues on maximal medical therapy.   ? ?Past Medical History:  ?Diagnosis Date  ? Benign prostatic hyperplasia with urinary obstruction 03/25/2015  ? Diabetes mellitus without complication ([place])   ? Hyperlipidemia   ? Kidney stone   ? OSA (obstructive sleep apnea)   ? Vitamin D deficiency   ? ? ?Past Surgical History:  ?Procedure Laterality Date  ? DG ARTHRO THUMB*L*    ? IR ANGIOGRAM EXTREMITY BILATERAL  02/20/2018  ? IR ANGIOGRAM EXTREMITY BILATERAL  04/05/2019  ? IR ANGIOGRAM EXTREMITY LEFT  05/07/2020  ? IR ANGIOGRAM EXTREMITY RIGHT  02/15/2021  ? IR ANGIOGRAM SELECTIVE EACH ADDITIONAL VESSEL  02/20/2018  ? IR FEM POP ART ATHERECT INC PTA MOD SED  02/20/2018  ? IR FEM POP ART ATHERECT INC PTA MOD SED  05/14/2019  ? IR FEM POP ART ATHERECT INC PTA MOD SED  05/07/2020  ? IR FEM POP ART ATHERECT INC PTA MOD SED  02/15/2021  ? IR INTRAVASCULAR ULTRASOUND NON CORONARY  02/15/2021  ? IR RADIOLOGIST EVAL & MGMT  02/01/2018  ? IR RADIOLOGIST EVAL & MGMT  03/20/2018  ? IR RADIOLOGIST EVAL & MGMT  07/25/2018  ? IR RADIOLOGIST EVAL & MGMT  03/07/2019  ? IR RADIOLOGIST EVAL & MGMT  04/23/2019  ? IR RADIOLOGIST EVAL & MGMT  06/18/2019  ? IR RADIOLOGIST EVAL & MGMT  04/29/2020  ?  IR RADIOLOGIST EVAL & MGMT  06/04/2020  ? IR RADIOLOGIST EVAL & MGMT  01/20/2021  ? IR RADIOLOGIST EVAL & MGMT  04/27/2021  ? IR RADIOLOGIST EVAL & MGMT  08/12/2021  ? IR RADIOLOGIST EVAL & MGMT  11/02/2021  ? IR US GUIDE VASC ACCESS LEFT  04/05/2019  ? IR US GUIDE VASC ACCESS LEFT  02/15/2021  ? IR US GUIDE VASC ACCESS RIGHT  02/20/2018  ? IR US GUIDE VASC ACCESS RIGHT  05/14/2019  ? IR US GUIDE VASC ACCESS RIGHT  05/07/2020  ? NECK SURGERY    ? VASECTOMY    ? ? ?Allergies: ?Amoxicillin ? ?Medications: ?Prior to Admission medications   ?Medication Sig Start Date End Date Taking? Authorizing Provider  ?AMBULATORY NON FORMULARY MEDICATION One Touch Ultra 2 glucometer 05/15/20   Silverio Decamp, MD  ?aspirin EC 81 MG tablet Take 81 mg by  mouth daily.     [provider]  ?azithromycin (ZITHROMAX Z-PAK) 250 MG tablet Take 2 tablets (500 mg) on  Day 1,  followed by 1 tablet (250 mg) once daily on Days 2 through 5. 08/18/21   Silverio Decamp, MD  ?Blood Glucose Monitoring Suppl (ONE TOUCH ULTRA 2) w/Device KIT DM E11.8. Check fasting blood sugar every morning and 2 hours after largest meal of the day. 05/18/20   Silverio Decamp, MD  ?clobetasol ointment (TEMOVATE) 8.31 % Apply 1 application topically 2 (two) times daily as needed (blisters). 08/18/21   Silverio Decamp, MD  ?fenofibrate 160 MG tablet Take 1 tablet (160 mg total) by mouth daily. 08/18/21   Silverio Decamp, MD  ?glipiZIDE (GLUCOTROL XL) 10 MG 24 hr tablet TAKE 1 TABLET BY MOUTH EVERY DAY WITH BREAKFAST 10/25/21   Silverio Decamp, MD  ?glucose blood test strip Use as instructed 08/18/21   Silverio Decamp, MD  ?lisinopril-hydrochlorothiazide (ZESTORETIC) 20-25 MG tablet Take 1 tablet by mouth daily. 08/18/21   Silverio Decamp, MD  ?magnesium oxide (MAG-OX) 400 MG tablet Take 2 tablets (800 mg total) by mouth at bedtime. 08/18/21   Silverio Decamp, MD  ?metFORMIN (GLUCOPHAGE) 1000 MG tablet TAKE 1 TABLET (1,000 MG TOTAL) BY MOUTH 2 (TWO) TIMES DAILY WITH A MEAL. 09/08/21   Silverio Decamp, MD  ?omega-3 acid ethyl esters (LOVAZA) 1 g capsule Take 2 capsules (2 g total) by mouth 2 (two) times daily. 05/05/21   Silverio Decamp, MD  ?Jonetta Speak Lancets 51V MISC Use up to 3 times daily. 08/18/21   Silverio Decamp, MD  ?simvastatin (ZOCOR) 40 MG tablet TAKE 1 TABLET (40 MG TOTAL) BY MOUTH DAILY AT 6 PM. 02/22/21   Silverio Decamp, MD  ?sitaGLIPtin (JANUVIA) 100 MG tablet Take 1 tablet (100 mg total) by mouth daily. 08/18/21   Silverio Decamp, MD  ?tamsulosin (FLOMAX) 0.4 MG CAPS capsule Take 1 capsule (0.4 mg total) by mouth 2 (two) times daily. 08/18/21   Silverio Decamp, MD  ?  ? ?Family History   ?Problem Relation Age of Onset  ? Stroke Mother   ? Diabetes Mellitus II Mother   ? Prostate cancer Neg Hx   ? Kidney cancer Neg Hx   ? Bladder Cancer Neg Hx   ? ? ?Social History  ? ?Socioeconomic History  ? Marital status: Married  ?  Spouse name: Not on file  ? Number of children: Not on file  ? Years of education: Not on file  ? Highest education level:  Not on file  ?Occupational History  ? Not on file  ?Tobacco Use  ? Smoking status: Never  ? Smokeless tobacco: Never  ?Vaping Use  ? Vaping Use: Never used  ?Substance and Sexual Activity  ? Alcohol use: No  ? Drug use: No  ? Sexual activity: Yes  ?  Birth control/protection: None  ?Other Topics Concern  ? Not on file  ?Social History Narrative  ? Not on file  ? ?Social Determinants of Health  ? ?Financial Resource Strain: Not on file  ?Food Insecurity: Not on file  ?Transportation Needs: Not on file  ?Physical Activity: Not on file  ?Stress: Not on file  ?Social Connections: Not on file  ? ? ?  ? ?Review of Systems: A 12 point ROS discussed and pertinent positives are indicated in the HPI above.  All other systems are negative. ? ?Review of Systems ? ?Vital Signs: ?There were no vitals taken for this visit. ? ?Physical Exam ?General: 76 yo male appearing stated age.  Well-developed, well-nourished.  No distress. ?HEENT: Atraumatic, normocephalic.  Conjugate gaze, extra-ocular motor intact. No scleral icterus or scleral injection. No lesions on external ears, nose, lips, or gums.  Oral mucosa moist, pink.  ?Neck: Symmetric with no goiter enlargement.  ?Chest/Lungs:  Symmetric chest with inspiration/expiration.  No labored breathing.  ?Heart:   No JVD appreciated.  ?Abdomen:  Mildly obese.  ?Genito-urinary: Deferred ?Neurologic: Alert & Oriented to person, place, and time.   Normal affect and insight.  Appropriate questions.  Moving all 4 extremities with gross sensory intact.  ?Pulse Exam:  Doppler positive right and left DP and PT ?Extremities: Warm  extremities ? ?Imaging: ?IR Radiologist Eval & Mgmt ? ?Result Date: 11/02/2021 ?Please refer to notes tab for details about interventional procedure. (Op Note)  ? ?Labs: ? ?CBC: ?Recent Labs  ?  02/15/21 ?0640 01/11/

## 2021-11-17 ENCOUNTER — Encounter: Payer: Self-pay | Admitting: Sports Medicine

## 2021-11-17 ENCOUNTER — Ambulatory Visit (INDEPENDENT_AMBULATORY_CARE_PROVIDER_SITE_OTHER): Payer: HMO | Admitting: Sports Medicine

## 2021-11-17 DIAGNOSIS — E118 Type 2 diabetes mellitus with unspecified complications: Secondary | ICD-10-CM | POA: Diagnosis not present

## 2021-11-17 DIAGNOSIS — E1151 Type 2 diabetes mellitus with diabetic peripheral angiopathy without gangrene: Secondary | ICD-10-CM | POA: Diagnosis not present

## 2021-11-17 DIAGNOSIS — E1169 Type 2 diabetes mellitus with other specified complication: Secondary | ICD-10-CM | POA: Diagnosis not present

## 2021-11-17 DIAGNOSIS — I739 Peripheral vascular disease, unspecified: Secondary | ICD-10-CM | POA: Diagnosis not present

## 2021-11-17 LAB — POCT GLYCOSYLATED HEMOGLOBIN (HGB A1C): HbA1c, POC (controlled diabetic range): 8.1 % — AB (ref 0.0–7.0)

## 2021-11-17 MED ORDER — GLIPIZIDE ER 10 MG PO TB24: ORAL_TABLET | ORAL | 3 refills | Status: DC

## 2021-11-17 NOTE — Assessment & Plan Note (Signed)
Pleasant 76 year old male, type 2 diabetes, A1c at the last visit was in the nines, today 8.1%. 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 ?In the meantime he will go back up to a full dose of glipizide and Januvia, his low sugars were in the morning so I have asked him to do some graham crackers and a bit of peanut butter in the evening. ?Return in 3 months for repeat A1c. ?

## 2021-11-17 NOTE — Progress Notes (Signed)
? ? ?  Procedures performed today:   ? ?None. ? ?Independent interpretation of notes and tests performed by another provider:  ? ?None. ? ?Brief History, Exam, Impression, and Recommendations:   ? ?Type 2 diabetes mellitus with peripheral vascular disease ([place]) ?Pleasant 76 year old male, type 2 diabetes, A1c at the last visit was in the nines, today 8.1%. 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 ?In the meantime he will go back up to a full dose of glipizide and Januvia, his low sugars were in the morning so I have asked him to do some graham crackers and a bit of peanut butter in the evening. ?Return in 3 months for repeat A1c. ? ?Left calf claudication, SFA arterial disease ?Also having bilateral calf claudication, he improved considerably post angioplasty with Dr. Earleen Newport with vascular and interventional radiology, it sounds like he is having worsening symptoms on the right, Dr. Earleen Newport sounds to be planning a dye angiography with potential stenting rather than simple angioplasty. ?If insufficient improvement with all of the above we will consider evaluation for neurogenic claudication secondary to lumbar spinal stenosis. ?Of note he did have a lumbar spine MRI in 2019. ? ?I spent 30 minutes of total time managing this patient today, this includes chart review, face to face, and non-face to face time. ? ?___________________________________________ ?Gwen Her. Dianah Field, M.D., ABFM., CAQSM. ?Primary Care and Sports Medicine ?[place] ? ?Adjunct Instructor of Family Medicine  ?[employer] ?

## 2021-11-17 NOTE — Assessment & Plan Note (Signed)
Also having bilateral calf claudication, he improved considerably post angioplasty with Dr. Earleen Newport with vascular and interventional radiology, it sounds like he is having worsening symptoms on the right, Dr. Earleen Newport sounds to be planning a dye angiography with potential stenting rather than simple angioplasty. ?If insufficient improvement with all of the above we will consider evaluation for neurogenic claudication secondary to lumbar spinal stenosis. ?Of note he did have a lumbar spine MRI in 2019. ?

## 2021-11-19 ENCOUNTER — Other Ambulatory Visit (HOSPITAL_COMMUNITY): Payer: Self-pay | Admitting: Interventional Radiology

## 2021-11-19 ENCOUNTER — Telehealth (HOSPITAL_COMMUNITY): Payer: Self-pay | Admitting: Radiology

## 2021-11-19 DIAGNOSIS — I739 Peripheral vascular disease, unspecified: Secondary | ICD-10-CM

## 2021-11-19 NOTE — Telephone Encounter (Signed)
Called pt to schedule RLE angio and possible intervention on 11/25/21 with Dr. Corrie Mckusick. Left voicemail for him to call me back to schedule. JM ?

## 2021-11-22 ENCOUNTER — Other Ambulatory Visit: Payer: Self-pay

## 2021-11-22 ENCOUNTER — Other Ambulatory Visit: Payer: Self-pay | Admitting: Student

## 2021-11-22 DIAGNOSIS — I739 Peripheral vascular disease, unspecified: Secondary | ICD-10-CM

## 2021-11-22 MED ORDER — ONETOUCH VERIO REFLECT W/DEVICE KIT: PACK | 0 refills | Status: DC

## 2021-11-22 MED ORDER — CLOPIDOGREL BISULFATE 75 MG PO TABS: 75.0000 mg | ORAL_TABLET | Freq: Every day | ORAL | 0 refills | Status: DC

## 2021-11-22 MED ORDER — ONETOUCH VERIO VI STRP: ORAL_STRIP | 12 refills | Status: DC

## 2021-11-22 NOTE — Progress Notes (Signed)
Patient ID: Michael Thomas, male   DOB: [birth year], 76 y.o.   MRN: [id number] ? ? ?Rx for Plavix 75 mg once daily #90 sent to patient's preferred pharmacy of [facility] [address], [address].  ? ?Attempted to call patient to notify at only number listed in chart, no answer no voicemail.  ? ?Brynda Greathouse, MS RD PA-C ? ? ?

## 2021-11-26 DIAGNOSIS — J301 Allergic rhinitis due to pollen: Secondary | ICD-10-CM | POA: Diagnosis not present

## 2021-11-26 DIAGNOSIS — G4733 Obstructive sleep apnea (adult) (pediatric): Secondary | ICD-10-CM | POA: Diagnosis not present

## 2021-11-26 DIAGNOSIS — J342 Deviated nasal septum: Secondary | ICD-10-CM | POA: Diagnosis not present

## 2021-12-13 ENCOUNTER — Other Ambulatory Visit: Payer: Self-pay | Admitting: Student

## 2021-12-13 NOTE — H&P (Signed)
? ?Chief Complaint: ?Patient was seen in consultation today for peripheral arterial disease with intermittent claudication  ? ?Referring Physician(s): ?Wagner,Jaime ? ?Supervising Physician: Corrie Mckusick ? ?Patient Status: [facility] - Out-pt ? ?History of Present Illness: ?Michael Thomas is a 76 y.o. male with a medical history significant for BPH, DM and bilateral femoral-popliteal disease well known to [place] since 2019. He has experienced recurrent right fem-pop stenosis and was most recently treated 02/15/21 with with SFA directional atherectomy and DEB.  ? ?He continued to experience claudication/pain while walking on the treadmill and was started on a trial of Pletal BID January 2023. He followed up with Dr. Earleen Newport 11/15/21 and the patient stated he is still experiencing life-style limiting symptoms. Dr. Earleen Newport discussed moving forward with an angiogram with possible treatment and the patient was in agreement to proceed.  ? ?Past Medical History:  ?Diagnosis Date  ? Benign prostatic hyperplasia with urinary obstruction 03/25/2015  ? Diabetes mellitus without complication ([place])   ? Hyperlipidemia   ? Kidney stone   ? OSA (obstructive sleep apnea)   ? Vitamin D deficiency   ? ? ?Past Surgical History:  ?Procedure Laterality Date  ? DG ARTHRO THUMB*L*    ? IR ANGIOGRAM EXTREMITY BILATERAL  02/20/2018  ? IR ANGIOGRAM EXTREMITY BILATERAL  04/05/2019  ? IR ANGIOGRAM EXTREMITY LEFT  05/07/2020  ? IR ANGIOGRAM EXTREMITY RIGHT  02/15/2021  ? IR ANGIOGRAM SELECTIVE EACH ADDITIONAL VESSEL  02/20/2018  ? IR FEM POP ART ATHERECT INC PTA MOD SED  02/20/2018  ? IR FEM POP ART ATHERECT INC PTA MOD SED  05/14/2019  ? IR FEM POP ART ATHERECT INC PTA MOD SED  05/07/2020  ? IR FEM POP ART ATHERECT INC PTA MOD SED  02/15/2021  ? IR INTRAVASCULAR ULTRASOUND NON CORONARY  02/15/2021  ? IR RADIOLOGIST EVAL & MGMT  02/01/2018  ? IR RADIOLOGIST EVAL & MGMT  03/20/2018  ? IR RADIOLOGIST EVAL & MGMT  07/25/2018  ? IR RADIOLOGIST EVAL & MGMT   03/07/2019  ? IR RADIOLOGIST EVAL & MGMT  04/23/2019  ? IR RADIOLOGIST EVAL & MGMT  06/18/2019  ? IR RADIOLOGIST EVAL & MGMT  04/29/2020  ? IR RADIOLOGIST EVAL & MGMT  06/04/2020  ? IR RADIOLOGIST EVAL & MGMT  01/20/2021  ? IR RADIOLOGIST EVAL & MGMT  04/27/2021  ? IR RADIOLOGIST EVAL & MGMT  08/12/2021  ? IR RADIOLOGIST EVAL & MGMT  11/02/2021  ? IR RADIOLOGIST EVAL & MGMT  11/15/2021  ? IR US GUIDE VASC ACCESS LEFT  04/05/2019  ? IR US GUIDE VASC ACCESS LEFT  02/15/2021  ? IR US GUIDE VASC ACCESS RIGHT  02/20/2018  ? IR US GUIDE VASC ACCESS RIGHT  05/14/2019  ? IR US GUIDE VASC ACCESS RIGHT  05/07/2020  ? NECK SURGERY    ? VASECTOMY    ? ? ?Allergies: ?Amoxicillin ? ?Medications: ?Prior to Admission medications   ?Medication Sig Start Date End Date Taking? Authorizing Provider  ?AMBULATORY NON FORMULARY MEDICATION One Touch Ultra 2 glucometer 05/15/20   Silverio Decamp, MD  ?aspirin EC 81 MG tablet Take 81 mg by mouth daily.     [provider]  ?Blood Glucose Monitoring Suppl (ONETOUCH VERIO REFLECT) w/Device KIT Check fasting blood sugar every morning and 2 hours after largest meal of the day., 11/22/21   Silverio Decamp, MD  ?clobetasol ointment (TEMOVATE) 7.62 % Apply 1 application topically 2 (two) times daily as needed (blisters). 08/18/21   Thekkekandam,  Gwen Her, MD  ?clopidogrel (PLAVIX) 75 MG tablet Take 1 tablet (75 mg total) by mouth daily. 11/22/21   Docia Barrier, PA  ?fenofibrate 160 MG tablet Take 1 tablet (160 mg total) by mouth daily. 08/18/21   Silverio Decamp, MD  ?glipiZIDE (GLUCOTROL XL) 10 MG 24 hr tablet TAKE 1 TABLET BY MOUTH EVERY DAY WITH BREAKFAST 11/17/21   Silverio Decamp, MD  ?glucose blood (ONETOUCH VERIO) test strip Check fasting blood sugar every morning and 2 hours after largest meal of the day., 11/22/21   Silverio Decamp, MD  ?lisinopril-hydrochlorothiazide (ZESTORETIC) 20-25 MG tablet Take 1 tablet by mouth daily. 08/18/21   Silverio Decamp, MD  ?magnesium oxide (MAG-OX) 400 MG tablet Take 2 tablets (800 mg total) by mouth at bedtime. 08/18/21   Silverio Decamp, MD  ?metFORMIN (GLUCOPHAGE) 1000 MG tablet TAKE 1 TABLET (1,000 MG TOTAL) BY MOUTH 2 (TWO) TIMES DAILY WITH A MEAL. 09/08/21   Silverio Decamp, MD  ?omega-3 acid ethyl esters (LOVAZA) 1 g capsule Take 2 capsules (2 g total) by mouth 2 (two) times daily. 05/05/21   Silverio Decamp, MD  ?Jonetta Speak Lancets 16X MISC Use up to 3 times daily. 08/18/21   Silverio Decamp, MD  ?simvastatin (ZOCOR) 40 MG tablet TAKE 1 TABLET (40 MG TOTAL) BY MOUTH DAILY AT 6 PM. 02/22/21   Silverio Decamp, MD  ?sitaGLIPtin (JANUVIA) 100 MG tablet Take 1 tablet (100 mg total) by mouth daily. 08/18/21   Silverio Decamp, MD  ?tamsulosin (FLOMAX) 0.4 MG CAPS capsule Take 1 capsule (0.4 mg total) by mouth 2 (two) times daily. 08/18/21   Silverio Decamp, MD  ?  ? ?Family History  ?Problem Relation Age of Onset  ? Stroke Mother   ? Diabetes Mellitus II Mother   ? Prostate cancer Neg Hx   ? Kidney cancer Neg Hx   ? Bladder Cancer Neg Hx   ? ? ?Social History  ? ?Socioeconomic History  ? Marital status: Married  ?  Spouse name: Not on file  ? Number of children: Not on file  ? Years of education: Not on file  ? Highest education level: Not on file  ?Occupational History  ? Not on file  ?Tobacco Use  ? Smoking status: Never  ? Smokeless tobacco: Never  ?Vaping Use  ? Vaping Use: Never used  ?Substance and Sexual Activity  ? Alcohol use: No  ? Drug use: No  ? Sexual activity: Yes  ?  Birth control/protection: None  ?Other Topics Concern  ? Not on file  ?Social History Narrative  ? Not on file  ? ?Social Determinants of Health  ? ?Financial Resource Strain: Not on file  ?Food Insecurity: Not on file  ?Transportation Needs: Not on file  ?Physical Activity: Not on file  ?Stress: Not on file  ?Social Connections: Not on file  ? ? ?Review of Systems: A 12 point ROS discussed and  pertinent positives are indicated in the HPI above.  All other systems are negative. ? ?Review of Systems  ?Constitutional:  Negative for appetite change and fatigue.  ?Respiratory:  Negative for cough and shortness of breath.   ?Cardiovascular:  Negative for chest pain and leg swelling.  ?Gastrointestinal:  Negative for abdominal pain, diarrhea, nausea and vomiting.  ?Neurological:  Negative for dizziness and headaches.  ? ?Vital Signs: ?BP (!) 145/80   Pulse 60   Temp 98.2 ?F (36.8 ?C) (Oral)  Resp 18   Ht 5' (1.524 m)   Wt 131 lb (59.4 kg)   SpO2 100%   BMI 25.58 kg/m?  ? ?Physical Exam ?Constitutional:   ?   General: He is not in acute distress. ?   Appearance: He is not ill-appearing.  ?HENT:  ?   Mouth/Throat:  ?   Mouth: Mucous membranes are moist.  ?   Pharynx: Oropharynx is clear.  ?Cardiovascular:  ?   Rate and Rhythm: Normal rate and regular rhythm.  ?   Pulses: Normal pulses.  ?   Heart sounds: Normal heart sounds.  ?Pulmonary:  ?   Effort: Pulmonary effort is normal.  ?   Breath sounds: Normal breath sounds.  ?Abdominal:  ?   General: Bowel sounds are normal.  ?   Palpations: Abdomen is soft.  ?Musculoskeletal:  ?   Right lower leg: No edema.  ?   Left lower leg: No edema.  ?Skin: ?   General: Skin is warm and dry.  ?Neurological:  ?   Mental Status: He is alert and oriented to person, place, and time.  ? ? ?Imaging: ?US ARTERIAL SEGMENTAL EXERCISE (USE FOR CLAUDICATION) ? ?Result Date: 11/15/2021 ?CLINICAL DATA:  76 year old male with bilateral lower extremity claudication EXAM: NONINVASIVE PHYSIOLOGIC VASCULAR STUDY OF BILATERAL LOWER EXTREMITIES TECHNIQUE: Evaluation of both lower extremities was performed at rest, including calculation of ankle-brachial indices, multiple segmental pressure evaluation, segmental Doppler and segmental pulse volume recording. COMPARISON:  04/27/2021 FINDINGS: Right: Resting ankle brachial index:  0.92 Patient unable to complete the post exercise time frame  Segmental blood pressure: Upper extremity pressures are symmetric. Decreased pressure in the thigh. Digital pressure 121. Doppler: Segmental Doppler demonstrates multiphasic waveforms throughout Pulse volume recording: Segmen

## 2021-12-14 ENCOUNTER — Other Ambulatory Visit (HOSPITAL_COMMUNITY): Payer: Self-pay | Admitting: Interventional Radiology

## 2021-12-14 ENCOUNTER — Ambulatory Visit (HOSPITAL_COMMUNITY)
Admission: RE | Admit: 2021-12-14 | Discharge: 2021-12-14 | Disposition: A | Discharge status: Home / Self Care | Payer: HMO | Source: Ambulatory Visit | Attending: Interventional Radiology | Admitting: Interventional Radiology

## 2021-12-14 ENCOUNTER — Other Ambulatory Visit: Payer: Self-pay

## 2021-12-14 DIAGNOSIS — I739 Peripheral vascular disease, unspecified: Secondary | ICD-10-CM

## 2021-12-14 DIAGNOSIS — G4733 Obstructive sleep apnea (adult) (pediatric): Secondary | ICD-10-CM | POA: Diagnosis not present

## 2021-12-14 DIAGNOSIS — E1151 Type 2 diabetes mellitus with diabetic peripheral angiopathy without gangrene: Secondary | ICD-10-CM | POA: Insufficient documentation

## 2021-12-14 DIAGNOSIS — I70211 Atherosclerosis of native arteries of extremities with intermittent claudication, right leg: Secondary | ICD-10-CM | POA: Diagnosis not present

## 2021-12-14 DIAGNOSIS — Z7984 Long term (current) use of oral hypoglycemic drugs: Secondary | ICD-10-CM | POA: Diagnosis not present

## 2021-12-14 DIAGNOSIS — I10 Essential (primary) hypertension: Secondary | ICD-10-CM | POA: Diagnosis not present

## 2021-12-14 HISTORY — PX: IR FEM POP ART STENT INC PTA MOD SED: IMG2311

## 2021-12-14 HISTORY — PX: IR INTRAVASCULAR ULTRASOUND NON CORONARY: IMG6085

## 2021-12-14 HISTORY — PX: IR ANGIOGRAM EXTREMITY RIGHT: IMG652

## 2021-12-14 HISTORY — PX: IR US GUIDE VASC ACCESS LEFT: IMG2389

## 2021-12-14 LAB — BASIC METABOLIC PANEL
Anion gap: 10 (ref 5–15)
BUN: 42 mg/dL — ABNORMAL HIGH (ref 8–23)
CO2: 21 mmol/L — ABNORMAL LOW (ref 22–32)
Calcium: 9.2 mg/dL (ref 8.9–10.3)
Chloride: 105 mmol/L (ref 98–111)
Creatinine, Ser: 2.14 mg/dL — ABNORMAL HIGH (ref 0.61–1.24)
GFR, Estimated: 31 mL/min — ABNORMAL LOW (ref >60)
Glucose, Bld: 244 mg/dL — ABNORMAL HIGH (ref 70–99)
Potassium: 4.7 mmol/L (ref 3.5–5.1)
Sodium: 136 mmol/L (ref 135–145)

## 2021-12-14 LAB — CBC
HCT: 34.9 % — ABNORMAL LOW (ref 39.0–52.0)
Hemoglobin: 12.2 g/dL — ABNORMAL LOW (ref 13.0–17.0)
MCH: 29.4 pg (ref 26.0–34.0)
MCHC: 35 g/dL (ref 30.0–36.0)
MCV: 84.1 fL (ref 80.0–100.0)
Platelets: 289 10*3/uL (ref 150–400)
RBC: 4.15 MIL/uL — ABNORMAL LOW (ref 4.22–5.81)
RDW: 14.4 % (ref 11.5–15.5)
WBC: 7 10*3/uL (ref 4.0–10.5)
nRBC: 0 % (ref 0.0–0.2)

## 2021-12-14 LAB — PROTIME-INR
INR: 1 (ref 0.8–1.2)
Prothrombin Time: 13.5 seconds (ref 11.4–15.2)

## 2021-12-14 MED ORDER — FENTANYL CITRATE (PF) 100 MCG/2ML IJ SOLN
INTRAMUSCULAR | Status: AC
  Filled 2021-12-14: qty 2

## 2021-12-14 MED ORDER — FENTANYL CITRATE (PF) 100 MCG/2ML IJ SOLN
INTRAMUSCULAR | Status: AC | PRN
  Administered 2021-12-14: 50 ug via INTRAVENOUS
  Administered 2021-12-14 (×2): 25 ug via INTRAVENOUS

## 2021-12-14 MED ORDER — NITROGLYCERIN 1 MG/10 ML FOR IR/CATH LAB
INTRA_ARTERIAL | Status: AC | PRN
  Administered 2021-12-14: 200 ug

## 2021-12-14 MED ORDER — NITROGLYCERIN 1 MG/10 ML FOR IR/CATH LAB
INTRA_ARTERIAL | Status: AC
  Filled 2021-12-14: qty 10

## 2021-12-14 MED ORDER — HEPARIN SODIUM (PORCINE) 1000 UNIT/ML IJ SOLN
INTRAMUSCULAR | Status: AC | PRN
  Administered 2021-12-14: 3000 [IU] via INTRAVENOUS
  Administered 2021-12-14: 4000 [IU] via INTRAVENOUS

## 2021-12-14 MED ORDER — MIDAZOLAM HCL 2 MG/2ML IJ SOLN
INTRAMUSCULAR | Status: AC | PRN
  Administered 2021-12-14: 1 mg via INTRAVENOUS
  Administered 2021-12-14 (×2): .5 mg via INTRAVENOUS

## 2021-12-14 MED ORDER — HEPARIN SODIUM (PORCINE) 1000 UNIT/ML IJ SOLN
INTRAMUSCULAR | Status: AC
  Filled 2021-12-14: qty 10

## 2021-12-14 MED ORDER — IOHEXOL 300 MG/ML  SOLN
100.0000 mL | Freq: Once | INTRAMUSCULAR | Status: AC | PRN
  Administered 2021-12-14: 56 mL via INTRA_ARTERIAL

## 2021-12-14 MED ORDER — SODIUM CHLORIDE 0.9 % IV SOLN: INTRAVENOUS | Status: DC

## 2021-12-14 MED ORDER — LIDOCAINE HCL 1 % IJ SOLN
INTRAMUSCULAR | Status: AC
  Administered 2021-12-14: 10 mL
  Filled 2021-12-14: qty 20

## 2021-12-14 MED ORDER — MIDAZOLAM HCL 2 MG/2ML IJ SOLN
INTRAMUSCULAR | Status: AC
Start: 2021-12-14 — End: 2021-12-14
  Filled 2021-12-14: qty 2

## 2021-12-14 NOTE — Sedation Documentation (Signed)
Patient transported to short stay. Denice Paradise RN at the bedside to receive patient. Left groin site intact. No drainage noted. Soft to palpation, no hematoma noted. +1 distal pulses intact.  ?

## 2021-12-14 NOTE — Sedation Documentation (Signed)
Right popliteal artery post 200 mcg nitro challenge 28/24 (26) ?

## 2021-12-14 NOTE — Sedation Documentation (Signed)
Patient took 225 mg of plavix at 0830. Instructed to do so per Dr. Earleen Newport ? ?

## 2021-12-14 NOTE — Procedures (Signed)
Interventional Radiology Procedure Note ? ?Procedure:  ? ?US guided left CFA access ?Right lower extremity angiogram.  ?Right fem-pop IVUS ?Segmental IA Pressure measurements, with NTG challenge of popliteal artery pre-treatment, and IA pressure post treatment ?PTA, DEB, and supera stenting of the diseased SFA/popliteal artery ?CELT for hemostasis. ? ?Findings: ?Advanced right SFA disease, with tandem sites of critical stenosis at the adductor canal.  ? ?Aorta: 141/65 (94) ?Right EIA: 141/65 (94) ?Pre-treat popliteal artery: 52/44 (48) ?Pre-treat popliteal artery with NTG challenge: 28/24 (26) ?Post-treat popliteal artery: 123/67 (92)  ?Marland Kitchen  ?Complications: None ? ?Recommendations:  ?- Left hip straight x 90 minutes.  CELT for closure.   ?- Bedrest x 90 for sedation ?- advance diet ?- routine wound care ?- OK to DC home in 90 minutes when goals met ?- continue DAPT ( ASA & plavix daily).  Plavix 32m daily x 90 days ?- Do not submerge for 7 days ?- follow up with Dr. WEarleen Newportin 4-6 weeks with new ABI & segmental exam  ? ?Signed, ? ?JDulcy Fanny WEarleen Newport DO ? ? ?

## 2021-12-14 NOTE — Sedation Documentation (Signed)
6 fr celt deployed left groin ? ?

## 2021-12-14 NOTE — Sedation Documentation (Signed)
Right external iliac artery 141/65 (94) ?

## 2021-12-14 NOTE — Sedation Documentation (Signed)
Right popliteal artery 52/44 (48) ?

## 2021-12-14 NOTE — Sedation Documentation (Signed)
Aortic Pressure 141/65 (94) ?

## 2021-12-14 NOTE — Sedation Documentation (Signed)
Right popliteal artery post intervention (stenting) 123/69 (92) ?

## 2021-12-15 ENCOUNTER — Other Ambulatory Visit: Payer: Self-pay | Admitting: Interventional Radiology

## 2021-12-15 DIAGNOSIS — I739 Peripheral vascular disease, unspecified: Secondary | ICD-10-CM

## 2021-12-26 ENCOUNTER — Other Ambulatory Visit: Payer: Self-pay | Admitting: Sports Medicine

## 2022-01-14 DIAGNOSIS — I10 Essential (primary) hypertension: Secondary | ICD-10-CM | POA: Diagnosis not present

## 2022-01-14 DIAGNOSIS — G4733 Obstructive sleep apnea (adult) (pediatric): Secondary | ICD-10-CM | POA: Diagnosis not present

## 2022-01-26 ENCOUNTER — Ambulatory Visit
Admission: RE | Admit: 2022-01-26 | Discharge: 2022-01-26 | Disposition: A | Discharge status: Home / Self Care | Payer: HMO | Source: Ambulatory Visit | Attending: Interventional Radiology | Admitting: Interventional Radiology

## 2022-01-26 DIAGNOSIS — I739 Peripheral vascular disease, unspecified: Secondary | ICD-10-CM

## 2022-01-26 DIAGNOSIS — I70211 Atherosclerosis of native arteries of extremities with intermittent claudication, right leg: Secondary | ICD-10-CM | POA: Diagnosis not present

## 2022-01-26 HISTORY — PX: IR RADIOLOGIST EVAL & MGMT: IMG5224

## 2022-01-26 NOTE — Progress Notes (Signed)
Chief Complaint: Claudication  Referring Physician(s): Dr. Dianah Field  History of Present Illness: Michael Thomas is a 76 y.o. male presenting as a scheduled follow up to [place] today, SP treatment of right femoral-popliteal occlusive disease 12/14/21.   Michael Thomas is here by himself today for the follow up .    On 5/9 we treated his right SFA for multi-focal disease, with DEB and supera. He called a few days afterwards complaining of thigh pain, attributable to stent pain.  He tells me that has now resolved, with no current symptoms.   He tells me that he recently just got back from a trip with his wife to [place], [place], [place], and [place].  He says his prior claudication symptoms have completely resolved.  He tells me that he feels that when he is walking long distances or hard, he starts to feel symptoms in his left calf.    He continues on maximal medical therapy.   Repeat ABI/Non-invasive: Rigtht ABI: 1.02, normalized.  Normal segmental Left ABI: 0.92.  Normal segmental.   Prior post exercise exam in September showed left ABI fell to 0.64.  Past Medical History:  Diagnosis Date   Benign prostatic hyperplasia with urinary obstruction 03/25/2015   Diabetes mellitus without complication ([place])    Hyperlipidemia    Kidney stone    OSA (obstructive sleep apnea)    Vitamin D deficiency     Past Surgical History:  Procedure Laterality Date   DG ARTHRO THUMB*L*     IR ANGIOGRAM EXTREMITY BILATERAL  02/20/2018   IR ANGIOGRAM EXTREMITY BILATERAL  04/05/2019   IR ANGIOGRAM EXTREMITY LEFT  05/07/2020   IR ANGIOGRAM EXTREMITY RIGHT  02/15/2021   IR ANGIOGRAM EXTREMITY RIGHT  12/14/2021   IR ANGIOGRAM SELECTIVE EACH ADDITIONAL VESSEL  02/20/2018   IR FEM POP ART ATHERECT INC PTA MOD SED  02/20/2018   IR FEM POP ART ATHERECT INC PTA MOD SED  05/14/2019   IR FEM POP ART ATHERECT INC PTA MOD SED  05/07/2020   IR FEM POP ART ATHERECT INC PTA MOD SED  02/15/2021   IR FEM POP ART STENT INC PTA  MOD SED  12/14/2021   IR INTRAVASCULAR ULTRASOUND NON CORONARY  02/15/2021   IR INTRAVASCULAR ULTRASOUND NON CORONARY  12/14/2021   IR RADIOLOGIST EVAL & MGMT  02/01/2018   IR RADIOLOGIST EVAL & MGMT  03/20/2018   IR RADIOLOGIST EVAL & MGMT  07/25/2018   IR RADIOLOGIST EVAL & MGMT  03/07/2019   IR RADIOLOGIST EVAL & MGMT  04/23/2019   IR RADIOLOGIST EVAL & MGMT  06/18/2019   IR RADIOLOGIST EVAL & MGMT  04/29/2020   IR RADIOLOGIST EVAL & MGMT  06/04/2020   IR RADIOLOGIST EVAL & MGMT  01/20/2021   IR RADIOLOGIST EVAL & MGMT  04/27/2021   IR RADIOLOGIST EVAL & MGMT  08/12/2021   IR RADIOLOGIST EVAL & MGMT  11/02/2021   IR RADIOLOGIST EVAL & MGMT  11/15/2021   IR US GUIDE VASC ACCESS LEFT  04/05/2019   IR US GUIDE VASC ACCESS LEFT  02/15/2021   IR US GUIDE VASC ACCESS LEFT  12/14/2021   IR US GUIDE VASC ACCESS RIGHT  02/20/2018   IR US GUIDE VASC ACCESS RIGHT  05/14/2019   IR US GUIDE VASC ACCESS RIGHT  05/07/2020   NECK SURGERY     VASECTOMY      Allergies: Amoxicillin  Medications: Prior to Admission medications   Medication Sig Start Date End Date  Taking? Authorizing Provider  AMBULATORY NON FORMULARY MEDICATION One Touch Ultra 2 glucometer 05/15/20   Silverio Decamp, MD  aspirin EC 81 MG tablet Take 81 mg by mouth at bedtime.    [provider]  Blood Glucose Monitoring Suppl (ONETOUCH VERIO REFLECT) w/Device KIT Check fasting blood sugar every morning and 2 hours after largest meal of the day., 11/22/21   Silverio Decamp, MD  clobetasol ointment (TEMOVATE) 2.63 % Apply 1 application topically 2 (two) times daily as needed (blisters). 08/18/21   Silverio Decamp, MD  clopidogrel (PLAVIX) 75 MG tablet Take 1 tablet (75 mg total) by mouth daily. 11/22/21   Docia Barrier, PA  fenofibrate 160 MG tablet Take 1 tablet (160 mg total) by mouth daily. 08/18/21   Silverio Decamp, MD  fluticasone (FLONASE) 50 MCG/ACT nasal spray Place 1 spray into both nostrils daily as  needed for allergies or rhinitis.    [provider]  glipiZIDE (GLUCOTROL XL) 10 MG 24 hr tablet TAKE 1 TABLET BY MOUTH EVERY DAY WITH BREAKFAST 11/17/21   Silverio Decamp, MD  glucose blood (ONETOUCH VERIO) test strip Check fasting blood sugar every morning and 2 hours after largest meal of the day., 11/22/21   Silverio Decamp, MD  lisinopril-hydrochlorothiazide (ZESTORETIC) 20-25 MG tablet Take 1 tablet by mouth daily. 08/18/21   Silverio Decamp, MD  magnesium oxide (MAG-OX) 400 MG tablet Take 2 tablets (800 mg total) by mouth at bedtime. Patient taking differently: Take 400 mg by mouth at bedtime. 08/18/21   Silverio Decamp, MD  metFORMIN (GLUCOPHAGE) 1000 MG tablet TAKE 1 TABLET (1,000 MG TOTAL) BY MOUTH 2 (TWO) TIMES DAILY WITH A MEAL. 09/08/21   Silverio Decamp, MD  omega-3 acid ethyl esters (LOVAZA) 1 g capsule Take 2 capsules (2 g total) by mouth 2 (two) times daily. Patient taking differently: Take 1 g by mouth 2 (two) times daily. 05/05/21   Silverio Decamp, MD  OneTouch Delica Lancets 33L MISC Use up to 3 times daily. 08/18/21   Silverio Decamp, MD  simvastatin (ZOCOR) 40 MG tablet TAKE 1 TABLET (40 MG TOTAL) BY MOUTH DAILY AT 6 PM. 02/22/21   Silverio Decamp, MD  sitaGLIPtin (JANUVIA) 100 MG tablet Take 1 tablet (100 mg total) by mouth daily. 08/18/21   Silverio Decamp, MD  solifenacin (VESICARE) 5 MG tablet Take 5 mg by mouth daily.    [provider]  tamsulosin (FLOMAX) 0.4 MG CAPS capsule Take 1 capsule (0.4 mg total) by mouth 2 (two) times daily. 08/18/21   Silverio Decamp, MD     Family History  Problem Relation Age of Onset   Stroke Mother    Diabetes Mellitus II Mother    Prostate cancer Neg Hx    Kidney cancer Neg Hx    Bladder Cancer Neg Hx     Social History   Socioeconomic History   Marital status: Married    Spouse name: Not on file   Number of children: Not on file   Years of  education: Not on file   Highest education level: Not on file  Occupational History   Not on file  Tobacco Use   Smoking status: Never   Smokeless tobacco: Never  Vaping Use   Vaping Use: Never used  Substance and Sexual Activity   Alcohol use: No   Drug use: No   Sexual activity: Yes    Birth control/protection: None  Other  Topics Concern   Not on file  Social History Narrative   Not on file   Social Determinants of Health   Financial Resource Strain: Not on file  Food Insecurity: Not on file  Transportation Needs: Not on file  Physical Activity: Not on file  Stress: Not on file  Social Connections: Not on file       Review of Systems: A 12 point ROS discussed and pertinent positives are indicated in the HPI above.  All other systems are negative.  Review of Systems  Vital Signs: There were no vitals taken for this visit.      Physical Exam General: 76 yo male appearing stated age.  Well-developed, well-nourished.  No distress. HEENT: Atraumatic, normocephalic.  Conjugate gaze, extra-ocular motor intact. No scleral icterus or scleral injection. No lesions on external ears, nose, lips, or gums.  Oral mucosa moist, pink.  Neck: Symmetric with no goiter enlargement.  Chest/Lungs:  Symmetric chest with inspiration/expiration.  No labored breathing.    Heart:   No JVD appreciated.  Abdomen:  Soft, NT/ND, with + bowel sounds.   Genito-urinary: Deferred Neurologic: Alert & Oriented to person, place, and time.   Normal affect and insight.  Appropriate questions.  Moving all 4 extremities with gross sensory intact.  Pulse Exam:  Doppler + PT and DP pulses bilateral.  Extremities: No wound.    Mallampati Score:     Imaging: No results found.  Labs:  CBC: Recent Labs    02/15/21 0640 08/18/21 0000 12/14/21 0714  WBC 6.4 6.4 7.0  HGB 12.5* 12.5* 12.2*  HCT 37.6* 37.4* 34.9*  PLT 349 366 289    COAGS: Recent Labs    02/15/21 0640 12/14/21 0714  INR  1.0 1.0    BMP: Recent Labs    02/15/21 0640 08/18/21 0000 12/14/21 0714  NA 139 134* 136  K 4.6 5.4* 4.7  CL 107 101 105  CO2 22 28 21*  GLUCOSE 100* 245* 244*  BUN 44* 42* 42*  CALCIUM 10.5* 10.0 9.2  CREATININE 1.73* 1.62* 2.14*  GFRNONAA 40*  --  31*    LIVER FUNCTION TESTS: Recent Labs    08/18/21 0000  BILITOT 0.4  AST 16  ALT 19  PROT 7.0    TUMOR MARKERS: No results for input(s): "AFPTM", "CEA", "CA199", "CHROMGRNA" in the last 8760 hours.  Assessment and Plan:  Mr Donaldson is 76 yo male with PAD, recently treated right [place] class 3 symptoms with DEB and supera stenting of mult-focal dz of the right SFA.    He has had complete resolution of symptoms, and is feeling very good.    He says he will start back on the treadmill again next week, which I have encouraged him to do so.  He has concerns for the left leg, as he feels he is having some recurrent calf pain with exercise.    I did let him know to keep up with the treadmill, and to call us if he needs [place] over the next few months.  For now, we will schedule him for 6 month follow up for office visit only.  If he has worsening left sided symptoms, he says he will call earlier.    Continue maximal medical therapy.    Plan: - We will schedule 6 month follow up appointment, no imaging needed.  He says he will call soon if he has any concerns of left leg - continue maximal medical therapy.   Electronically Signed: York Cerise  Earleen Newport 01/26/2022, 11:32 AM   I spent a total of    25 Minutes in face to face in clinical consultation, greater than 50% of which was counseling/coordinating care for PAD, claudication, SP treatment of right fem-pop disease with DEB and stent

## 2022-01-28 DIAGNOSIS — G4733 Obstructive sleep apnea (adult) (pediatric): Secondary | ICD-10-CM | POA: Diagnosis not present

## 2022-01-28 DIAGNOSIS — I10 Essential (primary) hypertension: Secondary | ICD-10-CM | POA: Diagnosis not present

## 2022-02-13 DIAGNOSIS — G4733 Obstructive sleep apnea (adult) (pediatric): Secondary | ICD-10-CM | POA: Diagnosis not present

## 2022-02-13 DIAGNOSIS — I10 Essential (primary) hypertension: Secondary | ICD-10-CM | POA: Diagnosis not present

## 2022-02-16 ENCOUNTER — Ambulatory Visit (INDEPENDENT_AMBULATORY_CARE_PROVIDER_SITE_OTHER): Payer: HMO | Admitting: Sports Medicine

## 2022-02-16 ENCOUNTER — Encounter: Payer: Self-pay | Admitting: Sports Medicine

## 2022-02-16 DIAGNOSIS — E118 Type 2 diabetes mellitus with unspecified complications: Secondary | ICD-10-CM

## 2022-02-16 DIAGNOSIS — E785 Hyperlipidemia, unspecified: Secondary | ICD-10-CM | POA: Diagnosis not present

## 2022-02-16 DIAGNOSIS — I10 Essential (primary) hypertension: Secondary | ICD-10-CM

## 2022-02-16 DIAGNOSIS — E1151 Type 2 diabetes mellitus with diabetic peripheral angiopathy without gangrene: Secondary | ICD-10-CM | POA: Diagnosis not present

## 2022-02-16 LAB — POCT GLYCOSYLATED HEMOGLOBIN (HGB A1C): HbA1c, POC (controlled diabetic range): 7.7 % — AB (ref 0.0–7.0)

## 2022-02-16 MED ORDER — GLIPIZIDE ER 10 MG PO TB24
ORAL_TABLET | ORAL | 3 refills | Status: DC
Start: 2022-02-16 — End: 2023-02-06

## 2022-02-16 MED ORDER — LISINOPRIL-HYDROCHLOROTHIAZIDE 20-25 MG PO TABS: 1.0000 | ORAL_TABLET | Freq: Every day | ORAL | 3 refills | Status: DC

## 2022-02-16 MED ORDER — OMEGA-3-ACID ETHYL ESTERS 1 G PO CAPS: 2.0000 g | ORAL_CAPSULE | Freq: Two times a day (BID) | ORAL | 3 refills | Status: DC

## 2022-02-16 MED ORDER — PIOGLITAZONE HCL 30 MG PO TABS: 30.0000 mg | ORAL_TABLET | Freq: Every day | ORAL | 3 refills | Status: DC

## 2022-02-16 NOTE — Assessment & Plan Note (Signed)
Pleasant 76 year old male, type 2 diabetes, A1c at the last visit was 8.1%, he was doing half dose glipizide and half dose Januvia for cost reasons. I asked him to go up to the full dose and his A1c has improved to 7.7%. He is worried about entering the donut hole and Januvia becomes too expensive, so I will go ahead and add pioglitazone for him to take when he goes into the donut hole. He can get a good Rx coupon as this is a generic. He is up-to-date on his other diabetic screenings.

## 2022-02-16 NOTE — Progress Notes (Signed)
    Procedures performed today:    None.  Independent interpretation of notes and tests performed by another provider:   None.  Brief History, Exam, Impression, and Recommendations:    Type 2 diabetes mellitus with peripheral vascular disease ([place]) Pleasant 76 year old male, type 2 diabetes, A1c at the last visit was 8.1%, he was doing half dose glipizide and half dose Januvia for cost reasons. I asked him to go up to the full dose and his A1c has improved to 7.7%. He is worried about entering the donut hole and Januvia becomes too expensive, so I will go ahead and add pioglitazone for him to take when he goes into the donut hole. He can get a good Rx coupon as this is a generic. He is up-to-date on his other diabetic screenings.  Chronic process not at goal with pharmacologic intervention  ____________________________________________ Gwen Her. Dianah Field, M.D., ABFM., CAQSM., AME. Primary Care and Sports Medicine  [facility]  Adjunct Professor of [employer] [facility] [employer]  Risk manager

## 2022-03-03 DIAGNOSIS — H35033 Hypertensive retinopathy, bilateral: Secondary | ICD-10-CM | POA: Diagnosis not present

## 2022-03-15 ENCOUNTER — Other Ambulatory Visit: Payer: Self-pay

## 2022-03-15 MED ORDER — CLOBETASOL PROPIONATE 0.05 % EX OINT: 1.0000 | TOPICAL_OINTMENT | Freq: Two times a day (BID) | CUTANEOUS | 2 refills | Status: DC | PRN

## 2022-03-16 DIAGNOSIS — I10 Essential (primary) hypertension: Secondary | ICD-10-CM | POA: Diagnosis not present

## 2022-03-16 DIAGNOSIS — G4733 Obstructive sleep apnea (adult) (pediatric): Secondary | ICD-10-CM | POA: Diagnosis not present

## 2022-03-21 ENCOUNTER — Other Ambulatory Visit: Payer: Self-pay | Admitting: Interventional Radiology

## 2022-03-21 DIAGNOSIS — I739 Peripheral vascular disease, unspecified: Secondary | ICD-10-CM

## 2022-03-22 ENCOUNTER — Other Ambulatory Visit: Payer: Self-pay | Admitting: Sports Medicine

## 2022-03-23 ENCOUNTER — Ambulatory Visit
Admission: RE | Admit: 2022-03-23 | Discharge: 2022-03-23 | Disposition: A | Discharge status: Home / Self Care | Payer: HMO | Source: Ambulatory Visit | Attending: Interventional Radiology | Admitting: Interventional Radiology

## 2022-03-23 DIAGNOSIS — I70213 Atherosclerosis of native arteries of extremities with intermittent claudication, bilateral legs: Secondary | ICD-10-CM | POA: Diagnosis not present

## 2022-03-23 DIAGNOSIS — I70211 Atherosclerosis of native arteries of extremities with intermittent claudication, right leg: Secondary | ICD-10-CM | POA: Diagnosis not present

## 2022-03-23 DIAGNOSIS — I739 Peripheral vascular disease, unspecified: Secondary | ICD-10-CM

## 2022-03-23 HISTORY — PX: IR RADIOLOGIST EVAL & MGMT: IMG5224

## 2022-03-23 NOTE — Progress Notes (Signed)
Chief Complaint: Claudication   Referring Physician(s): Dr. Dianah Field   History of Present Illness: Michael Thomas is a 76 y.o. male presenting as a scheduled follow up to [facility] today, with history of PAD and claudication.    Michael Thomas is here by himself today for the follow up .    We have known Michael Thomas for some time, having first met 02/01/2018.  He has known PAD, and he has had repeat treatments of both of his fem-pop segments for life-style limiting short distance claudication.  Most recently on 12/14/2021 we treated his right SFA for multi-focal disease, with DEB and supera. Previously he has had DAART strategy bilaterally, but has had recurrence.  It seems he has particularly virulent phenotype of his fem-pop atherosclerosis.    He confirms that his right leg is feeling very well, with no symptoms at rest or with walking/treadmill walking.  He denies any nocturnal pain.   His complaint today is that his left calf is worsening pain with ambulation.  He is unable to participate on the treadmill exercising as he would prefer.  He very much enjoys going to the [employer]/gym and walking on the treadmill, as well as walking in his neighborhood with his wife.    He continues on maximal medical therapy.   He tells me that he is getting a recommendation for surgery on cataracts, both eyes.    Today's repeat ABI/Non-invasive 03/23/2022: Rigtht ABI: 1.05, normalized.  Normal segmental Left ABI: 0.99.  Normal doppler.  Segmental PVR shows distal fem pop problem Left ABI post exercise: 0.35, severe range. .     Past Medical History:  Diagnosis Date   Benign prostatic hyperplasia with urinary obstruction 03/25/2015   Diabetes mellitus without complication (HCC)    Hyperlipidemia    Kidney stone    OSA (obstructive sleep apnea)    Vitamin D deficiency     Past Surgical History:  Procedure Laterality Date   DG ARTHRO THUMB*L*     IR ANGIOGRAM EXTREMITY BILATERAL  02/20/2018    IR ANGIOGRAM EXTREMITY BILATERAL  04/05/2019   IR ANGIOGRAM EXTREMITY LEFT  05/07/2020   IR ANGIOGRAM EXTREMITY RIGHT  02/15/2021   IR ANGIOGRAM EXTREMITY RIGHT  12/14/2021   IR ANGIOGRAM SELECTIVE EACH ADDITIONAL VESSEL  02/20/2018   IR FEM POP ART ATHERECT INC PTA MOD SED  02/20/2018   IR FEM POP ART ATHERECT INC PTA MOD SED  05/14/2019   IR FEM POP ART ATHERECT INC PTA MOD SED  05/07/2020   IR FEM POP ART ATHERECT INC PTA MOD SED  02/15/2021   IR FEM POP ART STENT INC PTA MOD SED  12/14/2021   IR INTRAVASCULAR ULTRASOUND NON CORONARY  02/15/2021   IR INTRAVASCULAR ULTRASOUND NON CORONARY  12/14/2021   IR RADIOLOGIST EVAL & MGMT  02/01/2018   IR RADIOLOGIST EVAL & MGMT  03/20/2018   IR RADIOLOGIST EVAL & MGMT  07/25/2018   IR RADIOLOGIST EVAL & MGMT  03/07/2019   IR RADIOLOGIST EVAL & MGMT  04/23/2019   IR RADIOLOGIST EVAL & MGMT  06/18/2019   IR RADIOLOGIST EVAL & MGMT  04/29/2020   IR RADIOLOGIST EVAL & MGMT  06/04/2020   IR RADIOLOGIST EVAL & MGMT  01/20/2021   IR RADIOLOGIST EVAL & MGMT  04/27/2021   IR RADIOLOGIST EVAL & MGMT  08/12/2021   IR RADIOLOGIST EVAL & MGMT  11/02/2021   IR RADIOLOGIST EVAL & MGMT  11/15/2021   IR RADIOLOGIST EVAL & MGMT  01/26/2022   IR US GUIDE VASC ACCESS LEFT  04/05/2019   IR US GUIDE VASC ACCESS LEFT  02/15/2021   IR US GUIDE VASC ACCESS LEFT  12/14/2021   IR US GUIDE VASC ACCESS RIGHT  02/20/2018   IR US GUIDE VASC ACCESS RIGHT  05/14/2019   IR US GUIDE VASC ACCESS RIGHT  05/07/2020   NECK SURGERY     VASECTOMY      Allergies: Amoxicillin  Medications: Prior to Admission medications   Medication Sig Start Date End Date Taking? Authorizing Provider  AMBULATORY NON FORMULARY MEDICATION One Touch Ultra 2 glucometer 05/15/20   Silverio Decamp, MD  aspirin EC 81 MG tablet Take 81 mg by mouth at bedtime.    [provider]  Blood Glucose Monitoring Suppl (ONETOUCH VERIO REFLECT) w/Device KIT Check fasting blood sugar every morning and 2 hours after largest  meal of the day., 11/22/21   Silverio Decamp, MD  clobetasol ointment (TEMOVATE) 9.32 % Apply 1 Application topically 2 (two) times daily as needed (blisters). 03/15/22   Silverio Decamp, MD  clopidogrel (PLAVIX) 75 MG tablet Take 1 tablet (75 mg total) by mouth daily. 11/22/21   Docia Barrier, PA  fenofibrate 160 MG tablet Take 1 tablet (160 mg total) by mouth daily. 08/18/21   Silverio Decamp, MD  fluticasone (FLONASE) 50 MCG/ACT nasal spray Place 1 spray into both nostrils daily as needed for allergies or rhinitis.    [provider]  glipiZIDE (GLUCOTROL XL) 10 MG 24 hr tablet TAKE 1 TABLET BY MOUTH EVERY DAY WITH BREAKFAST 02/16/22   Silverio Decamp, MD  glucose blood (ONETOUCH VERIO) test strip Check fasting blood sugar every morning and 2 hours after largest meal of the day., 11/22/21   Silverio Decamp, MD  lisinopril-hydrochlorothiazide (ZESTORETIC) 20-25 MG tablet Take 1 tablet by mouth daily. 02/16/22   Silverio Decamp, MD  magnesium oxide (MAG-OX) 400 MG tablet Take 2 tablets (800 mg total) by mouth at bedtime. Patient taking differently: Take 400 mg by mouth at bedtime. 08/18/21   Silverio Decamp, MD  metFORMIN (GLUCOPHAGE) 1000 MG tablet TAKE 1 TABLET (1,000 MG TOTAL) BY MOUTH 2 (TWO) TIMES DAILY WITH A MEAL. 09/08/21   Silverio Decamp, MD  omega-3 acid ethyl esters (LOVAZA) 1 g capsule Take 2 capsules (2 g total) by mouth 2 (two) times daily. 02/16/22   Silverio Decamp, MD  OneTouch Delica Lancets 35T MISC Use up to 3 times daily. 08/18/21   Silverio Decamp, MD  pioglitazone (ACTOS) 30 MG tablet Take 1 tablet (30 mg total) by mouth daily. 02/16/22   Silverio Decamp, MD  simvastatin (ZOCOR) 40 MG tablet TAKE 1 TABLET BY MOUTH DAILY AT 6 PM. 03/22/22   Silverio Decamp, MD  sitaGLIPtin (JANUVIA) 100 MG tablet Take 1 tablet (100 mg total) by mouth daily. 08/18/21   Silverio Decamp, MD  tamsulosin  (FLOMAX) 0.4 MG CAPS capsule Take 1 capsule (0.4 mg total) by mouth 2 (two) times daily. 08/18/21   Silverio Decamp, MD     Family History  Problem Relation Age of Onset   Stroke Mother    Diabetes Mellitus II Mother    Prostate cancer Neg Hx    Kidney cancer Neg Hx    Bladder Cancer Neg Hx     Social History   Socioeconomic History   Marital status: Married    Spouse name: Not on file  Number of children: Not on file   Years of education: Not on file   Highest education level: Not on file  Occupational History   Not on file  Tobacco Use   Smoking status: Never   Smokeless tobacco: Never  Vaping Use   Vaping Use: Never used  Substance and Sexual Activity   Alcohol use: No   Drug use: No   Sexual activity: Yes    Birth control/protection: None  Other Topics Concern   Not on file  Social History Narrative   Not on file   Social Determinants of Health   Financial Resource Strain: Not on file  Food Insecurity: Not on file  Transportation Needs: Not on file  Physical Activity: Not on file  Stress: Not on file  Social Connections: Not on file      Review of Systems: A 12 point ROS discussed and pertinent positives are indicated in the HPI above.  All other systems are negative.  Review of Systems  Vital Signs: There were no vitals taken for this visit.  Advance Care Plan: The advanced care plan/surrogate decision maker was discussed at the time of visit and documented in the medical record.    Physical Exam General: 76 yo male appearing stated age.  Well-developed, well-nourished.  No distress. HEENT: Atraumatic, normocephalic.  Conjugate gaze, extra-ocular motor intact. No scleral icterus or scleral injection. No lesions on external ears, nose, lips, or gums.  Oral mucosa moist, pink.  Neck: Symmetric with no goiter enlargement.  Chest/Lungs:  Symmetric chest with inspiration/expiration.  No labored breathing.   Marland Kitchen  Heart:  No JVD appreciated.   Abdomen:  Soft, NT/ND, with + bowel sounds.   Genito-urinary: Deferred Neurologic: Alert & Oriented to person, place, and time.   Normal affect and insight.  Appropriate questions.  Moving all 4 extremities with gross sensory intact.  Pulse Exam:  Palpable right PT and DP, doppler positive left PT and DP Extremities: No wound.  Hairless.  Warm and well perfused  Mallampati Score:     Imaging: US ARTERIAL SEGMENTAL EXERCISE (USE FOR CLAUDICATION)  Result Date: 03/23/2022 CLINICAL DATA:  76 year old male with claudication and recent right-sided femoropopliteal treatment EXAM: NONINVASIVE PHYSIOLOGIC VASCULAR STUDY OF BILATERAL LOWER EXTREMITIES TECHNIQUE: Evaluation of both lower extremities was performed at rest, including calculation of ankle-brachial indices, multiple segmental pressure evaluation, segmental Doppler and segmental pulse volume recording. COMPARISON:  Multiple prior most recent 01/26/2022 FINDINGS: Right: Resting ankle brachial index:  1.05 Post exercise: 0.82 Segmental blood pressure: Upper extremity pressures are symmetric. Noncompressible thigh pressures. No significant drop between segments. Doppler: Segmental Doppler demonstrates triphasic waveforms throughout Pulse volume recording: Segmental PVR maintained throughout Left: Resting ankle brachial index: 0.99 Post exercise: 0.35 Segmental blood pressure: Upper extremity pressures are symmetric. No significant drop between segments. Doppler: Segmental Doppler demonstrates triphasic waveforms throughout Pulse volume recording: Segmental PVR maintained, with loss of augmentation in the calf. Deterioration at the ankle. Additional: IMPRESSION: Right: Resting ABI remains within normal limits. Segmental exam demonstrates no significant arterial occlusive disease. Left: Resting ABI within normal limits, with post exercise value in the severe range arterial occlusive disease. Segmental exam demonstrates distal femoropopliteal disease.  Signed, Dulcy Fanny. Nadene Rubins, RPVI [facility] Electronically Signed   By: Corrie Mckusick D.O.   On: 03/23/2022 15:32    Labs:  CBC: Recent Labs    08/18/21 0000 12/14/21 0714  WBC 6.4 7.0  HGB 12.5* 12.2*  HCT 37.4*  34.9*  PLT 366 289    COAGS: Recent Labs    12/14/21 0714  INR 1.0    BMP: Recent Labs    08/18/21 0000 12/14/21 0714  NA 134* 136  K 5.4* 4.7  CL 101 105  CO2 28 21*  GLUCOSE 245* 244*  BUN 42* 42*  CALCIUM 10.0 9.2  CREATININE 1.62* 2.14*  GFRNONAA  --  31*    LIVER FUNCTION TESTS: Recent Labs    08/18/21 0000  BILITOT 0.4  AST 16  ALT 19  PROT 7.0    TUMOR MARKERS: No results for input(s): "AFPTM", "CEA", "CA199", "CHROMGRNA" in the last 8760 hours.   Assessment:  Michael Mcclanahan is a 76yo male with known PAD, bilateral femoral-popliteal disease, with worsening left side, life-style limiting Rutherford 3 class symptoms of claudication.   We discussed his inability to walk/exercise comfortably, and his improved/resolved right sided symptoms after recent treatment.  He is very satisfied with this result, and desires left sided improvement, such that he may remain as active as possible.  This includes walking on the treadmill and in the neighborhood with his wife.      I discussed continuing maximal medical therapy at this time for baseline therapy/reduction of risk factors as recommended by updated AHA guidelines1.  This includes anti-platelet medication, tight blood glucose control to a HbA1c < 7, tight blood pressure control, maximum-dose HMG-CoA reductase inhibitor.  He already does a good job with exercise/physical activity.   We again had a discussion today about the phenotypical expression of vascular disease, and his pattern, which is quite aggressive.  I believe that we will need to aggressively treat the left side such as we did for the right, which is to plan for stenting (we  used supera recently on right).     He voiced some concerns about avoiding amputation, and we also again discussed the TASC guidelines citing only a 2% risk of claudicants progressing to CLI.    After our discussion, he would like to proceed with treatment on the left.  He notes that he has pending cataract surgery, anticipated to be bilateral, which I let him know we will need to work around.    Plan: - Plan for aortoperipheral angiogram, targeting the left side lower extremity, for Rutherford 3 category symptoms. With Dr. Earleen Newport,  [facility] at his convenience.  - He will be getting bilateral cataract surgery in the future.  Should we treat him first, we will need at minimum 6 weeks after angio/intervention such that his dual anti-platelet therapy will not be interrupted early.   - continue maximal medical therapy for cardiovascular risk reduction, including single anti-platelet therapy at this point.  Electronically Signed: Corrie Mckusick 03/23/2022, 4:16 PM   I spent a total of    25 Minutes in face to face in clinical consultation, greater than 50% of which was counseling/coordinating care for lower extremity PAD, possible angiogram and intervention, left lower extremity, Rutherford 3 class symptoms

## 2022-03-24 ENCOUNTER — Encounter: Payer: Self-pay | Admitting: *Deleted

## 2022-03-29 ENCOUNTER — Telehealth: Payer: Self-pay | Admitting: Sports Medicine

## 2022-03-29 MED ORDER — SIMVASTATIN 40 MG PO TABS
40.0000 mg | ORAL_TABLET | Freq: Every day | ORAL | 3 refills | Status: DC
Start: 2022-03-29 — End: 2023-03-27

## 2022-03-29 NOTE — Telephone Encounter (Signed)
Patient came in to office and requested a refill ZOCOR 40 mg sent to [facility] in [place] - Patient stated there is only one [place] in that area. lmr

## 2022-03-29 NOTE — Telephone Encounter (Signed)
Patient notified

## 2022-03-29 NOTE — Telephone Encounter (Signed)
Done

## 2022-03-31 ENCOUNTER — Telehealth (HOSPITAL_COMMUNITY): Payer: Self-pay

## 2022-03-31 NOTE — Telephone Encounter (Signed)
Called to schedule angiogram, no answer, left vm. AW 

## 2022-03-31 NOTE — Telephone Encounter (Signed)
Pt would like to wait before scheduling his procedure with Dr. Earleen Newport until after he has his cataract surgery. He will call the clinic when he is ready to schedule. AW

## 2022-04-12 ENCOUNTER — Other Ambulatory Visit: Payer: Self-pay

## 2022-04-12 DIAGNOSIS — N138 Other obstructive and reflux uropathy: Secondary | ICD-10-CM

## 2022-04-12 MED ORDER — TAMSULOSIN HCL 0.4 MG PO CAPS: 0.4000 mg | ORAL_CAPSULE | Freq: Two times a day (BID) | ORAL | 3 refills | Status: DC

## 2022-04-16 DIAGNOSIS — I10 Essential (primary) hypertension: Secondary | ICD-10-CM | POA: Diagnosis not present

## 2022-04-16 DIAGNOSIS — G4733 Obstructive sleep apnea (adult) (pediatric): Secondary | ICD-10-CM | POA: Diagnosis not present

## 2022-04-21 ENCOUNTER — Emergency Department: Payer: HMO

## 2022-04-21 ENCOUNTER — Other Ambulatory Visit: Payer: Self-pay

## 2022-04-21 ENCOUNTER — Observation Stay
Admission: EM | Admit: 2022-04-21 | Discharge: 2022-04-23 | Disposition: A | Discharge status: Home / Self Care | Payer: HMO | Attending: Internal Medicine | Admitting: Internal Medicine

## 2022-04-21 ENCOUNTER — Encounter: Payer: Self-pay | Admitting: *Deleted

## 2022-04-21 DIAGNOSIS — R079 Chest pain, unspecified: Secondary | ICD-10-CM | POA: Diagnosis not present

## 2022-04-21 DIAGNOSIS — I739 Peripheral vascular disease, unspecified: Secondary | ICD-10-CM | POA: Diagnosis not present

## 2022-04-21 DIAGNOSIS — Z20822 Contact with and (suspected) exposure to covid-19: Secondary | ICD-10-CM | POA: Insufficient documentation

## 2022-04-21 DIAGNOSIS — E1122 Type 2 diabetes mellitus with diabetic chronic kidney disease: Secondary | ICD-10-CM

## 2022-04-21 DIAGNOSIS — K760 Fatty (change of) liver, not elsewhere classified: Secondary | ICD-10-CM | POA: Diagnosis not present

## 2022-04-21 DIAGNOSIS — I2510 Atherosclerotic heart disease of native coronary artery without angina pectoris: Secondary | ICD-10-CM | POA: Diagnosis not present

## 2022-04-21 DIAGNOSIS — R0789 Other chest pain: Principal | ICD-10-CM | POA: Insufficient documentation

## 2022-04-21 DIAGNOSIS — N12 Tubulo-interstitial nephritis, not specified as acute or chronic: Secondary | ICD-10-CM

## 2022-04-21 DIAGNOSIS — E1165 Type 2 diabetes mellitus with hyperglycemia: Secondary | ICD-10-CM | POA: Diagnosis not present

## 2022-04-21 DIAGNOSIS — Z7984 Long term (current) use of oral hypoglycemic drugs: Secondary | ICD-10-CM | POA: Insufficient documentation

## 2022-04-21 DIAGNOSIS — N281 Cyst of kidney, acquired: Secondary | ICD-10-CM | POA: Diagnosis not present

## 2022-04-21 DIAGNOSIS — G4733 Obstructive sleep apnea (adult) (pediatric): Secondary | ICD-10-CM | POA: Insufficient documentation

## 2022-04-21 DIAGNOSIS — N1832 Chronic kidney disease, stage 3b: Secondary | ICD-10-CM

## 2022-04-21 DIAGNOSIS — Z7982 Long term (current) use of aspirin: Secondary | ICD-10-CM | POA: Insufficient documentation

## 2022-04-21 DIAGNOSIS — Z7902 Long term (current) use of antithrombotics/antiplatelets: Secondary | ICD-10-CM | POA: Diagnosis not present

## 2022-04-21 DIAGNOSIS — E1129 Type 2 diabetes mellitus with other diabetic kidney complication: Secondary | ICD-10-CM

## 2022-04-21 DIAGNOSIS — R918 Other nonspecific abnormal finding of lung field: Secondary | ICD-10-CM | POA: Diagnosis not present

## 2022-04-21 DIAGNOSIS — I10 Essential (primary) hypertension: Secondary | ICD-10-CM | POA: Diagnosis present

## 2022-04-21 DIAGNOSIS — R109 Unspecified abdominal pain: Secondary | ICD-10-CM | POA: Insufficient documentation

## 2022-04-21 DIAGNOSIS — E785 Hyperlipidemia, unspecified: Secondary | ICD-10-CM | POA: Diagnosis present

## 2022-04-21 DIAGNOSIS — I129 Hypertensive chronic kidney disease with stage 1 through stage 4 chronic kidney disease, or unspecified chronic kidney disease: Secondary | ICD-10-CM | POA: Diagnosis not present

## 2022-04-21 DIAGNOSIS — I700 Atherosclerosis of aorta: Secondary | ICD-10-CM | POA: Diagnosis not present

## 2022-04-21 DIAGNOSIS — N400 Enlarged prostate without lower urinary tract symptoms: Secondary | ICD-10-CM

## 2022-04-21 LAB — CBC
HCT: 40.3 % (ref 39.0–52.0)
Hemoglobin: 13 g/dL (ref 13.0–17.0)
MCH: 27.4 pg (ref 26.0–34.0)
MCHC: 32.3 g/dL (ref 30.0–36.0)
MCV: 85 fL (ref 80.0–100.0)
Platelets: 291 10*3/uL (ref 150–400)
RBC: 4.74 MIL/uL (ref 4.22–5.81)
RDW: 14.2 % (ref 11.5–15.5)
WBC: 11.7 10*3/uL — ABNORMAL HIGH (ref 4.0–10.5)
nRBC: 0 % (ref 0.0–0.2)

## 2022-04-21 LAB — RESP PANEL BY RT-PCR (FLU A&B, COVID) ARPGX2
Influenza A by PCR: NEGATIVE
Influenza B by PCR: NEGATIVE
SARS Coronavirus 2 by RT PCR: NEGATIVE

## 2022-04-21 LAB — TROPONIN I (HIGH SENSITIVITY)
Troponin I (High Sensitivity): 8 ng/L (ref <18)
Troponin I (High Sensitivity): 9 ng/L (ref <18)

## 2022-04-21 LAB — BASIC METABOLIC PANEL
Anion gap: 11 (ref 5–15)
BUN: 31 mg/dL — ABNORMAL HIGH (ref 8–23)
CO2: 25 mmol/L (ref 22–32)
Calcium: 9.8 mg/dL (ref 8.9–10.3)
Chloride: 102 mmol/L (ref 98–111)
Creatinine, Ser: 1.58 mg/dL — ABNORMAL HIGH (ref 0.61–1.24)
GFR, Estimated: 44 mL/min — ABNORMAL LOW (ref >60)
Glucose, Bld: 356 mg/dL — ABNORMAL HIGH (ref 70–99)
Potassium: 4.5 mmol/L (ref 3.5–5.1)
Sodium: 138 mmol/L (ref 135–145)

## 2022-04-21 MED ORDER — ALUM & MAG HYDROXIDE-SIMETH 200-200-20 MG/5ML PO SUSP
30.0000 mL | Freq: Once | ORAL | Status: AC
  Administered 2022-04-22: 30 mL via ORAL
  Filled 2022-04-21: qty 30

## 2022-04-21 MED ORDER — SODIUM CHLORIDE 0.9 % IV BOLUS (SEPSIS)
1000.0000 mL | Freq: Once | INTRAVENOUS | Status: AC
  Administered 2022-04-22: 1000 mL via INTRAVENOUS

## 2022-04-21 MED ORDER — IOHEXOL 350 MG/ML SOLN
60.0000 mL | Freq: Once | INTRAVENOUS | Status: AC | PRN
  Administered 2022-04-21: 60 mL via INTRAVENOUS

## 2022-04-21 MED ORDER — LIDOCAINE VISCOUS HCL 2 % MT SOLN
15.0000 mL | Freq: Once | OROMUCOSAL | Status: AC
  Administered 2022-04-22: 15 mL via ORAL
  Filled 2022-04-21: qty 15

## 2022-04-21 NOTE — ED Provider Notes (Signed)
11:35 PM  Assumed care at shift change.  Patient was here for chest pain with negative work-up but now complaining of abdominal pain and has oral temp of 99.9 with slight leukocytosis of 11.7.  We will add on additional labs, urine and obtain a CT of his abdomen pelvis.  Will check rectal temperature.  12:51 AM  Pt's rectal temperature is 100.2.  CT scan reviewed and interpreted by myself and the radiologist and shows bilateral mild to moderate nonspecific perinephritic inflammatory fat stranding.  His urine however does not appear infected but we will add on a urine culture.  Given his low-grade temperature, leukocytosis and this finding on CT scan, will give Rocephin for possible pyelonephritis.  Will discuss with the hospitalist.   1:40 AM  Discussed case with Dr. Damita Dunnings on-call for the hospitalist service.  Discussed my concerns for rectal temperature of 100.2, white blood cell count of 11.7 and bilateral mild to moderate perinephric fat stranding.  She states that she will admit for observation.  Patient comfortable with the plan.   Muhammed Teutsch, Delice Bison, DO 04/22/22 [phone]

## 2022-04-21 NOTE — ED Provider Notes (Addendum)
[facility] Provider Note    Event Date/Time   First MD Initiated Contact with Patient 04/21/22 2136     (approximate)  History   Chief Complaint: Chest Pain  HPI  Michael Thomas is a 75 y.o. male with a past medical history of diabetes, hyperlipidemia, presents to the emergency department for chest pain.  According to the patient this afternoon he developed sudden sharp pain in his left chest that seemed to radiate into the left arm at times.  Patient states he also felt chills and was shaking.  Patient denies any known fever however is 99.9 in the emergency department.  Denies any shortness of breath.  Does state pain with deep inspiration although much improved from earlier per patient.  No history of DVT or PE.  No cardiac history.  Physical Exam   Triage Vital Signs: ED Triage Vitals  Enc Vitals Group     BP 04/21/22 1708 (!) 177/95     Pulse Rate 04/21/22 1708 98     Resp 04/21/22 1708 20     Temp 04/21/22 1708 99.9 F (37.7 C)     Temp Source 04/21/22 1708 Oral     SpO2 04/21/22 1708 (!) 9 %     Weight 04/21/22 1705 131 lb (59.4 kg)     Height 04/21/22 1705 '5\' 1"'$  (1.549 m)     Head Circumference --      Peak Flow --      Pain Score 04/21/22 1705 6     Pain Loc --      Pain Edu? --      Excl. in [place]? --     Most recent vital signs: Vitals:   04/21/22 1708 04/21/22 2140  BP: (!) 177/95 (!) 187/101  Pulse: 98 (!) 101  Resp: 20 18  Temp: 99.9 F (37.7 C)   SpO2: (!) 9% 98%    General: Awake, no distress.  CV:  Good peripheral perfusion.  Regular rate and rhythm  Resp:  Normal effort.  Equal breath sounds bilaterally.  Abd:  No distention.  Soft, nontender.  No rebound or guarding.   ED Results / Procedures / Treatments   EKG  EKG viewed and interpreted by myself shows a normal sinus rhythm at 100 bpm with a narrow QRS, normal axis, normal intervals, nonspecific ST changes.  No ST elevation.  RADIOLOGY  I have reviewed  the chest x-ray.  On my interpretation there is no findings in the lungs.  Patient does appear to have constipation. Radiology is read the x-ray is negative for acute abnormality.   MEDICATIONS ORDERED IN ED: Medications - No data to display   IMPRESSION / MDM / [place] / ED COURSE  I reviewed the triage vital signs and the nursing notes.  Patient's presentation is most consistent with acute presentation with potential threat to life or bodily function.  Patient presents emergency department for cute onset of left-sided chest pain, worse with deep inspiration.  States he felt a tingling sensation in his left arm although largely resolved at this point.  Continues to have a mild amount of pleuritic pain although states the pain is much improved from where it was.  No leg pain or swelling.  Overall the patient appears well.  He is hyperglycemic does state a history of diabetes.  Chemistry also shows renal insufficiency with a creatinine of 1.58.  Patient's troponin is negative and CBC reassuring with just slight leukocytosis 11,700.  We  will repeat a troponin.  Patient is borderline tachycardic and borderline febrile we will obtain a COVID/flu swab.  We will also obtain a CTA of the chest to rule out PE.  Patient agreeable to plan of care.  CTA is negative for PE.  No finding in the lungs.  COVID test is negative.  Repeat troponin negative.  Patient states the pain has not moved into the epigastrium and central abdomen.  Continues to have some discomfort to this area.  Denies any pain in the chest or shortness of breath now.  Given this we will proceed with CT imaging to the abdomen with check a urine sample.  Patient care signed out to oncoming provider.  FINAL CLINICAL IMPRESSION(S) / ED DIAGNOSES   Chest pain [place] pain    Note:  This document was prepared using Dragon voice recognition software and may include unintentional dictation errors.   Harvest Dark, MD 04/21/22  2314    Harvest Dark, MD 04/21/22 [phone]

## 2022-04-21 NOTE — ED Triage Notes (Signed)
Pt reports left side chest pain since 1500 today.  Pt states he has nausea.  Pt has numbness in left arm/hand. Denies sob   pt alert  speech clear.

## 2022-04-22 ENCOUNTER — Emergency Department: Payer: HMO

## 2022-04-22 DIAGNOSIS — R109 Unspecified abdominal pain: Secondary | ICD-10-CM | POA: Diagnosis not present

## 2022-04-22 DIAGNOSIS — N281 Cyst of kidney, acquired: Secondary | ICD-10-CM | POA: Diagnosis not present

## 2022-04-22 DIAGNOSIS — I739 Peripheral vascular disease, unspecified: Secondary | ICD-10-CM

## 2022-04-22 DIAGNOSIS — R079 Chest pain, unspecified: Secondary | ICD-10-CM

## 2022-04-22 DIAGNOSIS — K760 Fatty (change of) liver, not elsewhere classified: Secondary | ICD-10-CM | POA: Diagnosis not present

## 2022-04-22 DIAGNOSIS — N12 Tubulo-interstitial nephritis, not specified as acute or chronic: Secondary | ICD-10-CM

## 2022-04-22 DIAGNOSIS — N1832 Chronic kidney disease, stage 3b: Secondary | ICD-10-CM

## 2022-04-22 HISTORY — DX: Tubulo-interstitial nephritis, not specified as acute or chronic: N12

## 2022-04-22 LAB — BASIC METABOLIC PANEL
Anion gap: 7 (ref 5–15)
BUN: 23 mg/dL (ref 8–23)
CO2: 25 mmol/L (ref 22–32)
Calcium: 8.7 mg/dL — ABNORMAL LOW (ref 8.9–10.3)
Chloride: 107 mmol/L (ref 98–111)
Creatinine, Ser: 1.49 mg/dL — ABNORMAL HIGH (ref 0.61–1.24)
GFR, Estimated: 48 mL/min — ABNORMAL LOW (ref >60)
Glucose, Bld: 252 mg/dL — ABNORMAL HIGH (ref 70–99)
Potassium: 4.1 mmol/L (ref 3.5–5.1)
Sodium: 139 mmol/L (ref 135–145)

## 2022-04-22 LAB — URINALYSIS, ROUTINE W REFLEX MICROSCOPIC
Bacteria, UA: NONE SEEN
Bilirubin Urine: NEGATIVE
Glucose, UA: 50 mg/dL — AB
Hgb urine dipstick: NEGATIVE
Ketones, ur: NEGATIVE mg/dL
Leukocytes,Ua: NEGATIVE
Nitrite: NEGATIVE
Protein, ur: 100 mg/dL — AB
Specific Gravity, Urine: 1.036 — ABNORMAL HIGH (ref 1.005–1.030)
Squamous Epithelial / HPF: NONE SEEN (ref 0–5)
pH: 6 (ref 5.0–8.0)

## 2022-04-22 LAB — GLUCOSE, CAPILLARY
Glucose-Capillary: 210 mg/dL — ABNORMAL HIGH (ref 70–99)
Glucose-Capillary: 243 mg/dL — ABNORMAL HIGH (ref 70–99)
Glucose-Capillary: 225 mg/dL — ABNORMAL HIGH (ref 70–99)

## 2022-04-22 LAB — CBC
HCT: 35 % — ABNORMAL LOW (ref 39.0–52.0)
Hemoglobin: 11.5 g/dL — ABNORMAL LOW (ref 13.0–17.0)
MCH: 27.8 pg (ref 26.0–34.0)
MCHC: 32.9 g/dL (ref 30.0–36.0)
MCV: 84.5 fL (ref 80.0–100.0)
Platelets: 234 10*3/uL (ref 150–400)
RBC: 4.14 MIL/uL — ABNORMAL LOW (ref 4.22–5.81)
RDW: 14.3 % (ref 11.5–15.5)
WBC: 7.8 10*3/uL (ref 4.0–10.5)
nRBC: 0 % (ref 0.0–0.2)

## 2022-04-22 LAB — HEMOGLOBIN A1C
Hgb A1c MFr Bld: 8.4 % — ABNORMAL HIGH (ref 4.8–5.6)
Mean Plasma Glucose: 194.38 mg/dL

## 2022-04-22 LAB — HEPATIC FUNCTION PANEL
ALT: 26 U/L (ref 0–44)
AST: 25 U/L (ref 15–41)
Albumin: 4.3 g/dL (ref 3.5–5.0)
Alkaline Phosphatase: 34 U/L — ABNORMAL LOW (ref 38–126)
Bilirubin, Direct: <0.1 mg/dL (ref 0.0–0.2)
Total Bilirubin: 0.1 mg/dL — ABNORMAL LOW (ref 0.3–1.2)
Total Protein: 7.8 g/dL (ref 6.5–8.1)

## 2022-04-22 LAB — CBG MONITORING, ED: Glucose-Capillary: 150 mg/dL — ABNORMAL HIGH (ref 70–99)

## 2022-04-22 LAB — PROCALCITONIN: Procalcitonin: <0.1 ng/mL

## 2022-04-22 LAB — LIPASE, BLOOD: Lipase: 29 U/L (ref 11–51)

## 2022-04-22 MED ORDER — ONDANSETRON HCL 4 MG/2ML IJ SOLN: 4.0000 mg | Freq: Four times a day (QID) | INTRAMUSCULAR | Status: DC | PRN

## 2022-04-22 MED ORDER — SODIUM CHLORIDE 0.9 % IV SOLN: INTRAVENOUS | Status: DC

## 2022-04-22 MED ORDER — SODIUM CHLORIDE 0.9 % IV SOLN
2.0000 g | Freq: Once | INTRAVENOUS | Status: AC
  Administered 2022-04-22: 2 g via INTRAVENOUS
  Filled 2022-04-22: qty 20

## 2022-04-22 MED ORDER — ENOXAPARIN SODIUM 30 MG/0.3ML IJ SOSY
30.0000 mg | PREFILLED_SYRINGE | INTRAMUSCULAR | Status: DC
  Administered 2022-04-22 – 2022-04-23 (×2): 30 mg via SUBCUTANEOUS
  Filled 2022-04-22 (×2): qty 0.3

## 2022-04-22 MED ORDER — ACETAMINOPHEN 650 MG RE SUPP: 650.0000 mg | Freq: Four times a day (QID) | RECTAL | Status: DC | PRN

## 2022-04-22 MED ORDER — SODIUM CHLORIDE 0.9 % IV SOLN
2.0000 g | INTRAVENOUS | Status: DC
  Administered 2022-04-23: 2 g via INTRAVENOUS
  Filled 2022-04-22: qty 20

## 2022-04-22 MED ORDER — IOHEXOL 300 MG/ML  SOLN
50.0000 mL | Freq: Once | INTRAMUSCULAR | Status: AC | PRN
  Administered 2022-04-22: 50 mL via INTRAVENOUS

## 2022-04-22 MED ORDER — ONDANSETRON HCL 4 MG PO TABS: 4.0000 mg | ORAL_TABLET | Freq: Four times a day (QID) | ORAL | Status: DC | PRN

## 2022-04-22 MED ORDER — INSULIN ASPART 100 UNIT/ML IJ SOLN
0.0000 [IU] | Freq: Every day | INTRAMUSCULAR | Status: DC
  Administered 2022-04-22: 2 [IU] via SUBCUTANEOUS
  Filled 2022-04-22: qty 1

## 2022-04-22 MED ORDER — HYDROCODONE-ACETAMINOPHEN 5-325 MG PO TABS: 1.0000 | ORAL_TABLET | ORAL | Status: DC | PRN

## 2022-04-22 MED ORDER — INSULIN ASPART 100 UNIT/ML IJ SOLN
0.0000 [IU] | Freq: Three times a day (TID) | INTRAMUSCULAR | Status: DC
  Administered 2022-04-22: 2 [IU] via SUBCUTANEOUS
  Administered 2022-04-22 (×2): 5 [IU] via SUBCUTANEOUS
  Administered 2022-04-23: 3 [IU] via SUBCUTANEOUS
  Filled 2022-04-22 (×4): qty 1

## 2022-04-22 MED ORDER — ACETAMINOPHEN 325 MG PO TABS: 650.0000 mg | ORAL_TABLET | Freq: Four times a day (QID) | ORAL | Status: DC | PRN

## 2022-04-22 MED ORDER — ACETAMINOPHEN 500 MG PO TABS
1000.0000 mg | ORAL_TABLET | Freq: Once | ORAL | Status: AC
  Administered 2022-04-22: 1000 mg via ORAL
  Filled 2022-04-22: qty 2

## 2022-04-22 MED ORDER — MORPHINE SULFATE (PF) 2 MG/ML IV SOLN: 2.0000 mg | INTRAVENOUS | Status: DC | PRN

## 2022-04-22 NOTE — Assessment & Plan Note (Signed)
Renal function at baseline 

## 2022-04-22 NOTE — Assessment & Plan Note (Signed)
Continue aspirin, simvastatin and fenofibrate

## 2022-04-22 NOTE — Progress Notes (Signed)
Anticoagulation monitoring(Lovenox):  76 yo male ordered Lovenox 40 mg Q24h    Filed Weights   04/21/22 1705  Weight: 59.4 kg (131 lb)   BMI 24.7   Lab Results  Component Value Date   CREATININE 1.58 (H) 04/21/2022   CREATININE 2.14 (H) 12/14/2021   CREATININE 1.62 (H) 08/18/2021   Estimated Creatinine Clearance: 28.5 mL/min (A) (by C-G formula based on SCr of 1.58 mg/dL (H)). Hemoglobin & Hematocrit     Component Value Date/Time   HGB 13.0 04/21/2022 1715   HCT 40.3 04/21/2022 1715     Per Protocol for Patient with estCrcl < 30 ml/min and BMI < 30, will transition to Lovenox 30 mg Q24h.

## 2022-04-22 NOTE — Assessment & Plan Note (Addendum)
Presents with atypical pleuritic type chest pain with radiation to left arm, negative troponin x2 and EKG nonacute CTA chest negative for PE and nonacute

## 2022-04-22 NOTE — Assessment & Plan Note (Signed)
>>  ASSESSMENT AND PLAN FOR PVD (PERIPHERAL VASCULAR DISEASE) (HCC) WRITTEN ON 04/22/2022  1:44 AM BY DUNCAN, HAZEL V, MD  Continue aspirin , simvastatin  and fenofibrate

## 2022-04-22 NOTE — H&P (Signed)
History and Physical    Patient: Michael Thomas URK:[id number] DOB: 1945/08/14 DOA: 04/21/2022 DOS: the patient was seen and examined on 04/22/2022 PCP: Silverio Decamp, MD  Patient coming from: Home  Chief Complaint:  Chief Complaint  Patient presents with   Chest Pain    HPI: Michael Thomas is a 76 y.o. male with medical history significant for DM, HTN, PVD, CKD 3B, BPH, HLD who presents to the ED with left-sided sharp chest pain associated with shaking chills and nausea.  Pain seems to radiate to the left arm and was worsened with deep inspiration.  He also felt the pain in upper abdomen.  Denies vomiting, diarrhea or constipation.  Denies cough or shortness of breath ED course and data review: BP 177/95 with temp 99.9 and pulse 98.  Labs with WBC 11,700 and procalcitonin less than 0.10.  Creatinine at baseline at 1.58.  Glucose 356.  Urinalysis unremarkable.  Troponin 8-9. EKG, personally viewed and interpreted showing NSR at 100 with no acute ST-T wave changes CTA chest negative for acute PE and overall nonacute.  CT abdomen and pelvis showing hepatic steatosis, multiple bilateral simple renal cysts and "Bilateral mild-to-moderate severity nonspecific perinephric inflammatory fat stranding. Correlation with urinalysis is recommended to exclude the presence of an underlying infectious or inflammatory process"  Patient given a dose of Rocephin.  Hospitalist consulted for admission for possible acute pyelonephritis.   Review of Systems: As mentioned in the history of present illness. All other systems reviewed and are negative.  Past Medical History:  Diagnosis Date   Benign prostatic hyperplasia with urinary obstruction 03/25/2015   Diabetes mellitus without complication (HCC)    Hyperlipidemia    Kidney stone    OSA (obstructive sleep apnea)    Vitamin D deficiency    Past Surgical History:  Procedure Laterality Date   DG ARTHRO THUMB*L*     IR ANGIOGRAM  EXTREMITY BILATERAL  02/20/2018   IR ANGIOGRAM EXTREMITY BILATERAL  04/05/2019   IR ANGIOGRAM EXTREMITY LEFT  05/07/2020   IR ANGIOGRAM EXTREMITY RIGHT  02/15/2021   IR ANGIOGRAM EXTREMITY RIGHT  12/14/2021   IR ANGIOGRAM SELECTIVE EACH ADDITIONAL VESSEL  02/20/2018   IR FEM POP ART ATHERECT INC PTA MOD SED  02/20/2018   IR FEM POP ART ATHERECT INC PTA MOD SED  05/14/2019   IR FEM POP ART ATHERECT INC PTA MOD SED  05/07/2020   IR FEM POP ART ATHERECT INC PTA MOD SED  02/15/2021   IR FEM POP ART STENT INC PTA MOD SED  12/14/2021   IR INTRAVASCULAR ULTRASOUND NON CORONARY  02/15/2021   IR INTRAVASCULAR ULTRASOUND NON CORONARY  12/14/2021   IR RADIOLOGIST EVAL & MGMT  02/01/2018   IR RADIOLOGIST EVAL & MGMT  03/20/2018   IR RADIOLOGIST EVAL & MGMT  07/25/2018   IR RADIOLOGIST EVAL & MGMT  03/07/2019   IR RADIOLOGIST EVAL & MGMT  04/23/2019   IR RADIOLOGIST EVAL & MGMT  06/18/2019   IR RADIOLOGIST EVAL & MGMT  04/29/2020   IR RADIOLOGIST EVAL & MGMT  06/04/2020   IR RADIOLOGIST EVAL & MGMT  01/20/2021   IR RADIOLOGIST EVAL & MGMT  04/27/2021   IR RADIOLOGIST EVAL & MGMT  08/12/2021   IR RADIOLOGIST EVAL & MGMT  11/02/2021   IR RADIOLOGIST EVAL & MGMT  11/15/2021   IR RADIOLOGIST EVAL & MGMT  01/26/2022   IR RADIOLOGIST EVAL & MGMT  03/23/2022   IR US GUIDE VASC ACCESS LEFT  04/05/2019   IR US GUIDE VASC ACCESS LEFT  02/15/2021   IR US GUIDE VASC ACCESS LEFT  12/14/2021   IR US GUIDE VASC ACCESS RIGHT  02/20/2018   IR US GUIDE VASC ACCESS RIGHT  05/14/2019   IR US GUIDE VASC ACCESS RIGHT  05/07/2020   NECK SURGERY     VASECTOMY     Social History:  reports that he has never smoked. He has never used smokeless tobacco. He reports that he does not drink alcohol and does not use drugs.  Allergies  Allergen Reactions   Amoxicillin Other (See Comments)    Fatigue and Myalgia and Rigors x2 attempts    Family History  Problem Relation Age of Onset   Stroke Mother    Diabetes Mellitus II Mother    Prostate cancer  Neg Hx    Kidney cancer Neg Hx    Bladder Cancer Neg Hx     Prior to Admission medications   Medication Sig Start Date End Date Taking? Authorizing Provider  AMBULATORY NON FORMULARY MEDICATION One Touch Ultra 2 glucometer 05/15/20   Silverio Decamp, MD  aspirin EC 81 MG tablet Take 81 mg by mouth at bedtime.    [provider]  Blood Glucose Monitoring Suppl (ONETOUCH VERIO REFLECT) w/Device KIT Check fasting blood sugar every morning and 2 hours after largest meal of the day., 11/22/21   Silverio Decamp, MD  clobetasol ointment (TEMOVATE) 1.74 % Apply 1 Application topically 2 (two) times daily as needed (blisters). 03/15/22   Silverio Decamp, MD  clopidogrel (PLAVIX) 75 MG tablet Take 1 tablet (75 mg total) by mouth daily. 11/22/21   Docia Barrier, PA  fenofibrate 160 MG tablet Take 1 tablet (160 mg total) by mouth daily. 08/18/21   Silverio Decamp, MD  fluticasone (FLONASE) 50 MCG/ACT nasal spray Place 1 spray into both nostrils daily as needed for allergies or rhinitis.    [provider]  glipiZIDE (GLUCOTROL XL) 10 MG 24 hr tablet TAKE 1 TABLET BY MOUTH EVERY DAY WITH BREAKFAST 02/16/22   Silverio Decamp, MD  glucose blood (ONETOUCH VERIO) test strip Check fasting blood sugar every morning and 2 hours after largest meal of the day., 11/22/21   Silverio Decamp, MD  lisinopril-hydrochlorothiazide (ZESTORETIC) 20-25 MG tablet Take 1 tablet by mouth daily. 02/16/22   Silverio Decamp, MD  magnesium oxide (MAG-OX) 400 MG tablet Take 2 tablets (800 mg total) by mouth at bedtime. Patient taking differently: Take 400 mg by mouth at bedtime. 08/18/21   Silverio Decamp, MD  metFORMIN (GLUCOPHAGE) 1000 MG tablet TAKE 1 TABLET (1,000 MG TOTAL) BY MOUTH 2 (TWO) TIMES DAILY WITH A MEAL. 09/08/21   Silverio Decamp, MD  omega-3 acid ethyl esters (LOVAZA) 1 g capsule Take 2 capsules (2 g total) by mouth 2 (two) times daily.  02/16/22   Silverio Decamp, MD  OneTouch Delica Lancets 94W MISC Use up to 3 times daily. 08/18/21   Silverio Decamp, MD  pioglitazone (ACTOS) 30 MG tablet Take 1 tablet (30 mg total) by mouth daily. 02/16/22   Silverio Decamp, MD  simvastatin (ZOCOR) 40 MG tablet Take 1 tablet (40 mg total) by mouth daily at 6 PM. 03/29/22   Silverio Decamp, MD  sitaGLIPtin (JANUVIA) 100 MG tablet Take 1 tablet (100 mg total) by mouth daily. 08/18/21   Silverio Decamp, MD  tamsulosin (FLOMAX) 0.4 MG CAPS capsule Take 1 capsule (0.4  mg total) by mouth 2 (two) times daily. 04/12/22   Silverio Decamp, MD    Physical Exam: Vitals:   04/21/22 2200 04/22/22 0007 04/22/22 0007 04/22/22 0100  BP: (!) 160/85 139/89  (!) 167/83  Pulse: 93 86  85  Resp: '18 18  16  ' Temp:   98.6 F (37 C)   TempSrc:   Oral   SpO2: 96% 97%  98%  Weight:      Height:       Physical Exam Vitals and nursing note reviewed.  Constitutional:      General: He is not in acute distress. HENT:     Head: Normocephalic and atraumatic.  Cardiovascular:     Rate and Rhythm: Normal rate and regular rhythm.     Heart sounds: Normal heart sounds.  Pulmonary:     Effort: Pulmonary effort is normal.     Breath sounds: Normal breath sounds.  Abdominal:     Palpations: Abdomen is soft.     Tenderness: There is no abdominal tenderness.  Neurological:     Mental Status: Mental status is at baseline.     Labs on Admission: I have personally reviewed following labs and imaging studies  CBC: Recent Labs  Lab 04/21/22 1715  WBC 11.7*  HGB 13.0  HCT 40.3  MCV 85.0  PLT 295   Basic Metabolic Panel: Recent Labs  Lab 04/21/22 1715  NA 138  K 4.5  CL 102  CO2 25  GLUCOSE 356*  BUN 31*  CREATININE 1.58*  CALCIUM 9.8   GFR: Estimated Creatinine Clearance: 28.5 mL/min (A) (by C-G formula based on SCr of 1.58 mg/dL (H)). Liver Function Tests: Recent Labs  Lab 04/21/22 2156  AST 25  ALT 26   ALKPHOS 34*  BILITOT 0.1*  PROT 7.8  ALBUMIN 4.3   Recent Labs  Lab 04/21/22 2156  LIPASE 29   No results for input(s): "AMMONIA" in the last 168 hours. Coagulation Profile: No results for input(s): "INR", "PROTIME" in the last 168 hours. Cardiac Enzymes: No results for input(s): "CKTOTAL", "CKMB", "CKMBINDEX", "TROPONINI" in the last 168 hours. BNP (last 3 results) No results for input(s): "PROBNP" in the last 8760 hours. HbA1C: No results for input(s): "HGBA1C" in the last 72 hours. CBG: No results for input(s): "GLUCAP" in the last 168 hours. Lipid Profile: No results for input(s): "CHOL", "HDL", "LDLCALC", "TRIG", "CHOLHDL", "LDLDIRECT" in the last 72 hours. Thyroid Function Tests: No results for input(s): "TSH", "T4TOTAL", "FREET4", "T3FREE", "THYROIDAB" in the last 72 hours. Anemia Panel: No results for input(s): "VITAMINB12", "FOLATE", "FERRITIN", "TIBC", "IRON", "RETICCTPCT" in the last 72 hours. Urine analysis:    Component Value Date/Time   COLORURINE STRAW (A) 04/22/2022 0009   APPEARANCEUR CLEAR (A) 04/22/2022 0009   APPEARANCEUR Clear 04/30/2015 1427   LABSPEC 1.036 (H) 04/22/2022 0009   PHURINE 6.0 04/22/2022 0009   GLUCOSEU 50 (A) 04/22/2022 0009   HGBUR NEGATIVE 04/22/2022 0009   BILIRUBINUR NEGATIVE 04/22/2022 0009   BILIRUBINUR Negative 04/30/2015 1427   KETONESUR NEGATIVE 04/22/2022 0009   PROTEINUR 100 (A) 04/22/2022 0009   NITRITE NEGATIVE 04/22/2022 0009   LEUKOCYTESUR NEGATIVE 04/22/2022 0009    Radiological Exams on Admission: CT ABDOMEN PELVIS W CONTRAST  Result Date: 04/22/2022 CLINICAL DATA:  Abdominal pain. EXAM: CT ABDOMEN AND PELVIS WITH CONTRAST TECHNIQUE: Multidetector CT imaging of the abdomen and pelvis was performed using the standard protocol following bolus administration of intravenous contrast. RADIATION DOSE REDUCTION: This exam was performed according to  the departmental dose-optimization program which includes automated  exposure control, adjustment of the mA and/or kV according to patient size and/or use of iterative reconstruction technique. CONTRAST:  19m OMNIPAQUE IOHEXOL 300 MG/ML  SOLN COMPARISON:  None Available. FINDINGS: Lower chest: No acute abnormality. Hepatobiliary: There is diffuse fatty infiltration of the liver parenchyma. No focal liver abnormality is seen. No gallstones, gallbladder wall thickening, or biliary dilatation. Pancreas: Unremarkable. No pancreatic ductal dilatation or surrounding inflammatory changes. Spleen: Normal in size without focal abnormality. Adrenals/Urinary Tract: Adrenal glands are unremarkable. Kidneys are normal in size, without renal calculi or hydronephrosis. Multiple bilateral simple renal cysts are seen. Bilateral mild-to-moderate severity nonspecific perinephric inflammatory fat stranding is seen. Bladder is unremarkable. Stomach/Bowel: Stomach is within normal limits. Appendix appears normal. No evidence of bowel wall thickening, distention, or inflammatory changes. Vascular/Lymphatic: Aortic atherosclerosis. No enlarged abdominal or pelvic lymph nodes. Reproductive: There is moderate to marked severity prostate gland enlargement. Other: No abdominal wall hernia or abnormality. No abdominopelvic ascites. Musculoskeletal: No acute or significant osseous findings. IMPRESSION: 1. Hepatic steatosis. 2. Bilateral mild-to-moderate severity nonspecific perinephric inflammatory fat stranding. Correlation with urinalysis is recommended to exclude the presence of an underlying infectious or inflammatory process. 3. Multiple bilateral simple renal cysts. No additional follow-up or imaging is recommended. This recommendation follows ACR consensus guidelines: Management of the Incidental Renal Mass on CT: A White Paper of the ACR Incidental Findings Committee. J Am Coll Radiol [id number] 4. Moderate to marked severity prostatomegaly. 5. Aortic atherosclerosis. Aortic Atherosclerosis  (ICD10-I70.0). Electronically Signed   By: TVirgina NorfolkM.D.   On: 04/22/2022 00:37   CT Angio Chest PE W and/or Wo Contrast  Result Date: 04/21/2022 CLINICAL DATA:  Left-sided chest pain PE suspected, high probability EXAM: CT ANGIOGRAPHY CHEST WITH CONTRAST TECHNIQUE: Multidetector CT imaging of the chest was performed using the standard protocol during bolus administration of intravenous contrast. Multiplanar CT image reconstructions and MIPs were obtained to evaluate the vascular anatomy. RADIATION DOSE REDUCTION: This exam was performed according to the departmental dose-optimization program which includes automated exposure control, adjustment of the mA and/or kV according to patient size and/or use of iterative reconstruction technique. CONTRAST:  660mOMNIPAQUE IOHEXOL 350 MG/ML SOLN COMPARISON:  Radiographs earlier today FINDINGS: Cardiovascular: Satisfactory opacification of the pulmonary arteries to the segmental level. No evidence of pulmonary embolism. Normal heart size. No pericardial effusion. Coronary and aortic atherosclerotic calcification. Mediastinum/Nodes: No enlarged mediastinal, hilar, or axillary lymph nodes. Thyroid gland, trachea, and esophagus demonstrate no significant findings. Lungs/Pleura: Lungs are clear. No pleural effusion or pneumothorax. Upper Abdomen: No acute abnormality. Musculoskeletal: No chest wall abnormality. No acute osseous findings. Review of the MIP images confirms the above findings. IMPRESSION: Negative for acute pulmonary embolism. No acute findings in the chest. Coronary and aortic atherosclerotic calcification. Electronically Signed   By: TyPlacido Sou.D.   On: 04/21/2022 22:41   DG Chest 2 View  Result Date: 04/21/2022 CLINICAL DATA:  Chest pain. EXAM: CHEST - 2 VIEW COMPARISON:  None Available. FINDINGS: The heart size and mediastinal contours are within normal limits. Both lungs are clear. The visualized skeletal structures are unremarkable.  IMPRESSION: No active cardiopulmonary disease. Electronically Signed   By: JaMarijo Conception.D.   On: 04/21/2022 18:24     Data Reviewed: Relevant notes from primary care and specialist visits, past discharge summaries as available in EHR, including Care Everywhere. Prior diagnostic testing as pertinent to current admission diagnoses Updated medications and problem lists for reconciliation ED  course, including vitals, labs, imaging, treatment and response to treatment Triage notes, nursing and pharmacy notes and ED provider's notes Notable results as noted in HPI   Assessment and Plan: * Left flank pain Possible pyelonephritis on CT Inflammatory stranding around kidneys on CT, urinalysis was sterile but patient has a low-grade temp of 99.9 and WBC 11,000 and procalcitonin less than 0.1 Received Rocephin in the ED We will hold off on additional antibiotics pending urine culture Pain control  Chest pain, suspect noncardiac Presents with atypical pleuritic type chest pain with radiation to left arm, negative troponin x2 and EKG nonacute CTA chest negative for PE and nonacute   PVD (peripheral vascular disease) (HCC) Continue aspirin, simvastatin and fenofibrate  Stage 3b chronic kidney disease ([place]) Renal function at baseline  Uncontrolled type 2 diabetes mellitus with hyperglycemia, without long-term current use of insulin ([place]) Blood sugar 356 Sliding scale insulin.   Might benefit from starting basal insulin to replace multiple oral hypoglycemics.  Currently on glipizide 10, metformin at 1000 mg and Actos 30 and Januvia 100 Follow A1c  BPH (benign prostatic hyperplasia) Continue tamsulosin  Essential hypertension, benign Slightly elevated.  Continue lisinopril HCTZ        DVT prophylaxis: Lovenox  Consults: none  Advance Care Planning: full code  Family Communication: none  Disposition Plan: Back to previous home environment  Severity of Illness: The  appropriate patient status for this patient is OBSERVATION. Observation status is judged to be reasonable and necessary in order to provide the required intensity of service to ensure the patient's safety. The patient's presenting symptoms, physical exam findings, and initial radiographic and laboratory data in the context of their medical condition is felt to place them at decreased risk for further clinical deterioration. Furthermore, it is anticipated that the patient will be medically stable for discharge from the hospital within 2 midnights of admission.   Author: Athena Masse, MD 04/22/2022 1:50 AM  For on call review [email or web address].

## 2022-04-22 NOTE — Assessment & Plan Note (Addendum)
Possible pyelonephritis on CT Inflammatory stranding around kidneys on CT, urinalysis was sterile but patient has a low-grade temp of 99.9 and WBC 11,000 and procalcitonin less than 0.1 Received Rocephin in the ED We will hold off on additional antibiotics pending urine culture Pain control

## 2022-04-22 NOTE — Assessment & Plan Note (Signed)
Blood sugar 356 Sliding scale insulin.   Might benefit from starting basal insulin to replace multiple oral hypoglycemics.  Currently on glipizide 10, metformin at 1000 mg and Actos 30 and Januvia 100 Follow A1c

## 2022-04-22 NOTE — Plan of Care (Signed)
  Problem: Education: Goal: Knowledge of General Education information will improve Description: Including pain rating scale, medication(s)/side effects and non-pharmacologic comfort measures Outcome: Progressing   Problem: Health Behavior/Discharge Planning: Goal: Ability to manage health-related needs will improve Outcome: Progressing   Problem: Clinical Measurements: Goal: Ability to maintain clinical measurements within normal limits will improve Outcome: Progressing   Problem: Clinical Measurements: Goal: Diagnostic test results will improve Outcome: Progressing   Problem: Clinical Measurements: Goal: Respiratory complications will improve Outcome: Progressing   Problem: Nutrition: Goal: Adequate nutrition will be maintained Outcome: Progressing

## 2022-04-22 NOTE — Assessment & Plan Note (Addendum)
Slightly elevated.  Continue lisinopril HCTZ

## 2022-04-22 NOTE — ED Notes (Signed)
Lab called and urine culture requested at this time

## 2022-04-22 NOTE — Progress Notes (Signed)
Inpatient Diabetes Program Recommendations  AACE/ADA: New Consensus Statement on Inpatient Glycemic Control (2015)  Target Ranges:  Prepandial:   less than 140 mg/dL      Peak postprandial:   less than 180 mg/dL (1-2 hours)      Critically ill patients:  140 - 180 mg/dL   Lab Results  Component Value Date   GLUCAP 150 (H) 04/22/2022   HGBA1C 8.4 (H) 04/21/2022    Review of Glycemic Control  Latest Reference Range & Units 04/22/22 06:52  Glucose-Capillary 70 - 99 mg/dL 150 (H)   Diabetes history: DM  Outpatient Diabetes medications: Glucotrol XL 10 mg daily, Metformin 1000 mg bid, Actos 30 mg daily (not taking), Januvia 100 mg q HS Current orders for Inpatient glycemic control:  Novolog 0-15 units tid with meals and HS  Inpatient Diabetes Program Recommendations:   If blood sugars >180 mg/dL, consider adding Semglee 8 units daily.   Thanks,  Adah Perl, RN, BC-ADM Inpatient Diabetes Coordinator Pager [phone]  (8a-5p)

## 2022-04-22 NOTE — Assessment & Plan Note (Signed)
Continue tamsulosin.

## 2022-04-22 NOTE — Progress Notes (Signed)
Brief same day note:  Patient is a 76 year old male with history of diabetes type 2, hypertension, CKD stage IIIb, hyperlipidemia who presented with left-sided upper abdomen pain, chest pain, chills, nausea.  On presentation, he was mildly hypertensive, mildly febrile.  He reported that he had fever at home.  Labs showed mild leukocytosis, hyperglycemia.  CT abdomen/pelvis showed bilateral mild to moderate severity nonspecific perinephric inflammatory fat stranding.  Urinalysis was not suspicious for UTI.  Procalcitonin was nonreassuring. Patient was admitted for the suspicion of pyelonephritis.  Urine culture, blood culture sent.  Started on ceftriaxone. Patient seen and examined at the bedside this morning.  He remains hemodynamically stable, afebrile during my evaluation.  Denies any abdominal pain or chest pain today.  Denies any dysuria.  Did not have any bilateral costovertebral angle tenderness.  Abdomen was benign on examination. We will continue current management with antibiotics pending urine culture and blood culture.  If cultures remain negative by tomorrow, we can consider discharging him on oral antibiotics if needed. I agree with assessment and plan of Dr. Damita Dunnings

## 2022-04-23 DIAGNOSIS — R109 Unspecified abdominal pain: Secondary | ICD-10-CM | POA: Diagnosis not present

## 2022-04-23 LAB — BASIC METABOLIC PANEL
Anion gap: 8 (ref 5–15)
BUN: 22 mg/dL (ref 8–23)
CO2: 22 mmol/L (ref 22–32)
Calcium: 9.2 mg/dL (ref 8.9–10.3)
Chloride: 108 mmol/L (ref 98–111)
Creatinine, Ser: 1.56 mg/dL — ABNORMAL HIGH (ref 0.61–1.24)
GFR, Estimated: 45 mL/min — ABNORMAL LOW (ref >60)
Glucose, Bld: 190 mg/dL — ABNORMAL HIGH (ref 70–99)
Potassium: 4.3 mmol/L (ref 3.5–5.1)
Sodium: 138 mmol/L (ref 135–145)

## 2022-04-23 LAB — CBC
HCT: 35.8 % — ABNORMAL LOW (ref 39.0–52.0)
Hemoglobin: 11.8 g/dL — ABNORMAL LOW (ref 13.0–17.0)
MCH: 27.6 pg (ref 26.0–34.0)
MCHC: 33 g/dL (ref 30.0–36.0)
MCV: 83.6 fL (ref 80.0–100.0)
Platelets: 256 10*3/uL (ref 150–400)
RBC: 4.28 MIL/uL (ref 4.22–5.81)
RDW: 14.4 % (ref 11.5–15.5)
WBC: 6.6 10*3/uL (ref 4.0–10.5)
nRBC: 0 % (ref 0.0–0.2)

## 2022-04-23 LAB — URINE CULTURE: Culture: NO GROWTH

## 2022-04-23 LAB — GLUCOSE, CAPILLARY: Glucose-Capillary: 186 mg/dL — ABNORMAL HIGH (ref 70–99)

## 2022-04-23 MED ORDER — LISINOPRIL-HYDROCHLOROTHIAZIDE 20-25 MG PO TABS: 1.0000 | ORAL_TABLET | Freq: Every day | ORAL | Status: DC

## 2022-04-23 MED ORDER — LISINOPRIL 20 MG PO TABS
20.0000 mg | ORAL_TABLET | Freq: Every day | ORAL | Status: DC
  Administered 2022-04-23: 20 mg via ORAL
  Filled 2022-04-23: qty 1

## 2022-04-23 MED ORDER — DOCUSATE SODIUM 100 MG PO CAPS
100.0000 mg | ORAL_CAPSULE | Freq: Every day | ORAL | Status: DC
  Administered 2022-04-23: 100 mg via ORAL
  Filled 2022-04-23: qty 1

## 2022-04-23 MED ORDER — HYDROCHLOROTHIAZIDE 25 MG PO TABS
25.0000 mg | ORAL_TABLET | Freq: Every day | ORAL | Status: DC
  Administered 2022-04-23: 25 mg via ORAL
  Filled 2022-04-23: qty 1

## 2022-04-23 MED ORDER — POLYETHYLENE GLYCOL 3350 17 G PO PACK
17.0000 g | PACK | Freq: Every day | ORAL | Status: DC
  Filled 2022-04-23: qty 1

## 2022-04-23 NOTE — Plan of Care (Signed)

## 2022-04-23 NOTE — Discharge Summary (Signed)
Physician Discharge Summary  Michael Thomas VOZ:[id number] DOB: [birth year] DOA: 04/21/2022  PCP: Silverio Decamp, MD  Admit date: 04/21/2022 Discharge date: 04/23/2022  Admitted From: Home Disposition: Home  Recommendations for Outpatient Follow-up:  Follow up with PCP in 1-2 weeks Please obtain BMP/CBC in one week Drink plenty of water, avoid use of NSAIDs  Home Health: None Equipment/Devices: None Discharge Condition: Stable CODE STATUS: Full code Diet recommendation: No sodium diet/low-carb diet  Brief/Interim Summary:  Michael Thomas is a 76 y.o. male with medical history significant for DM, HTN, PVD, CKD 3B, BPH, HLD who presents to the ED with left-sided sharp chest pain/abdomen pain associated with shaking chills and nausea.  ED course: BP 177/95 with temp 99.9 and pulse 98.  Labs with WBC 11,700 and procalcitonin less than 0.10.  Creatinine at baseline at 1.58.  Glucose 356.  Urinalysis unremarkable.  Troponin 8-9. EKG, personally viewed and interpreted showing NSR at 100 with no acute ST-T wave changes. CTA chest negative for acute PE and overall nonacute.  CT abdomen and pelvis showing hepatic steatosis, multiple bilateral simple renal cysts and "Bilateral mild-to-moderate severity nonspecific perinephric inflammatory fat stranding. Correlation with urinalysis is recommended to exclude the presence of an underlying infectious or inflammatory process"   Patient given a dose of Rocephin.  Hospitalist consulted for admission for possible acute pyelonephritis.   Left flank pain: -CT abdomen/pelvis as above.  Urine culture shows no growth.  Blood culture negative till date.  He remained afebrile.  Leukocytosis improved. -Comfortable going home  Atypical chest pain: -Cardiac work-up negative including troponin, EKG shows nomarkable -CTA chest negative for PE  CKD stage IIIb: -Baseline  PVD: -Continued as current, statin and fenofibrate  Uncontrolled  type 2 diabetes with hyperglycemia without long-term use of insulin: -Started on sliding scale insulin while patient was in the hospital.  A1c 8.4%. -Resumed home medicine at the time of discharge metformin, glipizide, Actos and Januvia -Follow-up with PCP outpatient.  BPH: -CT abdomen/pelvis shows prostatomegaly -Continued Flomax  Hypertension: Blood pressure slightly elevated. -Resumed lisinopril-HCTZ.  Hyperlipidemia: Continue statin   Discharge Diagnoses:  Left flank pain Atypical chest pain CKD stage IIIb PVD Controlled type 2 diabetes with hyperglycemia without long-term use of insulin BPH Hypertension Hyperlipidemia  Discharge Instructions  Discharge Instructions     Diet - low sodium heart healthy   Complete by: As directed    Increase activity slowly   Complete by: As directed       Allergies as of 04/23/2022       Reactions   Amoxicillin Other (See Comments)   Fatigue and Myalgia and Rigors x2 attempts        Medication List     TAKE these medications    AMBULATORY NON FORMULARY MEDICATION One Touch Ultra 2 glucometer   aspirin EC 81 MG tablet Take 81 mg by mouth at bedtime.   clobetasol ointment 0.05 % Commonly known as: TEMOVATE Apply 1 Application topically 2 (two) times daily as needed (blisters).   fenofibrate 160 MG tablet Take 1 tablet (160 mg total) by mouth daily.   fluticasone 50 MCG/ACT nasal spray Commonly known as: FLONASE Place 1 spray into both nostrils daily as needed for allergies or rhinitis.   glipiZIDE 10 MG 24 hr tablet Commonly known as: GLUCOTROL XL TAKE 1 TABLET BY MOUTH EVERY DAY WITH BREAKFAST   lisinopril-hydrochlorothiazide 20-25 MG tablet Commonly known as: ZESTORETIC Take 1 tablet by mouth daily.   magnesium oxide 400 MG tablet  Commonly known as: MAG-OX Take 2 tablets (800 mg total) by mouth at bedtime. What changed:  how much to take when to take this reasons to take this   metFORMIN 1000 MG  tablet Commonly known as: GLUCOPHAGE TAKE 1 TABLET (1,000 MG TOTAL) BY MOUTH 2 (TWO) TIMES DAILY WITH A MEAL.   omega-3 acid ethyl esters 1 g capsule Commonly known as: LOVAZA Take 2 capsules (2 g total) by mouth 2 (two) times daily.   OneTouch Delica Lancets 17B Misc Use up to 3 times daily.   OneTouch Verio Reflect w/Device Kit Check fasting blood sugar every morning and 2 hours after largest meal of the day.,   OneTouch Verio test strip Generic drug: glucose blood Check fasting blood sugar every morning and 2 hours after largest meal of the day.,   pioglitazone 30 MG tablet Commonly known as: Actos Take 1 tablet (30 mg total) by mouth daily.   simvastatin 40 MG tablet Commonly known as: ZOCOR Take 1 tablet (40 mg total) by mouth daily at 6 PM. What changed: when to take this   sitaGLIPtin 100 MG tablet Commonly known as: Januvia Take 1 tablet (100 mg total) by mouth daily. What changed: when to take this   tamsulosin 0.4 MG Caps capsule Commonly known as: FLOMAX Take 1 capsule (0.4 mg total) by mouth 2 (two) times daily.        Follow-up Information     Schedule an appointment as soon as possible for a visit  with Silverio Decamp, MD.   Specialties: Family Medicine, Sports Medicine, Radiology Contact information: [address] [place] [address] [phone]                Allergies  Allergen Reactions   Amoxicillin Other (See Comments)    Fatigue and Myalgia and Rigors x2 attempts    Consultations: None   Procedures/Studies: CT ABDOMEN PELVIS W CONTRAST  Result Date: 04/22/2022 CLINICAL DATA:  Abdominal pain. EXAM: CT ABDOMEN AND PELVIS WITH CONTRAST TECHNIQUE: Multidetector CT imaging of the abdomen and pelvis was performed using the standard protocol following bolus administration of intravenous contrast. RADIATION DOSE REDUCTION: This exam was performed according to the departmental dose-optimization program which  includes automated exposure control, adjustment of the mA and/or kV according to patient size and/or use of iterative reconstruction technique. CONTRAST:  70m OMNIPAQUE IOHEXOL 300 MG/ML  SOLN COMPARISON:  None Available. FINDINGS: Lower chest: No acute abnormality. Hepatobiliary: There is diffuse fatty infiltration of the liver parenchyma. No focal liver abnormality is seen. No gallstones, gallbladder wall thickening, or biliary dilatation. Pancreas: Unremarkable. No pancreatic ductal dilatation or surrounding inflammatory changes. Spleen: Normal in size without focal abnormality. Adrenals/Urinary Tract: Adrenal glands are unremarkable. Kidneys are normal in size, without renal calculi or hydronephrosis. Multiple bilateral simple renal cysts are seen. Bilateral mild-to-moderate severity nonspecific perinephric inflammatory fat stranding is seen. Bladder is unremarkable. Stomach/Bowel: Stomach is within normal limits. Appendix appears normal. No evidence of bowel wall thickening, distention, or inflammatory changes. Vascular/Lymphatic: Aortic atherosclerosis. No enlarged abdominal or pelvic lymph nodes. Reproductive: There is moderate to marked severity prostate gland enlargement. Other: No abdominal wall hernia or abnormality. No abdominopelvic ascites. Musculoskeletal: No acute or significant osseous findings. IMPRESSION: 1. Hepatic steatosis. 2. Bilateral mild-to-moderate severity nonspecific perinephric inflammatory fat stranding. Correlation with urinalysis is recommended to exclude the presence of an underlying infectious or inflammatory process. 3. Multiple bilateral simple renal cysts. No additional follow-up or imaging is recommended. This recommendation  follows ACR consensus guidelines: Management of the Incidental Renal Mass on CT: A White Paper of the ACR Incidental Findings Committee. J Am Coll Radiol [phone]. 4. Moderate to marked severity prostatomegaly. 5. Aortic atherosclerosis. Aortic  Atherosclerosis (ICD10-I70.0). Electronically Signed   By: Virgina Norfolk M.D.   On: 04/22/2022 00:37   CT Angio Chest PE W and/or Wo Contrast  Result Date: 04/21/2022 CLINICAL DATA:  Left-sided chest pain PE suspected, high probability EXAM: CT ANGIOGRAPHY CHEST WITH CONTRAST TECHNIQUE: Multidetector CT imaging of the chest was performed using the standard protocol during bolus administration of intravenous contrast. Multiplanar CT image reconstructions and MIPs were obtained to evaluate the vascular anatomy. RADIATION DOSE REDUCTION: This exam was performed according to the departmental dose-optimization program which includes automated exposure control, adjustment of the mA and/or kV according to patient size and/or use of iterative reconstruction technique. CONTRAST:  49m OMNIPAQUE IOHEXOL 350 MG/ML SOLN COMPARISON:  Radiographs earlier today FINDINGS: Cardiovascular: Satisfactory opacification of the pulmonary arteries to the segmental level. No evidence of pulmonary embolism. Normal heart size. No pericardial effusion. Coronary and aortic atherosclerotic calcification. Mediastinum/Nodes: No enlarged mediastinal, hilar, or axillary lymph nodes. Thyroid gland, trachea, and esophagus demonstrate no significant findings. Lungs/Pleura: Lungs are clear. No pleural effusion or pneumothorax. Upper Abdomen: No acute abnormality. Musculoskeletal: No chest wall abnormality. No acute osseous findings. Review of the MIP images confirms the above findings. IMPRESSION: Negative for acute pulmonary embolism. No acute findings in the chest. Coronary and aortic atherosclerotic calcification. Electronically Signed   By: TPlacido SouM.D.   On: 04/21/2022 22:41   DG Chest 2 View  Result Date: 04/21/2022 CLINICAL DATA:  Chest pain. EXAM: CHEST - 2 VIEW COMPARISON:  None Available. FINDINGS: The heart size and mediastinal contours are within normal limits. Both lungs are clear. The visualized skeletal structures are  unremarkable. IMPRESSION: No active cardiopulmonary disease. Electronically Signed   By: JMarijo ConceptionM.D.   On: 04/21/2022 18:24      Subjective: Patient seen and examined.  Resting comfortably on the bed.  Reports that overall he feels better.  No abdominal pain, nausea, vomiting.  No fever overnight.  Comfortable going home today.  Discharge Exam: Vitals:   04/23/22 0325 04/23/22 0800  BP: (!) 154/80 (!) 170/88  Pulse: 63 66  Resp: 16 (!) 22  Temp: 98 F (36.7 C) 97.8 F (36.6 C)  SpO2: 98% 99%   Vitals:   04/22/22 0830 04/22/22 0909 04/23/22 0325 04/23/22 0800  BP: (!) 142/71 (!) 150/82 (!) 154/80 (!) 170/88  Pulse: 84 76 63 66  Resp: _0 (!) 22  Temp:  98 F (36.7 C) 98 F (36.7 C) 97.8 F (36.6 C)  TempSrc:   Oral Oral  SpO2: 99% 98% 98% 99%  Weight:      Height:        General: Pt is alert, awake, not in acute distress, on room air, communicating well Cardiovascular: RRR, S1/S2 +, no rubs, no gallops Respiratory: CTA bilaterally, no wheezing, no rhonchi Abdominal: Soft, NT, ND, bowel sounds + Extremities: no edema, no cyanosis    The results of significant diagnostics from this hospitalization (including imaging, microbiology, ancillary and laboratory) are listed below for reference.     Microbiology: Recent Results (from the past 240 hour(s))  Resp Panel by RT-PCR (Flu A&B, Covid) Anterior Nasal Swab     Status: None   Collection Time: 04/21/22  9:56 PM   Specimen: Anterior Nasal Swab  Result  Value Ref Range Status   SARS Coronavirus 2 by RT PCR NEGATIVE NEGATIVE Final    Comment: (NOTE) SARS-CoV-2 target nucleic acids are NOT DETECTED.  The SARS-CoV-2 RNA is generally detectable in upper respiratory specimens during the acute phase of infection. The lowest concentration of SARS-CoV-2 viral copies this assay can detect is 138 copies/mL. A negative result does not preclude SARS-Cov-2 infection and should not be used as the sole basis for  treatment or other patient management decisions. A negative result may occur with  improper specimen collection/handling, submission of specimen other than nasopharyngeal swab, presence of viral mutation(s) within the areas targeted by this assay, and inadequate number of viral copies(<138 copies/mL). A negative result must be combined with clinical observations, patient history, and epidemiological information. The expected result is Negative.  Fact Sheet for Patients:  EntrepreneurPulse.com.au  Fact Sheet for Healthcare Providers:  IncredibleEmployment.be  This test is no t yet approved or cleared by the [place] FDA and  has been authorized for detection and/or diagnosis of SARS-CoV-2 by FDA under an Emergency Use Authorization (EUA). This EUA will remain  in effect (meaning this test can be used) for the duration of the COVID-19 declaration under Section 564(b)(1) of the Act, 21 U.S.C.section 360bbb-3(b)(1), unless the authorization is terminated  or revoked sooner.       Influenza A by PCR NEGATIVE NEGATIVE Final   Influenza B by PCR NEGATIVE NEGATIVE Final    Comment: (NOTE) The Xpert Xpress SARS-CoV-2/FLU/RSV plus assay is intended as an aid in the diagnosis of influenza from Nasopharyngeal swab specimens and should not be used as a sole basis for treatment. Nasal washings and aspirates are unacceptable for Xpert Xpress SARS-CoV-2/FLU/RSV testing.  Fact Sheet for Patients: EntrepreneurPulse.com.au  Fact Sheet for Healthcare Providers: IncredibleEmployment.be  This test is not yet approved or cleared by the [place] FDA and has been authorized for detection and/or diagnosis of SARS-CoV-2 by FDA under an Emergency Use Authorization (EUA). This EUA will remain in effect (meaning this test can be used) for the duration of the COVID-19 declaration under Section 564(b)(1) of the Act, 21  U.S.C. section 360bbb-3(b)(1), unless the authorization is terminated or revoked.  Performed at [facility], [address], [place], [place] [address]   Urine Culture     Status: None   Collection Time: 04/22/22 12:09 AM   Specimen: Urine, Random  Result Value Ref Range Status   Specimen Description   Final    URINE, RANDOM Performed at [facility], [address], [place], [place] [address]    Special Requests   Final    NONE Performed at [facility], [address], [place], [place] [address]    Culture   Final    NO GROWTH Performed at [facility], [place] [address] [place], [place] [address]    Report Status 04/23/2022 FINAL  Final  Blood culture (routine x 2)     Status: None (Preliminary result)   Collection Time: 04/22/22  1:11 AM   Specimen: BLOOD  Result Value Ref Range Status   Specimen Description BLOOD LEFT HAND  Final   Special Requests   Final    BOTTLES DRAWN AEROBIC AND ANAEROBIC Blood Culture adequate volume   Culture   Final    NO GROWTH 1 DAY Performed at [facility], [address] [place], [place] [address]    Report Status PENDING  Incomplete  Blood culture (routine x 2)  Status: None (Preliminary result)   Collection Time: 04/22/22  1:11 AM   Specimen: BLOOD  Result Value Ref Range Status   Specimen Description BLOOD RIGHT ASSIST CONTROL  Final   Special Requests   Final    BOTTLES DRAWN AEROBIC AND ANAEROBIC Blood Culture adequate volume   Culture   Final    NO GROWTH 1 DAY Performed at [facility], [address], [place], [place] [address]    Report Status PENDING  Incomplete     Labs: BNP (last 3 results) No results for input(s): "BNP" in the last 8760 hours. Basic Metabolic Panel: Recent Labs  Lab 04/21/22 1715 04/22/22 0925 04/23/22 0551  NA 138 139 138  K 4.5 4.1 4.3  CL 102 107 108  CO2 _0 GLUCOSE 356* 252* 190*  BUN 31* 23 22  CREATININE 1.58* 1.49*  1.56*  CALCIUM 9.8 8.7* 9.2   Liver Function Tests: Recent Labs  Lab 04/21/22 2156  AST 25  ALT 26  ALKPHOS 34*  BILITOT 0.1*  PROT 7.8  ALBUMIN 4.3   Recent Labs  Lab 04/21/22 2156  LIPASE 29   No results for input(s): "AMMONIA" in the last 168 hours. CBC: Recent Labs  Lab 04/21/22 1715 04/22/22 0925 04/23/22 0551  WBC 11.7* 7.8 6.6  HGB 13.0 11.5* 11.8*  HCT 40.3 35.0* 35.8*  MCV 85.0 84.5 83.6  PLT 291 234 256   Cardiac Enzymes: No results for input(s): "CKTOTAL", "CKMB", "CKMBINDEX", "TROPONINI" in the last 168 hours. BNP: Invalid input(s): "POCBNP" CBG: Recent Labs  Lab 04/22/22 0652 04/22/22 1244 04/22/22 1749 04/22/22 2126 04/23/22 0724  GLUCAP 150* 210* 243* 225* 186*   D-Dimer No results for input(s): "DDIMER" in the last 72 hours. Hgb A1c Recent Labs    04/21/22 1715  HGBA1C 8.4*   Lipid Profile No results for input(s): "CHOL", "HDL", "LDLCALC", "TRIG", "CHOLHDL", "LDLDIRECT" in the last 72 hours. Thyroid function studies No results for input(s): "TSH", "T4TOTAL", "T3FREE", "THYROIDAB" in the last 72 hours.  Invalid input(s): "FREET3" Anemia work up No results for input(s): "VITAMINB12", "FOLATE", "FERRITIN", "TIBC", "IRON", "RETICCTPCT" in the last 72 hours. Urinalysis    Component Value Date/Time   COLORURINE STRAW (A) 04/22/2022 0009   APPEARANCEUR CLEAR (A) 04/22/2022 0009   APPEARANCEUR Clear 04/30/2015 1427   LABSPEC 1.036 (H) 04/22/2022 0009   PHURINE 6.0 04/22/2022 0009   GLUCOSEU 50 (A) 04/22/2022 0009   HGBUR NEGATIVE 04/22/2022 0009   BILIRUBINUR NEGATIVE 04/22/2022 0009   BILIRUBINUR Negative 04/30/2015 1427   KETONESUR NEGATIVE 04/22/2022 0009   PROTEINUR 100 (A) 04/22/2022 0009   NITRITE NEGATIVE 04/22/2022 0009   LEUKOCYTESUR NEGATIVE 04/22/2022 0009   Sepsis Labs Recent Labs  Lab 04/21/22 1715 04/22/22 0925 04/23/22 0551  WBC 11.7* 7.8 6.6   Microbiology Recent Results (from the past 240 hour(s))  Resp  Panel by RT-PCR (Flu A&B, Covid) Anterior Nasal Swab     Status: None   Collection Time: 04/21/22  9:56 PM   Specimen: Anterior Nasal Swab  Result Value Ref Range Status   SARS Coronavirus 2 by RT PCR NEGATIVE NEGATIVE Final    Comment: (NOTE) SARS-CoV-2 target nucleic acids are NOT DETECTED.  The SARS-CoV-2 RNA is generally detectable in upper respiratory specimens during the acute phase of infection. The lowest concentration of SARS-CoV-2 viral copies this assay can detect is 138 copies/mL. A negative result does not preclude SARS-Cov-2 infection and should not be used as the sole basis for treatment  or other patient management decisions. A negative result may occur with  improper specimen collection/handling, submission of specimen other than nasopharyngeal swab, presence of viral mutation(s) within the areas targeted by this assay, and inadequate number of viral copies(<138 copies/mL). A negative result must be combined with clinical observations, patient history, and epidemiological information. The expected result is Negative.  Fact Sheet for Patients:  EntrepreneurPulse.com.au  Fact Sheet for Healthcare Providers:  IncredibleEmployment.be  This test is no t yet approved or cleared by the [place] FDA and  has been authorized for detection and/or diagnosis of SARS-CoV-2 by FDA under an Emergency Use Authorization (EUA). This EUA will remain  in effect (meaning this test can be used) for the duration of the COVID-19 declaration under Section 564(b)(1) of the Act, 21 U.S.C.section 360bbb-3(b)(1), unless the authorization is terminated  or revoked sooner.       Influenza A by PCR NEGATIVE NEGATIVE Final   Influenza B by PCR NEGATIVE NEGATIVE Final    Comment: (NOTE) The Xpert Xpress SARS-CoV-2/FLU/RSV plus assay is intended as an aid in the diagnosis of influenza from Nasopharyngeal swab specimens and should not be used as a sole  basis for treatment. Nasal washings and aspirates are unacceptable for Xpert Xpress SARS-CoV-2/FLU/RSV testing.  Fact Sheet for Patients: EntrepreneurPulse.com.au  Fact Sheet for Healthcare Providers: IncredibleEmployment.be  This test is not yet approved or cleared by the [place] FDA and has been authorized for detection and/or diagnosis of SARS-CoV-2 by FDA under an Emergency Use Authorization (EUA). This EUA will remain in effect (meaning this test can be used) for the duration of the COVID-19 declaration under Section 564(b)(1) of the Act, 21 U.S.C. section 360bbb-3(b)(1), unless the authorization is terminated or revoked.  Performed at [facility], [address] [place], [place] [address]   Urine Culture     Status: None   Collection Time: 04/22/22 12:09 AM   Specimen: Urine, Random  Result Value Ref Range Status   Specimen Description   Final    URINE, RANDOM Performed at [facility], [address], [place], [place] [address]    Special Requests   Final    NONE Performed at [facility], [address] [place], [place] [address]    Culture   Final    NO GROWTH Performed at [facility], [place] [address] [place], [place] [address]    Report Status 04/23/2022 FINAL  Final  Blood culture (routine x 2)     Status: None (Preliminary result)   Collection Time: 04/22/22  1:11 AM   Specimen: BLOOD  Result Value Ref Range Status   Specimen Description BLOOD LEFT HAND  Final   Special Requests   Final    BOTTLES DRAWN AEROBIC AND ANAEROBIC Blood Culture adequate volume   Culture   Final    NO GROWTH 1 DAY Performed at [facility], [address] [place], [place] [address]    Report Status PENDING  Incomplete  Blood culture (routine x 2)     Status: None (Preliminary result)   Collection Time: 04/22/22  1:11 AM   Specimen: BLOOD  Result Value Ref Range Status   Specimen  Description BLOOD RIGHT ASSIST CONTROL  Final   Special Requests   Final    BOTTLES DRAWN AEROBIC AND ANAEROBIC Blood Culture adequate volume   Culture   Final    NO GROWTH 1 DAY Performed at [facility], [address] [place], [place] [address]  Report Status PENDING  Incomplete     Time coordinating discharge: Over 30 minutes  SIGNED:   Mckinley Jewel, MD  [facility] 04/23/2022, 9:37 AM Pager   If 7PM-7AM, please contact night-coverage [email or web address]

## 2022-04-23 NOTE — Progress Notes (Signed)
Notified MD blood pressure is 125/98 after medication. Pt states he is going home, blood pressure will get better. MD states ok to discharge pt.

## 2022-04-23 NOTE — Plan of Care (Signed)

## 2022-04-30 LAB — CULTURE, BLOOD (ROUTINE X 2)
Culture: NO GROWTH
Culture: NO GROWTH
Special Requests: ADEQUATE
Special Requests: ADEQUATE

## 2022-05-02 ENCOUNTER — Encounter: Payer: Self-pay | Admitting: Sports Medicine

## 2022-05-02 ENCOUNTER — Ambulatory Visit (INDEPENDENT_AMBULATORY_CARE_PROVIDER_SITE_OTHER): Payer: HMO | Admitting: Sports Medicine

## 2022-05-02 ENCOUNTER — Ambulatory Visit (INDEPENDENT_AMBULATORY_CARE_PROVIDER_SITE_OTHER): Payer: HMO

## 2022-05-02 DIAGNOSIS — N12 Tubulo-interstitial nephritis, not specified as acute or chronic: Secondary | ICD-10-CM

## 2022-05-02 DIAGNOSIS — Z09 Encounter for follow-up examination after completed treatment for conditions other than malignant neoplasm: Secondary | ICD-10-CM

## 2022-05-02 DIAGNOSIS — M170 Bilateral primary osteoarthritis of knee: Secondary | ICD-10-CM

## 2022-05-02 DIAGNOSIS — M25461 Effusion, right knee: Secondary | ICD-10-CM | POA: Diagnosis not present

## 2022-05-02 DIAGNOSIS — M25462 Effusion, left knee: Secondary | ICD-10-CM | POA: Diagnosis not present

## 2022-05-02 MED ORDER — ACETAMINOPHEN ER 650 MG PO TBCR: 650.0000 mg | EXTENDED_RELEASE_TABLET | Freq: Three times a day (TID) | ORAL | 3 refills | Status: DC | PRN

## 2022-05-02 NOTE — Assessment & Plan Note (Signed)
Recurrence of knee pain, known osteoarthritis, today its more of a patellofemoral pain generator. Adding home conditioning, updated x-rays, he will use arthritis strength Tylenol 3 times daily. Return to see me in 6 weeks for this, injection if not better.

## 2022-05-02 NOTE — Progress Notes (Signed)
    Procedures performed today:    None.  Independent interpretation of notes and tests performed by another provider:   None.  Brief History, Exam, Impression, and Recommendations:    Pyelonephritis of left kidney This is a very pleasant 76 year old male, he was recently hospitalized with chills and rigors, low-grade temperature. WBC 11,000, and left upper quadrant and left flank pain. He had an extensive work-up including labs, CTA of the pulmonary arteries, abdominal and pelvic CT, daily positive finding on imaging was perinephric stranding on the left. Ultimately admitted, received IV antibiotics and was discharged with negative urine and blood cultures. Doing a lot better today. We will recheck some of his labs including urinalysis, urine culture, CBC with differential, CMP.  Primary osteoarthritis of both knees Recurrence of knee pain, known osteoarthritis, today its more of a patellofemoral pain generator. Adding home conditioning, updated x-rays, he will use arthritis strength Tylenol 3 times daily. Return to see me in 6 weeks for this, injection if not better.  I spent 30 minutes of total time managing this patient today, this includes chart review, face to face, and non-face to face time.  ____________________________________________ Gwen Her. Dianah Field, M.D., ABFM., CAQSM., AME. Primary Care and Sports Medicine [facility]  Adjunct Professor of [employer]  Risk manager

## 2022-05-02 NOTE — Assessment & Plan Note (Signed)
This is a very pleasant 76 year old male, he was recently hospitalized with chills and rigors, low-grade temperature. WBC 11,000, and left upper quadrant and left flank pain. He had an extensive work-up including labs, CTA of the pulmonary arteries, abdominal and pelvic CT, daily positive finding on imaging was perinephric stranding on the left. Ultimately admitted, received IV antibiotics and was discharged with negative urine and blood cultures. Doing a lot better today. We will recheck some of his labs including urinalysis, urine culture, CBC with differential, CMP.

## 2022-05-03 LAB — COMPLETE METABOLIC PANEL WITH GFR
AG Ratio: 1.5 (calc) (ref 1.0–2.5)
ALT: 17 U/L (ref 9–46)
AST: 14 U/L (ref 10–35)
Albumin: 4.2 g/dL (ref 3.6–5.1)
Alkaline phosphatase (APISO): 36 U/L (ref 35–144)
BUN/Creatinine Ratio: 21 (calc) (ref 6–22)
BUN: 36 mg/dL — ABNORMAL HIGH (ref 7–25)
CO2: 23 mmol/L (ref 20–32)
Calcium: 9.7 mg/dL (ref 8.6–10.3)
Chloride: 102 mmol/L (ref 98–110)
Creat: 1.74 mg/dL — ABNORMAL HIGH (ref 0.70–1.28)
Globulin: 2.8 g/dL (calc) (ref 1.9–3.7)
Glucose, Bld: 296 mg/dL — ABNORMAL HIGH (ref 65–99)
Potassium: 5 mmol/L (ref 3.5–5.3)
Sodium: 134 mmol/L — ABNORMAL LOW (ref 135–146)
Total Bilirubin: 0.3 mg/dL (ref 0.2–1.2)
Total Protein: 7 g/dL (ref 6.1–8.1)
eGFR: 40 mL/min/{1.73_m2} — ABNORMAL LOW (ref >=60)

## 2022-05-03 LAB — CBC WITH DIFFERENTIAL/PLATELET
Absolute Monocytes: 438 cells/uL (ref 200–950)
Basophils Absolute: 42 cells/uL (ref 0–200)
Basophils Relative: 0.7 %
Eosinophils Absolute: 192 cells/uL (ref 15–500)
Eosinophils Relative: 3.2 %
HCT: 36.9 % — ABNORMAL LOW (ref 38.5–50.0)
Hemoglobin: 12.3 g/dL — ABNORMAL LOW (ref 13.2–17.1)
Lymphs Abs: 1698 cells/uL (ref 850–3900)
MCH: 28.5 pg (ref 27.0–33.0)
MCHC: 33.3 g/dL (ref 32.0–36.0)
MCV: 85.6 fL (ref 80.0–100.0)
MPV: 10.4 fL (ref 7.5–12.5)
Monocytes Relative: 7.3 %
Neutro Abs: 3630 cells/uL (ref 1500–7800)
Neutrophils Relative %: 60.5 %
Platelets: 353 10*3/uL (ref 140–400)
RBC: 4.31 10*6/uL (ref 4.20–5.80)
RDW: 13.6 % (ref 11.0–15.0)
Total Lymphocyte: 28.3 %
WBC: 6 10*3/uL (ref 3.8–10.8)

## 2022-05-03 LAB — URINALYSIS W MICROSCOPIC + REFLEX CULTURE
Bacteria, UA: NONE SEEN /HPF
Bilirubin Urine: NEGATIVE
Hgb urine dipstick: NEGATIVE
Hyaline Cast: NONE SEEN /LPF
Ketones, ur: NEGATIVE
Leukocyte Esterase: NEGATIVE
Nitrites, Initial: NEGATIVE
RBC / HPF: NONE SEEN /HPF (ref 0–2)
Specific Gravity, Urine: 1.016 (ref 1.001–1.035)
Squamous Epithelial / HPF: NONE SEEN /HPF (ref <=5)
WBC, UA: NONE SEEN /HPF (ref 0–5)
pH: 5.5 (ref 5.0–8.0)

## 2022-05-03 LAB — NO CULTURE INDICATED

## 2022-05-04 ENCOUNTER — Other Ambulatory Visit: Payer: Self-pay | Admitting: Sports Medicine

## 2022-05-05 ENCOUNTER — Other Ambulatory Visit: Payer: Self-pay

## 2022-05-05 MED ORDER — ONETOUCH DELICA PLUS LANCET33G MISC: 11 refills | Status: DC

## 2022-05-10 DIAGNOSIS — H18413 Arcus senilis, bilateral: Secondary | ICD-10-CM | POA: Diagnosis not present

## 2022-05-10 DIAGNOSIS — H2513 Age-related nuclear cataract, bilateral: Secondary | ICD-10-CM | POA: Diagnosis not present

## 2022-05-10 DIAGNOSIS — H2512 Age-related nuclear cataract, left eye: Secondary | ICD-10-CM | POA: Diagnosis not present

## 2022-05-10 DIAGNOSIS — H25043 Posterior subcapsular polar age-related cataract, bilateral: Secondary | ICD-10-CM | POA: Diagnosis not present

## 2022-05-10 DIAGNOSIS — H25013 Cortical age-related cataract, bilateral: Secondary | ICD-10-CM | POA: Diagnosis not present

## 2022-05-13 ENCOUNTER — Other Ambulatory Visit (HOSPITAL_COMMUNITY): Payer: Self-pay | Admitting: Interventional Radiology

## 2022-05-13 DIAGNOSIS — I739 Peripheral vascular disease, unspecified: Secondary | ICD-10-CM

## 2022-05-16 DIAGNOSIS — I10 Essential (primary) hypertension: Secondary | ICD-10-CM | POA: Diagnosis not present

## 2022-05-16 DIAGNOSIS — G4733 Obstructive sleep apnea (adult) (pediatric): Secondary | ICD-10-CM | POA: Diagnosis not present

## 2022-05-17 ENCOUNTER — Telehealth (HOSPITAL_COMMUNITY): Payer: Self-pay | Admitting: Radiology

## 2022-05-17 NOTE — Telephone Encounter (Signed)
Called pt to schedule LLE angio w/ possible intervention with Dr. Earleen Newport. Per pt he does NOT want to schedule until after Jan. 28, 2024. Will notify Dr. Earleen Newport of this info. JM

## 2022-06-13 ENCOUNTER — Other Ambulatory Visit: Payer: Self-pay | Admitting: Interventional Radiology

## 2022-06-13 DIAGNOSIS — I739 Peripheral vascular disease, unspecified: Secondary | ICD-10-CM

## 2022-06-24 ENCOUNTER — Other Ambulatory Visit: Payer: Self-pay

## 2022-06-24 MED ORDER — METFORMIN HCL 1000 MG PO TABS: 1000.0000 mg | ORAL_TABLET | Freq: Two times a day (BID) | ORAL | 1 refills | Status: DC

## 2022-07-04 ENCOUNTER — Encounter: Payer: Self-pay | Admitting: Interventional Radiology

## 2022-08-17 ENCOUNTER — Other Ambulatory Visit: Payer: Self-pay | Admitting: Sports Medicine

## 2022-08-17 DIAGNOSIS — E1169 Type 2 diabetes mellitus with other specified complication: Secondary | ICD-10-CM

## 2022-08-17 DIAGNOSIS — E118 Type 2 diabetes mellitus with unspecified complications: Secondary | ICD-10-CM

## 2022-08-31 DIAGNOSIS — H2512 Age-related nuclear cataract, left eye: Secondary | ICD-10-CM | POA: Diagnosis not present

## 2022-08-31 DIAGNOSIS — H2513 Age-related nuclear cataract, bilateral: Secondary | ICD-10-CM | POA: Diagnosis not present

## 2022-09-06 ENCOUNTER — Ambulatory Visit (INDEPENDENT_AMBULATORY_CARE_PROVIDER_SITE_OTHER): Payer: PPO | Admitting: Sports Medicine

## 2022-09-06 ENCOUNTER — Encounter: Payer: Self-pay | Admitting: Sports Medicine

## 2022-09-06 VITALS — BP 117/59 | HR 58 | Ht 61.0 in | Wt 138.5 lb

## 2022-09-06 DIAGNOSIS — I10 Essential (primary) hypertension: Secondary | ICD-10-CM

## 2022-09-06 DIAGNOSIS — E1121 Type 2 diabetes mellitus with diabetic nephropathy: Secondary | ICD-10-CM

## 2022-09-06 DIAGNOSIS — E1165 Type 2 diabetes mellitus with hyperglycemia: Secondary | ICD-10-CM | POA: Diagnosis not present

## 2022-09-06 NOTE — Progress Notes (Signed)
    Procedures performed today:    None.  Independent interpretation of notes and tests performed by another provider:   None.  Brief History, Exam, Impression, and Recommendations:    Uncontrolled type 2 diabetes mellitus with hyperglycemia, without long-term current use of insulin (HCC) Michael Thomas returns for his diabetes, we will check his numbers today. He does Januvia when out of the donut hole and Actos when he is in the donut hole. At this point he will be stopping the Actos and continue with Januvia, we will check his labs today and then again in 3 months.  Essential hypertension, benign Michael Thomas does have a whitecoat component to his hypertension. It was 117/59 at home yesterday, typically always runs below 140/90 at home. We will monitor. If consistently runs above 140/90 he will need amlodipine 5.  I spent 30 minutes of total time managing this patient today, this includes chart review, face to face, and non-face to face time.  ____________________________________________ Gwen Her. Dianah Field, M.D., ABFM., CAQSM., AME. Primary Care and Sports Medicine [facility]  Adjunct Professor of [employer]  Risk manager

## 2022-09-06 NOTE — Assessment & Plan Note (Addendum)
Michael Thomas does have a whitecoat component to his hypertension. It was 117/59 at home yesterday, typically always runs below 140/90 at home. We will monitor. If consistently runs above 140/90 he will need amlodipine 5.

## 2022-09-06 NOTE — Assessment & Plan Note (Addendum)
>>  ASSESSMENT AND PLAN FOR TYPE 2 DIABETES MELLITUS WITH DIABETIC CHRONIC KIDNEY DISEASE (HCC) WRITTEN ON 09/06/2022  9:18 AM BY Emylia Latella, DEBBY PARAS, MD  Michael Thomas returns for his diabetes, we will check his numbers today. He does Januvia  when out of the donut hole and Actos  when he is in the donut hole. At this point he will be stopping the Actos  and continue with Januvia , we will check his labs today and then again in 3 months.   >>ASSESSMENT AND PLAN FOR DIABETIC NEPHROPATHY ASSOCIATED WITH TYPE 2 DIABETES MELLITUS (HCC) WRITTEN ON 09/07/2022 10:09 AM BY Kandiss Ihrig, DEBBY PARAS, MD  Currently on Januvia , glipizide , metformin . Urine microalbumin/creatinine ratio was elevated on recent check. We will work on better control of his diabetes, I would like him to touch base with nephrology. I do suspect he may need an SGLT2 inhibitor.

## 2022-09-07 DIAGNOSIS — E1121 Type 2 diabetes mellitus with diabetic nephropathy: Secondary | ICD-10-CM | POA: Insufficient documentation

## 2022-09-07 LAB — COMPLETE METABOLIC PANEL WITH GFR
AG Ratio: 1.5 (calc) (ref 1.0–2.5)
ALT: 14 U/L (ref 9–46)
AST: 14 U/L (ref 10–35)
Albumin: 4.4 g/dL (ref 3.6–5.1)
Alkaline phosphatase (APISO): 27 U/L — ABNORMAL LOW (ref 35–144)
BUN/Creatinine Ratio: 22 (calc) (ref 6–22)
BUN: 36 mg/dL — ABNORMAL HIGH (ref 7–25)
CO2: 25 mmol/L (ref 20–32)
Calcium: 9.7 mg/dL (ref 8.6–10.3)
Chloride: 107 mmol/L (ref 98–110)
Creat: 1.62 mg/dL — ABNORMAL HIGH (ref 0.70–1.28)
Globulin: 2.9 g/dL (calc) (ref 1.9–3.7)
Glucose, Bld: 124 mg/dL — ABNORMAL HIGH (ref 65–99)
Potassium: 5 mmol/L (ref 3.5–5.3)
Sodium: 140 mmol/L (ref 135–146)
Total Bilirubin: 0.3 mg/dL (ref 0.2–1.2)
Total Protein: 7.3 g/dL (ref 6.1–8.1)
eGFR: 43 mL/min/{1.73_m2} — ABNORMAL LOW (ref >=60)

## 2022-09-07 LAB — CBC
HCT: 36.6 % — ABNORMAL LOW (ref 38.5–50.0)
Hemoglobin: 12 g/dL — ABNORMAL LOW (ref 13.2–17.1)
MCH: 27.8 pg (ref 27.0–33.0)
MCHC: 32.8 g/dL (ref 32.0–36.0)
MCV: 84.9 fL (ref 80.0–100.0)
MPV: 9.4 fL (ref 7.5–12.5)
Platelets: 354 10*3/uL (ref 140–400)
RBC: 4.31 10*6/uL (ref 4.20–5.80)
RDW: 13.7 % (ref 11.0–15.0)
WBC: 5.6 10*3/uL (ref 3.8–10.8)

## 2022-09-07 LAB — LIPID PANEL
Cholesterol: 144 mg/dL (ref <200)
HDL: 38 mg/dL — ABNORMAL LOW (ref >=40)
LDL Cholesterol (Calc): 81 mg/dL (calc)
Non-HDL Cholesterol (Calc): 106 mg/dL (calc) (ref <130)
Total CHOL/HDL Ratio: 3.8 (calc) (ref <5.0)
Triglycerides: 149 mg/dL (ref <150)

## 2022-09-07 LAB — HEMOGLOBIN A1C
Hgb A1c MFr Bld: 8.1 % of total Hgb — ABNORMAL HIGH (ref <5.7)
Mean Plasma Glucose: 186 mg/dL
eAG (mmol/L): 10.3 mmol/L

## 2022-09-07 LAB — TSH: TSH: 0.98 mIU/L (ref 0.40–4.50)

## 2022-09-07 LAB — MICROALBUMIN / CREATININE URINE RATIO
Creatinine, Urine: 80 mg/dL (ref 20–320)
Microalb Creat Ratio: 434 mcg/mg creat — ABNORMAL HIGH (ref <30)
Microalb, Ur: 34.7 mg/dL

## 2022-09-07 NOTE — Assessment & Plan Note (Signed)
Currently on Januvia, glipizide, metformin. Urine microalbumin/creatinine ratio was elevated on recent check. We will work on better control of his diabetes, I would like him to touch base with nephrology. I do suspect he may need an SGLT2 inhibitor.

## 2022-09-07 NOTE — Addendum Note (Signed)
Addended by: Silverio Decamp on: 09/07/2022 10:09 AM   Modules accepted: Orders

## 2022-09-21 ENCOUNTER — Other Ambulatory Visit: Payer: Self-pay | Admitting: Interventional Radiology

## 2022-09-21 ENCOUNTER — Other Ambulatory Visit: Payer: Self-pay | Admitting: Sports Medicine

## 2022-09-21 DIAGNOSIS — E785 Hyperlipidemia, unspecified: Secondary | ICD-10-CM

## 2022-09-21 DIAGNOSIS — I739 Peripheral vascular disease, unspecified: Secondary | ICD-10-CM

## 2022-09-23 ENCOUNTER — Ambulatory Visit
Admission: RE | Admit: 2022-09-23 | Discharge: 2022-09-23 | Disposition: A | Discharge status: Home / Self Care | Payer: PPO | Source: Ambulatory Visit | Attending: Interventional Radiology | Admitting: Interventional Radiology

## 2022-09-23 ENCOUNTER — Other Ambulatory Visit: Payer: Self-pay | Admitting: Interventional Radiology

## 2022-09-23 ENCOUNTER — Other Ambulatory Visit: Payer: Self-pay | Admitting: Student

## 2022-09-23 DIAGNOSIS — Z9889 Other specified postprocedural states: Secondary | ICD-10-CM | POA: Diagnosis not present

## 2022-09-23 DIAGNOSIS — I70213 Atherosclerosis of native arteries of extremities with intermittent claudication, bilateral legs: Secondary | ICD-10-CM | POA: Diagnosis not present

## 2022-09-23 DIAGNOSIS — I70219 Atherosclerosis of native arteries of extremities with intermittent claudication, unspecified extremity: Secondary | ICD-10-CM

## 2022-09-23 DIAGNOSIS — I739 Peripheral vascular disease, unspecified: Secondary | ICD-10-CM

## 2022-09-23 HISTORY — PX: IR RADIOLOGIST EVAL & MGMT: IMG5224

## 2022-09-23 MED ORDER — CLOPIDOGREL BISULFATE 75 MG PO TABS: 75.0000 mg | ORAL_TABLET | Freq: Every day | ORAL | 2 refills | Status: DC

## 2022-09-23 NOTE — Progress Notes (Addendum)
Chief Complaint: Claudication   Referring Physician(s): Dr. Dianah Field   History of Present Illness: Michael Thomas is a 77 y.o. male presenting as a scheduled follow up to [place] today, with bilateral symptomatic PAD.    Michael Thomas joins [place] today in the clinic by himself.    History: We first met Michael Thomas 02/01/2018, for complaints of left sided claudication.  He had an opinion closer to his home in [place], but sought second opinion with ongoing symptoms after MRA demonstrated femoral-popliteal etiology.    Treatment history has been: 02/20/18: Left SFA directional atherectomy and DEB 05/14/2019: Right SFA directional atherectomy and DEB 05/07/20: Left SFA laser atherectomy and DEB for recurrent stenosis 02/15/21: Right SFA directional atherectomy and DEB for recurrent stenosis 12/14/2021: Right SFA PTA/Stenting of recurrent, symptomatic, multi-focal SFA stenosis, with supera.    03/23/22 ABI/Non-invasive: Rigtht ABI: 1.05 Left ABI: 0.99. post exercise: 0.35.     Interval:  Michael Thomas tells me today that he recently had his second of bilateral cataract surgery.  He has recovered completely and is taking his medications normally again.    He continues to exercise daily on the treadmill.  He is unable to maintain any speed greater than about 12mles/hr, as this recreates his left lower extremity symptoms of cramping pain in the calf.  He remains asymptomatic on the right after treatment.    He desires to have his left symptoms treated so that he can comfortably exercise.   He has no wounds.    He was admitted 1 night 04/21/22 for possible UTI, and was discharged 9/15.      Past Medical History:  Diagnosis Date   Benign prostatic hyperplasia with urinary obstruction 03/25/2015   Diabetes mellitus without complication (HCC)    Hyperlipidemia    Kidney stone    OSA (obstructive sleep apnea)    Vitamin D deficiency     Past Surgical History:  Procedure Laterality  Date   DG ARTHRO THUMB*L*     IR ANGIOGRAM EXTREMITY BILATERAL  02/20/2018   IR ANGIOGRAM EXTREMITY BILATERAL  04/05/2019   IR ANGIOGRAM EXTREMITY LEFT  05/07/2020   IR ANGIOGRAM EXTREMITY RIGHT  02/15/2021   IR ANGIOGRAM EXTREMITY RIGHT  12/14/2021   IR ANGIOGRAM SELECTIVE EACH ADDITIONAL VESSEL  02/20/2018   IR FEM POP ART ATHERECT INC PTA MOD SED  02/20/2018   IR FEM POP ART ATHERECT INC PTA MOD SED  05/14/2019   IR FEM POP ART ATHERECT INC PTA MOD SED  05/07/2020   IR FEM POP ART ATHERECT INC PTA MOD SED  02/15/2021   IR FEM POP ART STENT INC PTA MOD SED  12/14/2021   IR INTRAVASCULAR ULTRASOUND NON CORONARY  02/15/2021   IR INTRAVASCULAR ULTRASOUND NON CORONARY  12/14/2021   IR RADIOLOGIST EVAL & MGMT  02/01/2018   IR RADIOLOGIST EVAL & MGMT  03/20/2018   IR RADIOLOGIST EVAL & MGMT  07/25/2018   IR RADIOLOGIST EVAL & MGMT  03/07/2019   IR RADIOLOGIST EVAL & MGMT  04/23/2019   IR RADIOLOGIST EVAL & MGMT  06/18/2019   IR RADIOLOGIST EVAL & MGMT  04/29/2020   IR RADIOLOGIST EVAL & MGMT  06/04/2020   IR RADIOLOGIST EVAL & MGMT  01/20/2021   IR RADIOLOGIST EVAL & MGMT  04/27/2021   IR RADIOLOGIST EVAL & MGMT  08/12/2021   IR RADIOLOGIST EVAL & MGMT  11/02/2021   IR RADIOLOGIST EVAL & MGMT  11/15/2021   IR RADIOLOGIST EVAL & MGMT  01/26/2022   IR RADIOLOGIST EVAL & MGMT  03/23/2022   IR US GUIDE VASC ACCESS LEFT  04/05/2019   IR US GUIDE VASC ACCESS LEFT  02/15/2021   IR US GUIDE VASC ACCESS LEFT  12/14/2021   IR US GUIDE VASC ACCESS RIGHT  02/20/2018   IR US GUIDE VASC ACCESS RIGHT  05/14/2019   IR US GUIDE VASC ACCESS RIGHT  05/07/2020   NECK SURGERY     VASECTOMY      Allergies: Amoxicillin  Medications: Prior to Admission medications   Medication Sig Start Date End Date Taking? Authorizing Provider  acetaminophen (TYLENOL) 650 MG CR tablet Take 1 tablet (650 mg total) by mouth every 8 (eight) hours as needed for pain. 05/02/22   Silverio Decamp, MD  AMBULATORY NON FORMULARY MEDICATION One Touch  Ultra 2 glucometer 05/15/20   Silverio Decamp, MD  aspirin EC 81 MG tablet Take 81 mg by mouth at bedtime.    [provider]  Blood Glucose Monitoring Suppl (ONETOUCH VERIO REFLECT) w/Device KIT Check fasting blood sugar every morning and 2 hours after largest meal of the day., 11/22/21   Silverio Decamp, MD  clobetasol ointment (TEMOVATE) [id number] % Apply 1 Application topically 2 (two) times daily as needed (blisters). 03/15/22   Silverio Decamp, MD  fenofibrate 160 MG tablet Take 1 tablet by mouth once daily 09/21/22   Silverio Decamp, MD  fluticasone [facility]) 50 MCG/ACT nasal spray Place 1 spray into both nostrils daily as needed for allergies or rhinitis.    [provider]  glipiZIDE (GLUCOTROL XL) 10 MG 24 hr tablet TAKE 1 TABLET BY MOUTH EVERY DAY WITH BREAKFAST 02/16/22   Silverio Decamp, MD  glucose blood (ONETOUCH VERIO) test strip Check fasting blood sugar every morning and 2 hours after largest meal of the day., 11/22/21   Silverio Decamp, MD  Lancets (ONETOUCH DELICA PLUS [id number]) MISC USE UP TO 3 TIMES DAILY. 05/05/22   Silverio Decamp, MD  lisinopril-hydrochlorothiazide (ZESTORETIC) 20-25 MG tablet Take 1 tablet by mouth daily. 02/16/22   Silverio Decamp, MD  magnesium oxide (MAG-OX) 400 MG tablet Take 2 tablets (800 mg total) by mouth at bedtime. Patient taking differently: Take 400 mg by mouth as needed (cramps). 08/18/21   Silverio Decamp, MD  metFORMIN (GLUCOPHAGE) 1000 MG tablet Take 1 tablet (1,000 mg total) by mouth 2 (two) times daily with a meal. 06/24/22   Silverio Decamp, MD  omega-3 acid ethyl esters (LOVAZA) 1 g capsule Take 2 capsules (2 g total) by mouth 2 (two) times daily. 02/16/22   Silverio Decamp, MD  simvastatin (ZOCOR) 40 MG tablet Take 1 tablet (40 mg total) by mouth daily at 6 PM. Patient taking differently: Take 40 mg by mouth at bedtime. 03/29/22   Silverio Decamp, MD   sitaGLIPtin (JANUVIA) 100 MG tablet Take 1 tablet (100 mg total) by mouth at bedtime. 08/17/22   Silverio Decamp, MD  tamsulosin (FLOMAX) 0.4 MG CAPS capsule Take 1 capsule (0.4 mg total) by mouth 2 (two) times daily. 04/12/22   Silverio Decamp, MD     Family History  Problem Relation Age of Onset   Stroke Mother    Diabetes Mellitus II Mother    Prostate cancer Neg Hx    Kidney cancer Neg Hx    Bladder Cancer Neg Hx     Social History   Socioeconomic History   Marital status: Married  Spouse name: Not on file   Number of children: Not on file   Years of education: Not on file   Highest education level: Not on file  Occupational History   Not on file  Tobacco Use   Smoking status: Never   Smokeless tobacco: Never  Vaping Use   Vaping Use: Never used  Substance and Sexual Activity   Alcohol use: No   Drug use: No   Sexual activity: Yes    Birth control/protection: None  Other Topics Concern   Not on file  Social History Narrative   Not on file   Social Determinants of Health   Financial Resource Strain: Not on file  Food Insecurity: No Food Insecurity (04/23/2022)   Hunger Vital Sign    Worried About Running Out of Food in the Last Year: Never true    Ran Out of Food in the Last Year: Never true  Transportation Needs: No Transportation Needs (04/23/2022)   PRAPARE - Hydrologist (Medical): No    Lack of Transportation (Non-Medical): No  Physical Activity: Not on file  Stress: Not on file  Social Connections: Not on file       Review of Systems: A 12 point ROS discussed and pertinent positives are indicated in the HPI above.  All other systems are negative.  Review of Systems  Vital Signs: BP 129/60 (BP Location: Left Arm, Patient Position: Sitting, Cuff Size: Normal)   Pulse 64   Temp 97.7 F (36.5 C) (Oral)   Wt 59.9 kg   SpO2 96% Comment: room air  BMI 24.94 kg/m   Advance Care Plan: The advanced care  plan/surrogate decision maker was discussed at the time of visit and documented in the medical record.    Physical Exam General: 77 yo male appearing stated age.  Well-developed, well-nourished.  No distress. HEENT: Atraumatic, normocephalic.  Today he is not wearing glasses. Conjugate gaze, extra-ocular motor intact. No scleral icterus or scleral injection. No lesions on external ears, nose, lips, or gums.  Oral mucosa moist, pink.  Neck: Symmetric with no goiter enlargement.  Chest/Lungs:  Symmetric chest with inspiration/expiration.  No labored breathing.   Heart:   No JVD appreciated.  Abdomen:  Soft, NT/ND, with + bowel sounds.   Genito-urinary: Deferred Pulse Exam:  No bruit appreciated.   Palpable right PT and DP pulses.  Doppler positive left PT and DP pulses, monophasic.  Extremities: No wound.  Warm and well perfused.       Imaging: No results found.  Labs:  CBC: Recent Labs    04/22/22 0925 04/23/22 0551 05/02/22 0000 09/06/22 0919  WBC 7.8 6.6 6.0 5.6  HGB 11.5* 11.8* 12.3* 12.0*  HCT 35.0* 35.8* 36.9* 36.6*  PLT 234 256 353 354    COAGS: Recent Labs    12/14/21 0714  INR 1.0    BMP: Recent Labs    12/14/21 0714 04/21/22 1715 04/22/22 0925 04/23/22 0551 05/02/22 0000 09/06/22 0919  NA 136 138 139 138 134* 140  K 4.7 4.5 4.1 4.3 5.0 5.0  CL 105 102 107 108 102 107  CO2 21* 25 25 22 23 25  $ GLUCOSE 244* 356* 252* 190* 296* 124*  BUN 42* 31* 23 22 36* 36*  CALCIUM 9.2 9.8 8.7* 9.2 9.7 9.7  CREATININE 2.14* 1.58* 1.49* 1.56* 1.74* 1.62*  GFRNONAA 31* 44* 48* 45*  --   --     LIVER FUNCTION TESTS: Recent Labs  04/21/22 2156 05/02/22 0000 09/06/22 0919  BILITOT 0.1* 0.3 0.3  AST 25 14 14  $ ALT 26 17 14  $ ALKPHOS 34*  --   --   PROT 7.8 7.0 7.3  ALBUMIN 4.3  --   --     TUMOR MARKERS: No results for input(s): "AFPTM", "CEA", "CA199", "CHROMGRNA" in the last 8760 hours.  Assessment and Plan:  Assessment:  Michael Thomas is a 77yo male  presenting with life-style limiting, short-distance claudication of the left lower extremity, compatible with Rutherford 3 class symptoms. This has been worsening over time.     Non-invasive lower extremity exam and imaging work-up shows evidence of recurrent femoral-popliteal disease.   He continues to exercise daily, and is unable to accelerate on the treadmill beyond about 37mh comfortably.  He would like to be more comfortable, and strongly would like treatment.   Patient has elected to proceed with endovascular options.   Regarding endovascular options, specific risks discussed include: bleeding, infection, contrast reaction, renal injury/nephropathy, arterial injury/dissection, need for additional procedure/surgery, worsening symptoms/tissue including limb loss, cardiopulmonary collapse, death.    Regarding medical management, maximal medical therapy for reduction of risk factors is indicated as recommended by updated AHA guidelines1.  This includes anti-platelet medication, tight blood glucose control to a HbA1c < 7, tight blood pressure control, maximum-dose HMG-CoA reductase inhibitor, and smoking cessation.       Plan: - Plan to proceed with aorto-peripheral angiogram and possible intervention, [facility] Dr[facility]  - We will need to update his resting ABI study before treatment, for new baseline.  - We will send him new 90 day prescription for plavix 778mdaily.  He can start this in addition to his daily aspirin 1 week before his appointment for angiogram.  - Continue maximal medical therapy   ___________________________________________________________________   1- Morley KosD, et al. 2016 AHA/ACC Guideline on the Management of Patients With Lower Extremity Peripheral Artery Disease: Executive Summary: A Report of the American College of Cardiology/American Heart Association Task Force on Clinical Practice Guidelines. J Am Coll Cardiol.  2017 Mar 21;69(11):1465-1508. doi: 10.1016/j.jacc.2016.11.008.   2 - Norgren L, et al. TASC II Working Group. Inter-society consensus for the management of peripheral arterial disease. Int AnTressia Miners2007 Jun;26(2):81-157. Review. PubMed PMID: [id number] - Hingorani A, et al. The management of diabetic foot: A clinical practice guideline by the Society for Vascular Surgery in collaboration with the [place] the Society  for Vascular Medicine. J Vasc Surg. 2016 Feb;63(2 Suppl):3S-21S. doi: 10.1016/j.jvs.2015.10.003. PubMed PMID: [id number] 4 - MuCorinna GabSaab FA, MaLuberta MutterPeGrant RutsZeEwell PoeDriver VR, NeWalnutLookstein R, van den BeBaldemar LenisJaff Michael, MiGuadalupe DawnHenao S, AlMahameed A, Katzen B. Digital Subtraction Angiography Prior to an Amputation for Critical Limb Ischemia (CLI): An Expert Recommendation Statement From the CLI Global Society to Optimize Limb Salvage. J Endovasc Ther. 2020 Aug;27(4):540-546. doi: 10.1177/1526602820928590. Epub 2020 May 29. PMID: 32TX:2547907   Electronically Signed: JaCorrie Mckusick/16/2024, 11:06 AM   I spent a total of    40 Minutes in face to face in clinical consultation, greater than 50% of which was counseling/coordinating care for bilateral PAD, with recurrent life-style limiting claudication of the LLE, Rutherford 3.

## 2022-10-04 DIAGNOSIS — E785 Hyperlipidemia, unspecified: Secondary | ICD-10-CM | POA: Diagnosis not present

## 2022-10-04 DIAGNOSIS — N16 Renal tubulo-interstitial disorders in diseases classified elsewhere: Secondary | ICD-10-CM | POA: Diagnosis not present

## 2022-10-04 DIAGNOSIS — N1832 Chronic kidney disease, stage 3b: Secondary | ICD-10-CM | POA: Diagnosis not present

## 2022-10-04 DIAGNOSIS — D631 Anemia in chronic kidney disease: Secondary | ICD-10-CM | POA: Diagnosis not present

## 2022-10-04 DIAGNOSIS — R809 Proteinuria, unspecified: Secondary | ICD-10-CM | POA: Diagnosis not present

## 2022-10-04 DIAGNOSIS — K760 Fatty (change of) liver, not elsewhere classified: Secondary | ICD-10-CM | POA: Diagnosis not present

## 2022-10-04 DIAGNOSIS — E1122 Type 2 diabetes mellitus with diabetic chronic kidney disease: Secondary | ICD-10-CM | POA: Diagnosis not present

## 2022-10-04 DIAGNOSIS — N400 Enlarged prostate without lower urinary tract symptoms: Secondary | ICD-10-CM | POA: Diagnosis not present

## 2022-10-04 DIAGNOSIS — I10 Essential (primary) hypertension: Secondary | ICD-10-CM | POA: Diagnosis not present

## 2022-10-04 DIAGNOSIS — R8290 Unspecified abnormal findings in urine: Secondary | ICD-10-CM | POA: Diagnosis not present

## 2022-10-04 DIAGNOSIS — N281 Cyst of kidney, acquired: Secondary | ICD-10-CM | POA: Diagnosis not present

## 2022-10-05 ENCOUNTER — Ambulatory Visit
Admission: RE | Admit: 2022-10-05 | Discharge: 2022-10-05 | Disposition: A | Discharge status: Home / Self Care | Payer: PPO | Source: Ambulatory Visit | Attending: Interventional Radiology | Admitting: Interventional Radiology

## 2022-10-05 DIAGNOSIS — I998 Other disorder of circulatory system: Secondary | ICD-10-CM | POA: Diagnosis not present

## 2022-10-05 DIAGNOSIS — Z01818 Encounter for other preprocedural examination: Secondary | ICD-10-CM | POA: Diagnosis not present

## 2022-10-05 DIAGNOSIS — I70219 Atherosclerosis of native arteries of extremities with intermittent claudication, unspecified extremity: Secondary | ICD-10-CM

## 2022-10-06 ENCOUNTER — Other Ambulatory Visit: Payer: Self-pay | Admitting: Nephrology

## 2022-10-06 DIAGNOSIS — R809 Proteinuria, unspecified: Secondary | ICD-10-CM

## 2022-10-06 DIAGNOSIS — R8290 Unspecified abnormal findings in urine: Secondary | ICD-10-CM

## 2022-10-06 DIAGNOSIS — N1832 Chronic kidney disease, stage 3b: Secondary | ICD-10-CM

## 2022-10-12 ENCOUNTER — Other Ambulatory Visit: Payer: Self-pay | Admitting: Internal Medicine

## 2022-10-12 ENCOUNTER — Ambulatory Visit
Admission: RE | Admit: 2022-10-12 | Discharge: 2022-10-12 | Disposition: A | Discharge status: Home / Self Care | Payer: PPO | Source: Ambulatory Visit | Attending: Nephrology | Admitting: Nephrology

## 2022-10-12 DIAGNOSIS — N1832 Chronic kidney disease, stage 3b: Secondary | ICD-10-CM | POA: Insufficient documentation

## 2022-10-12 DIAGNOSIS — D631 Anemia in chronic kidney disease: Secondary | ICD-10-CM | POA: Diagnosis not present

## 2022-10-12 DIAGNOSIS — E1122 Type 2 diabetes mellitus with diabetic chronic kidney disease: Secondary | ICD-10-CM | POA: Diagnosis not present

## 2022-10-12 DIAGNOSIS — R8290 Unspecified abnormal findings in urine: Secondary | ICD-10-CM | POA: Insufficient documentation

## 2022-10-12 DIAGNOSIS — I739 Peripheral vascular disease, unspecified: Secondary | ICD-10-CM

## 2022-10-12 DIAGNOSIS — R809 Proteinuria, unspecified: Secondary | ICD-10-CM | POA: Insufficient documentation

## 2022-10-13 ENCOUNTER — Other Ambulatory Visit (HOSPITAL_COMMUNITY): Payer: Self-pay | Admitting: Interventional Radiology

## 2022-10-13 ENCOUNTER — Ambulatory Visit (HOSPITAL_COMMUNITY)
Admission: RE | Admit: 2022-10-13 | Discharge: 2022-10-13 | Disposition: A | Discharge status: Home / Self Care | Payer: PPO | Source: Ambulatory Visit | Attending: Interventional Radiology | Admitting: Interventional Radiology

## 2022-10-13 ENCOUNTER — Other Ambulatory Visit: Payer: Self-pay

## 2022-10-13 ENCOUNTER — Encounter (HOSPITAL_COMMUNITY): Payer: Self-pay

## 2022-10-13 DIAGNOSIS — E1151 Type 2 diabetes mellitus with diabetic peripheral angiopathy without gangrene: Secondary | ICD-10-CM | POA: Diagnosis not present

## 2022-10-13 DIAGNOSIS — G4733 Obstructive sleep apnea (adult) (pediatric): Secondary | ICD-10-CM | POA: Diagnosis not present

## 2022-10-13 DIAGNOSIS — I739 Peripheral vascular disease, unspecified: Secondary | ICD-10-CM

## 2022-10-13 DIAGNOSIS — N400 Enlarged prostate without lower urinary tract symptoms: Secondary | ICD-10-CM | POA: Insufficient documentation

## 2022-10-13 DIAGNOSIS — Z7982 Long term (current) use of aspirin: Secondary | ICD-10-CM | POA: Insufficient documentation

## 2022-10-13 DIAGNOSIS — I70212 Atherosclerosis of native arteries of extremities with intermittent claudication, left leg: Secondary | ICD-10-CM | POA: Insufficient documentation

## 2022-10-13 DIAGNOSIS — Z7902 Long term (current) use of antithrombotics/antiplatelets: Secondary | ICD-10-CM | POA: Diagnosis not present

## 2022-10-13 DIAGNOSIS — Z7984 Long term (current) use of oral hypoglycemic drugs: Secondary | ICD-10-CM | POA: Diagnosis not present

## 2022-10-13 DIAGNOSIS — I70222 Atherosclerosis of native arteries of extremities with rest pain, left leg: Secondary | ICD-10-CM | POA: Diagnosis not present

## 2022-10-13 HISTORY — PX: IR ANGIOGRAM SELECTIVE EACH ADDITIONAL VESSEL: IMG667

## 2022-10-13 HISTORY — PX: IR US GUIDE VASC ACCESS RIGHT: IMG2390

## 2022-10-13 HISTORY — PX: IR ILIAC ART STENT INC PTA MOD SED: IMG2306

## 2022-10-13 HISTORY — PX: IR INTRAVASCULAR ULTRASOUND NON CORONARY: IMG6085

## 2022-10-13 HISTORY — PX: IR ANGIOGRAM EXTREMITY LEFT: IMG651

## 2022-10-13 LAB — PROTIME-INR
INR: 1 (ref 0.8–1.2)
Prothrombin Time: 13.4 seconds (ref 11.4–15.2)

## 2022-10-13 LAB — CBC WITH DIFFERENTIAL/PLATELET
Abs Immature Granulocytes: 0.02 10*3/uL (ref 0.00–0.07)
Basophils Absolute: 0 10*3/uL (ref 0.0–0.1)
Basophils Relative: 1 %
Eosinophils Absolute: 0.2 10*3/uL (ref 0.0–0.5)
Eosinophils Relative: 4 %
HCT: 34.2 % — ABNORMAL LOW (ref 39.0–52.0)
Hemoglobin: 11.5 g/dL — ABNORMAL LOW (ref 13.0–17.0)
Immature Granulocytes: 0 %
Lymphocytes Relative: 22 %
Lymphs Abs: 1.1 10*3/uL (ref 0.7–4.0)
MCH: 28.9 pg (ref 26.0–34.0)
MCHC: 33.6 g/dL (ref 30.0–36.0)
MCV: 85.9 fL (ref 80.0–100.0)
Monocytes Absolute: 0.5 10*3/uL (ref 0.1–1.0)
Monocytes Relative: 9 %
Neutro Abs: 3.3 10*3/uL (ref 1.7–7.7)
Neutrophils Relative %: 64 %
Platelets: 354 10*3/uL (ref 150–400)
RBC: 3.98 MIL/uL — ABNORMAL LOW (ref 4.22–5.81)
RDW: 14.3 % (ref 11.5–15.5)
WBC: 5.1 10*3/uL (ref 4.0–10.5)
nRBC: 0 % (ref 0.0–0.2)

## 2022-10-13 LAB — BASIC METABOLIC PANEL
Anion gap: 6 (ref 5–15)
BUN: 43 mg/dL — ABNORMAL HIGH (ref 8–23)
CO2: 24 mmol/L (ref 22–32)
Calcium: 9.8 mg/dL (ref 8.9–10.3)
Chloride: 104 mmol/L (ref 98–111)
Creatinine, Ser: 1.94 mg/dL — ABNORMAL HIGH (ref 0.61–1.24)
GFR, Estimated: 35 mL/min — ABNORMAL LOW (ref >60)
Glucose, Bld: 186 mg/dL — ABNORMAL HIGH (ref 70–99)
Potassium: 4.7 mmol/L (ref 3.5–5.1)
Sodium: 134 mmol/L — ABNORMAL LOW (ref 135–145)

## 2022-10-13 LAB — GLUCOSE, CAPILLARY: Glucose-Capillary: 194 mg/dL — ABNORMAL HIGH (ref 70–99)

## 2022-10-13 MED ORDER — LACTATED RINGERS IV SOLN: INTRAVENOUS | Status: DC

## 2022-10-13 MED ORDER — FENTANYL CITRATE (PF) 100 MCG/2ML IJ SOLN
INTRAMUSCULAR | Status: AC
  Filled 2022-10-13: qty 2

## 2022-10-13 MED ORDER — IODIXANOL 320 MG/ML IV SOLN
100.0000 mL | Freq: Once | INTRAVENOUS | Status: AC | PRN
  Administered 2022-10-13: 48 mL via INTRA_ARTERIAL

## 2022-10-13 MED ORDER — FENTANYL CITRATE (PF) 100 MCG/2ML IJ SOLN
INTRAMUSCULAR | Status: AC | PRN
  Administered 2022-10-13 (×5): 25 ug via INTRAVENOUS

## 2022-10-13 MED ORDER — SODIUM CHLORIDE 0.9 % IV SOLN: INTRAVENOUS | Status: DC

## 2022-10-13 MED ORDER — MIDAZOLAM HCL 2 MG/2ML IJ SOLN
INTRAMUSCULAR | Status: AC
  Filled 2022-10-13: qty 2

## 2022-10-13 MED ORDER — LIDOCAINE HCL 1 % IJ SOLN
INTRAMUSCULAR | Status: AC
  Administered 2022-10-13: 6 mL via INTRADERMAL
  Filled 2022-10-13: qty 20

## 2022-10-13 MED ORDER — HEPARIN SODIUM (PORCINE) 1000 UNIT/ML IJ SOLN
INTRAMUSCULAR | Status: AC | PRN
  Administered 2022-10-13: 6000 [IU] via INTRAVENOUS

## 2022-10-13 MED ORDER — HEPARIN SODIUM (PORCINE) 1000 UNIT/ML IJ SOLN
INTRAMUSCULAR | Status: AC
  Filled 2022-10-13: qty 10

## 2022-10-13 MED ORDER — MIDAZOLAM HCL 2 MG/2ML IJ SOLN
INTRAMUSCULAR | Status: AC | PRN
  Administered 2022-10-13 (×3): .5 mg via INTRAVENOUS
  Administered 2022-10-13: 1 mg via INTRAVENOUS

## 2022-10-13 NOTE — Sedation Documentation (Signed)
Performing Angioplasty at this time.

## 2022-10-13 NOTE — Sedation Documentation (Signed)
Angioplasty being performed at this time.  Pt tolerating well.

## 2022-10-13 NOTE — Procedures (Signed)
Interventional Radiology Procedure Note  Procedure:    US guided right CFA access. LLE angiogram IVUS left fem-pop segment. Treatment of left SFA critical stenosis in the adductor canal with DEB and supera stent, 87m CELT for hemostasis of the right CFA access site.   Complications: None  Recommendations:  - Right hip straight x 1 hr, with 1 hr bedrest for sedation recovery - Advance diet - 500 LR bolus - Advance diet - hold any metformin x 48 hrs - routine wound care - continue DAPT with '81mg'$  ASA and '75mg'$  plavix daily x 90 days, then just ASA - Do not submerge for 7 days - Follow up with Dr. WEarleen Newportin 4-6 weeks in clinic  Signed,  JDulcy Fanny WEarleen Newport DO

## 2022-10-13 NOTE — H&P (Signed)
Chief Complaint: Patient was seen in consultation today for peripheral vascular disease  Supervising Physician: Corrie Mckusick  Patient Status: [facility] - Out-pt  History of Present Illness: Michael Thomas is a 77 y.o. male with a medical history significant for BPH, DM2, obstructive sleep apnea and PAD. He has undergone intervention with angioplasty/stenting of his critical stenosis however his symptoms are recurrent.  He was recently seen in consultation with Dr. Earleen Newport 09/23/22 to discuss retreatment.  He has elected to proceed.   Mr. Call presents today in his usual state of health.  He report stable symptoms that are lifestyle limiting for him.  They are not worsened.  He has no new concerns or complaints.  Denies fever, chills, nausea, vomiting, abdominal pain, dysuria.   He does take Plavix and aspirin at home consistently and last took these yesterday.     He has been NPO today.  He has transportation home today where he lives with his wife.    Past Medical History:  Diagnosis Date   Benign prostatic hyperplasia with urinary obstruction 03/25/2015   Diabetes mellitus without complication ([place])    Hyperlipidemia    Kidney stone    OSA (obstructive sleep apnea)    Vitamin D deficiency     Past Surgical History:  Procedure Laterality Date   DG ARTHRO THUMB*L*     IR ANGIOGRAM EXTREMITY BILATERAL  02/20/2018   IR ANGIOGRAM EXTREMITY BILATERAL  04/05/2019   IR ANGIOGRAM EXTREMITY LEFT  05/07/2020   IR ANGIOGRAM EXTREMITY RIGHT  02/15/2021   IR ANGIOGRAM EXTREMITY RIGHT  12/14/2021   IR ANGIOGRAM SELECTIVE EACH ADDITIONAL VESSEL  02/20/2018   IR FEM POP ART ATHERECT INC PTA MOD SED  02/20/2018   IR FEM POP ART ATHERECT INC PTA MOD SED  05/14/2019   IR FEM POP ART ATHERECT INC PTA MOD SED  05/07/2020   IR FEM POP ART ATHERECT INC PTA MOD SED  02/15/2021   IR FEM POP ART STENT INC PTA MOD SED  12/14/2021   IR INTRAVASCULAR ULTRASOUND NON CORONARY  02/15/2021   IR INTRAVASCULAR  ULTRASOUND NON CORONARY  12/14/2021   IR RADIOLOGIST EVAL & MGMT  02/01/2018   IR RADIOLOGIST EVAL & MGMT  03/20/2018   IR RADIOLOGIST EVAL & MGMT  07/25/2018   IR RADIOLOGIST EVAL & MGMT  03/07/2019   IR RADIOLOGIST EVAL & MGMT  04/23/2019   IR RADIOLOGIST EVAL & MGMT  06/18/2019   IR RADIOLOGIST EVAL & MGMT  04/29/2020   IR RADIOLOGIST EVAL & MGMT  06/04/2020   IR RADIOLOGIST EVAL & MGMT  01/20/2021   IR RADIOLOGIST EVAL & MGMT  04/27/2021   IR RADIOLOGIST EVAL & MGMT  08/12/2021   IR RADIOLOGIST EVAL & MGMT  11/02/2021   IR RADIOLOGIST EVAL & MGMT  11/15/2021   IR RADIOLOGIST EVAL & MGMT  01/26/2022   IR RADIOLOGIST EVAL & MGMT  03/23/2022   IR RADIOLOGIST EVAL & MGMT  09/23/2022   IR US GUIDE VASC ACCESS LEFT  04/05/2019   IR US GUIDE VASC ACCESS LEFT  02/15/2021   IR US GUIDE VASC ACCESS LEFT  12/14/2021   IR US GUIDE VASC ACCESS RIGHT  02/20/2018   IR US GUIDE VASC ACCESS RIGHT  05/14/2019   IR US GUIDE VASC ACCESS RIGHT  05/07/2020   NECK SURGERY     VASECTOMY      Allergies: Amoxicillin  Medications: Prior to Admission medications   Medication Sig Start Date End Date  Taking? Authorizing Provider  aspirin EC 81 MG tablet Take 81 mg by mouth daily.    Yes [provider]  clobetasol ointment (TEMOVATE) [id number] % Apply 1 application topically 2 (two) times daily as needed (blisters).   Yes [provider]  clopidogrel (PLAVIX) 75 MG tablet Take 1 tablet (75 mg total) by mouth daily. Begin taking 75 mg Plavix daily on February 10, 2021. 02/02/21 05/03/21 Yes Covington, Roselyn Reef R, NP  fenofibrate 160 MG tablet Take 1 tablet (160 mg total) by mouth daily. 01/15/21  Yes Silverio Decamp, MD  fluticasone (FLONASE) 50 MCG/ACT nasal spray Place 1 spray into both nostrils daily as needed for allergies or rhinitis.   Yes [provider]  glipiZIDE (GLUCOTROL XL) 5 MG 24 hr tablet Take 1 tablet (5 mg total) by mouth daily with breakfast. 08/19/20  Yes Silverio Decamp, MD  JANUVIA  50 MG tablet TAKE 1 TABLET BY MOUTH EVERY DAY Patient taking differently: Take 50 mg by mouth daily. 09/16/20  Yes Silverio Decamp, MD  lisinopril-hydrochlorothiazide (ZESTORETIC) 20-25 MG tablet TAKE 1 TABLET BY MOUTH EVERY DAY Patient taking differently: Take 1 tablet by mouth daily. 11/13/20  Yes Silverio Decamp, MD  Magnesium 400 MG TABS Take 400 mg by mouth daily.   Yes [provider]  metFORMIN (GLUCOPHAGE) 1000 MG tablet TAKE 1 TABLET (1,000 MG TOTAL) BY MOUTH 2 (TWO) TIMES DAILY WITH A MEAL. 09/02/20  Yes Silverio Decamp, MD  omega-3 acid ethyl esters (LOVAZA) 1 g capsule Take 2 capsules (2 g total) by mouth 2 (two) times daily. Patient taking differently: Take 1 g by mouth 3 (three) times daily. 02/18/20  Yes Silverio Decamp, MD  simvastatin (ZOCOR) 40 MG tablet TAKE ONE TABLET BY MOUTH DAILY AT 6PM Patient taking differently: Take 40 mg by mouth daily at 6 PM. 11/10/20  Yes Silverio Decamp, MD  tamsulosin (FLOMAX) 0.4 MG CAPS capsule TAKE 1 CAPSULE BY MOUTH 2 TIMES DAILY. Patient taking differently: Take 0.4 mg by mouth 2 (two) times daily. 11/30/20  Yes Silverio Decamp, MD  AMBULATORY NON FORMULARY MEDICATION One Touch Ultra 2 glucometer 05/15/20   Silverio Decamp, MD  Blood Glucose Monitoring Suppl (ONE TOUCH ULTRA 2) w/Device KIT DM E11.8. Check fasting blood sugar every morning and 2 hours after largest meal of the day. 05/18/20   Silverio Decamp, MD  glucose blood test strip Use as instructed 01/15/21   Silverio Decamp, MD  OneTouch Delica Lancets 99991111 MISC Dx DM Ell.8. Check fasting blood sugar daily and after largest meal a couple times a week. 01/29/21   Silverio Decamp, MD     Family History  Problem Relation Age of Onset   Stroke Mother    Diabetes Mellitus II Mother    Prostate cancer Neg Hx    Kidney cancer Neg Hx    Bladder Cancer Neg Hx     Social History   Socioeconomic History   Marital status:  Married    Spouse name: Not on file   Number of children: Not on file   Years of education: Not on file   Highest education level: Not on file  Occupational History   Not on file  Tobacco Use   Smoking status: Never   Smokeless tobacco: Never  Vaping Use   Vaping Use: Never used  Substance and Sexual Activity   Alcohol use: No   Drug use: No   Sexual activity: Yes  Birth control/protection: None  Other Topics Concern   Not on file  Social History Narrative   Not on file   Social Determinants of Health   Financial Resource Strain: Not on file  Food Insecurity: No Food Insecurity (04/23/2022)   Hunger Vital Sign    Worried About Running Out of Food in the Last Year: Never true    Ran Out of Food in the Last Year: Never true  Transportation Needs: No Transportation Needs (04/23/2022)   PRAPARE - Hydrologist (Medical): No    Lack of Transportation (Non-Medical): No  Physical Activity: Not on file  Stress: Not on file  Social Connections: Not on file     Review of Systems: A 12 point ROS discussed and pertinent positives are indicated in the HPI above.  All other systems are negative.  Review of Systems  Constitutional:  Negative for fatigue and fever.  Respiratory:  Negative for cough and shortness of breath.   Cardiovascular:  Negative for chest pain.  Gastrointestinal:  Negative for abdominal pain, diarrhea and nausea.  Genitourinary:  Negative for dysuria.  Musculoskeletal:  Positive for gait problem (foot pain, trouble walking due to pain). Negative for back pain.  Psychiatric/Behavioral:  Negative for behavioral problems and confusion.     Vital Signs: BP 127/69   Pulse (!) 49   Temp 98.4 F (36.9 C) (Oral)   Resp 16   Ht '5\' 1"'$  (1.549 m)   Wt 131 lb (59.4 kg)   SpO2 98%   BMI 24.75 kg/m   Physical Exam Vitals and nursing note reviewed.  Constitutional:      General: He is not in acute distress.    Appearance: Normal  appearance. He is not ill-appearing.  HENT:     Mouth/Throat:     Mouth: Mucous membranes are moist.     Pharynx: Oropharynx is clear.  Cardiovascular:     Rate and Rhythm: Normal rate and regular rhythm.  Pulmonary:     Effort: Pulmonary effort is normal. No respiratory distress.     Breath sounds: Normal breath sounds.  Abdominal:     General: Abdomen is flat.     Palpations: Abdomen is soft.  Skin:    General: Skin is warm and dry.     Comments: No wounds.  Neurological:     General: No focal deficit present.     Mental Status: He is alert and oriented to person, place, and time. Mental status is at baseline.  Psychiatric:        Mood and Affect: Mood normal.        Behavior: Behavior normal.        Thought Content: Thought content normal.        Judgment: Judgment normal.      MD Evaluation Airway: WNL Heart: WNL Abdomen: WNL Chest/ Lungs: WNL ASA  Classification: 3 Mallampati/Airway Score: Two   Imaging: US ARTERIAL SEGMENTAL EXERCISE (USE FOR CLAUDICATION)  Result Date: 10/05/2022 CLINICAL DATA:  Preoperative planning EXAM: NONINVASIVE PHYSIOLOGIC VASCULAR STUDY OF BILATERAL LOWER EXTREMITIES TECHNIQUE: Non-invasive vascular evaluation of both lower extremities was performed at rest, including calculation of ankle-brachial indices, multiple segmental pressure evaluation, segmental Doppler and segmental pulse volume recording. COMPARISON:  None Available. FINDINGS: Right Lower Extremity Resting ABI:  1.0 Segmental Pressures: Limited utility due to non compressibility of the arteries. Great toe pressure: 104 mm Hg Arterial Waveforms: Abnormal monophasic arterial waveforms. PVRs: Relatively preserved PVRs. Left Lower Extremity: Resting ABI:  0.81 Segmental Pressures: Limited utility due to poor compressibility of the arteries. Potential pressure gradients between the upper and lower thigh cuff as well as the lower thigh cuff and calf cuff. Great toe pressure: 81 mm Hg  Arterial Waveforms: Abnormal monophasic arterial waveforms. PVRs: Mildly blunted PVRs beginning in the lower thigh. Other: Symmetric upper extremity pressures. Ankle Brachial index > 1.4 Non diagnostic secondary to incompressible vessel calcifications 1.0-1.4       Normal 0.9-0.99     Borderline PAD 0.8-0.89     Mild PAD 0.5-0.79     Moderate PAD < 0.5          Severe PAD Toe Brachial Index Normal     >0.65 Moderate  0.53-0.64 Severe     <0.23 Toe Pressures Absolute toe pressure >55 mmHg sufficient for wound healing. Toe pressures <50 mmHg = critical limb ischemia. IMPRESSION: 1. Overall, limited utility due to non compressibility of the arteries in the thigh, particularly on the right. This may be related to patient body habitus and large thigh size versus medial arteriosclerosis. 2. Normal resting right ankle-brachial index. 3. Resting left ankle-brachial index of 0.81 consistent with at least mild underlying peripheral arterial disease. 4. Segmental evaluation suggests femoropopliteal disease on the left. Signed, Criselda Peaches, MD, [facility] Electronically Signed   By: Jacqulynn Cadet M.D.   On: 10/05/2022 14:34   IR Radiologist Eval & Mgmt  Result Date: 09/23/2022 EXAM: NEW PATIENT OFFICE VISIT CHIEF COMPLAINT: Electronic medical record HISTORY OF PRESENT ILLNESS: Electronic medical record REVIEW OF SYSTEMS: Electronic medical record PHYSICAL EXAMINATION: Electronic medical record ASSESSMENT AND PLAN: Electronic medical record Electronically Signed   By: Corrie Mckusick D.O.   On: 09/23/2022 12:04    Labs:  CBC: Recent Labs    04/23/22 0551 05/02/22 0000 09/06/22 0919 10/13/22 1021  WBC 6.6 6.0 5.6 5.1  HGB 11.8* 12.3* 12.0* 11.5*  HCT 35.8* 36.9* 36.6* 34.2*  PLT 256 353 354 354     COAGS: Recent Labs    12/14/21 0714 10/13/22 1021  INR 1.0 1.0     BMP: Recent Labs    04/21/22 1715 04/22/22 0925  04/23/22 0551 05/02/22 0000 09/06/22 0919 10/13/22 1021  NA 138 139 138 134* 140 134*  K 4.5 4.1 4.3 5.0 5.0 4.7  CL 102 107 108 102 107 104  CO2 '25 25 22 23 25 24  '$ GLUCOSE 356* 252* 190* 296* 124* 186*  BUN 31* 23 22 36* 36* 43*  CALCIUM 9.8 8.7* 9.2 9.7 9.7 9.8  CREATININE 1.58* 1.49* 1.56* 1.74* 1.62* 1.94*  GFRNONAA 44* 48* 45*  --   --  35*     LIVER FUNCTION TESTS: Recent Labs    04/21/22 2156 05/02/22 0000 09/06/22 0919  BILITOT 0.1* 0.3 0.3  AST '25 14 14  '$ ALT '26 17 14  '$ ALKPHOS 34*  --   --   PROT 7.8 7.0 7.3  ALBUMIN 4.3  --   --      TUMOR MARKERS: No results for input(s): "AFPTM", "CEA", "CA199", "CHROMGRNA" in the last 8760 hours.  Assessment and Plan: Patient with past medical history of DM, peripheral vascular disease presents with complaint of symptomatic claudication/foot pain.   Case reviewed by Dr. Earleen Newport who has met the patient in consultation and approves patient for procedure.  Patient presents today in their usual state of health.  He has been NPO and is currently on aspirin and Plavix.  He  will take Pavix 225 mg PO today.  Resume regular dosing tomorrow.  He confirms he has medication at home.   Risks and benefits were discussed with the patient including, but not limited to bleeding, infection, vascular injury or contrast induced renal failure.  This interventional procedure involves the use of X-rays and because of the nature of the planned procedure, it is possible that we will have prolonged use of X-ray fluoroscopy.  Potential radiation risks to you include (but are not limited to) the following: - A slightly elevated risk for cancer  several years later in life. This risk is typically less than 0.5% percent. This risk is low in comparison to the normal incidence of human cancer, which is 33% for women and 50% for men according to the [employer]. - Radiation induced injury can include skin redness, resembling a rash, tissue  breakdown / ulcers and hair loss (which can be temporary or permanent).   The likelihood of either of these occurring depends on the difficulty of the procedure and whether you are sensitive to radiation due to previous procedures, disease, or genetic conditions.   IF your procedure requires a prolonged use of radiation, you will be notified and given written instructions for further action.  It is your responsibility to monitor the irradiated area for the 2 weeks following the procedure and to notify your physician if you are concerned that you have suffered a radiation induced injury.    All of the patient's questions were answered, patient is agreeable to proceed.  Consent signed and in chart.  Thank you for this interesting consult.  I greatly enjoyed meeting Giorgi Claude Fraser and look forward to participating in their care.  A copy of this report was sent to the requesting provider on this date.  Electronically Signed: Docia Barrier, PA 10/13/2022, 11:04 AM   I spent a total of  30 Minutes   in face to face in clinical consultation, greater than 50% of which was counseling/coordinating care for peripheral vascular disease, stenosis.

## 2022-10-13 NOTE — Sedation Documentation (Signed)
This RN witness pt taking x3 Plavix pills ('75mg'$  total) PO per MD approval with prior discussion with pt. Pt successfully able to take medication without complaints/concerns.

## 2022-10-19 ENCOUNTER — Other Ambulatory Visit: Payer: Self-pay | Admitting: Interventional Radiology

## 2022-10-19 DIAGNOSIS — N401 Enlarged prostate with lower urinary tract symptoms: Secondary | ICD-10-CM | POA: Diagnosis not present

## 2022-10-19 DIAGNOSIS — R350 Frequency of micturition: Secondary | ICD-10-CM | POA: Diagnosis not present

## 2022-10-19 DIAGNOSIS — R3915 Urgency of urination: Secondary | ICD-10-CM | POA: Diagnosis not present

## 2022-10-19 DIAGNOSIS — I739 Peripheral vascular disease, unspecified: Secondary | ICD-10-CM

## 2022-10-26 DIAGNOSIS — N400 Enlarged prostate without lower urinary tract symptoms: Secondary | ICD-10-CM | POA: Diagnosis not present

## 2022-10-26 DIAGNOSIS — D631 Anemia in chronic kidney disease: Secondary | ICD-10-CM | POA: Diagnosis not present

## 2022-10-26 DIAGNOSIS — R8290 Unspecified abnormal findings in urine: Secondary | ICD-10-CM | POA: Diagnosis not present

## 2022-10-26 DIAGNOSIS — N281 Cyst of kidney, acquired: Secondary | ICD-10-CM | POA: Diagnosis not present

## 2022-10-26 DIAGNOSIS — N1832 Chronic kidney disease, stage 3b: Secondary | ICD-10-CM | POA: Diagnosis not present

## 2022-10-26 DIAGNOSIS — R809 Proteinuria, unspecified: Secondary | ICD-10-CM | POA: Diagnosis not present

## 2022-10-26 DIAGNOSIS — K760 Fatty (change of) liver, not elsewhere classified: Secondary | ICD-10-CM | POA: Diagnosis not present

## 2022-10-26 DIAGNOSIS — E1122 Type 2 diabetes mellitus with diabetic chronic kidney disease: Secondary | ICD-10-CM | POA: Diagnosis not present

## 2022-10-26 DIAGNOSIS — E785 Hyperlipidemia, unspecified: Secondary | ICD-10-CM | POA: Diagnosis not present

## 2022-10-26 DIAGNOSIS — N16 Renal tubulo-interstitial disorders in diseases classified elsewhere: Secondary | ICD-10-CM | POA: Diagnosis not present

## 2022-10-26 DIAGNOSIS — I10 Essential (primary) hypertension: Secondary | ICD-10-CM | POA: Diagnosis not present

## 2022-11-01 DIAGNOSIS — R809 Proteinuria, unspecified: Secondary | ICD-10-CM | POA: Diagnosis not present

## 2022-11-01 DIAGNOSIS — R8290 Unspecified abnormal findings in urine: Secondary | ICD-10-CM | POA: Diagnosis not present

## 2022-11-01 DIAGNOSIS — N281 Cyst of kidney, acquired: Secondary | ICD-10-CM | POA: Diagnosis not present

## 2022-11-01 DIAGNOSIS — N400 Enlarged prostate without lower urinary tract symptoms: Secondary | ICD-10-CM | POA: Diagnosis not present

## 2022-11-01 DIAGNOSIS — D631 Anemia in chronic kidney disease: Secondary | ICD-10-CM | POA: Diagnosis not present

## 2022-11-01 DIAGNOSIS — E1122 Type 2 diabetes mellitus with diabetic chronic kidney disease: Secondary | ICD-10-CM | POA: Diagnosis not present

## 2022-11-01 DIAGNOSIS — E785 Hyperlipidemia, unspecified: Secondary | ICD-10-CM | POA: Diagnosis not present

## 2022-11-01 DIAGNOSIS — N1832 Chronic kidney disease, stage 3b: Secondary | ICD-10-CM | POA: Diagnosis not present

## 2022-11-01 DIAGNOSIS — I10 Essential (primary) hypertension: Secondary | ICD-10-CM | POA: Diagnosis not present

## 2022-11-01 DIAGNOSIS — N16 Renal tubulo-interstitial disorders in diseases classified elsewhere: Secondary | ICD-10-CM | POA: Diagnosis not present

## 2022-11-01 DIAGNOSIS — K760 Fatty (change of) liver, not elsewhere classified: Secondary | ICD-10-CM | POA: Diagnosis not present

## 2022-11-25 ENCOUNTER — Other Ambulatory Visit: Payer: Self-pay | Admitting: Interventional Radiology

## 2022-11-25 ENCOUNTER — Ambulatory Visit
Admission: RE | Admit: 2022-11-25 | Discharge: 2022-11-25 | Disposition: A | Discharge status: Home / Self Care | Payer: PPO | Source: Ambulatory Visit | Attending: Interventional Radiology | Admitting: Interventional Radiology

## 2022-11-25 DIAGNOSIS — I739 Peripheral vascular disease, unspecified: Secondary | ICD-10-CM

## 2022-11-25 HISTORY — PX: IR RADIOLOGIST EVAL & MGMT: IMG5224

## 2022-11-25 NOTE — Progress Notes (Signed)
Chief Complaint: Claudication   Referring Physician(s): Dr. Benjamin Stain   History of Present Illness: Michael Thomas is a 77 y.o. male presenting as a scheduled follow up to [facility] today, with bilateral symptomatic PAD.    Michael Thomas joins [place] today with his wife in the clinic.    History: We first met Michael Thomas 02/01/2018, for complaints of left sided claudication.  He had an opinion closer to his home in [place], but sought second opinion with ongoing symptoms after MRA demonstrated femoral-popliteal etiology.    Treatment history has been: 02/20/18: Left SFA directional atherectomy and DEB 05/14/2019: Right SFA directional atherectomy and DEB 05/07/20: Left SFA laser atherectomy and DEB for recurrent stenosis 02/15/21: Right SFA directional atherectomy and DEB for recurrent stenosis 12/14/2021: Right SFA PTA/Stenting of recurrent, symptomatic, multi-focal SFA stenosis, with supera.  10/13/22: Left SFA  PTA/stenting of adductor stenosis with supera   03/23/22 ABI/Non-invasive: Rigtht ABI: 1.05 Left ABI: 0.99. post exercise: 0.35.   10/01/22 ABI/Non-invasive Right ABI: 1.0 Left ABI: 0.81   Interval:  We treated Michael Thomas 10/13/22 with right CFA access, Left LE angio, and PTA stenting of adductor stenosis, with IVUS.   He tells me today that the symptoms of claudication in the left leg are "completely gone."  He does report that when he is on the treadmill at the [facility], walking on an incline, that he has some bilateral calf cramping.  He confirms that he is able to walk normally without any symptoms in the neighborhood with his wife, and around the community without any problems.  He is currently on maximal medical therapy, including DAPT.   He has no wounds.       Past Medical History:  Diagnosis Date   Benign prostatic hyperplasia with urinary obstruction 03/25/2015   Diabetes mellitus without complication (HCC)    Hyperlipidemia    Kidney stone    OSA (obstructive sleep  apnea)    Vitamin D deficiency     Past Surgical History:  Procedure Laterality Date   DG ARTHRO THUMB*L*     IR ANGIOGRAM EXTREMITY BILATERAL  02/20/2018   IR ANGIOGRAM EXTREMITY BILATERAL  04/05/2019   IR ANGIOGRAM EXTREMITY LEFT  05/07/2020   IR ANGIOGRAM EXTREMITY LEFT  10/13/2022   IR ANGIOGRAM EXTREMITY RIGHT  02/15/2021   IR ANGIOGRAM EXTREMITY RIGHT  12/14/2021   IR ANGIOGRAM SELECTIVE EACH ADDITIONAL VESSEL  02/20/2018   IR ANGIOGRAM SELECTIVE EACH ADDITIONAL VESSEL  10/13/2022   IR FEM POP ART ATHERECT INC PTA MOD SED  02/20/2018   IR FEM POP ART ATHERECT INC PTA MOD SED  05/14/2019   IR FEM POP ART ATHERECT INC PTA MOD SED  05/07/2020   IR FEM POP ART ATHERECT INC PTA MOD SED  02/15/2021   IR FEM POP ART STENT INC PTA MOD SED  12/14/2021   IR ILIAC ART STENT INC PTA MOD SED  10/13/2022   IR INTRAVASCULAR ULTRASOUND NON CORONARY  02/15/2021   IR INTRAVASCULAR ULTRASOUND NON CORONARY  12/14/2021   IR INTRAVASCULAR ULTRASOUND NON CORONARY  10/13/2022   IR RADIOLOGIST EVAL & MGMT  02/01/2018   IR RADIOLOGIST EVAL & MGMT  03/20/2018   IR RADIOLOGIST EVAL & MGMT  07/25/2018   IR RADIOLOGIST EVAL & MGMT  03/07/2019   IR RADIOLOGIST EVAL & MGMT  04/23/2019   IR RADIOLOGIST EVAL & MGMT  06/18/2019   IR RADIOLOGIST EVAL & MGMT  04/29/2020   IR RADIOLOGIST EVAL & MGMT  06/04/2020  IR RADIOLOGIST EVAL & MGMT  01/20/2021   IR RADIOLOGIST EVAL & MGMT  04/27/2021   IR RADIOLOGIST EVAL & MGMT  08/12/2021   IR RADIOLOGIST EVAL & MGMT  11/02/2021   IR RADIOLOGIST EVAL & MGMT  11/15/2021   IR RADIOLOGIST EVAL & MGMT  01/26/2022   IR RADIOLOGIST EVAL & MGMT  03/23/2022   IR RADIOLOGIST EVAL & MGMT  09/23/2022   IR US GUIDE VASC ACCESS LEFT  04/05/2019   IR US GUIDE VASC ACCESS LEFT  02/15/2021   IR US GUIDE VASC ACCESS LEFT  12/14/2021   IR US GUIDE VASC ACCESS RIGHT  02/20/2018   IR US GUIDE VASC ACCESS RIGHT  05/14/2019   IR US GUIDE VASC ACCESS RIGHT  05/07/2020   IR US GUIDE VASC ACCESS RIGHT  10/13/2022   NECK SURGERY      VASECTOMY      Allergies: Amoxicillin  Medications: Prior to Admission medications   Medication Sig Start Date End Date Taking? Authorizing Provider  acetaminophen (TYLENOL) 650 MG CR tablet Take 1 tablet (650 mg total) by mouth every 8 (eight) hours as needed for pain. 05/02/22   Monica Becton, MD  AMBULATORY NON FORMULARY MEDICATION One Touch Ultra 2 glucometer 05/15/20   Monica Becton, MD  aspirin EC 81 MG tablet Take 81 mg by mouth at bedtime.    [provider]  Blood Glucose Monitoring Suppl (ONETOUCH VERIO REFLECT) w/Device KIT Check fasting blood sugar every morning and 2 hours after largest meal of the day., 11/22/21   Monica Becton, MD  clobetasol ointment (TEMOVATE) 0.05 % Apply 1 Application topically 2 (two) times daily as needed (blisters). 03/15/22   Monica Becton, MD  clopidogrel (PLAVIX) 75 MG tablet Take 1 tablet (75 mg total) by mouth daily. Start taking 1 week before your procedure (angiogram.) Radiology schedulers will call you to set up the procedure. 09/23/22   Han, Aimee H, PA-C  fenofibrate 160 MG tablet Take 1 tablet by mouth once daily 09/21/22   Monica Becton, MD  fluticasone [facility]) 50 MCG/ACT nasal spray Place 1 spray into both nostrils daily as needed for allergies or rhinitis.    [provider]  glipiZIDE (GLUCOTROL XL) 10 MG 24 hr tablet TAKE 1 TABLET BY MOUTH EVERY DAY WITH BREAKFAST 02/16/22   Monica Becton, MD  glucose blood (ONETOUCH VERIO) test strip Check fasting blood sugar every morning and 2 hours after largest meal of the day., 11/22/21   Monica Becton, MD  Lancets (ONETOUCH DELICA PLUS LANCET33G) MISC USE UP TO 3 TIMES DAILY. 05/05/22   Monica Becton, MD  lisinopril-hydrochlorothiazide (ZESTORETIC) 20-25 MG tablet Take 1 tablet by mouth daily. 02/16/22   Monica Becton, MD  magnesium oxide (MAG-OX) 400 MG tablet Take 2 tablets (800 mg total) by mouth at  bedtime. Patient taking differently: Take 400 mg by mouth as needed (cramps). 08/18/21   Monica Becton, MD  metFORMIN (GLUCOPHAGE) 1000 MG tablet Take 1 tablet (1,000 mg total) by mouth 2 (two) times daily with a meal. 06/24/22   Monica Becton, MD  omega-3 acid ethyl esters (LOVAZA) 1 g capsule Take 2 capsules (2 g total) by mouth 2 (two) times daily. 02/16/22   Monica Becton, MD  simvastatin (ZOCOR) 40 MG tablet Take 1 tablet (40 mg total) by mouth daily at 6 PM. Patient taking differently: Take 40 mg by mouth at bedtime. 03/29/22   Monica Becton,  MD  sitaGLIPtin (JANUVIA) 100 MG tablet Take 1 tablet (100 mg total) by mouth at bedtime. 08/17/22   Monica Becton, MD  tamsulosin (FLOMAX) 0.4 MG CAPS capsule Take 1 capsule (0.4 mg total) by mouth 2 (two) times daily. 04/12/22   Monica Becton, MD     Family History  Problem Relation Age of Onset   Stroke Mother    Diabetes Mellitus II Mother    Prostate cancer Neg Hx    Kidney cancer Neg Hx    Bladder Cancer Neg Hx     Social History   Socioeconomic History   Marital status: Married    Spouse name: Not on file   Number of children: Not on file   Years of education: Not on file   Highest education level: Not on file  Occupational History   Not on file  Tobacco Use   Smoking status: Never   Smokeless tobacco: Never  Vaping Use   Vaping Use: Never used  Substance and Sexual Activity   Alcohol use: No   Drug use: No   Sexual activity: Yes    Birth control/protection: None  Other Topics Concern   Not on file  Social History Narrative   Not on file   Social Determinants of Health   Financial Resource Strain: Not on file  Food Insecurity: No Food Insecurity (04/23/2022)   Hunger Vital Sign    Worried About Running Out of Food in the Last Year: Never true    Ran Out of Food in the Last Year: Never true  Transportation Needs: No Transportation Needs (04/23/2022)   PRAPARE -  Administrator, Civil Service (Medical): No    Lack of Transportation (Non-Medical): No  Physical Activity: Not on file  Stress: Not on file  Social Connections: Not on file       Review of Systems: A 12 point ROS discussed and pertinent positives are indicated in the HPI above.  All other systems are negative.  Review of Systems  Vital Signs: BP 138/70 (BP Location: Left Arm, Patient Position: Sitting, Cuff Size: Normal)   Pulse 60   Temp 98.2 F (36.8 C) (Oral)   Wt 59.4 kg   SpO2 98% Comment: room air  BMI 24.75 kg/m   Advance Care Plan: The advanced care plan/surrogate decision maker was discussed at the time of visit and documented in the medical record.    Physical Exam General: 77 yo male appearing stated age.  Well-developed, well-nourished.  No distress. HEENT: Atraumatic, normocephalic.  Conjugate gaze, extra-ocular motor intact. No scleral icterus or scleral injection. No lesions on external ears, nose, lips, or gums.  Oral mucosa moist, pink.  Neck: Symmetric with no goiter enlargement.  Chest/Lungs:  Symmetric chest with inspiration/expiration.  No labored breathing.    Heart:    No JVD appreciated.   Genito-urinary: Deferred Neurologic: Alert & Oriented to person, place, and time.   Normal affect and insight.  Appropriate questions.  Moving all 4 extremities with gross sensory intact.  Pulse Exam:  Palpable pulses at the bilateral DP and PT.  Extremities: No wound.       Imaging: No results found.  Labs:  CBC: Recent Labs    04/23/22 0551 05/02/22 0000 09/06/22 0919 10/13/22 1021  WBC 6.6 6.0 5.6 5.1  HGB 11.8* 12.3* 12.0* 11.5*  HCT 35.8* 36.9* 36.6* 34.2*  PLT 256 353 354 354    COAGS: Recent Labs    12/14/21 0714 10/13/22  1021  INR 1.0 1.0    BMP: Recent Labs    04/21/22 1715 04/22/22 0925 04/23/22 0551 05/02/22 0000 09/06/22 0919 10/13/22 1021  NA 138 139 138 134* 140 134*  K 4.5 4.1 4.3 5.0 5.0 4.7  CL 102 107  108 102 107 104  CO2 GLUCOSE 356* 252* 190* 296* 124* 186*  BUN 31* 23 22 36* 36* 43*  CALCIUM 9.8 8.7* 9.2 9.7 9.7 9.8  CREATININE 1.58* 1.49* 1.56* 1.74* 1.62* 1.94*  GFRNONAA 44* 48* 45*  --   --  35*    LIVER FUNCTION TESTS: Recent Labs    04/21/22 2156 05/02/22 0000 09/06/22 0919  BILITOT 0.1* 0.3 0.3  AST ALT ALKPHOS 34*  --   --   PROT 7.8 7.0 7.3  ALBUMIN 4.3  --   --     TUMOR MARKERS: No results for input(s): "AFPTM", "CEA", "CA199", "CHROMGRNA" in the last 8760 hours.  Assessment and Plan:   Assessment:  Michael Thomas is a 77yo male with history of PAD, having been treated multiple times for bilateral lower extremity short-distance claudication, most recently the left 10/13/22.   He has had complete resolution of the symptoms on both left and right with typical walking around the community and the neighborhood.  He does complain of some cramping in the calves when walking on uphill incline on the treadmill when working out.    Regarding medical management, maximal medical therapy for reduction of risk factors is indicated as recommended by updated AHA guidelines1.  This includes anti-platelet medication, tight blood glucose control to a HbA1c < 7, tight blood pressure control, maximum-dose HMG-CoA reductase inhibitor     Continue current exercise, and we will get a new baseline ABI resting/post exercise.    Plan: - At his convenience in the next few weeks, we will get a new resting/post-exercise ABI study  - We will plan on seeing him back in the clinic in about 6 months from now.  No imaging needed at that time.  - continue ASA daily and plavix  daily until 90 days expire, which will be about June 7 or so. . - Continue maximal medical therapy  Electronically Signed: Gilmer Mor 11/25/2022, 12:53 PM   I spent a total of    25 Minutes in face to face in clinical consultation, greater than 50% of which was  counseling/coordinating care for bilateral lower extremity PAD, previous prior treatment.

## 2022-11-28 ENCOUNTER — Encounter: Payer: Self-pay | Admitting: Sports Medicine

## 2022-11-28 ENCOUNTER — Other Ambulatory Visit: Payer: Self-pay | Admitting: Sports Medicine

## 2022-11-28 ENCOUNTER — Ambulatory Visit (INDEPENDENT_AMBULATORY_CARE_PROVIDER_SITE_OTHER): Payer: PPO

## 2022-11-28 ENCOUNTER — Ambulatory Visit (INDEPENDENT_AMBULATORY_CARE_PROVIDER_SITE_OTHER): Payer: PPO | Admitting: Sports Medicine

## 2022-11-28 VITALS — BP 137/71 | HR 61 | Ht 61.0 in | Wt 133.0 lb

## 2022-11-28 DIAGNOSIS — R809 Proteinuria, unspecified: Secondary | ICD-10-CM | POA: Diagnosis not present

## 2022-11-28 DIAGNOSIS — M5416 Radiculopathy, lumbar region: Secondary | ICD-10-CM | POA: Diagnosis not present

## 2022-11-28 DIAGNOSIS — I10 Essential (primary) hypertension: Secondary | ICD-10-CM | POA: Diagnosis not present

## 2022-11-28 DIAGNOSIS — M545 Low back pain, unspecified: Secondary | ICD-10-CM

## 2022-11-28 DIAGNOSIS — E1129 Type 2 diabetes mellitus with other diabetic kidney complication: Secondary | ICD-10-CM

## 2022-11-28 DIAGNOSIS — E785 Hyperlipidemia, unspecified: Secondary | ICD-10-CM | POA: Diagnosis not present

## 2022-11-28 DIAGNOSIS — E1121 Type 2 diabetes mellitus with diabetic nephropathy: Secondary | ICD-10-CM | POA: Diagnosis not present

## 2022-11-28 DIAGNOSIS — E1165 Type 2 diabetes mellitus with hyperglycemia: Secondary | ICD-10-CM

## 2022-11-28 DIAGNOSIS — M47816 Spondylosis without myelopathy or radiculopathy, lumbar region: Secondary | ICD-10-CM | POA: Insufficient documentation

## 2022-11-28 LAB — POCT GLYCOSYLATED HEMOGLOBIN (HGB A1C): Hemoglobin A1C: 7 % — AB (ref 4.0–5.6)

## 2022-11-28 MED ORDER — FENOFIBRATE 160 MG PO TABS: 160.0000 mg | ORAL_TABLET | Freq: Every day | ORAL | 3 refills | Status: DC

## 2022-11-28 NOTE — Assessment & Plan Note (Addendum)
>>  ASSESSMENT AND PLAN FOR TYPE 2 DIABETES MELLITUS WITH DIABETIC CHRONIC KIDNEY DISEASE (HCC) WRITTEN ON 11/28/2022  9:22 AM BY Habeeb Puertas, DEBBY PARAS, MD  A1c is better controlled at 7% today, of note he does take Januvia  when out of the donut hole and Actos  when in the donut hole. His nephrologist has asked him to cut back on the glipizide , I think is fine as his A1c is still controlled but we still have to be practical about our approach, as well as cost conscious.   >>ASSESSMENT AND PLAN FOR DIABETIC NEPHROPATHY ASSOCIATED WITH TYPE 2 DIABETES MELLITUS (HCC) WRITTEN ON 11/28/2022  9:21 AM BY Tierria Watson, DEBBY PARAS, MD  Diabetic nephropathy, currently on Januvia , glipizide , metformin , he has seen nephrology as his microalbumin creatinine ratio was elevated. Diabetes is under better control.

## 2022-11-28 NOTE — Assessment & Plan Note (Signed)
Recent increasing low back pain with lumbar radiculopathy, he does have peripheral arterial disease and is status post a couple of stents in the lower extremities, still having claudication, it is possible some of this is neurogenic claudication, adding lumbar spine x-rays, home conditioning, return to see me in 6 weeks, MR lumbar spine if no better.

## 2022-11-28 NOTE — Addendum Note (Signed)
Addended by: Carren Rang A on: 11/28/2022 09:25 AM   Modules accepted: Orders

## 2022-11-28 NOTE — Progress Notes (Signed)
    Procedures performed today:    None.  Independent interpretation of notes and tests performed by another provider:   None.  Brief History, Exam, Impression, and Recommendations:    Diabetic nephropathy associated with type 2 diabetes mellitus (HCC) Diabetic nephropathy, currently on Januvia, glipizide, metformin, he has seen nephrology as his microalbumin creatinine ratio was elevated. Diabetes is under better control.  Type 2 diabetes mellitus with renal complication A1c is better controlled at 7% today, of note he does take Januvia when out of the donut hole and Actos when in the donut hole. His nephrologist has asked him to cut back on the glipizide, I think is fine as his A1c is still controlled but we still have to be practical about our approach, as well as cost conscious.  Lumbar back pain with radiculopathy affecting left lower extremity Recent increasing low back pain with lumbar radiculopathy, he does have peripheral arterial disease and is status post a couple of stents in the lower extremities, still having claudication, it is possible some of this is neurogenic claudication, adding lumbar spine x-rays, home conditioning, return to see me in 6 weeks, MR lumbar spine if no better.    ____________________________________________ Ihor Austin. Benjamin Stain, M.D., ABFM., CAQSM., AME. Primary Care and Sports Medicine [facility]  Adjunct Professor of Family Medicine  [place] [employer] [facility]  Restaurant manager, fast food

## 2022-11-28 NOTE — Assessment & Plan Note (Signed)
Diabetic nephropathy, currently on Januvia, glipizide, metformin, he has seen nephrology as his microalbumin creatinine ratio was elevated. Diabetes is under better control.

## 2022-12-06 ENCOUNTER — Other Ambulatory Visit: Payer: Self-pay | Admitting: Physician Assistant

## 2022-12-06 ENCOUNTER — Other Ambulatory Visit: Payer: Self-pay | Admitting: Sports Medicine

## 2022-12-06 MED ORDER — CLOPIDOGREL BISULFATE 75 MG PO TABS: 75.0000 mg | ORAL_TABLET | Freq: Every day | ORAL | 0 refills | Status: DC

## 2022-12-08 ENCOUNTER — Ambulatory Visit: Payer: PPO | Admitting: Sports Medicine

## 2022-12-08 ENCOUNTER — Other Ambulatory Visit: Payer: Self-pay | Admitting: Sports Medicine

## 2022-12-15 ENCOUNTER — Ambulatory Visit
Admission: RE | Admit: 2022-12-15 | Discharge: 2022-12-15 | Disposition: A | Discharge status: Home / Self Care | Payer: PPO | Source: Ambulatory Visit | Attending: Interventional Radiology | Admitting: Interventional Radiology

## 2022-12-15 DIAGNOSIS — I739 Peripheral vascular disease, unspecified: Secondary | ICD-10-CM | POA: Diagnosis not present

## 2023-01-09 ENCOUNTER — Ambulatory Visit: Payer: PPO | Admitting: Sports Medicine

## 2023-01-11 ENCOUNTER — Ambulatory Visit (INDEPENDENT_AMBULATORY_CARE_PROVIDER_SITE_OTHER): Payer: PPO | Admitting: Sports Medicine

## 2023-01-11 DIAGNOSIS — M5416 Radiculopathy, lumbar region: Secondary | ICD-10-CM

## 2023-01-11 DIAGNOSIS — N401 Enlarged prostate with lower urinary tract symptoms: Secondary | ICD-10-CM | POA: Diagnosis not present

## 2023-01-11 NOTE — Assessment & Plan Note (Signed)
Michael Thomas does have over active bladder with BPH, he was started on Solifenacin by his urologist.

## 2023-01-11 NOTE — Progress Notes (Signed)
    Procedures performed today:    None.  Independent interpretation of notes and tests performed by another provider:   None.  Brief History, Exam, Impression, and Recommendations:    BPH (benign prostatic hyperplasia) with detrusor instability/OAB Michael Thomas does have over active bladder with BPH, he was started on Solifenacin by his urologist.   Lumbar back pain with radiculopathy affecting left lower extremity Continued low back pain although better since he has stopped going to the gym. He does have some claudication symptoms, he has been manage by vascular interventional radiology with stenting but continues to have Discomfort with walking, we are in the process of investigating whether this is related to more neurogenic claudication. At this point he is doing well so we will hold off on additional imaging but if symptoms worsen when he goes back to the gym we will get an MRI and discuss gabapentin.  I spent 30 minutes of total time managing this patient today, this includes chart review, face to face, and non-face to face time.  ____________________________________________ Michael Thomas. Benjamin Stain, M.D., ABFM., CAQSM., AME. Primary Care and Sports Medicine [facility]  Adjunct Professor of Family Medicine  [place] [employer] [facility] [employer]  Restaurant manager, fast food

## 2023-01-11 NOTE — Assessment & Plan Note (Signed)
Continued low back pain although better since he has stopped going to the gym. He does have some claudication symptoms, he has been manage by vascular interventional radiology with stenting but continues to have Discomfort with walking, we are in the process of investigating whether this is related to more neurogenic claudication. At this point he is doing well so we will hold off on additional imaging but if symptoms worsen when he goes back to the gym we will get an MRI and discuss gabapentin.

## 2023-01-18 ENCOUNTER — Encounter: Payer: Self-pay | Admitting: Dietician

## 2023-01-18 ENCOUNTER — Encounter: Payer: PPO | Attending: Sports Medicine | Admitting: Dietician

## 2023-01-18 VITALS — Ht 61.0 in | Wt 135.0 lb

## 2023-01-18 DIAGNOSIS — R809 Proteinuria, unspecified: Secondary | ICD-10-CM | POA: Insufficient documentation

## 2023-01-18 DIAGNOSIS — N189 Chronic kidney disease, unspecified: Secondary | ICD-10-CM | POA: Insufficient documentation

## 2023-01-18 DIAGNOSIS — I10 Essential (primary) hypertension: Secondary | ICD-10-CM

## 2023-01-18 DIAGNOSIS — Z713 Dietary counseling and surveillance: Secondary | ICD-10-CM | POA: Insufficient documentation

## 2023-01-18 DIAGNOSIS — E1122 Type 2 diabetes mellitus with diabetic chronic kidney disease: Secondary | ICD-10-CM | POA: Insufficient documentation

## 2023-01-18 NOTE — Patient Instructions (Signed)
Keep portions of meats/ protein foods to palm-size or less.  Continue to control amounts of carb foods.  Keep up healthy food choices, great job!

## 2023-01-18 NOTE — Progress Notes (Signed)
Medical Nutrition Therapy: Visit start time: 1110  end time: 1220  Assessment:   Referral Diagnosis: Type 2 Diabetes Other medical history/ diagnoses: CKD Psychosocial issues/ stress concerns: none  Medications, supplements: reconciled list in MEDICAL RECORD NUMBERdiabetes meds = Metformin 1000mg  2x daily with meals, Glipizide 10mg  1x daily with breakfast; Januvia 100mg  1x daily at bedtime   Preferred learning method:  Visual    Current weight: 135lbs Height: 5'1" BMI: 25.51 Patient's personal weight goal: maintain  Progress and evaluation:  Patient and spouse report making diet changes to improve BG control and prevent progression of CKD.  No deep fried foods at all, limiting restaurant meals; avoiding carb foods at night limiting/ avoiding added salts. They have been using some gluten free products in case they were better for BG control. Travels frequently ie visiting family, cruises, etc which affects diet choices and availability. Recent labs indicate HbA1C 8.1% (09/06/22), GFR 39, BUN 34, creatinine 1.76, sodium 136, potassium 4.7, phosphorus 3.6, glucose 131 (10/26/22) Food allergies: none Special diet practices: none Patient seeks help with BG control, renal diet and guidelines for sodium intake    Dietary Intake:  Usual eating pattern includes 3 meals and 1 snacks per day. Dining out frequency: 1 meals per week. Who plans meals/ buys groceries? Self, spouse Who prepares meals? Self, spouse  Breakfast: sourdough bread toast/ whole grain, grits (due to gluten free) Snack: none Lunch: cauliflower rice mixed with basmati rice (does not like brown rice) + veg ie spinach/ peas and carrots, etc + shrimp/ chicken/ some beef Snack: likes chips, has decreased -- needs ideas for healthy snacks Supper: salad with light dressing, asparagus and salmon/ chicken yogurt based marinade Snack: none Beverages: water, 2c coffee in am, occ tea in pm, no sodas  Physical activity: gym 45 min 3-5  times a week   Intervention:   Nutrition Care Education:   Basic nutrition: basic food groups; appropriate nutrient balance; appropriate meal and snack schedule; general nutrition guidelines    Diabetes: appropriate meal and snack schedule; appropriate carb intake and balance, healthy carb choices; role of fiber, protein, fat Hypertension: identifying high sodium foods, goal for sodium intake CKD: Controlling protein portions and daily intake; importance of limiting sodium intake and controlling BP; limiting/ avoiding processed foods with phosphorus additives; limiting naturally occurring phosphorus and potassium if blood levels increase above normal ranges  Other intervention notes: Patient has been working on dietary changes to control carb intake, sodium, and phosphorus. Spouse provides support. He is motivated to continue. Established goals for additional change with input from patient.  No follow up scheduled at this time; patient to schedule later as needed.   Nutritional Diagnosis:  [place]-2.1 Inpaired nutrition utilization and [place]-2.2 Altered nutrition-related laboratory As related to diabetes and CKD.  As evidenced by elevated HbA1C and blood sugar; low GFR with electrolytes in normal ranges.   Education Materials given:  Designer, industrial/product with food lists, sample meal pattern Sample menus Renal Disease food pyramid with food lists ([place]) Visit summary with goals/ instructions   Learner/ who was taught:  Patient  Spouse/ partner  Level of understanding: Verbalizes/ demonstrates competency  Demonstrated degree of understanding via:   Teach back Learning barriers: None  Willingness to learn/ readiness for change: Eager, change in progress  Monitoring and Evaluation:  Dietary intake, exercise, BG control, renal function, and body weight      follow up: prn

## 2023-01-23 ENCOUNTER — Other Ambulatory Visit: Payer: Self-pay | Admitting: Sports Medicine

## 2023-02-05 ENCOUNTER — Other Ambulatory Visit: Payer: Self-pay | Admitting: Sports Medicine

## 2023-02-05 DIAGNOSIS — E118 Type 2 diabetes mellitus with unspecified complications: Secondary | ICD-10-CM

## 2023-02-14 ENCOUNTER — Other Ambulatory Visit: Payer: Self-pay | Admitting: Sports Medicine

## 2023-02-14 DIAGNOSIS — E1122 Type 2 diabetes mellitus with diabetic chronic kidney disease: Secondary | ICD-10-CM | POA: Diagnosis not present

## 2023-02-14 DIAGNOSIS — K760 Fatty (change of) liver, not elsewhere classified: Secondary | ICD-10-CM | POA: Diagnosis not present

## 2023-02-14 DIAGNOSIS — I10 Essential (primary) hypertension: Secondary | ICD-10-CM | POA: Diagnosis not present

## 2023-02-14 DIAGNOSIS — R809 Proteinuria, unspecified: Secondary | ICD-10-CM | POA: Diagnosis not present

## 2023-02-14 DIAGNOSIS — N281 Cyst of kidney, acquired: Secondary | ICD-10-CM | POA: Diagnosis not present

## 2023-02-14 DIAGNOSIS — R8290 Unspecified abnormal findings in urine: Secondary | ICD-10-CM | POA: Diagnosis not present

## 2023-02-14 DIAGNOSIS — E118 Type 2 diabetes mellitus with unspecified complications: Secondary | ICD-10-CM

## 2023-02-14 DIAGNOSIS — E1169 Type 2 diabetes mellitus with other specified complication: Secondary | ICD-10-CM

## 2023-02-14 DIAGNOSIS — D631 Anemia in chronic kidney disease: Secondary | ICD-10-CM | POA: Diagnosis not present

## 2023-02-14 DIAGNOSIS — N1832 Chronic kidney disease, stage 3b: Secondary | ICD-10-CM | POA: Diagnosis not present

## 2023-02-14 DIAGNOSIS — E785 Hyperlipidemia, unspecified: Secondary | ICD-10-CM | POA: Diagnosis not present

## 2023-02-14 DIAGNOSIS — N16 Renal tubulo-interstitial disorders in diseases classified elsewhere: Secondary | ICD-10-CM | POA: Diagnosis not present

## 2023-02-14 DIAGNOSIS — N400 Enlarged prostate without lower urinary tract symptoms: Secondary | ICD-10-CM | POA: Diagnosis not present

## 2023-02-27 ENCOUNTER — Other Ambulatory Visit: Payer: Self-pay | Admitting: Interventional Radiology

## 2023-02-27 DIAGNOSIS — I739 Peripheral vascular disease, unspecified: Secondary | ICD-10-CM

## 2023-03-09 ENCOUNTER — Other Ambulatory Visit: Payer: Self-pay | Admitting: Sports Medicine

## 2023-03-13 ENCOUNTER — Ambulatory Visit: Admission: RE | Admit: 2023-03-13 | Payer: PPO | Source: Ambulatory Visit

## 2023-03-13 DIAGNOSIS — I739 Peripheral vascular disease, unspecified: Secondary | ICD-10-CM | POA: Diagnosis not present

## 2023-03-22 ENCOUNTER — Ambulatory Visit
Admission: RE | Admit: 2023-03-22 | Discharge: 2023-03-22 | Disposition: A | Discharge status: Home / Self Care | Payer: PPO | Source: Ambulatory Visit | Attending: Interventional Radiology | Admitting: Interventional Radiology

## 2023-03-22 DIAGNOSIS — I739 Peripheral vascular disease, unspecified: Secondary | ICD-10-CM

## 2023-03-22 HISTORY — PX: IR RADIOLOGIST EVAL & MGMT: IMG5224

## 2023-03-22 NOTE — Progress Notes (Signed)
Chief Complaint: Claudication   Referring Physician(s): Dr. Benjamin Stain   History of Present Illness: Michael Thomas is a 77 y.o. male presenting as a scheduled follow up to [facility] today, with history of bilateral symptomatic PAD.    Michael Thomas joins [place] today with his wife in the clinic.    History: We first met Michael Thomas 02/01/2018, for complaints of left sided claudication.  He had an opinion closer to his home in [place], but sought second opinion with ongoing symptoms after MRA demonstrated femoral-popliteal etiology.    Treatment history has been: 02/20/18: Left SFA directional atherectomy and DEB 05/14/2019: Right SFA directional atherectomy and DEB 05/07/20: Left SFA laser atherectomy and DEB for recurrent stenosis 02/15/21: Right SFA directional atherectomy and DEB for recurrent stenosis 12/14/2021: Right SFA PTA/Stenting of recurrent, symptomatic, multi-focal SFA stenosis, with supera.  10/13/22: Left SFA  PTA/stenting of adductor stenosis with supera   03/23/22 ABI/Non-invasive: Rigtht ABI: 1.05 Left ABI: 0.99. post exercise: 0.35.   10/01/22 ABI/Non-invasive Right ABI: 1.0 Left ABI: 0.81    Interval:   We last saw Michael Thomas 11/25/22 as a post visit after treatment of his left LE.   He called our office recently with worsening of now his right leg, with short distance claudication at, he tells me, "about 50 feet".  He is unable to walk on the treadmill beyond just a low pace, and cannot tolerate any incline.  The non-invasive from 12/15/22 after recent left treatment: Right ABI: 0.93 Right post-exercise ABI: 0.24 Left ABI:1.11 Left post-exercise ABI: 0.58  His non-invasive 03/13/23: Right ABI: 0.74 Right post-exercise ABI: 0.24 Left ABI: 1.0 Left post-exercise ABI: 1.02  The study from May shows multiphasic doppler waveform of the right LE, but now in August, this is monophasic.  The difference in post exercise left ABI from May to August I cannot explain,  perhaps artifact.   He denies any left sided symptoms at this time.  All symptoms are right.   He continues on maximal medical therapy  He denies chest pain, denies SOB, denies any stroke symptoms.    He has no wounds.    Past Medical History:  Diagnosis Date   Benign prostatic hyperplasia with urinary obstruction 03/25/2015   Diabetes mellitus without complication (HCC)    Hyperlipidemia    Kidney stone    OSA (obstructive sleep apnea)    Vitamin D deficiency     Past Surgical History:  Procedure Laterality Date   DG ARTHRO THUMB*L*     IR ANGIOGRAM EXTREMITY BILATERAL  02/20/2018   IR ANGIOGRAM EXTREMITY BILATERAL  04/05/2019   IR ANGIOGRAM EXTREMITY LEFT  05/07/2020   IR ANGIOGRAM EXTREMITY LEFT  10/13/2022   IR ANGIOGRAM EXTREMITY RIGHT  02/15/2021   IR ANGIOGRAM EXTREMITY RIGHT  12/14/2021   IR ANGIOGRAM SELECTIVE EACH ADDITIONAL VESSEL  02/20/2018   IR ANGIOGRAM SELECTIVE EACH ADDITIONAL VESSEL  10/13/2022   IR FEM POP ART ATHERECT INC PTA MOD SED  02/20/2018   IR FEM POP ART ATHERECT INC PTA MOD SED  05/14/2019   IR FEM POP ART ATHERECT INC PTA MOD SED  05/07/2020   IR FEM POP ART ATHERECT INC PTA MOD SED  02/15/2021   IR FEM POP ART STENT INC PTA MOD SED  12/14/2021   IR ILIAC ART STENT INC PTA MOD SED  10/13/2022   IR INTRAVASCULAR ULTRASOUND NON CORONARY  02/15/2021   IR INTRAVASCULAR ULTRASOUND NON CORONARY  12/14/2021   IR INTRAVASCULAR ULTRASOUND NON  CORONARY  10/13/2022   IR RADIOLOGIST EVAL & MGMT  02/01/2018   IR RADIOLOGIST EVAL & MGMT  03/20/2018   IR RADIOLOGIST EVAL & MGMT  07/25/2018   IR RADIOLOGIST EVAL & MGMT  03/07/2019   IR RADIOLOGIST EVAL & MGMT  04/23/2019   IR RADIOLOGIST EVAL & MGMT  06/18/2019   IR RADIOLOGIST EVAL & MGMT  04/29/2020   IR RADIOLOGIST EVAL & MGMT  06/04/2020   IR RADIOLOGIST EVAL & MGMT  01/20/2021   IR RADIOLOGIST EVAL & MGMT  04/27/2021   IR RADIOLOGIST EVAL & MGMT  08/12/2021   IR RADIOLOGIST EVAL & MGMT  11/02/2021   IR RADIOLOGIST EVAL & MGMT   11/15/2021   IR RADIOLOGIST EVAL & MGMT  01/26/2022   IR RADIOLOGIST EVAL & MGMT  03/23/2022   IR RADIOLOGIST EVAL & MGMT  09/23/2022   IR RADIOLOGIST EVAL & MGMT  11/25/2022   IR US GUIDE VASC ACCESS LEFT  04/05/2019   IR US GUIDE VASC ACCESS LEFT  02/15/2021   IR US GUIDE VASC ACCESS LEFT  12/14/2021   IR US GUIDE VASC ACCESS RIGHT  02/20/2018   IR US GUIDE VASC ACCESS RIGHT  05/14/2019   IR US GUIDE VASC ACCESS RIGHT  05/07/2020   IR US GUIDE VASC ACCESS RIGHT  10/13/2022   NECK SURGERY     VASECTOMY      Allergies: Amoxicillin  Medications: Prior to Admission medications   Medication Sig Start Date End Date Taking? Authorizing Provider  acetaminophen (TYLENOL) 650 MG CR tablet Take 1 tablet (650 mg total) by mouth every 8 (eight) hours as needed for pain. 05/02/22   Monica Becton, MD  AMBULATORY NON FORMULARY MEDICATION One Touch Ultra 2 glucometer 05/15/20   Monica Becton, MD  aspirin EC 81 MG tablet Take 81 mg by mouth at bedtime.    [provider]  Blood Glucose Monitoring Suppl (ONETOUCH VERIO REFLECT) w/Device KIT Check fasting blood sugar every morning and 2 hours after largest meal of the day., 11/22/21   Monica Becton, MD  clobetasol ointment (TEMOVATE) 0.05 % APPLY TOPICALLY TWO TIMES A DAY AS NEEDED 03/09/23   Monica Becton, MD  fenofibrate 160 MG tablet Take 1 tablet (160 mg total) by mouth daily. 11/28/22   Monica Becton, MD  fluticasone (FLONASE) 50 MCG/ACT nasal spray Place 1 spray into both nostrils daily as needed for allergies or rhinitis.    [provider]  glipiZIDE (GLUCOTROL XL) 10 MG 24 hr tablet TAKE 1 TABLET BY MOUTH EVERY DAY WITH BREAKFAST 02/06/23   Monica Becton, MD  JANUVIA 100 MG tablet TAKE 1 TABLET BY MOUTH EVERYDAY AT BEDTIME 02/24/23   Monica Becton, MD  Lancets (ONETOUCH DELICA PLUS LANCET33G) MISC USE UP TO 3 TIMES DAILY. 05/05/22   Monica Becton, MD   lisinopril-hydrochlorothiazide (ZESTORETIC) 20-25 MG tablet Take 1 tablet by mouth daily. 02/16/22   Monica Becton, MD  magnesium oxide (MAG-OX) 400 MG tablet Take 2 tablets (800 mg total) by mouth at bedtime. Patient taking differently: Take 400 mg by mouth as needed (cramps). 08/18/21   Monica Becton, MD  metFORMIN (GLUCOPHAGE) 1000 MG tablet TAKE 1 TABLET (1,000 MG TOTAL) BY MOUTH TWICE A DAY WITH FOOD 01/23/23   Monica Becton, MD  omega-3 acid ethyl esters (LOVAZA) 1 g capsule Take 2 capsules (2 g total) by mouth 2 (two) times daily. 02/16/22   Monica Becton, MD  ONETOUCH VERIO test strip CHECK FASTING BLOOD SUGAR EVERY MORNING AND 2 HOURS AFTER LARGEST MEAL OF THE DAY., 01/31/23   Monica Becton, MD  simvastatin (ZOCOR) 40 MG tablet Take 1 tablet (40 mg total) by mouth daily at 6 PM. Patient taking differently: Take 40 mg by mouth at bedtime. 03/29/22   Monica Becton, MD  solifenacin (VESICARE) 5 MG tablet Take 1 tablet (5 mg total) by mouth daily. 01/11/23   Monica Becton, MD  tamsulosin (FLOMAX) 0.4 MG CAPS capsule Take 1 capsule (0.4 mg total) by mouth 2 (two) times daily. 04/12/22   Monica Becton, MD     Family History  Problem Relation Age of Onset   Stroke Mother    Diabetes Mellitus II Mother    Prostate cancer Neg Hx    Kidney cancer Neg Hx    Bladder Cancer Neg Hx     Social History   Socioeconomic History   Marital status: Married    Spouse name: Not on file   Number of children: Not on file   Years of education: Not on file   Highest education level: Not on file  Occupational History   Not on file  Tobacco Use   Smoking status: Never   Smokeless tobacco: Never  Vaping Use   Vaping status: Never Used  Substance and Sexual Activity   Alcohol use: No   Drug use: No   Sexual activity: Yes    Birth control/protection: None  Other Topics Concern   Not on file  Social History Narrative   Not on file    Social Determinants of Health   Financial Resource Strain: Not on file  Food Insecurity: No Food Insecurity (04/23/2022)   Hunger Vital Sign    Worried About Running Out of Food in the Last Year: Never true    Ran Out of Food in the Last Year: Never true  Transportation Needs: No Transportation Needs (04/23/2022)   PRAPARE - Administrator, Civil Service (Medical): No    Lack of Transportation (Non-Medical): No  Physical Activity: Not on file  Stress: Not on file  Social Connections: Not on file      Review of Systems: A 12 point ROS discussed and pertinent positives are indicated in the HPI above.  All other systems are negative.  Review of Systems  Vital Signs: BP (!) 141/68   Pulse (!) 55   Temp (!) 97.5 F (36.4 C)   Resp 16   SpO2 98%   Advance Care Plan: The advanced care plan/surrogate decision maker was discussed at the time of visit and documented in the medical record.    Physical Exam   General: 77 yo male appearing stated age.  Well-developed, well-nourished.  No distress. HEENT: Atraumatic, normocephalic.  Conjugate gaze, extra-ocular motor intact. No scleral icterus or scleral injection. No lesions on external ears, nose, lips, or gums.  Oral mucosa moist, pink.  Neck: Symmetric with no goiter enlargement.  Chest/Lungs:  Symmetric chest with inspiration/expiration.  No labored breathing.   CTA bilateral.  Heart:    No JVD appreciated. RRR.  No third heart sounds.   Genito-urinary: Deferred Neurologic: Alert & Oriented to person, place, and time.   Normal affect and insight.  Appropriate questions.  Moving all 4 extremities with gross sensory intact.  Pulse Exam:  Palpable right CFA pulse.  He has clear high pitch bruit at the right CFA. Doppler positive bilateral DP and PT.  Extremities:  No wound.   Mallampati Score:     Imaging: US ARTERIAL SEGMENTAL EXERCISE (USE FOR CLAUDICATION)  Result Date: 03/13/2023 CLINICAL DATA:  77 year old male  with a history peripheral arterial disease, recurrent right-sided claudication EXAM: NONINVASIVE PHYSIOLOGIC VASCULAR STUDY OF BILATERAL LOWER EXTREMITIES TECHNIQUE: Evaluation of both lower extremities was performed at rest, including calculation of ankle-brachial indices, multiple segmental pressure evaluation, segmental Doppler and segmental pulse volume recording. COMPARISON:  12/15/2022 FINDINGS: Right: Resting ankle brachial index:  0.74 Post exercise ABI: 0.24 Segmental blood pressure: Upper extremity pressures are symmetric. Doppler: Segmental Doppler of the right lower extremity demonstrates monophasic waveforms throughout Pulse volume recording: Segmental PVR maintained amplitude in the high thigh, with decreasing quality in the calf and ankle segment. Left: Resting ankle brachial index: 1.0 Post exercise ABI: 1.02 Segmental blood pressure: Upper extremity pressure symmetric. Asymmetric calf pressures and ankle pressures, greater on the left. Doppler: Segmental Doppler demonstrates multiphasic waveforms throughout. Pulse volume recording: Segmental PVR on the left maintain amplitude quality and augmentation. Additional: IMPRESSION: Right: Resting ABI in the mild range arterial occlusive disease, decreasing to the severe range after exercise. Segmental exam of the right lower extremity demonstrates iliac or common femoral arterial disease. Left: Resting and post exercise ABI within normal limits. Segmental exam of the left lower extremity demonstrates no significant arterial occlusive disease. Signed, Yvone Neu. Miachel Roux, RPVI [facility] Radiology Electronically Signed   By: Gilmer Mor D.O.   On: 03/13/2023 16:25    Labs:  CBC: Recent Labs    04/23/22 0551 05/02/22 0000 09/06/22 0919 10/13/22 1021  WBC 6.6 6.0 5.6 5.1  HGB 11.8* 12.3* 12.0* 11.5*  HCT 35.8* 36.9* 36.6* 34.2*  PLT 256 353 354 354    COAGS: Recent Labs     10/13/22 1021  INR 1.0    BMP: Recent Labs    04/21/22 1715 04/22/22 0925 04/23/22 0551 05/02/22 0000 09/06/22 0919 10/13/22 1021  NA 138 139 138 134* 140 134*  K 4.5 4.1 4.3 5.0 5.0 4.7  CL 102 107 108 102 107 104  CO2 25 25 22 23 25 24   GLUCOSE 356* 252* 190* 296* 124* 186*  BUN 31* 23 22 36* 36* 43*  CALCIUM 9.8 8.7* 9.2 9.7 9.7 9.8  CREATININE 1.58* 1.49* 1.56* 1.74* 1.62* 1.94*  GFRNONAA 44* 48* 45*  --   --  35*    LIVER FUNCTION TESTS: Recent Labs    04/21/22 2156 05/02/22 0000 09/06/22 0919  BILITOT 0.1* 0.3 0.3  AST 25 14 14   ALT 26 17 14   ALKPHOS 34*  --   --   PROT 7.8 7.0 7.3  ALBUMIN 4.3  --   --     TUMOR MARKERS: No results for input(s): "AFPTM", "CEA", "CA199", "CHROMGRNA" in the last 8760 hours.  Assessment and Plan:  Assessment:  Michael Thomas is a very pleasant 77yo male with history of PAD, having been treated multiple times for bilateral lower extremity short-distance claudication, most recently the left 10/13/22.   He has again had recurrent symptoms, this time of the right leg, compatible with life-style limiting Rutherford 3 class symptoms.   Based on a fairly normal pelvic angio from 12/14/21, fairly normal appearance of the iliac segments on CT 04/22/22, dense right CFA plaque on the CT, his non-invasive from 03/13/23, and the high pitched bruit on exam, I suspect he has focal high grade narrowing of the right CFA.    I did let him  know that while there are contemporary literature that support short term equivalence of PTA possible stent of the CFA with surgical femoral endarterectomy (TECCO trial), these are not proven with the "test of time" and he has good life expectancy.  Also, the durability of femoral endarterectomy is well established, if this, indeed, is what he needs.  He understands.     I let him know that I would set up a referral for him to visit our Vascular Surgery colleagues.   Continue current care   Plan: - We will contact  our colleagues in Vascular Surgery to see if they would be willing to see him to discuss his recurrent right leg symptoms.  - continue maximal medical therapy  Electronically Signed: Gilmer Mor 03/22/2023, 2:54 PM   I spent a total of    40 Minutes in face to face in clinical consultation, greater than 50% of which was counseling/coordinating care for recurrent right side rutherford 3 symptoms, prior therapy for PAD

## 2023-03-27 ENCOUNTER — Other Ambulatory Visit: Payer: Self-pay | Admitting: Sports Medicine

## 2023-04-10 ENCOUNTER — Other Ambulatory Visit: Payer: Self-pay | Admitting: Sports Medicine

## 2023-04-10 DIAGNOSIS — I10 Essential (primary) hypertension: Secondary | ICD-10-CM

## 2023-04-24 NOTE — Progress Notes (Unsigned)
[facility] [place]  ASSESSMENT / PLAN: Michael Thomas is a 77 y.o. male with atherosclerosis of native arteries of right lower extremity causing intermittent claudication.  Recommend the following which can slow the progression of atherosclerosis and reduce the risk of major adverse cardiac / limb events:  Complete cessation from all tobacco products. Blood glucose control with goal A1c < 7%. Blood pressure control with goal blood pressure < 140/90 mmHg. Lipid reduction therapy with goal LDL-C <100 mg/dL (<16 if symptomatic from PAD).  Aspirin 81mg  PO QD.  Atorvastatin 40-80mg  PO QD (or other "high intensity" statin therapy).  Patient cannot tolerate his symptoms in the right lower extremity.  Based on previous imaging, Dr. Loreta Ave and I anticipate right common femoral artery stenosis.  We will check a CT angiogram to confirm this.  CHIEF COMPLAINT: Claudication  HISTORY OF PRESENT ILLNESS: Michael Thomas is a 77 y.o. male referred from Dr. Loreta Ave for discussion of surgical options for intermittent claudication in bilateral lower extremities.  The patient is undergone multiple endovascular interventions for lifestyle limiting claudication by Dr. Loreta Ave.  He reports good symptom relief in the short-term after these interventions, but gradual return to baseline level of discomfort afterwards.  He has a good amount of exercise tolerance from my perspective.  He reports he can walk on a flat surface for greater than a mile.  He has difficulty on incline surfaces.  He has difficulty jogging.  04/25/23: Patient returns to clinic for evaluation.  He describes fairly classic claudication symptoms in his right calf which are bothersome to him.  These began at about 50 yards.  This is interfering with his lifestyle.  He strongly desires intervention.  Past Medical History:  Diagnosis Date   Benign prostatic hyperplasia with urinary obstruction 03/25/2015   Diabetes  mellitus without complication (HCC)    Hyperlipidemia    Kidney stone    OSA (obstructive sleep apnea)    Vitamin D deficiency     Past Surgical History:  Procedure Laterality Date   DG ARTHRO THUMB*L*     IR ANGIOGRAM EXTREMITY BILATERAL  02/20/2018   IR ANGIOGRAM EXTREMITY BILATERAL  04/05/2019   IR ANGIOGRAM EXTREMITY LEFT  05/07/2020   IR ANGIOGRAM EXTREMITY LEFT  10/13/2022   IR ANGIOGRAM EXTREMITY RIGHT  02/15/2021   IR ANGIOGRAM EXTREMITY RIGHT  12/14/2021   IR ANGIOGRAM SELECTIVE EACH ADDITIONAL VESSEL  02/20/2018   IR ANGIOGRAM SELECTIVE EACH ADDITIONAL VESSEL  10/13/2022   IR FEM POP ART ATHERECT INC PTA MOD SED  02/20/2018   IR FEM POP ART ATHERECT INC PTA MOD SED  05/14/2019   IR FEM POP ART ATHERECT INC PTA MOD SED  05/07/2020   IR FEM POP ART ATHERECT INC PTA MOD SED  02/15/2021   IR FEM POP ART STENT INC PTA MOD SED  12/14/2021   IR ILIAC ART STENT INC PTA MOD SED  10/13/2022   IR INTRAVASCULAR ULTRASOUND NON CORONARY  02/15/2021   IR INTRAVASCULAR ULTRASOUND NON CORONARY  12/14/2021   IR INTRAVASCULAR ULTRASOUND NON CORONARY  10/13/2022   IR RADIOLOGIST EVAL & MGMT  02/01/2018   IR RADIOLOGIST EVAL & MGMT  03/20/2018   IR RADIOLOGIST EVAL & MGMT  07/25/2018   IR RADIOLOGIST EVAL & MGMT  03/07/2019   IR RADIOLOGIST EVAL & MGMT  04/23/2019   IR RADIOLOGIST EVAL & MGMT  06/18/2019   IR RADIOLOGIST EVAL & MGMT  04/29/2020   IR RADIOLOGIST EVAL & MGMT  06/04/2020   IR RADIOLOGIST EVAL & MGMT  01/20/2021   IR RADIOLOGIST EVAL & MGMT  04/27/2021   IR RADIOLOGIST EVAL & MGMT  08/12/2021   IR RADIOLOGIST EVAL & MGMT  11/02/2021   IR RADIOLOGIST EVAL & MGMT  11/15/2021   IR RADIOLOGIST EVAL & MGMT  01/26/2022   IR RADIOLOGIST EVAL & MGMT  03/23/2022   IR RADIOLOGIST EVAL & MGMT  09/23/2022   IR RADIOLOGIST EVAL & MGMT  11/25/2022   IR RADIOLOGIST EVAL & MGMT  03/22/2023   IR US GUIDE VASC ACCESS LEFT  04/05/2019   IR US GUIDE VASC ACCESS LEFT  02/15/2021   IR US GUIDE VASC ACCESS LEFT  12/14/2021   IR US  GUIDE VASC ACCESS RIGHT  02/20/2018   IR US GUIDE VASC ACCESS RIGHT  05/14/2019   IR US GUIDE VASC ACCESS RIGHT  05/07/2020   IR US GUIDE VASC ACCESS RIGHT  10/13/2022   NECK SURGERY     VASECTOMY      Family History  Problem Relation Age of Onset   Stroke Mother    Diabetes Mellitus II Mother    Prostate cancer Neg Hx    Kidney cancer Neg Hx    Bladder Cancer Neg Hx     Social History   Socioeconomic History   Marital status: Married    Spouse name: Not on file   Number of children: Not on file   Years of education: Not on file   Highest education level: Not on file  Occupational History   Not on file  Tobacco Use   Smoking status: Never   Smokeless tobacco: Never  Vaping Use   Vaping status: Never Used  Substance and Sexual Activity   Alcohol use: No   Drug use: No   Sexual activity: Yes    Birth control/protection: None  Other Topics Concern   Not on file  Social History Narrative   Not on file   Social Determinants of Health   Financial Resource Strain: Not on file  Food Insecurity: No Food Insecurity (04/23/2022)   Hunger Vital Sign    Worried About Running Out of Food in the Last Year: Never true    Ran Out of Food in the Last Year: Never true  Transportation Needs: No Transportation Needs (04/23/2022)   PRAPARE - Administrator, Civil Service (Medical): No    Lack of Transportation (Non-Medical): No  Physical Activity: Not on file  Stress: Not on file  Social Connections: Not on file  Intimate Partner Violence: Not At Risk (04/23/2022)   Humiliation, Afraid, Rape, and Kick questionnaire    Fear of Current or Ex-Partner: No    Emotionally Abused: No    Physically Abused: No    Sexually Abused: No    Allergies  Allergen Reactions   Amoxicillin Other (See Comments)    Fatigue and Myalgia and Rigors x2 attempts    Current Outpatient Medications  Medication Sig Dispense Refill   acetaminophen (TYLENOL) 650 MG CR tablet Take 1 tablet (650 mg  total) by mouth every 8 (eight) hours as needed for pain. 90 tablet 3   AMBULATORY NON FORMULARY MEDICATION One Touch Ultra 2 glucometer 1 each 0   aspirin EC 81 MG tablet Take 81 mg by mouth at bedtime.     Blood Glucose Monitoring Suppl (ONETOUCH VERIO REFLECT) w/Device KIT Check fasting blood sugar every morning and 2 hours after largest meal of the day., 1 kit 0  clobetasol ointment (TEMOVATE) 0.05 % APPLY TOPICALLY TWO TIMES A DAY AS NEEDED 45 g 2   fenofibrate 160 MG tablet Take 1 tablet (160 mg total) by mouth daily. 90 tablet 3   fluticasone (FLONASE) 50 MCG/ACT nasal spray Place 1 spray into both nostrils daily as needed for allergies or rhinitis.     glipiZIDE (GLUCOTROL XL) 10 MG 24 hr tablet TAKE 1 TABLET BY MOUTH EVERY DAY WITH BREAKFAST 90 tablet 3   JANUVIA 100 MG tablet TAKE 1 TABLET BY MOUTH EVERYDAY AT BEDTIME 90 tablet 1   Lancets (ONETOUCH DELICA PLUS LANCET33G) MISC USE UP TO 3 TIMES DAILY. 100 each 11   lisinopril-hydrochlorothiazide (ZESTORETIC) 20-25 MG tablet TAKE 1 TABLET DAILY. 90 tablet 3   magnesium oxide (MAG-OX) 400 MG tablet Take 2 tablets (800 mg total) by mouth at bedtime. (Patient taking differently: Take 400 mg by mouth as needed (cramps).) 180 tablet 3   metFORMIN (GLUCOPHAGE) 1000 MG tablet TAKE 1 TABLET (1,000 MG TOTAL) BY MOUTH TWICE A DAY WITH FOOD 180 tablet 1   omega-3 acid ethyl esters (LOVAZA) 1 g capsule TAKE 2 CAPSULES BY MOUTH TWICE A DAY 360 capsule 3   ONETOUCH VERIO test strip CHECK FASTING BLOOD SUGAR EVERY MORNING AND 2 HOURS AFTER LARGEST MEAL OF THE DAY., 100 strip 12   simvastatin (ZOCOR) 40 MG tablet TAKE 1 TABLET BY MOUTH EVERYDAY AT BEDTIME 90 tablet 0   solifenacin (VESICARE) 5 MG tablet Take 1 tablet (5 mg total) by mouth daily.     tamsulosin (FLOMAX) 0.4 MG CAPS capsule Take 1 capsule (0.4 mg total) by mouth 2 (two) times daily. 180 capsule 3   No current facility-administered medications for this visit.    PHYSICAL EXAM Vitals:    04/25/23 1321  BP: 136/76  Pulse: 60  Resp: 20  Temp: 97.7 F (36.5 C)  SpO2: 99%  Weight: 133 lb (60.3 kg)  Height: 5\' 1"  (1.549 m)     Constitutional: well appearing. no distress. Appears well nourished.  Neurologic: CN intact. no focal findings. no sensory loss. Psychiatric:  Mood and affect symmetric and appropriate. Eyes:  No icterus. No conjunctival pallor. Ears, nose, throat:  mucous membranes moist. Midline trachea.  Cardiac: regular rate and rhythm.  Respiratory:  unlabored. Abdominal:  soft, non-tender, non-distended.  Peripheral vascular: 2+ L DP / PT pulse. Absent R DP / PT pulse Extremity: no edema. no cyanosis. no pallor.  Skin: no gangrene. no ulceration.  Lymphatic: no Stemmer's sign. no palpable lymphadenopathy.  PERTINENT LABORATORY AND RADIOLOGIC DATA  Most recent CBC    Latest Ref Rng & Units 10/13/2022   10:21 AM 09/06/2022    9:19 AM 05/02/2022   12:00 AM  CBC  WBC 4.0 - 10.5 K/uL 5.1  5.6  6.0   Hemoglobin 13.0 - 17.0 g/dL 16.1  09.6  04.5   Hematocrit 39.0 - 52.0 % 34.2  36.6  36.9   Platelets 150 - 400 K/uL 354  354  353      Most recent CMP    Latest Ref Rng & Units 10/13/2022   10:21 AM 09/06/2022    9:19 AM 05/02/2022   12:00 AM  CMP  Glucose 70 - 99 mg/dL 409  811  914   BUN 8 - 23 mg/dL 43  36  36   Creatinine 0.61 - 1.24 mg/dL 7.82  9.56  2.13   Sodium 135 - 145 mmol/L 134  140  134   Potassium  3.5 - 5.1 mmol/L 4.7  5.0  5.0   Chloride 98 - 111 mmol/L 104  107  102   CO2 22 - 32 mmol/L 24  25  23    Calcium 8.9 - 10.3 mg/dL 9.8  9.7  9.7   Total Protein 6.1 - 8.1 g/dL  7.3  7.0   Total Bilirubin 0.2 - 1.2 mg/dL  0.3  0.3   AST 10 - 35 U/L  14  14   ALT 9 - 46 U/L  14  17     Renal function CrCl cannot be calculated (Patient's most recent lab result is older than the maximum 21 days allowed.).  Hemoglobin A1C (%)  Date Value  11/28/2022 7.0 (A)   HbA1c, POC (controlled diabetic range) (%)  Date Value  02/16/2022 7.7 (A)    Hgb A1c MFr Bld (% of total Hgb)  Date Value  09/06/2022 8.1 (H)    LDL Cholesterol (Calc)  Date Value Ref Range Status  09/06/2022 81 mg/dL (calc) Final    Comment:    Reference range: <100 . Desirable range <100 mg/dL for primary prevention;   <70 mg/dL for patients with CHD or diabetic patients  with > or = 2 CHD risk factors. Marland Kitchen LDL-C is now calculated using the Martin-Hopkins  calculation, which is a validated novel method providing  better accuracy than the Friedewald equation in the  estimation of LDL-C.  Horald Pollen et al. Lenox Ahr. 6962;952(84): 2061-2068  (http://education.[facility].com/faq/FAQ164)    Direct LDL  Date Value Ref Range Status  07/25/2018 58 <100 mg/dL Final    Comment:    . Desirable range <100 mg/dL for primary prevention;   <70 mg/dL for patients with CHD or diabetic patients  with > or = 2 CHD risk factors. .     Prior right lower extremity angiogram from 12/14/21 reviewed CFA stenosis noted. Good technical result from SFA intervention.  Rande Brunt. Lenell Antu, MD [facility] Phone Number: [phone] 04/25/2023 2:57 PM  Total time spent on preparing this encounter including chart review, data review, collecting history, examining the patient, coordinating care for this established patient, 30 minutes.   Portions of this report may have been transcribed using voice recognition software.  Every effort has been made to ensure accuracy; however, inadvertent computerized transcription errors may still be present.

## 2023-04-25 ENCOUNTER — Ambulatory Visit: Payer: PPO | Admitting: Vascular Surgery

## 2023-04-25 ENCOUNTER — Other Ambulatory Visit: Payer: Self-pay | Admitting: Sports Medicine

## 2023-04-25 ENCOUNTER — Encounter: Payer: Self-pay | Admitting: Vascular Surgery

## 2023-04-25 VITALS — BP 136/76 | HR 60 | Temp 97.7°F | Resp 20 | Ht 61.0 in | Wt 133.0 lb

## 2023-04-25 DIAGNOSIS — I739 Peripheral vascular disease, unspecified: Secondary | ICD-10-CM | POA: Diagnosis not present

## 2023-04-25 DIAGNOSIS — E785 Hyperlipidemia, unspecified: Secondary | ICD-10-CM

## 2023-04-26 ENCOUNTER — Other Ambulatory Visit: Payer: Self-pay

## 2023-04-26 DIAGNOSIS — I739 Peripheral vascular disease, unspecified: Secondary | ICD-10-CM

## 2023-05-01 ENCOUNTER — Other Ambulatory Visit: Payer: Self-pay | Admitting: Sports Medicine

## 2023-05-01 DIAGNOSIS — N138 Other obstructive and reflux uropathy: Secondary | ICD-10-CM

## 2023-05-11 ENCOUNTER — Other Ambulatory Visit: Payer: PPO

## 2023-05-12 ENCOUNTER — Telehealth: Payer: Self-pay

## 2023-05-12 ENCOUNTER — Ambulatory Visit
Admission: RE | Admit: 2023-05-12 | Discharge: 2023-05-12 | Disposition: A | Discharge status: Home / Self Care | Payer: PPO | Source: Ambulatory Visit | Attending: Vascular Surgery

## 2023-05-12 ENCOUNTER — Other Ambulatory Visit: Payer: PPO

## 2023-05-12 DIAGNOSIS — I739 Peripheral vascular disease, unspecified: Secondary | ICD-10-CM

## 2023-05-12 DIAGNOSIS — I70219 Atherosclerosis of native arteries of extremities with intermittent claudication, unspecified extremity: Secondary | ICD-10-CM

## 2023-05-12 NOTE — Telephone Encounter (Signed)
-----   Message from [name] sent at 05/12/2023 11:27 AM EDT ----- Regarding: RE: Please Advise OK - let's get a aortoiliac duplex and LLE duplex when he comes back to see me.  Thanks Tom ----- Message ----- From: Hurshel Keys, RN Sent: 05/12/2023  11:25 AM EDT To: [name], MD Subject: Please Advise                                  Dr. Lenell Antu,  This pt's Creat is 2.4 and GFR is 27, so no way he can get the CTA aorto bi-fem w/ run-off. Please advise. His f/u w/ you is 10/8.  Thanks, Lawanna Kobus, RN - Triage

## 2023-05-12 NOTE — Addendum Note (Signed)
Addended by: Leilani Able, Kimbely Whiteaker A on: 05/12/2023 03:50 PM   Modules accepted: Orders

## 2023-05-12 NOTE — Telephone Encounter (Addendum)
Called pt to schedule appt, no answer, lf vm.  Pt returned call.  Reviewed pt's chart, returned call for clarification, two identifiers used. Informed him that a msg was sent to Dr. Lenell Antu who advised that pt have some US done. Worked with Alcario Drought, RVS, to make appts for pt. Appts sch. Fasting instructions reviewed. Confirmed understanding.

## 2023-05-15 NOTE — H&P (View-Only) (Signed)
[facility] [place]  ASSESSMENT / PLAN: Michael Thomas is a 77 y.o. male with atherosclerosis of native arteries of right lower extremity causing intermittent claudication.  Recommend the following which can slow the progression of atherosclerosis and reduce the risk of major adverse cardiac / limb events:  Complete cessation from all tobacco products. Blood glucose control with goal A1c < 7%. Blood pressure control with goal blood pressure < 140/90 mmHg. Lipid reduction therapy with goal LDL-C <100 mg/dL (<62 if symptomatic from PAD).  Aspirin 81mg  PO QD.  Atorvastatin 40-80mg  PO QD (or other "high intensity" statin therapy).  Patient cannot tolerate his symptoms in the right lower extremity.  Based on previous imaging, Dr. Loreta Ave and I anticipate right common femoral artery stenosis.  We will check a CT angiogram to confirm this.  CHIEF COMPLAINT: Claudication  HISTORY OF PRESENT ILLNESS: Michael Thomas is a 77 y.o. male referred from Dr. Loreta Ave for discussion of surgical options for intermittent claudication in bilateral lower extremities.  The patient is undergone multiple endovascular interventions for lifestyle limiting claudication by Dr. Loreta Ave.  He reports good symptom relief in the short-term after these interventions, but gradual return to baseline level of discomfort afterwards.  He has a good amount of exercise tolerance from my perspective.  He reports he can walk on a flat surface for greater than a mile.  He has difficulty on incline surfaces.  He has difficulty jogging.  04/25/23: Patient returns to clinic for evaluation.  He describes fairly classic claudication symptoms in his right calf which are bothersome to him.  These began at about 50 yards.  This is interfering with his lifestyle.  He strongly desires intervention.  Past Medical History:  Diagnosis Date   Benign prostatic hyperplasia with urinary obstruction 03/25/2015   Diabetes  mellitus without complication (HCC)    Hyperlipidemia    Kidney stone    OSA (obstructive sleep apnea)    Vitamin D deficiency     Past Surgical History:  Procedure Laterality Date   DG ARTHRO THUMB*L*     IR ANGIOGRAM EXTREMITY BILATERAL  02/20/2018   IR ANGIOGRAM EXTREMITY BILATERAL  04/05/2019   IR ANGIOGRAM EXTREMITY LEFT  05/07/2020   IR ANGIOGRAM EXTREMITY LEFT  10/13/2022   IR ANGIOGRAM EXTREMITY RIGHT  02/15/2021   IR ANGIOGRAM EXTREMITY RIGHT  12/14/2021   IR ANGIOGRAM SELECTIVE EACH ADDITIONAL VESSEL  02/20/2018   IR ANGIOGRAM SELECTIVE EACH ADDITIONAL VESSEL  10/13/2022   IR FEM POP ART ATHERECT INC PTA MOD SED  02/20/2018   IR FEM POP ART ATHERECT INC PTA MOD SED  05/14/2019   IR FEM POP ART ATHERECT INC PTA MOD SED  05/07/2020   IR FEM POP ART ATHERECT INC PTA MOD SED  02/15/2021   IR FEM POP ART STENT INC PTA MOD SED  12/14/2021   IR ILIAC ART STENT INC PTA MOD SED  10/13/2022   IR INTRAVASCULAR ULTRASOUND NON CORONARY  02/15/2021   IR INTRAVASCULAR ULTRASOUND NON CORONARY  12/14/2021   IR INTRAVASCULAR ULTRASOUND NON CORONARY  10/13/2022   IR RADIOLOGIST EVAL & MGMT  02/01/2018   IR RADIOLOGIST EVAL & MGMT  03/20/2018   IR RADIOLOGIST EVAL & MGMT  07/25/2018   IR RADIOLOGIST EVAL & MGMT  03/07/2019   IR RADIOLOGIST EVAL & MGMT  04/23/2019   IR RADIOLOGIST EVAL & MGMT  06/18/2019   IR RADIOLOGIST EVAL & MGMT  04/29/2020   IR RADIOLOGIST EVAL & MGMT  06/04/2020   IR RADIOLOGIST EVAL & MGMT  01/20/2021   IR RADIOLOGIST EVAL & MGMT  04/27/2021   IR RADIOLOGIST EVAL & MGMT  08/12/2021   IR RADIOLOGIST EVAL & MGMT  11/02/2021   IR RADIOLOGIST EVAL & MGMT  11/15/2021   IR RADIOLOGIST EVAL & MGMT  01/26/2022   IR RADIOLOGIST EVAL & MGMT  03/23/2022   IR RADIOLOGIST EVAL & MGMT  09/23/2022   IR RADIOLOGIST EVAL & MGMT  11/25/2022   IR RADIOLOGIST EVAL & MGMT  03/22/2023   IR US GUIDE VASC ACCESS LEFT  04/05/2019   IR US GUIDE VASC ACCESS LEFT  02/15/2021   IR US GUIDE VASC ACCESS LEFT  12/14/2021   IR US  GUIDE VASC ACCESS RIGHT  02/20/2018   IR US GUIDE VASC ACCESS RIGHT  05/14/2019   IR US GUIDE VASC ACCESS RIGHT  05/07/2020   IR US GUIDE VASC ACCESS RIGHT  10/13/2022   NECK SURGERY     VASECTOMY      Family History  Problem Relation Age of Onset   Stroke Mother    Diabetes Mellitus II Mother    Prostate cancer Neg Hx    Kidney cancer Neg Hx    Bladder Cancer Neg Hx     Social History   Socioeconomic History   Marital status: Married    Spouse name: Not on file   Number of children: Not on file   Years of education: Not on file   Highest education level: Not on file  Occupational History   Not on file  Tobacco Use   Smoking status: Never   Smokeless tobacco: Never  Vaping Use   Vaping status: Never Used  Substance and Sexual Activity   Alcohol use: No   Drug use: No   Sexual activity: Yes    Birth control/protection: None  Other Topics Concern   Not on file  Social History Narrative   Not on file   Social Determinants of Health   Financial Resource Strain: Not on file  Food Insecurity: No Food Insecurity (04/23/2022)   Hunger Vital Sign    Worried About Running Out of Food in the Last Year: Never true    Ran Out of Food in the Last Year: Never true  Transportation Needs: No Transportation Needs (04/23/2022)   PRAPARE - Administrator, Civil Service (Medical): No    Lack of Transportation (Non-Medical): No  Physical Activity: Not on file  Stress: Not on file  Social Connections: Not on file  Intimate Partner Violence: Not At Risk (04/23/2022)   Humiliation, Afraid, Rape, and Kick questionnaire    Fear of Current or Ex-Partner: No    Emotionally Abused: No    Physically Abused: No    Sexually Abused: No    Allergies  Allergen Reactions   Amoxicillin Other (See Comments)    Fatigue and Myalgia and Rigors x2 attempts    Current Outpatient Medications  Medication Sig Dispense Refill   acetaminophen (TYLENOL) 650 MG CR tablet Take 1 tablet (650 mg  total) by mouth every 8 (eight) hours as needed for pain. 90 tablet 3   AMBULATORY NON FORMULARY MEDICATION One Touch Ultra 2 glucometer 1 each 0   aspirin EC 81 MG tablet Take 81 mg by mouth at bedtime.     Blood Glucose Monitoring Suppl (ONETOUCH VERIO REFLECT) w/Device KIT Check fasting blood sugar every morning and 2 hours after largest meal of the day., 1 kit 0  clobetasol ointment (TEMOVATE) 0.05 % APPLY TOPICALLY TWO TIMES A DAY AS NEEDED 45 g 2   fenofibrate 160 MG tablet Take 1 tablet (160 mg total) by mouth daily. 90 tablet 3   fluticasone (FLONASE) 50 MCG/ACT nasal spray Place 1 spray into both nostrils daily as needed for allergies or rhinitis.     glipiZIDE (GLUCOTROL XL) 10 MG 24 hr tablet TAKE 1 TABLET BY MOUTH EVERY DAY WITH BREAKFAST 90 tablet 3   JANUVIA 100 MG tablet TAKE 1 TABLET BY MOUTH EVERYDAY AT BEDTIME 90 tablet 1   Lancets (ONETOUCH DELICA PLUS LANCET33G) MISC USE UP TO 3 TIMES DAILY. 100 each 11   lisinopril-hydrochlorothiazide (ZESTORETIC) 20-25 MG tablet TAKE 1 TABLET DAILY. 90 tablet 3   magnesium oxide (MAG-OX) 400 MG tablet Take 2 tablets (800 mg total) by mouth at bedtime. (Patient taking differently: Take 400 mg by mouth as needed (cramps).) 180 tablet 3   metFORMIN (GLUCOPHAGE) 1000 MG tablet TAKE 1 TABLET (1,000 MG TOTAL) BY MOUTH TWICE A DAY WITH FOOD 180 tablet 1   omega-3 acid ethyl esters (LOVAZA) 1 g capsule TAKE 2 CAPSULES BY MOUTH TWICE A DAY 360 capsule 3   ONETOUCH VERIO test strip CHECK FASTING BLOOD SUGAR EVERY MORNING AND 2 HOURS AFTER LARGEST MEAL OF THE DAY., 100 strip 12   simvastatin (ZOCOR) 40 MG tablet TAKE 1 TABLET BY MOUTH EVERYDAY AT BEDTIME 90 tablet 0   solifenacin (VESICARE) 5 MG tablet Take 1 tablet (5 mg total) by mouth daily.     tamsulosin (FLOMAX) 0.4 MG CAPS capsule TAKE ONE CAPSULE BY MOUTH TWICE A DAY 180 capsule 3   No current facility-administered medications for this visit.    PHYSICAL EXAM There were no vitals filed for  this visit.    Constitutional: well appearing. no distress. Appears well nourished.  Neurologic: CN intact. no focal findings. no sensory loss. Psychiatric:  Mood and affect symmetric and appropriate. Eyes:  No icterus. No conjunctival pallor. Ears, nose, throat:  mucous membranes moist. Midline trachea.  Cardiac: regular rate and rhythm.  Respiratory:  unlabored. Abdominal:  soft, non-tender, non-distended.  Peripheral vascular: 2+ L DP / PT pulse. Absent R DP / PT pulse Extremity: no edema. no cyanosis. no pallor.  Skin: no gangrene. no ulceration.  Lymphatic: no Stemmer's sign. no palpable lymphadenopathy.  PERTINENT LABORATORY AND RADIOLOGIC DATA  Most recent CBC    Latest Ref Rng & Units 10/13/2022   10:21 AM 09/06/2022    9:19 AM 05/02/2022   12:00 AM  CBC  WBC 4.0 - 10.5 K/uL 5.1  5.6  6.0   Hemoglobin 13.0 - 17.0 g/dL 09.8  11.9  14.7   Hematocrit 39.0 - 52.0 % 34.2  36.6  36.9   Platelets 150 - 400 K/uL 354  354  353      Most recent CMP    Latest Ref Rng & Units 10/13/2022   10:21 AM 09/06/2022    9:19 AM 05/02/2022   12:00 AM  CMP  Glucose 70 - 99 mg/dL 829  562  130   BUN 8 - 23 mg/dL 43  36  36   Creatinine 0.61 - 1.24 mg/dL 8.65  7.84  6.96   Sodium 135 - 145 mmol/L 134  140  134   Potassium 3.5 - 5.1 mmol/L 4.7  5.0  5.0   Chloride 98 - 111 mmol/L 104  107  102   CO2 22 - 32 mmol/L 24  25  23   Calcium 8.9 - 10.3 mg/dL 9.8  9.7  9.7   Total Protein 6.1 - 8.1 g/dL  7.3  7.0   Total Bilirubin 0.2 - 1.2 mg/dL  0.3  0.3   AST 10 - 35 U/L  14  14   ALT 9 - 46 U/L  14  17     Renal function CrCl cannot be calculated (Patient's most recent lab result is older than the maximum 21 days allowed.).  Hemoglobin A1C (%)  Date Value  11/28/2022 7.0 (A)   HbA1c, POC (controlled diabetic range) (%)  Date Value  02/16/2022 7.7 (A)   Hgb A1c MFr Bld (% of total Hgb)  Date Value  09/06/2022 8.1 (H)    LDL Cholesterol (Calc)  Date Value Ref Range Status   09/06/2022 81 mg/dL (calc) Final    Comment:    Reference range: <100 . Desirable range <100 mg/dL for primary prevention;   <70 mg/dL for patients with CHD or diabetic patients  with > or = 2 CHD risk factors. Marland Kitchen LDL-C is now calculated using the Martin-Hopkins  calculation, which is a validated novel method providing  better accuracy than the Friedewald equation in the  estimation of LDL-C.  Horald Pollen et al. Lenox Ahr. 0981;191(47): 2061-2068  (http://education.[facility].com/faq/FAQ164)    Direct LDL  Date Value Ref Range Status  07/25/2018 58 <100 mg/dL Final    Comment:    . Desirable range <100 mg/dL for primary prevention;   <70 mg/dL for patients with CHD or diabetic patients  with > or = 2 CHD risk factors. .     Prior right lower extremity angiogram from 12/14/21 reviewed CFA stenosis noted. Good technical result from SFA intervention.  Rande Brunt. Lenell Antu, MD [facility] Phone Number: [phone] 05/15/2023 11:00 AM  Total time spent on preparing this encounter including chart review, data review, collecting history, examining the patient, coordinating care for this established patient, 30 minutes.   Portions of this report may have been transcribed using voice recognition software.  Every effort has been made to ensure accuracy; however, inadvertent computerized transcription errors may still be present.

## 2023-05-15 NOTE — Progress Notes (Unsigned)
[facility] [place]  ASSESSMENT / PLAN: Michael Thomas is a 77 y.o. male with atherosclerosis of native arteries of right lower extremity causing intermittent claudication.  Recommend the following which can slow the progression of atherosclerosis and reduce the risk of major adverse cardiac / limb events:  Complete cessation from all tobacco products. Blood glucose control with goal A1c < 7%. Blood pressure control with goal blood pressure < 140/90 mmHg. Lipid reduction therapy with goal LDL-C <100 mg/dL (<62 if symptomatic from PAD).  Aspirin 81mg  PO QD.  Atorvastatin 40-80mg  PO QD (or other "high intensity" statin therapy).  Patient cannot tolerate his symptoms in the right lower extremity.  Based on previous imaging, Dr. Loreta Ave and I anticipate right common femoral artery stenosis.  We will check a CT angiogram to confirm this.  CHIEF COMPLAINT: Claudication  HISTORY OF PRESENT ILLNESS: Michael Thomas is a 77 y.o. male referred from Dr. Loreta Ave for discussion of surgical options for intermittent claudication in bilateral lower extremities.  The patient is undergone multiple endovascular interventions for lifestyle limiting claudication by Dr. Loreta Ave.  He reports good symptom relief in the short-term after these interventions, but gradual return to baseline level of discomfort afterwards.  He has a good amount of exercise tolerance from my perspective.  He reports he can walk on a flat surface for greater than a mile.  He has difficulty on incline surfaces.  He has difficulty jogging.  04/25/23: Patient returns to clinic for evaluation.  He describes fairly classic claudication symptoms in his right calf which are bothersome to him.  These began at about 50 yards.  This is interfering with his lifestyle.  He strongly desires intervention.  Past Medical History:  Diagnosis Date   Benign prostatic hyperplasia with urinary obstruction 03/25/2015   Diabetes  mellitus without complication (HCC)    Hyperlipidemia    Kidney stone    OSA (obstructive sleep apnea)    Vitamin D deficiency     Past Surgical History:  Procedure Laterality Date   DG ARTHRO THUMB*L*     IR ANGIOGRAM EXTREMITY BILATERAL  02/20/2018   IR ANGIOGRAM EXTREMITY BILATERAL  04/05/2019   IR ANGIOGRAM EXTREMITY LEFT  05/07/2020   IR ANGIOGRAM EXTREMITY LEFT  10/13/2022   IR ANGIOGRAM EXTREMITY RIGHT  02/15/2021   IR ANGIOGRAM EXTREMITY RIGHT  12/14/2021   IR ANGIOGRAM SELECTIVE EACH ADDITIONAL VESSEL  02/20/2018   IR ANGIOGRAM SELECTIVE EACH ADDITIONAL VESSEL  10/13/2022   IR FEM POP ART ATHERECT INC PTA MOD SED  02/20/2018   IR FEM POP ART ATHERECT INC PTA MOD SED  05/14/2019   IR FEM POP ART ATHERECT INC PTA MOD SED  05/07/2020   IR FEM POP ART ATHERECT INC PTA MOD SED  02/15/2021   IR FEM POP ART STENT INC PTA MOD SED  12/14/2021   IR ILIAC ART STENT INC PTA MOD SED  10/13/2022   IR INTRAVASCULAR ULTRASOUND NON CORONARY  02/15/2021   IR INTRAVASCULAR ULTRASOUND NON CORONARY  12/14/2021   IR INTRAVASCULAR ULTRASOUND NON CORONARY  10/13/2022   IR RADIOLOGIST EVAL & MGMT  02/01/2018   IR RADIOLOGIST EVAL & MGMT  03/20/2018   IR RADIOLOGIST EVAL & MGMT  07/25/2018   IR RADIOLOGIST EVAL & MGMT  03/07/2019   IR RADIOLOGIST EVAL & MGMT  04/23/2019   IR RADIOLOGIST EVAL & MGMT  06/18/2019   IR RADIOLOGIST EVAL & MGMT  04/29/2020   IR RADIOLOGIST EVAL & MGMT  06/04/2020   IR RADIOLOGIST EVAL & MGMT  01/20/2021   IR RADIOLOGIST EVAL & MGMT  04/27/2021   IR RADIOLOGIST EVAL & MGMT  08/12/2021   IR RADIOLOGIST EVAL & MGMT  11/02/2021   IR RADIOLOGIST EVAL & MGMT  11/15/2021   IR RADIOLOGIST EVAL & MGMT  01/26/2022   IR RADIOLOGIST EVAL & MGMT  03/23/2022   IR RADIOLOGIST EVAL & MGMT  09/23/2022   IR RADIOLOGIST EVAL & MGMT  11/25/2022   IR RADIOLOGIST EVAL & MGMT  03/22/2023   IR US GUIDE VASC ACCESS LEFT  04/05/2019   IR US GUIDE VASC ACCESS LEFT  02/15/2021   IR US GUIDE VASC ACCESS LEFT  12/14/2021   IR US  GUIDE VASC ACCESS RIGHT  02/20/2018   IR US GUIDE VASC ACCESS RIGHT  05/14/2019   IR US GUIDE VASC ACCESS RIGHT  05/07/2020   IR US GUIDE VASC ACCESS RIGHT  10/13/2022   NECK SURGERY     VASECTOMY      Family History  Problem Relation Age of Onset   Stroke Mother    Diabetes Mellitus II Mother    Prostate cancer Neg Hx    Kidney cancer Neg Hx    Bladder Cancer Neg Hx     Social History   Socioeconomic History   Marital status: Married    Spouse name: Not on file   Number of children: Not on file   Years of education: Not on file   Highest education level: Not on file  Occupational History   Not on file  Tobacco Use   Smoking status: Never   Smokeless tobacco: Never  Vaping Use   Vaping status: Never Used  Substance and Sexual Activity   Alcohol use: No   Drug use: No   Sexual activity: Yes    Birth control/protection: None  Other Topics Concern   Not on file  Social History Narrative   Not on file   Social Determinants of Health   Financial Resource Strain: Not on file  Food Insecurity: No Food Insecurity (04/23/2022)   Hunger Vital Sign    Worried About Running Out of Food in the Last Year: Never true    Ran Out of Food in the Last Year: Never true  Transportation Needs: No Transportation Needs (04/23/2022)   PRAPARE - Administrator, Civil Service (Medical): No    Lack of Transportation (Non-Medical): No  Physical Activity: Not on file  Stress: Not on file  Social Connections: Not on file  Intimate Partner Violence: Not At Risk (04/23/2022)   Humiliation, Afraid, Rape, and Kick questionnaire    Fear of Current or Ex-Partner: No    Emotionally Abused: No    Physically Abused: No    Sexually Abused: No    Allergies  Allergen Reactions   Amoxicillin Other (See Comments)    Fatigue and Myalgia and Rigors x2 attempts    Current Outpatient Medications  Medication Sig Dispense Refill   acetaminophen (TYLENOL) 650 MG CR tablet Take 1 tablet (650 mg  total) by mouth every 8 (eight) hours as needed for pain. 90 tablet 3   AMBULATORY NON FORMULARY MEDICATION One Touch Ultra 2 glucometer 1 each 0   aspirin EC 81 MG tablet Take 81 mg by mouth at bedtime.     Blood Glucose Monitoring Suppl (ONETOUCH VERIO REFLECT) w/Device KIT Check fasting blood sugar every morning and 2 hours after largest meal of the day., 1 kit 0  clobetasol ointment (TEMOVATE) 0.05 % APPLY TOPICALLY TWO TIMES A DAY AS NEEDED 45 g 2   fenofibrate 160 MG tablet Take 1 tablet (160 mg total) by mouth daily. 90 tablet 3   fluticasone (FLONASE) 50 MCG/ACT nasal spray Place 1 spray into both nostrils daily as needed for allergies or rhinitis.     glipiZIDE (GLUCOTROL XL) 10 MG 24 hr tablet TAKE 1 TABLET BY MOUTH EVERY DAY WITH BREAKFAST 90 tablet 3   JANUVIA 100 MG tablet TAKE 1 TABLET BY MOUTH EVERYDAY AT BEDTIME 90 tablet 1   Lancets (ONETOUCH DELICA PLUS LANCET33G) MISC USE UP TO 3 TIMES DAILY. 100 each 11   lisinopril-hydrochlorothiazide (ZESTORETIC) 20-25 MG tablet TAKE 1 TABLET DAILY. 90 tablet 3   magnesium oxide (MAG-OX) 400 MG tablet Take 2 tablets (800 mg total) by mouth at bedtime. (Patient taking differently: Take 400 mg by mouth as needed (cramps).) 180 tablet 3   metFORMIN (GLUCOPHAGE) 1000 MG tablet TAKE 1 TABLET (1,000 MG TOTAL) BY MOUTH TWICE A DAY WITH FOOD 180 tablet 1   omega-3 acid ethyl esters (LOVAZA) 1 g capsule TAKE 2 CAPSULES BY MOUTH TWICE A DAY 360 capsule 3   ONETOUCH VERIO test strip CHECK FASTING BLOOD SUGAR EVERY MORNING AND 2 HOURS AFTER LARGEST MEAL OF THE DAY., 100 strip 12   simvastatin (ZOCOR) 40 MG tablet TAKE 1 TABLET BY MOUTH EVERYDAY AT BEDTIME 90 tablet 0   solifenacin (VESICARE) 5 MG tablet Take 1 tablet (5 mg total) by mouth daily.     tamsulosin (FLOMAX) 0.4 MG CAPS capsule TAKE ONE CAPSULE BY MOUTH TWICE A DAY 180 capsule 3   No current facility-administered medications for this visit.    PHYSICAL EXAM There were no vitals filed for  this visit.    Constitutional: well appearing. no distress. Appears well nourished.  Neurologic: CN intact. no focal findings. no sensory loss. Psychiatric:  Mood and affect symmetric and appropriate. Eyes:  No icterus. No conjunctival pallor. Ears, nose, throat:  mucous membranes moist. Midline trachea.  Cardiac: regular rate and rhythm.  Respiratory:  unlabored. Abdominal:  soft, non-tender, non-distended.  Peripheral vascular: 2+ L DP / PT pulse. Absent R DP / PT pulse Extremity: no edema. no cyanosis. no pallor.  Skin: no gangrene. no ulceration.  Lymphatic: no Stemmer's sign. no palpable lymphadenopathy.  PERTINENT LABORATORY AND RADIOLOGIC DATA  Most recent CBC    Latest Ref Rng & Units 10/13/2022   10:21 AM 09/06/2022    9:19 AM 05/02/2022   12:00 AM  CBC  WBC 4.0 - 10.5 K/uL 5.1  5.6  6.0   Hemoglobin 13.0 - 17.0 g/dL 09.8  11.9  14.7   Hematocrit 39.0 - 52.0 % 34.2  36.6  36.9   Platelets 150 - 400 K/uL 354  354  353      Most recent CMP    Latest Ref Rng & Units 10/13/2022   10:21 AM 09/06/2022    9:19 AM 05/02/2022   12:00 AM  CMP  Glucose 70 - 99 mg/dL 829  562  130   BUN 8 - 23 mg/dL 43  36  36   Creatinine 0.61 - 1.24 mg/dL 8.65  7.84  6.96   Sodium 135 - 145 mmol/L 134  140  134   Potassium 3.5 - 5.1 mmol/L 4.7  5.0  5.0   Chloride 98 - 111 mmol/L 104  107  102   CO2 22 - 32 mmol/L 24  25  23   Calcium 8.9 - 10.3 mg/dL 9.8  9.7  9.7   Total Protein 6.1 - 8.1 g/dL  7.3  7.0   Total Bilirubin 0.2 - 1.2 mg/dL  0.3  0.3   AST 10 - 35 U/L  14  14   ALT 9 - 46 U/L  14  17     Renal function CrCl cannot be calculated (Patient's most recent lab result is older than the maximum 21 days allowed.).  Hemoglobin A1C (%)  Date Value  11/28/2022 7.0 (A)   HbA1c, POC (controlled diabetic range) (%)  Date Value  02/16/2022 7.7 (A)   Hgb A1c MFr Bld (% of total Hgb)  Date Value  09/06/2022 8.1 (H)    LDL Cholesterol (Calc)  Date Value Ref Range Status   09/06/2022 81 mg/dL (calc) Final    Comment:    Reference range: <100 . Desirable range <100 mg/dL for primary prevention;   <70 mg/dL for patients with CHD or diabetic patients  with > or = 2 CHD risk factors. Marland Kitchen LDL-C is now calculated using the Martin-Hopkins  calculation, which is a validated novel method providing  better accuracy than the Friedewald equation in the  estimation of LDL-C.  Horald Pollen et al. Lenox Ahr. 0981;191(47): 2061-2068  (http://education.[employer].com/faq/FAQ164)    Direct LDL  Date Value Ref Range Status  07/25/2018 58 <100 mg/dL Final    Comment:    . Desirable range <100 mg/dL for primary prevention;   <70 mg/dL for patients with CHD or diabetic patients  with > or = 2 CHD risk factors. .     Prior right lower extremity angiogram from 12/14/21 reviewed CFA stenosis noted. Good technical result from SFA intervention.  Rande Brunt. Lenell Antu, MD [facility] Phone Number: [phone] 05/15/2023 11:00 AM  Total time spent on preparing this encounter including chart review, data review, collecting history, examining the patient, coordinating care for this established patient, 30 minutes.   Portions of this report may have been transcribed using voice recognition software.  Every effort has been made to ensure accuracy; however, inadvertent computerized transcription errors may still be present.

## 2023-05-16 ENCOUNTER — Ambulatory Visit (HOSPITAL_COMMUNITY)
Admission: RE | Admit: 2023-05-16 | Discharge: 2023-05-16 | Disposition: A | Discharge status: Home / Self Care | Payer: PPO | Source: Ambulatory Visit | Attending: Vascular Surgery | Admitting: Vascular Surgery

## 2023-05-16 ENCOUNTER — Ambulatory Visit (INDEPENDENT_AMBULATORY_CARE_PROVIDER_SITE_OTHER)
Admission: RE | Admit: 2023-05-16 | Discharge: 2023-05-16 | Disposition: A | Discharge status: Home / Self Care | Payer: PPO | Source: Ambulatory Visit | Attending: Vascular Surgery | Admitting: Vascular Surgery

## 2023-05-16 ENCOUNTER — Ambulatory Visit: Payer: PPO | Admitting: Vascular Surgery

## 2023-05-16 ENCOUNTER — Encounter: Payer: Self-pay | Admitting: Vascular Surgery

## 2023-05-16 VITALS — BP 134/73 | HR 68 | Temp 97.8°F | Resp 20 | Ht 61.0 in | Wt 132.0 lb

## 2023-05-16 DIAGNOSIS — I739 Peripheral vascular disease, unspecified: Secondary | ICD-10-CM | POA: Diagnosis not present

## 2023-05-16 DIAGNOSIS — I70219 Atherosclerosis of native arteries of extremities with intermittent claudication, unspecified extremity: Secondary | ICD-10-CM | POA: Diagnosis not present

## 2023-05-16 DIAGNOSIS — I70211 Atherosclerosis of native arteries of extremities with intermittent claudication, right leg: Secondary | ICD-10-CM | POA: Diagnosis not present

## 2023-05-16 LAB — VAS US ABI WITH/WO TBI
Left ABI: 0.97
Right ABI: 0.64

## 2023-05-22 ENCOUNTER — Other Ambulatory Visit: Payer: Self-pay

## 2023-05-22 DIAGNOSIS — I70219 Atherosclerosis of native arteries of extremities with intermittent claudication, unspecified extremity: Secondary | ICD-10-CM

## 2023-05-26 ENCOUNTER — Encounter (HOSPITAL_COMMUNITY)
Admission: RE | Disposition: A | Discharge status: Home / Self Care | Payer: Self-pay | Source: Home / Self Care | Attending: Vascular Surgery

## 2023-05-26 ENCOUNTER — Other Ambulatory Visit: Payer: Self-pay

## 2023-05-26 ENCOUNTER — Ambulatory Visit (HOSPITAL_COMMUNITY)
Admission: RE | Admit: 2023-05-26 | Discharge: 2023-05-26 | Disposition: A | Discharge status: Home / Self Care | Payer: PPO | Attending: Vascular Surgery | Admitting: Vascular Surgery

## 2023-05-26 DIAGNOSIS — E1151 Type 2 diabetes mellitus with diabetic peripheral angiopathy without gangrene: Secondary | ICD-10-CM | POA: Diagnosis not present

## 2023-05-26 DIAGNOSIS — Z7984 Long term (current) use of oral hypoglycemic drugs: Secondary | ICD-10-CM | POA: Diagnosis not present

## 2023-05-26 DIAGNOSIS — I70219 Atherosclerosis of native arteries of extremities with intermittent claudication, unspecified extremity: Secondary | ICD-10-CM

## 2023-05-26 DIAGNOSIS — I70211 Atherosclerosis of native arteries of extremities with intermittent claudication, right leg: Secondary | ICD-10-CM | POA: Diagnosis not present

## 2023-05-26 HISTORY — PX: ABDOMINAL AORTOGRAM W/LOWER EXTREMITY: CATH118223

## 2023-05-26 LAB — GLUCOSE, CAPILLARY: Glucose-Capillary: 210 mg/dL — ABNORMAL HIGH (ref 70–99)

## 2023-05-26 LAB — POCT I-STAT, CHEM 8
BUN: 50 mg/dL — ABNORMAL HIGH (ref 8–23)
Calcium, Ion: 1.24 mmol/L (ref 1.15–1.40)
Chloride: 106 mmol/L (ref 98–111)
Creatinine, Ser: 2.2 mg/dL — ABNORMAL HIGH (ref 0.61–1.24)
Glucose, Bld: 191 mg/dL — ABNORMAL HIGH (ref 70–99)
HCT: 37 % — ABNORMAL LOW (ref 39.0–52.0)
Hemoglobin: 12.6 g/dL — ABNORMAL LOW (ref 13.0–17.0)
Potassium: 4.6 mmol/L (ref 3.5–5.1)
Sodium: 137 mmol/L (ref 135–145)
TCO2: 21 mmol/L — ABNORMAL LOW (ref 22–32)

## 2023-05-26 SURGERY — ABDOMINAL AORTOGRAM W/LOWER EXTREMITY
Anesthesia: LOCAL

## 2023-05-26 MED ORDER — FENTANYL CITRATE (PF) 100 MCG/2ML IJ SOLN
INTRAMUSCULAR | Status: DC | PRN
  Administered 2023-05-26: 50 ug via INTRAVENOUS

## 2023-05-26 MED ORDER — MIDAZOLAM HCL 2 MG/2ML IJ SOLN
INTRAMUSCULAR | Status: AC
  Filled 2023-05-26: qty 2

## 2023-05-26 MED ORDER — FENTANYL CITRATE (PF) 100 MCG/2ML IJ SOLN
INTRAMUSCULAR | Status: AC
  Filled 2023-05-26: qty 2

## 2023-05-26 MED ORDER — SODIUM CHLORIDE 0.9% FLUSH: 3.0000 mL | INTRAVENOUS | Status: DC | PRN

## 2023-05-26 MED ORDER — SODIUM CHLORIDE 0.9% FLUSH: 3.0000 mL | Freq: Two times a day (BID) | INTRAVENOUS | Status: DC

## 2023-05-26 MED ORDER — SODIUM CHLORIDE 0.9 % IV SOLN: INTRAVENOUS | Status: DC

## 2023-05-26 MED ORDER — MIDAZOLAM HCL 2 MG/2ML IJ SOLN
INTRAMUSCULAR | Status: DC | PRN
  Administered 2023-05-26: 1 mg via INTRAVENOUS

## 2023-05-26 MED ORDER — HEPARIN (PORCINE) IN NACL 2000-0.9 UNIT/L-% IV SOLN
INTRAVENOUS | Status: DC | PRN
  Administered 2023-05-26: 1000 mL

## 2023-05-26 MED ORDER — LIDOCAINE HCL (PF) 1 % IJ SOLN
INTRAMUSCULAR | Status: DC | PRN
  Administered 2023-05-26: 15 mL

## 2023-05-26 MED ORDER — ONDANSETRON HCL 4 MG/2ML IJ SOLN: 4.0000 mg | Freq: Four times a day (QID) | INTRAMUSCULAR | Status: DC | PRN

## 2023-05-26 MED ORDER — LIDOCAINE HCL (PF) 1 % IJ SOLN
INTRAMUSCULAR | Status: AC
  Filled 2023-05-26: qty 30

## 2023-05-26 MED ORDER — LABETALOL HCL 5 MG/ML IV SOLN: 10.0000 mg | INTRAVENOUS | Status: DC | PRN

## 2023-05-26 MED ORDER — ACETAMINOPHEN 325 MG PO TABS: 650.0000 mg | ORAL_TABLET | ORAL | Status: DC | PRN

## 2023-05-26 MED ORDER — HYDRALAZINE HCL 20 MG/ML IJ SOLN: 5.0000 mg | INTRAMUSCULAR | Status: DC | PRN

## 2023-05-26 MED ORDER — SODIUM CHLORIDE 0.9 % IV SOLN: 250.0000 mL | INTRAVENOUS | Status: DC | PRN

## 2023-05-26 MED ORDER — SODIUM CHLORIDE 0.9 % WEIGHT BASED INFUSION: 1.0000 mL/kg/h | INTRAVENOUS | Status: DC

## 2023-05-26 SURGICAL SUPPLY — 11 items
CATH OMNI FLUSH 5F 65CM (CATHETERS) IMPLANT
COVER DOME SNAP 22 D (MISCELLANEOUS) IMPLANT
GUIDEWIRE ANGLED .035X150CM (WIRE) IMPLANT
KIT ANGIASSIST CO2 SYSTEM (KITS) IMPLANT
KIT MICROPUNCTURE NIT STIFF (SHEATH) IMPLANT
KIT SYRINGE INJ CVI SPIKEX1 (MISCELLANEOUS) IMPLANT
SET ATX-X65L (MISCELLANEOUS) IMPLANT
SHEATH PINNACLE 5F 10CM (SHEATH) IMPLANT
SHEATH PROBE COVER 6X72 (BAG) IMPLANT
TRAY PV CATH (CUSTOM PROCEDURE TRAY) ×1 IMPLANT
WIRE BENTSON .035X145CM (WIRE) IMPLANT

## 2023-05-26 NOTE — Op Note (Signed)
DATE OF SERVICE: 05/26/2023  PATIENT:  Michael Thomas  77 y.o. male  PRE-OPERATIVE DIAGNOSIS:  Atherosclerosis of native arteries of right lower extremity causing claudication  POST-OPERATIVE DIAGNOSIS:  Same  PROCEDURE:   1) Ultrasound guided left common femoral artery access 2) Aortogram 3) Right lower extremity angiogram with second order cannulation (4mL total contrast) 4) Conscious sedation (17 minutes)  SURGEON:  Rande Brunt. Lenell Antu, MD  ASSISTANT: none  ANESTHESIA:   local and IV sedation  ESTIMATED BLOOD LOSS: minimal  LOCAL MEDICATIONS USED:  LIDOCAINE   COUNTS: confirmed correct.  PATIENT DISPOSITION:  PACU - hemodynamically stable.   Delay start of Pharmacological VTE agent (>24hrs) due to surgical blood loss or risk of bleeding: no  INDICATION FOR PROCEDURE: Michael Thomas is a 77 y.o. male with right leg claudication and non-invasive evidence of significant common femoral plaque. After careful discussion of risks, benefits, and alternatives the patient was offered angiography. The patient understood and wished to proceed.  OPERATIVE FINDINGS:  Renal arteries patent  Infrarenal aorta patent Common iliac arteries patent bilaterally Internal iliac arteries patent bilaterally External iliac arteries patent bilaterally  Right lower extremity: Common femoral artery: significant calcified plaque (70%) in mid common femoral artery  Profunda femoris artery: patent  Superficial femoral artery: previous stenting with in-stent stenosis (greatest >75%) Popliteal artery: above knee artery widely patent; behind knee patent; below knee patent. Anterior tibial artery: patent proximally. Occludes in high ankle Tibioperoneal trunk: patent Peroneal artery: patent to ankle Posterior tibial artery: patent. Dominant tibial. Fills the foot Pedal circulation: poorly studied with CO2.   GLASS score. FP 2. IP 0. Stage II.   WIfI score. N/A.  DESCRIPTION OF  PROCEDURE: After identification of the patient in the pre-operative holding area, the patient was transferred to the operating room. The patient was positioned supine on the operating room table. Anesthesia was induced. The groins was prepped and draped in standard fashion. A surgical pause was performed confirming correct patient, procedure, and operative location.  The left groin was anesthetized with subcutaneous injection of 1% lidocaine. Using ultrasound guidance, the left common femoral artery was accessed with micropuncture technique. Fluoroscopy was used to confirm cannulation over the femoral head. Limited common femoral angiogram was performed.The 54F sheath was upsized to 8F.   A Benson wire was advanced into the distal aorta. Over the wire an omni flush catheter was advanced to the level of L2. Aortogram was performed - see above for details.   The right common iliac artery was selected with an omniflush catheter and glidewire guidewire. The wire was advanced into the common femoral artery. Over the wire the omni flush catheter was advanced into the external iliac artery. Selective angiography was performed - see above for details.   The sheath was left in place to be removed in the recovery area.   Conscious sedation was administered with the use of IV fentanyl and midazolam under continuous physician and nurse monitoring.  Heart rate, blood pressure, and oxygen saturation were continuously monitored.  Total sedation time was 17 minutes  Upon completion of the case instrument and sharps counts were confirmed correct. The patient was transferred to the PACU in good condition. I was present for all portions of the procedure.  PLAN: Continue ASA / Statin. Needs RLE femoral endarterectomy. Will schedule as soon as possible.   Rande Brunt. Lenell Antu, MD [facility] Phone Number: [phone] 05/26/2023 9:56 AM

## 2023-05-26 NOTE — Progress Notes (Signed)
Site Area: left femoral   Site prior to removal: level 0  Pressure applied for: 25 minutes   Bedrest beginning at: 1040  Manual: yes  Patient status during pull: stable  Post pull groin site: level 0  Post pull instructions given: yes   Post pull pulses present: palpable left and right DP pulses (+1 on left, +1 on right)  Dressing applied: gauze and tegaderm   Comments:

## 2023-05-26 NOTE — Discharge Instructions (Signed)
HOLD METFORMIN FOR A FULL 48 HOURS AFTER DISCHARGE. 

## 2023-05-26 NOTE — Interval H&P Note (Signed)
History and Physical Interval Note:  05/26/2023 9:32 AM  Michael Thomas  has presented today for surgery, with the diagnosis of Atherosclerosis of native artery of lower extremity with intermittent claudication.  The various methods of treatment have been discussed with the patient and family. After consideration of risks, benefits and other options for treatment, the patient has consented to  Procedure(s): ABDOMINAL AORTOGRAM W/LOWER EXTREMITY (N/A) as a surgical intervention.  The patient's history has been reviewed, patient examined, no change in status, stable for surgery.  I have reviewed the patient's chart and labs.  Questions were answered to the patient's satisfaction.     [name]

## 2023-05-29 ENCOUNTER — Encounter (HOSPITAL_COMMUNITY): Payer: Self-pay | Admitting: Vascular Surgery

## 2023-05-29 ENCOUNTER — Other Ambulatory Visit: Payer: Self-pay

## 2023-05-29 DIAGNOSIS — I739 Peripheral vascular disease, unspecified: Secondary | ICD-10-CM

## 2023-05-29 DIAGNOSIS — I70219 Atherosclerosis of native arteries of extremities with intermittent claudication, unspecified extremity: Secondary | ICD-10-CM

## 2023-05-29 NOTE — Plan of Care (Signed)
CHL Tonsillectomy/Adenoidectomy, Postoperative PEDS care plan entered in error.

## 2023-06-01 ENCOUNTER — Encounter: Payer: Self-pay | Admitting: Sports Medicine

## 2023-06-01 ENCOUNTER — Ambulatory Visit: Payer: PPO | Admitting: Sports Medicine

## 2023-06-01 VITALS — BP 126/69 | HR 60 | Ht 61.0 in | Wt 131.0 lb

## 2023-06-01 DIAGNOSIS — N1832 Chronic kidney disease, stage 3b: Secondary | ICD-10-CM | POA: Diagnosis not present

## 2023-06-01 DIAGNOSIS — Z7984 Long term (current) use of oral hypoglycemic drugs: Secondary | ICD-10-CM | POA: Diagnosis not present

## 2023-06-01 DIAGNOSIS — R809 Proteinuria, unspecified: Secondary | ICD-10-CM | POA: Diagnosis not present

## 2023-06-01 DIAGNOSIS — E1129 Type 2 diabetes mellitus with other diabetic kidney complication: Secondary | ICD-10-CM

## 2023-06-01 DIAGNOSIS — E1122 Type 2 diabetes mellitus with diabetic chronic kidney disease: Secondary | ICD-10-CM | POA: Diagnosis not present

## 2023-06-01 DIAGNOSIS — M47816 Spondylosis without myelopathy or radiculopathy, lumbar region: Secondary | ICD-10-CM

## 2023-06-01 DIAGNOSIS — M5416 Radiculopathy, lumbar region: Secondary | ICD-10-CM | POA: Diagnosis not present

## 2023-06-01 DIAGNOSIS — E118 Type 2 diabetes mellitus with unspecified complications: Secondary | ICD-10-CM

## 2023-06-01 DIAGNOSIS — E1169 Type 2 diabetes mellitus with other specified complication: Secondary | ICD-10-CM | POA: Diagnosis not present

## 2023-06-01 LAB — POCT GLYCOSYLATED HEMOGLOBIN (HGB A1C): Hemoglobin A1C: 7.9 % — AB (ref 4.0–5.6)

## 2023-06-01 MED ORDER — SITAGLIPTIN PHOSPHATE 100 MG PO TABS
100.0000 mg | ORAL_TABLET | Freq: Every day | ORAL | 3 refills | Status: DC
Start: 2023-06-01 — End: 2023-08-03

## 2023-06-01 NOTE — Assessment & Plan Note (Signed)
Adequately controlled diabetes, A1c 7.9% today. Refilling Januvia. He did have to come off his metformin recently for an angiography procedure. Return in 6 months for this.

## 2023-06-01 NOTE — Assessment & Plan Note (Addendum)
Michael Thomas continues to have axial left-sided low back pain. An MRI in 2019 did show widespread degenerative changes. At the last visit approximately 3 months ago we had him do some home physical therapy for greater than 6 weeks, unfortunately he has not improved so we will proceed with MRI, he would like [place] to proceed with epidural as soon as we see the MRI. He would like this all done in [place].  Update: MRI does show multiple small disc protrusions with mild desiccation but the dominant finding is bilateral L4-S1 facet joint arthritis, ordering facet injections, we can do these in [name] where he lives.

## 2023-06-01 NOTE — Progress Notes (Addendum)
    Procedures performed today:    None.  Independent interpretation of notes and tests performed by another provider:   None.  Brief History, Exam, Impression, and Recommendations:    Type 2 diabetes mellitus with renal complication (HCC) Adequately controlled diabetes, A1c 7.9% today. Refilling Januvia. He did have to come off his metformin recently for an angiography procedure. Return in 6 months for this.  Lumbar spondylosis Celene Kras continues to have axial left-sided low back pain. An MRI in 2019 did show widespread degenerative changes. At the last visit approximately 3 months ago we had him do some home physical therapy for greater than 6 weeks, unfortunately he has not improved so we will proceed with MRI, he would like [place] to proceed with epidural as soon as we see the MRI. He would like this all done in [place].  Update: MRI does show multiple small disc protrusions with mild desiccation but the dominant finding is bilateral L4-S1 facet joint arthritis, ordering facet injections, we can do these in [place] where he lives.  I spent 30 minutes of total time managing this patient today, this includes chart review, face to face, and non-face to face time.  ____________________________________________ Ihor Austin. Benjamin Stain, M.D., ABFM., CAQSM., AME. Primary Care and Sports Medicine [facility]  Adjunct Professor of Family Medicine  [employer]  Restaurant manager, fast food

## 2023-06-06 DIAGNOSIS — D631 Anemia in chronic kidney disease: Secondary | ICD-10-CM | POA: Diagnosis not present

## 2023-06-06 DIAGNOSIS — N16 Renal tubulo-interstitial disorders in diseases classified elsewhere: Secondary | ICD-10-CM | POA: Diagnosis not present

## 2023-06-06 DIAGNOSIS — E1122 Type 2 diabetes mellitus with diabetic chronic kidney disease: Secondary | ICD-10-CM | POA: Diagnosis not present

## 2023-06-06 DIAGNOSIS — R809 Proteinuria, unspecified: Secondary | ICD-10-CM | POA: Diagnosis not present

## 2023-06-06 DIAGNOSIS — N400 Enlarged prostate without lower urinary tract symptoms: Secondary | ICD-10-CM | POA: Diagnosis not present

## 2023-06-06 DIAGNOSIS — I10 Essential (primary) hypertension: Secondary | ICD-10-CM | POA: Diagnosis not present

## 2023-06-06 DIAGNOSIS — E785 Hyperlipidemia, unspecified: Secondary | ICD-10-CM | POA: Diagnosis not present

## 2023-06-06 DIAGNOSIS — K760 Fatty (change of) liver, not elsewhere classified: Secondary | ICD-10-CM | POA: Diagnosis not present

## 2023-06-06 DIAGNOSIS — N281 Cyst of kidney, acquired: Secondary | ICD-10-CM | POA: Diagnosis not present

## 2023-06-06 DIAGNOSIS — N1832 Chronic kidney disease, stage 3b: Secondary | ICD-10-CM | POA: Diagnosis not present

## 2023-06-07 NOTE — Progress Notes (Signed)
Surgical Instructions   Your procedure is scheduled on Thursday June 15, 2023. Report to [facility] Main Entrance "A" at 5:30 A.M., then check in with the Admitting office. Any questions or running late day of surgery: call [phone]  Questions prior to your surgery date: call [phone], Monday-Friday, 8am-4pm. If you experience any cold or flu symptoms such as cough, fever, chills, shortness of breath, etc. between now and your scheduled surgery, please notify us at the above number.     Remember:  Do not eat or drink after midnight the night before your surgery  Take these medicines the morning of surgery with A SIP OF WATER  aspirin EC  Fenofibrate solifenacin (VESICARE)   tamsulosin (FLOMAX)   One week prior to surgery, STOP taking any Aleve, Naproxen, Ibuprofen, Motrin, Advil, Goody's, BC's, all herbal medications, fish oil, and non-prescription vitamins.   WHAT DO I DO ABOUT MY DIABETES MEDICATION?   Do not take oral diabetes medicines (pills) the morning of surgery.        DO NOT TAKE YOUR glipiZIDE (GLUCOTROL XL) THE MORNING OF YOUR SURGERY.         DO NOT TAKE YOUR  metFORMIN (GLUCOPHAGE) THE MORNING OF YOUR SURGERY.         DO NOT TAKE YOUR  sitaGLIPtin (JANUVIA) THE MORNING OF YOUR SURGERY.    The day of surgery, do not take other diabetes injectables, including Byetta (exenatide), Bydureon (exenatide ER), Victoza (liraglutide), or Trulicity (dulaglutide).  If your CBG is greater than 220 mg/dL, you may take  of your sliding scale (correction) dose of insulin.   HOW TO MANAGE YOUR DIABETES BEFORE AND AFTER SURGERY  Why is it important to control my blood sugar before and after surgery? Improving blood sugar levels before and after surgery helps healing and can limit problems. A way of improving blood sugar control is eating a healthy diet by:  Eating less sugar and carbohydrates  Increasing activity/exercise  Talking with your doctor about reaching  your blood sugar goals High blood sugars (greater than 180 mg/dL) can raise your risk of infections and slow your recovery, so you will need to focus on controlling your diabetes during the weeks before surgery. Make sure that the doctor who takes care of your diabetes knows about your planned surgery including the date and location.  How do I manage my blood sugar before surgery? Check your blood sugar at least 4 times a day, starting 2 days before surgery, to make sure that the level is not too high or low.  Check your blood sugar the morning of your surgery when you wake up and every 2 hours until you get to the Short Stay unit.  If your blood sugar is less than 70 mg/dL, you will need to treat for low blood sugar: Do not take insulin. Treat a low blood sugar (less than 70 mg/dL) with  cup of clear juice (cranberry or apple), 4 glucose tablets, OR glucose gel. Recheck blood sugar in 15 minutes after treatment (to make sure it is greater than 70 mg/dL). If your blood sugar is not greater than 70 mg/dL on recheck, call [phone] for further instructions. Report your blood sugar to the short stay nurse when you get to Short Stay.  If you are admitted to the hospital after surgery: Your blood sugar will be checked by the staff and you will probably be given insulin after surgery (instead of oral diabetes medicines) to make sure you have good blood  sugar levels. The goal for blood sugar control after surgery is 80-180 mg/dL.                      Do NOT Smoke (Tobacco/Vaping) for 24 hours prior to your procedure.  If you use a CPAP at night, you may bring your mask/headgear for your overnight stay.   You will be asked to remove any contacts, glasses, piercing's, hearing aid's, dentures/partials prior to surgery. Please bring cases for these items if needed.    Patients discharged the day of surgery will not be allowed to drive home, and someone needs to stay with them for 24  hours.  SURGICAL WAITING ROOM VISITATION Patients may have no more than 2 support people in the waiting area - these visitors may rotate.   Pre-op nurse will coordinate an appropriate time for 1 ADULT support person, who may not rotate, to accompany patient in pre-op.  Children under the age of 63 must have an adult with them who is not the patient and must remain in the main waiting area with an adult.  If the patient needs to stay at the hospital during part of their recovery, the visitor guidelines for inpatient rooms apply.  Please refer to the [facility] website for the visitor guidelines for any additional information.   If you received a COVID test during your pre-op visit  it is requested that you wear a mask when out in public, stay away from anyone that may not be feeling well and notify your surgeon if you develop symptoms. If you have been in contact with anyone that has tested positive in the last 10 days please notify you surgeon.      Pre-operative CHG Bathing Instructions   You can play a key role in reducing the risk of infection after surgery. Your skin needs to be as free of germs as possible. You can reduce the number of germs on your skin by washing with CHG (chlorhexidine gluconate) soap before surgery. CHG is an antiseptic soap that kills germs and continues to kill germs even after washing.   DO NOT use if you have an allergy to chlorhexidine/CHG or antibacterial soaps. If your skin becomes reddened or irritated, stop using the CHG and notify one of our RNs at [phone].              TAKE A SHOWER THE NIGHT BEFORE SURGERY AND THE DAY OF SURGERY    Please keep in mind the following:  DO NOT shave, including legs and underarms, 48 hours prior to surgery.   You may shave your face before/day of surgery.  Place clean sheets on your bed the night before surgery Use a clean washcloth (not used since being washed) for each shower. DO NOT sleep with pet's night  before surgery.  CHG Shower Instructions:  Wash your face and private area with normal soap. If you choose to wash your hair, wash first with your normal shampoo.  After you use shampoo/soap, rinse your hair and body thoroughly to remove shampoo/soap residue.  Turn the water OFF and apply half the bottle of CHG soap to a CLEAN washcloth.  Apply CHG soap ONLY FROM YOUR NECK DOWN TO YOUR TOES (washing for 3-5 minutes)  DO NOT use CHG soap on face, private areas, open wounds, or sores.  Pay special attention to the area where your surgery is being performed.  If you are having back surgery, having someone wash your back  for you may be helpful. Wait 2 minutes after CHG soap is applied, then you may rinse off the CHG soap.  Pat dry with a clean towel  Put on clean pajamas    Additional instructions for the day of surgery: DO NOT APPLY any lotions, deodorants or cologne.    Do not wear jewelry  Do not bring valuables to the hospital. [facility] is not responsible for valuables/personal belongings. Put on clean/comfortable clothes.  Please brush your teeth.  Ask your nurse before applying any prescription medications to the skin.

## 2023-06-08 ENCOUNTER — Encounter (HOSPITAL_COMMUNITY)
Admission: RE | Admit: 2023-06-08 | Discharge: 2023-06-08 | Disposition: A | Discharge status: Home / Self Care | Payer: PPO | Source: Ambulatory Visit | Attending: Vascular Surgery | Admitting: Vascular Surgery

## 2023-06-08 ENCOUNTER — Other Ambulatory Visit: Payer: Self-pay

## 2023-06-08 ENCOUNTER — Encounter (HOSPITAL_COMMUNITY): Payer: Self-pay

## 2023-06-08 VITALS — BP 135/63 | HR 66 | Temp 97.9°F | Resp 16 | Ht 61.0 in | Wt 132.6 lb

## 2023-06-08 DIAGNOSIS — Z01818 Encounter for other preprocedural examination: Secondary | ICD-10-CM

## 2023-06-08 DIAGNOSIS — I70219 Atherosclerosis of native arteries of extremities with intermittent claudication, unspecified extremity: Secondary | ICD-10-CM | POA: Insufficient documentation

## 2023-06-08 DIAGNOSIS — I739 Peripheral vascular disease, unspecified: Secondary | ICD-10-CM | POA: Diagnosis present

## 2023-06-08 DIAGNOSIS — Z01812 Encounter for preprocedural laboratory examination: Secondary | ICD-10-CM | POA: Diagnosis not present

## 2023-06-08 HISTORY — DX: Essential (primary) hypertension: I10

## 2023-06-08 HISTORY — DX: Personal history of urinary calculi: Z87.442

## 2023-06-08 HISTORY — DX: Chronic kidney disease, unspecified: N18.9

## 2023-06-08 LAB — PROTIME-INR
INR: 1.1 (ref 0.8–1.2)
Prothrombin Time: 14.2 s (ref 11.4–15.2)

## 2023-06-08 LAB — COMPREHENSIVE METABOLIC PANEL
ALT: 20 U/L (ref 0–44)
AST: 20 U/L (ref 15–41)
Albumin: 3.9 g/dL (ref 3.5–5.0)
Alkaline Phosphatase: 27 U/L — ABNORMAL LOW (ref 38–126)
Anion gap: 13 (ref 5–15)
BUN: 39 mg/dL — ABNORMAL HIGH (ref 8–23)
CO2: 23 mmol/L (ref 22–32)
Calcium: 9.8 mg/dL (ref 8.9–10.3)
Chloride: 104 mmol/L (ref 98–111)
Creatinine, Ser: 1.77 mg/dL — ABNORMAL HIGH (ref 0.61–1.24)
GFR, Estimated: 39 mL/min — ABNORMAL LOW (ref >60)
Glucose, Bld: 105 mg/dL — ABNORMAL HIGH (ref 70–99)
Potassium: 5 mmol/L (ref 3.5–5.1)
Sodium: 140 mmol/L (ref 135–145)
Total Bilirubin: 0.5 mg/dL (ref 0.3–1.2)
Total Protein: 7.1 g/dL (ref 6.5–8.1)

## 2023-06-08 LAB — SURGICAL PCR SCREEN
MRSA, PCR: NEGATIVE
Staphylococcus aureus: NEGATIVE

## 2023-06-08 LAB — TYPE AND SCREEN
ABO/RH(D): A POS
Antibody Screen: NEGATIVE

## 2023-06-08 LAB — GLUCOSE, CAPILLARY: Glucose-Capillary: 167 mg/dL — ABNORMAL HIGH (ref 70–99)

## 2023-06-08 LAB — CBC
HCT: 36.8 % — ABNORMAL LOW (ref 39.0–52.0)
Hemoglobin: 12 g/dL — ABNORMAL LOW (ref 13.0–17.0)
MCH: 28.1 pg (ref 26.0–34.0)
MCHC: 32.6 g/dL (ref 30.0–36.0)
MCV: 86.2 fL (ref 80.0–100.0)
Platelets: 373 10*3/uL (ref 150–400)
RBC: 4.27 MIL/uL (ref 4.22–5.81)
RDW: 14.3 % (ref 11.5–15.5)
WBC: 6.2 10*3/uL (ref 4.0–10.5)
nRBC: 0 % (ref 0.0–0.2)

## 2023-06-08 LAB — APTT: aPTT: 28 s (ref 24–36)

## 2023-06-08 LAB — URINALYSIS, ROUTINE W REFLEX MICROSCOPIC
Bilirubin Urine: NEGATIVE
Glucose, UA: NEGATIVE mg/dL
Hgb urine dipstick: NEGATIVE
Ketones, ur: NEGATIVE mg/dL
Leukocytes,Ua: NEGATIVE
Nitrite: NEGATIVE
Protein, ur: NEGATIVE mg/dL
Specific Gravity, Urine: 1.012 (ref 1.005–1.030)
pH: 7 (ref 5.0–8.0)

## 2023-06-08 NOTE — Progress Notes (Signed)
PCP - Dr. Benjamin Stain  Cardiologist - no  EP-no  Endocrine-no  Pulm-no  Chest x-ray - 05/26/23  EKG - 05/26/23  Stress Test - no  ECHO - no  Cardiac Cath - no  AICD-no PM-no LOOP-no  Nerve Stimulator-no  Dialysis-no- followed by a nephrologist in [place]  Sleep Study -  CPAP - Yes  LABS-CBC, CMP, PT, PTT, T/S, UA, PCR  ASA-hold 5 days- patient reported  Plavix hold 5 days  ERAS-no  HA1C-06/01/23 Hemoglobin -A1C 7.9 GLP-1-no Fasting Blood Sugar - 150-160 Checks Blood Sugar _1____ times a day  Anesthesia-  Pt denies having chest pain, sob, or fever at this time. All instructions explained to the pt, with a verbal understanding of the material. Pt agrees to go over the instructions while at home for a better understanding. The opportunity to ask questions was provided.

## 2023-06-14 NOTE — Progress Notes (Signed)
Pt made aware of surgery time change for 06/15/23 0940-1301, arrival 0740. Pt advised to follow all previous instructions given.

## 2023-06-15 ENCOUNTER — Inpatient Hospital Stay (HOSPITAL_COMMUNITY): Payer: PPO

## 2023-06-15 ENCOUNTER — Other Ambulatory Visit: Payer: Self-pay

## 2023-06-15 ENCOUNTER — Encounter (HOSPITAL_COMMUNITY): Payer: Self-pay | Admitting: Vascular Surgery

## 2023-06-15 ENCOUNTER — Encounter (HOSPITAL_COMMUNITY)
Admission: RE | Disposition: A | Discharge status: Home Health | Payer: Self-pay | Source: Home / Self Care | Attending: Vascular Surgery

## 2023-06-15 ENCOUNTER — Inpatient Hospital Stay (HOSPITAL_COMMUNITY): Payer: PPO | Admitting: Registered Nurse

## 2023-06-15 ENCOUNTER — Inpatient Hospital Stay (HOSPITAL_COMMUNITY)
Admission: RE | Admit: 2023-06-15 | Discharge: 2023-06-16 | DRG: 254 | Disposition: A | Discharge status: Home Health | Payer: PPO | Attending: Vascular Surgery | Admitting: Vascular Surgery

## 2023-06-15 DIAGNOSIS — N401 Enlarged prostate with lower urinary tract symptoms: Secondary | ICD-10-CM | POA: Diagnosis present

## 2023-06-15 DIAGNOSIS — E785 Hyperlipidemia, unspecified: Secondary | ICD-10-CM | POA: Diagnosis present

## 2023-06-15 DIAGNOSIS — I739 Peripheral vascular disease, unspecified: Secondary | ICD-10-CM | POA: Diagnosis present

## 2023-06-15 DIAGNOSIS — G4733 Obstructive sleep apnea (adult) (pediatric): Secondary | ICD-10-CM | POA: Diagnosis not present

## 2023-06-15 DIAGNOSIS — Z7982 Long term (current) use of aspirin: Secondary | ICD-10-CM | POA: Diagnosis not present

## 2023-06-15 DIAGNOSIS — N1832 Chronic kidney disease, stage 3b: Secondary | ICD-10-CM | POA: Diagnosis not present

## 2023-06-15 DIAGNOSIS — I70211 Atherosclerosis of native arteries of extremities with intermittent claudication, right leg: Secondary | ICD-10-CM

## 2023-06-15 DIAGNOSIS — Z87442 Personal history of urinary calculi: Secondary | ICD-10-CM | POA: Diagnosis not present

## 2023-06-15 DIAGNOSIS — Z7984 Long term (current) use of oral hypoglycemic drugs: Secondary | ICD-10-CM | POA: Diagnosis not present

## 2023-06-15 DIAGNOSIS — Z7902 Long term (current) use of antithrombotics/antiplatelets: Secondary | ICD-10-CM | POA: Diagnosis not present

## 2023-06-15 DIAGNOSIS — E1151 Type 2 diabetes mellitus with diabetic peripheral angiopathy without gangrene: Secondary | ICD-10-CM | POA: Diagnosis not present

## 2023-06-15 DIAGNOSIS — I70221 Atherosclerosis of native arteries of extremities with rest pain, right leg: Secondary | ICD-10-CM

## 2023-06-15 DIAGNOSIS — Z833 Family history of diabetes mellitus: Secondary | ICD-10-CM | POA: Diagnosis not present

## 2023-06-15 DIAGNOSIS — Z79899 Other long term (current) drug therapy: Secondary | ICD-10-CM | POA: Diagnosis not present

## 2023-06-15 DIAGNOSIS — Z823 Family history of stroke: Secondary | ICD-10-CM

## 2023-06-15 DIAGNOSIS — I129 Hypertensive chronic kidney disease with stage 1 through stage 4 chronic kidney disease, or unspecified chronic kidney disease: Secondary | ICD-10-CM | POA: Diagnosis not present

## 2023-06-15 HISTORY — DX: Atherosclerosis of native arteries of extremities with rest pain, right leg: I70.221

## 2023-06-15 HISTORY — PX: INTRAOPERATIVE ARTERIOGRAM: SHX5157

## 2023-06-15 HISTORY — PX: PATCH ANGIOPLASTY: SHX6230

## 2023-06-15 HISTORY — PX: ENDARTERECTOMY FEMORAL: SHX5804

## 2023-06-15 LAB — GLUCOSE, CAPILLARY
Glucose-Capillary: 138 mg/dL — ABNORMAL HIGH (ref 70–99)
Glucose-Capillary: 204 mg/dL — ABNORMAL HIGH (ref 70–99)
Glucose-Capillary: 232 mg/dL — ABNORMAL HIGH (ref 70–99)

## 2023-06-15 LAB — CREATININE, SERUM
Creatinine, Ser: 1.68 mg/dL — ABNORMAL HIGH (ref 0.61–1.24)
GFR, Estimated: 41 mL/min — ABNORMAL LOW (ref >60)

## 2023-06-15 LAB — CBC
HCT: 26.5 % — ABNORMAL LOW (ref 39.0–52.0)
Hemoglobin: 8.9 g/dL — ABNORMAL LOW (ref 13.0–17.0)
MCH: 28.4 pg (ref 26.0–34.0)
MCHC: 33.6 g/dL (ref 30.0–36.0)
MCV: 84.7 fL (ref 80.0–100.0)
Platelets: 307 10*3/uL (ref 150–400)
RBC: 3.13 MIL/uL — ABNORMAL LOW (ref 4.22–5.81)
RDW: 14.2 % (ref 11.5–15.5)
WBC: 8 10*3/uL (ref 4.0–10.5)
nRBC: 0 % (ref 0.0–0.2)

## 2023-06-15 LAB — ABO/RH: ABO/RH(D): A POS

## 2023-06-15 SURGERY — ENDARTERECTOMY, FEMORAL
Anesthesia: General | Site: Leg Upper | Laterality: Right

## 2023-06-15 MED ORDER — LINAGLIPTIN 5 MG PO TABS
5.0000 mg | ORAL_TABLET | Freq: Every day | ORAL | Status: DC
  Administered 2023-06-15: 5 mg via ORAL
  Filled 2023-06-15: qty 1

## 2023-06-15 MED ORDER — METOPROLOL TARTRATE 5 MG/5ML IV SOLN: 2.0000 mg | INTRAVENOUS | Status: DC | PRN

## 2023-06-15 MED ORDER — HYDROMORPHONE HCL 1 MG/ML IJ SOLN: 0.5000 mg | INTRAMUSCULAR | Status: DC | PRN

## 2023-06-15 MED ORDER — ACETAMINOPHEN 325 MG PO TABS: 325.0000 mg | ORAL_TABLET | ORAL | Status: DC | PRN

## 2023-06-15 MED ORDER — GLIPIZIDE ER 10 MG PO TB24
10.0000 mg | ORAL_TABLET | Freq: Every day | ORAL | Status: DC
  Administered 2023-06-16: 10 mg via ORAL
  Filled 2023-06-15: qty 1

## 2023-06-15 MED ORDER — LISINOPRIL-HYDROCHLOROTHIAZIDE 20-25 MG PO TABS: 1.0000 | ORAL_TABLET | Freq: Every day | ORAL | Status: DC

## 2023-06-15 MED ORDER — FESOTERODINE FUMARATE ER 4 MG PO TB24
4.0000 mg | ORAL_TABLET | Freq: Every day | ORAL | Status: DC
  Filled 2023-06-15: qty 1

## 2023-06-15 MED ORDER — HEPARIN 6000 UNIT IRRIGATION SOLUTION
Status: AC
  Filled 2023-06-15: qty 500

## 2023-06-15 MED ORDER — PROPOFOL 10 MG/ML IV BOLUS
INTRAVENOUS | Status: DC | PRN
  Administered 2023-06-15: 150 mg via INTRAVENOUS

## 2023-06-15 MED ORDER — OXYCODONE HCL 5 MG PO TABS
5.0000 mg | ORAL_TABLET | ORAL | Status: DC | PRN
Start: 2023-06-15 — End: 2023-06-16

## 2023-06-15 MED ORDER — SIMVASTATIN 20 MG PO TABS
40.0000 mg | ORAL_TABLET | Freq: Every day | ORAL | Status: DC
  Administered 2023-06-15: 40 mg via ORAL
  Filled 2023-06-15: qty 2

## 2023-06-15 MED ORDER — ALUM & MAG HYDROXIDE-SIMETH 200-200-20 MG/5ML PO SUSP: 15.0000 mL | ORAL | Status: DC | PRN

## 2023-06-15 MED ORDER — POTASSIUM CHLORIDE CRYS ER 20 MEQ PO TBCR: 20.0000 meq | EXTENDED_RELEASE_TABLET | Freq: Every day | ORAL | Status: DC | PRN

## 2023-06-15 MED ORDER — HYDRALAZINE HCL 20 MG/ML IJ SOLN: 5.0000 mg | INTRAMUSCULAR | Status: DC | PRN

## 2023-06-15 MED ORDER — HEMOSTATIC AGENTS (NO CHARGE) OPTIME
TOPICAL | Status: DC | PRN
  Administered 2023-06-15: 1 via TOPICAL

## 2023-06-15 MED ORDER — SODIUM CHLORIDE 0.9 % IV SOLN: 500.0000 mL | Freq: Once | INTRAVENOUS | Status: DC | PRN

## 2023-06-15 MED ORDER — HEPARIN SODIUM (PORCINE) 1000 UNIT/ML IJ SOLN
INTRAMUSCULAR | Status: DC | PRN
  Administered 2023-06-15 (×2): 2000 [IU] via INTRAVENOUS
  Administered 2023-06-15: 5000 [IU] via INTRAVENOUS
  Administered 2023-06-15: 6000 [IU] via INTRAVENOUS

## 2023-06-15 MED ORDER — INSULIN ASPART 100 UNIT/ML IJ SOLN: 0.0000 [IU] | INTRAMUSCULAR | Status: DC | PRN

## 2023-06-15 MED ORDER — FENTANYL CITRATE (PF) 250 MCG/5ML IJ SOLN
INTRAMUSCULAR | Status: AC
  Filled 2023-06-15: qty 5

## 2023-06-15 MED ORDER — ACETAMINOPHEN 650 MG RE SUPP: 325.0000 mg | RECTAL | Status: DC | PRN

## 2023-06-15 MED ORDER — PHENOL 1.4 % MT LIQD: 1.0000 | OROMUCOSAL | Status: DC | PRN

## 2023-06-15 MED ORDER — FENTANYL CITRATE (PF) 250 MCG/5ML IJ SOLN
INTRAMUSCULAR | Status: DC | PRN
  Administered 2023-06-15 (×2): 25 ug via INTRAVENOUS
  Administered 2023-06-15: 100 ug via INTRAVENOUS

## 2023-06-15 MED ORDER — POLYETHYLENE GLYCOL 3350 17 G PO PACK: 17.0000 g | PACK | Freq: Every day | ORAL | Status: DC | PRN

## 2023-06-15 MED ORDER — DOCUSATE SODIUM 100 MG PO CAPS: 100.0000 mg | ORAL_CAPSULE | Freq: Every day | ORAL | Status: DC

## 2023-06-15 MED ORDER — SUGAMMADEX SODIUM 200 MG/2ML IV SOLN
INTRAVENOUS | Status: DC | PRN
  Administered 2023-06-15: 150 mg via INTRAVENOUS
  Administered 2023-06-15: 50 mg via INTRAVENOUS

## 2023-06-15 MED ORDER — METFORMIN HCL 500 MG PO TABS
1000.0000 mg | ORAL_TABLET | Freq: Two times a day (BID) | ORAL | Status: DC
  Administered 2023-06-15 – 2023-06-16 (×2): 1000 mg via ORAL
  Filled 2023-06-15 (×2): qty 2

## 2023-06-15 MED ORDER — PROTAMINE SULFATE 10 MG/ML IV SOLN
INTRAVENOUS | Status: DC | PRN
  Administered 2023-06-15: 10 mg via INTRAVENOUS
  Administered 2023-06-15: 40 mg via INTRAVENOUS

## 2023-06-15 MED ORDER — VANCOMYCIN HCL IN DEXTROSE 1-5 GM/200ML-% IV SOLN
1000.0000 mg | INTRAVENOUS | Status: AC
  Administered 2023-06-15: 1000 mg via INTRAVENOUS
  Filled 2023-06-15: qty 200

## 2023-06-15 MED ORDER — ASPIRIN 81 MG PO TBEC: 81.0000 mg | DELAYED_RELEASE_TABLET | Freq: Every day | ORAL | Status: DC

## 2023-06-15 MED ORDER — ROCURONIUM BROMIDE 10 MG/ML (PF) SYRINGE
PREFILLED_SYRINGE | INTRAVENOUS | Status: DC | PRN
  Administered 2023-06-15 (×4): 10 mg via INTRAVENOUS
  Administered 2023-06-15: 50 mg via INTRAVENOUS

## 2023-06-15 MED ORDER — MAGNESIUM SULFATE 2 GM/50ML IV SOLN: 2.0000 g | Freq: Every day | INTRAVENOUS | Status: DC | PRN

## 2023-06-15 MED ORDER — SODIUM CHLORIDE 0.9 % IV SOLN: INTRAVENOUS | Status: DC

## 2023-06-15 MED ORDER — DEXAMETHASONE SODIUM PHOSPHATE 10 MG/ML IJ SOLN
INTRAMUSCULAR | Status: DC | PRN
  Administered 2023-06-15 (×3): 5 mg via INTRAVENOUS

## 2023-06-15 MED ORDER — OMEGA-3-ACID ETHYL ESTERS 1 G PO CAPS
2.0000 | ORAL_CAPSULE | Freq: Two times a day (BID) | ORAL | Status: DC
  Administered 2023-06-15: 2 g via ORAL
  Filled 2023-06-15 (×2): qty 2

## 2023-06-15 MED ORDER — PROPOFOL 10 MG/ML IV BOLUS
INTRAVENOUS | Status: AC
  Filled 2023-06-15: qty 20

## 2023-06-15 MED ORDER — ONDANSETRON HCL 4 MG/2ML IJ SOLN
INTRAMUSCULAR | Status: DC | PRN
  Administered 2023-06-15: 4 mg via INTRAVENOUS

## 2023-06-15 MED ORDER — ACETAMINOPHEN 10 MG/ML IV SOLN: 1000.0000 mg | Freq: Once | INTRAVENOUS | Status: DC | PRN

## 2023-06-15 MED ORDER — LABETALOL HCL 5 MG/ML IV SOLN: 10.0000 mg | INTRAVENOUS | Status: DC | PRN

## 2023-06-15 MED ORDER — PANTOPRAZOLE SODIUM 40 MG PO TBEC
40.0000 mg | DELAYED_RELEASE_TABLET | Freq: Every day | ORAL | Status: DC
  Administered 2023-06-15: 40 mg via ORAL
  Filled 2023-06-15: qty 1

## 2023-06-15 MED ORDER — HYDROCHLOROTHIAZIDE 25 MG PO TABS
25.0000 mg | ORAL_TABLET | Freq: Every day | ORAL | Status: DC
  Administered 2023-06-15: 25 mg via ORAL
  Filled 2023-06-15: qty 1

## 2023-06-15 MED ORDER — IODIXANOL 320 MG/ML IV SOLN
INTRAVENOUS | Status: DC | PRN
  Administered 2023-06-15: 5 mL via INTRA_ARTERIAL

## 2023-06-15 MED ORDER — 0.9 % SODIUM CHLORIDE (POUR BTL) OPTIME
TOPICAL | Status: DC | PRN
  Administered 2023-06-15: 1000 mL

## 2023-06-15 MED ORDER — LISINOPRIL 20 MG PO TABS
20.0000 mg | ORAL_TABLET | Freq: Every day | ORAL | Status: DC
  Administered 2023-06-15: 20 mg via ORAL
  Filled 2023-06-15: qty 1

## 2023-06-15 MED ORDER — ONDANSETRON HCL 4 MG/2ML IJ SOLN: 4.0000 mg | Freq: Four times a day (QID) | INTRAMUSCULAR | Status: DC | PRN

## 2023-06-15 MED ORDER — PHENYLEPHRINE HCL-NACL 20-0.9 MG/250ML-% IV SOLN
INTRAVENOUS | Status: DC | PRN
  Administered 2023-06-15: 80 ug via INTRAVENOUS
  Administered 2023-06-15: 30 ug/min via INTRAVENOUS

## 2023-06-15 MED ORDER — LIDOCAINE 2% (20 MG/ML) 5 ML SYRINGE
INTRAMUSCULAR | Status: DC | PRN
  Administered 2023-06-15: 60 mg via INTRAVENOUS

## 2023-06-15 MED ORDER — HEPARIN SODIUM (PORCINE) 5000 UNIT/ML IJ SOLN
5000.0000 [IU] | Freq: Three times a day (TID) | INTRAMUSCULAR | Status: DC
Start: 2023-06-16 — End: 2023-06-16
  Administered 2023-06-16: 5000 [IU] via SUBCUTANEOUS
  Filled 2023-06-15 (×2): qty 1

## 2023-06-15 MED ORDER — CHLORHEXIDINE GLUCONATE CLOTH 2 % EX PADS: 6.0000 | MEDICATED_PAD | Freq: Once | CUTANEOUS | Status: DC

## 2023-06-15 MED ORDER — EPHEDRINE SULFATE-NACL 50-0.9 MG/10ML-% IV SOSY
PREFILLED_SYRINGE | INTRAVENOUS | Status: DC | PRN
  Administered 2023-06-15: 5 mg via INTRAVENOUS

## 2023-06-15 MED ORDER — FENOFIBRATE 160 MG PO TABS: 160.0000 mg | ORAL_TABLET | Freq: Every day | ORAL | Status: DC

## 2023-06-15 MED ORDER — HEPARIN 6000 UNIT IRRIGATION SOLUTION
Status: DC | PRN
  Administered 2023-06-15: 1

## 2023-06-15 MED ORDER — CEFAZOLIN SODIUM-DEXTROSE 2-4 GM/100ML-% IV SOLN
2.0000 g | Freq: Three times a day (TID) | INTRAVENOUS | Status: AC
  Administered 2023-06-15 (×2): 2 g via INTRAVENOUS
  Filled 2023-06-15 (×2): qty 100

## 2023-06-15 MED ORDER — CHLORHEXIDINE GLUCONATE 0.12 % MT SOLN
15.0000 mL | Freq: Once | OROMUCOSAL | Status: AC
  Administered 2023-06-15: 15 mL via OROMUCOSAL
  Filled 2023-06-15: qty 15

## 2023-06-15 MED ORDER — ALBUMIN HUMAN 5 % IV SOLN: INTRAVENOUS | Status: DC | PRN

## 2023-06-15 MED ORDER — ORAL CARE MOUTH RINSE: 15.0000 mL | Freq: Once | OROMUCOSAL | Status: AC

## 2023-06-15 MED ORDER — FENTANYL CITRATE (PF) 100 MCG/2ML IJ SOLN: 25.0000 ug | INTRAMUSCULAR | Status: DC | PRN

## 2023-06-15 MED ORDER — GUAIFENESIN-DM 100-10 MG/5ML PO SYRP: 15.0000 mL | ORAL_SOLUTION | ORAL | Status: DC | PRN

## 2023-06-15 MED ORDER — LACTATED RINGERS IV SOLN: INTRAVENOUS | Status: DC

## 2023-06-15 MED ORDER — INSULIN ASPART 100 UNIT/ML IJ SOLN
8.0000 [IU] | Freq: Once | INTRAMUSCULAR | Status: AC
  Administered 2023-06-15: 8 [IU] via SUBCUTANEOUS

## 2023-06-15 MED ORDER — CLOPIDOGREL BISULFATE 75 MG PO TABS
75.0000 mg | ORAL_TABLET | Freq: Every day | ORAL | Status: DC
Start: 2023-06-16 — End: 2023-06-16
  Administered 2023-06-16: 75 mg via ORAL
  Filled 2023-06-15 (×2): qty 1

## 2023-06-15 MED ORDER — TAMSULOSIN HCL 0.4 MG PO CAPS
0.4000 mg | ORAL_CAPSULE | Freq: Two times a day (BID) | ORAL | Status: DC
  Filled 2023-06-15: qty 1

## 2023-06-15 SURGICAL SUPPLY — 50 items
APL PRP STRL LF DISP 70% ISPRP (MISCELLANEOUS) ×3
APL SKNCLS STERI-STRIP NONHPOA (GAUZE/BANDAGES/DRESSINGS) ×6
BAG COUNTER SPONGE SURGICOUNT (BAG) ×3 IMPLANT
BAG SPNG CNTER NS LX DISP (BAG) ×3
BENZOIN TINCTURE PRP APPL 2/3 (GAUZE/BANDAGES/DRESSINGS) ×3 IMPLANT
CANISTER SUCT 3000ML PPV (MISCELLANEOUS) ×3 IMPLANT
CANNULA VESSEL 3MM 2 BLNT TIP (CANNULA) ×6 IMPLANT
CHLORAPREP W/TINT 26 (MISCELLANEOUS) ×3 IMPLANT
COVER DOME SNAP 22 D (MISCELLANEOUS) IMPLANT
DRAIN CHANNEL 15F RND FF W/TCR (WOUND CARE) IMPLANT
DRSG TEGADERM 4X10 (GAUZE/BANDAGES/DRESSINGS) IMPLANT
ELECT REM PT RETURN 9FT ADLT (ELECTROSURGICAL) ×3
ELECTRODE REM PT RTRN 9FT ADLT (ELECTROSURGICAL) ×3 IMPLANT
EVACUATOR SILICONE 100CC (DRAIN) IMPLANT
GAUZE SPONGE 4X4 12PLY STRL (GAUZE/BANDAGES/DRESSINGS) ×3 IMPLANT
GLOVE BIO SURGEON STRL SZ 6 (GLOVE) IMPLANT
GLOVE BIO SURGEON STRL SZ7 (GLOVE) IMPLANT
GLOVE BIO SURGEON STRL SZ8 (GLOVE) ×3 IMPLANT
GOWN STRL REUS W/ TWL LRG LVL3 (GOWN DISPOSABLE) ×6 IMPLANT
GOWN STRL REUS W/ TWL XL LVL3 (GOWN DISPOSABLE) ×3 IMPLANT
GOWN STRL REUS W/TWL LRG LVL3 (GOWN DISPOSABLE) ×6
GOWN STRL REUS W/TWL XL LVL3 (GOWN DISPOSABLE) ×3
HEMOSTAT SNOW SURGICEL 2X4 (HEMOSTASIS) IMPLANT
KIT BASIN OR (CUSTOM PROCEDURE TRAY) ×3 IMPLANT
KIT TURNOVER KIT B (KITS) ×3 IMPLANT
NS IRRIG 1000ML POUR BTL (IV SOLUTION) ×6 IMPLANT
PACK PERIPHERAL VASCULAR (CUSTOM PROCEDURE TRAY) ×3 IMPLANT
PAD ARMBOARD 7.5X6 YLW CONV (MISCELLANEOUS) ×6 IMPLANT
PATCH VASC XENOSURE 1X6 (Vascular Products) IMPLANT
PENCIL BUTTON HOLSTER BLD 10FT (ELECTRODE) IMPLANT
SET MICROPUNCTURE 5F STIFF (MISCELLANEOUS) IMPLANT
SET WALTER ACTIVATION W/DRAPE (SET/KITS/TRAYS/PACK) ×3 IMPLANT
STOPCOCK MORSE 400PSI 3WAY (MISCELLANEOUS) IMPLANT
STRIP CLOSURE SKIN 1/2X4 (GAUZE/BANDAGES/DRESSINGS) ×3 IMPLANT
SUT ETHILON 3 0 PS 1 (SUTURE) IMPLANT
SUT MNCRL AB 4-0 PS2 18 (SUTURE) ×3 IMPLANT
SUT PROLENE 5 0 C 1 24 (SUTURE) ×3 IMPLANT
SUT PROLENE 6 0 BV (SUTURE) ×3 IMPLANT
SUT VIC AB 2-0 CT1 27 (SUTURE) ×3
SUT VIC AB 2-0 CT1 TAPERPNT 27 (SUTURE) ×3 IMPLANT
SUT VIC AB 3-0 SH 27 (SUTURE) ×3
SUT VIC AB 3-0 SH 27X BRD (SUTURE) ×3 IMPLANT
SYR 10ML LL (SYRINGE) IMPLANT
SYR 20ML LL LF (SYRINGE) IMPLANT
TOWEL GREEN STERILE (TOWEL DISPOSABLE) ×6 IMPLANT
TOWEL GREEN STERILE FF (TOWEL DISPOSABLE) ×3 IMPLANT
TUBING CIL FLEX 10 FLL-RA (TUBING) IMPLANT
UNDERPAD 30X36 HEAVY ABSORB (UNDERPADS AND DIAPERS) ×3 IMPLANT
WATER STERILE IRR 1000ML POUR (IV SOLUTION) ×3 IMPLANT
WIRE TORQFLEX AUST .018X40CM (WIRE) IMPLANT

## 2023-06-15 NOTE — Anesthesia Procedure Notes (Signed)
Procedure Name: Intubation Date/Time: 06/15/2023 9:30 AM  Performed by: Loleta Jenniffer Vessels, CRNAPre-anesthesia Checklist: Patient identified, Patient being monitored, Timeout performed, Emergency Drugs available and Suction available Patient Re-evaluated:Patient Re-evaluated prior to induction Oxygen Delivery Method: Circle system utilized Preoxygenation: Pre-oxygenation with 100% oxygen Induction Type: IV induction Ventilation: Mask ventilation without difficulty Laryngoscope Size: Mac and 3 Grade View: Grade I Tube type: Oral Tube size: 7.0 mm Number of attempts: 1 Airway Equipment and Method: Stylet Placement Confirmation: ETT inserted through vocal cords under direct vision, positive ETCO2 and breath sounds checked- equal and bilateral Secured at: 23 cm Tube secured with: Tape Dental Injury: Teeth and Oropharynx as per pre-operative assessment

## 2023-06-15 NOTE — Anesthesia Postprocedure Evaluation (Signed)
Anesthesia Post Note  Patient: Michael Thomas  Procedure(s) Performed: RIGHT COMMON FEMORAL ENDARTERECTOMY (Right) PATCH ANGIOPLASTY USING XENOSURE 1X6CM (Right: Groin) INTRA OPERATIVE RIGHT LOWER EXTREMITY  ARTERIOGRAM (Right: Leg Upper)     Patient location during evaluation: PACU Anesthesia Type: General Level of consciousness: awake and alert Pain management: pain level controlled Vital Signs Assessment: post-procedure vital signs reviewed and stable Respiratory status: spontaneous breathing, nonlabored ventilation, respiratory function stable and patient connected to nasal cannula oxygen Cardiovascular status: blood pressure returned to baseline and stable Postop Assessment: no apparent nausea or vomiting Anesthetic complications: no   No notable events documented.  Last Vitals:  Vitals:   06/15/23 1500 06/15/23 1600  BP: 130/68 128/79  Pulse: 80 (!) 101  Resp: 15 18  Temp:  36.8 C  SpO2: 99% 98%    Last Pain:  Vitals:   06/15/23 1600  TempSrc: Oral  PainSc:                  Nelle Don Doretha Goding

## 2023-06-15 NOTE — Interval H&P Note (Signed)
History and Physical Interval Note:  06/15/2023 9:10 AM  Michael Thomas  has presented today for surgery, with the diagnosis of Atherosclerosis of native arteries of right lower extremity causing claudication.  The various methods of treatment have been discussed with the patient and family. After consideration of risks, benefits and other options for treatment, the patient has consented to  Procedure(s): RIGHT COMMON FEMORAL ENDARTERECTOMY (Right) as a surgical intervention.  The patient's history has been reviewed, patient examined, no change in status, stable for surgery.  I have reviewed the patient's chart and labs.  Questions were answered to the patient's satisfaction.     [name]

## 2023-06-15 NOTE — Progress Notes (Signed)
Patient reporting urge to urinate with Foley in place and requesting Foley be removed.  Patient urinating around Foley; possibly having bladder spasms.  Foley removed without difficulty and patient able to void in urinal post Foley removal.

## 2023-06-15 NOTE — Transfer of Care (Signed)
Immediate Anesthesia Transfer of Care Note  Patient: Michael Thomas  Procedure(s) Performed: RIGHT COMMON FEMORAL ENDARTERECTOMY (Right) PATCH ANGIOPLASTY USING XENOSURE 1X6CM (Right: Groin) INTRA OPERATIVE RIGHT LOWER EXTREMITY  ARTERIOGRAM (Right: Leg Upper)  Patient Location: PACU  Anesthesia Type:General  Level of Consciousness: awake and alert   Airway & Oxygen Therapy: Patient Spontanous Breathing  Post-op Assessment: Report given to RN, Post -op Vital signs reviewed and stable, and Patient moving all extremities X 4  Post vital signs: Reviewed and stable  Last Vitals:  Vitals Value Taken Time  BP 117/62 06/15/23 1212  Temp    Pulse 98 06/15/23 1214  Resp 15 06/15/23 1214  SpO2 92 % 06/15/23 1214  Vitals shown include unfiled device data.  Last Pain:  Vitals:   06/15/23 0748  TempSrc:   PainSc: 0-No pain      Patients Stated Pain Goal: 0 (06/15/23 0748)  Complications: No notable events documented.

## 2023-06-15 NOTE — Anesthesia Preprocedure Evaluation (Addendum)
Anesthesia Evaluation  Patient identified by MRN, date of birth, ID band Patient awake    Reviewed: Allergy & Precautions, NPO status , Patient's Chart, lab work & pertinent test results  Airway Mallampati: II  TM Distance: >3 FB Neck ROM: Full    Dental no notable dental hx.    Pulmonary sleep apnea    Pulmonary exam normal        Cardiovascular hypertension, Pt. on medications + Peripheral Vascular Disease   Rhythm:Regular Rate:Normal     Neuro/Psych negative neurological ROS  negative psych ROS   GI/Hepatic negative GI ROS, Neg liver ROS,,,  Endo/Other  diabetes, Type 2, Oral Hypoglycemic Agents    Renal/GU CRFRenal disease  negative genitourinary   Musculoskeletal  (+) Arthritis , Osteoarthritis,    Abdominal Normal abdominal exam  (+)   Peds  Hematology Lab Results      Component                Value               Date                      WBC                      6.2                 06/08/2023                HGB                      12.0 (L)            06/08/2023                HCT                      36.8 (L)            06/08/2023                MCV                      86.2                06/08/2023                PLT                      373                 06/08/2023             Lab Results      Component                Value               Date                      NA                       140                 06/08/2023                K  5.0                 06/08/2023                CO2                      23                  06/08/2023                GLUCOSE                  105 (H)             06/08/2023                BUN                      39 (H)              06/08/2023                CREATININE               1.77 (H)            06/08/2023                CALCIUM                  9.8                 06/08/2023                EGFR                     43 (L)               09/06/2022                GFRNONAA                 39 (L)              06/08/2023              Anesthesia Other Findings   Reproductive/Obstetrics                             Anesthesia Physical Anesthesia Plan  ASA: 3  Anesthesia Plan: General   Post-op Pain Management:    Induction: Intravenous  PONV Risk Score and Plan: 2 and Ondansetron, Dexamethasone and Treatment may vary due to age or medical condition  Airway Management Planned: Mask and Oral ETT  Additional Equipment: Arterial line  Intra-op Plan:   Post-operative Plan: Extubation in OR  Informed Consent: I have reviewed the patients History and Physical, chart, labs and discussed the procedure including the risks, benefits and alternatives for the proposed anesthesia with the patient or authorized representative who has indicated his/her understanding and acceptance.     Dental advisory given  Plan Discussed with: CRNA  Anesthesia Plan Comments:        Anesthesia Quick Evaluation

## 2023-06-15 NOTE — Op Note (Addendum)
DATE OF SERVICE: 06/15/2023  PATIENT:  Michael Thomas  77 y.o. male  PRE-OPERATIVE DIAGNOSIS:  atherosclerosis of native arteries of right lower extremity causing claudication  POST-OPERATIVE DIAGNOSIS:  Same  PROCEDURE:   1) right femoral endarterectomy and bovine pericardial patch angioplasty 2) right lower extremity angiogram  SURGEON:  Surgeons and Role:    * [name], MD - Primary  ASSISTANT: Loel Dubonnet, PA-C  An experienced assistant was required given the complexity of this procedure and the standard of surgical care. My assistant helped with exposure through counter tension, suctioning, ligation and retraction to better visualize the surgical field.  My assistant expedited sewing during the case by following my sutures. Wherever I use the term "we" in the report, my assistant actively helped me with that portion of the procedure.  ANESTHESIA:   general  EBL:  BLOOD ADMINISTERED:none  DRAINS: none   LOCAL MEDICATIONS USED:  NONE  SPECIMEN:  none  COUNTS: confirmed correct.  TOURNIQUET:  none  PATIENT DISPOSITION:  PACU - hemodynamically stable.   Delay start of Pharmacological VTE agent (>24hrs) due to surgical blood loss or risk of bleeding: no  INDICATION FOR PROCEDURE: Michael Thomas is a 77 y.o. male with right lower extremity atherosclerosis causing claudication which is bothersome to him.  He underwent a trial of medical therapy without improvement. After careful discussion of risks, benefits, and alternatives the patient was offered right femoral endarterectomy. The patient understood and wished to proceed.  OPERATIVE FINDINGS: Good technical result from endarterectomy.  Completion angiogram shows no inflow issues, patent endarterectomy into the profunda femoris artery.  The superficial femoral artery is patent for several centimeters before it occludes in a stented segment.  DESCRIPTION OF PROCEDURE: After identification of the  patient in the pre-operative holding area, the patient was transferred to the operating room. The patient was positioned pine on the operating room table. Anesthesia was induced. The right groin and thigh were prepped and draped in standard fashion. A surgical pause was performed confirming correct patient, procedure, and operative location.  An oblique incision was made in the groin and carried down through the subcutaneous tissue until the femoral sheath was encountered.  This was incised longitudinally carefully with the Bovie lecture cautery.  The common femoral artery was skeletonized.  The profunda femoris artery was encircled with Silastic vessel loop.  The superficial femoral artery was encircled with Silastic vessel loop.  Exposure was carried cranially under the inguinal ligament.  The circumflex iliac vein was identified, ligated, and divided.  The external iliac artery was encircled above the epigastric branches.  The patient was systemically heparinized.  Activated clotting time measurements were used at the case to confirm adequate anticoagulation.  A Henley clamp was applied to the external iliac artery.  Silastic Vesseloops were applied to the epigastric and circumflex iliac arteries.  Silastic Vesseloops were applied to the superficial femoral and profunda femoris arteries.  An anterior arteriotomy was made with an 11 blade and extended with Potts scissors.  An endarterectomy plane was developed with a Astronomer and carried through the exposure.  Extensive plaque was noted at the orifice of the profunda femoris artery and superficial femoral artery.  Several centimeters of additional exposure on the superficial femoral artery was required to get a satisfactory endpoint.  Good endpoint was achieved in the profunda femoris artery.  Brisk backbleeding was achieved for the profunda femoris artery.  Modest backbleeding was achieved from the superficial femoral artery.  A  bovine pericardial patch  was brought onto the field and prepared per manufacturer's instructions.  This was sewn to the arteriotomy using continuous running suture of 5-0 Prolene.  Prior to completion the repair was flushed and de-aired.  The repair was evaluated with Doppler.  I did heard a "waterhammer" type signal over the superficial femoral artery.  Good signal was heard elsewhere in the repair.  I elected to perform an on table angiogram.  Micropuncture access was obtained in the patch angioplasty.  Angiogram was performed of the repair.  This evaluated the inflow, profunda femoris artery, and superficial femoral artery.  See above for details.  Satisfied we ended the case here.  The sheath was removed and a figure-of-eight stitch placed to render hemostasis.  Hemostasis was confirmed in the patch and the surgical bed.  The wound was closed in layers using 2-0 Vicryl, 3-0 Vicryl, 4-0 Monocryl.  Upon completion of the case instrument and sharps counts were confirmed correct. The patient was transferred to the PACU in good condition. I was present for all portions of the procedure.  FOLLOW UP PLAN: Assuming a normal postoperative course, VVS PA will see the patient in 4 weeks with ABI.   Rande Brunt. Lenell Antu, MD [facility] Phone Number: [phone] 06/15/2023 4:46 PM

## 2023-06-15 NOTE — Anesthesia Procedure Notes (Signed)
Arterial Line Insertion Performed by: Jakwon Gayton B, CRNA, CRNA  Patient location: Pre-op. Preanesthetic checklist: patient identified, IV checked, site marked, risks and benefits discussed, surgical consent, monitors and equipment checked, pre-op evaluation, timeout performed and anesthesia consent Lidocaine 1% used for infiltration Left, radial was placed Catheter size: 20 G Hand hygiene performed , maximum sterile barriers used  and Seldinger technique used Allen's test indicative of satisfactory collateral circulation Attempts: 1 Procedure performed without using ultrasound guided technique. Following insertion, dressing applied and Biopatch. Post procedure assessment: normal  Patient tolerated the procedure well with no immediate complications.    

## 2023-06-16 ENCOUNTER — Encounter (HOSPITAL_COMMUNITY): Payer: Self-pay | Admitting: Vascular Surgery

## 2023-06-16 ENCOUNTER — Other Ambulatory Visit (HOSPITAL_COMMUNITY): Payer: Self-pay

## 2023-06-16 LAB — CBC
HCT: 26.1 % — ABNORMAL LOW (ref 39.0–52.0)
Hemoglobin: 8.8 g/dL — ABNORMAL LOW (ref 13.0–17.0)
MCH: 28.4 pg (ref 26.0–34.0)
MCHC: 33.7 g/dL (ref 30.0–36.0)
MCV: 84.2 fL (ref 80.0–100.0)
Platelets: 300 10*3/uL (ref 150–400)
RBC: 3.1 MIL/uL — ABNORMAL LOW (ref 4.22–5.81)
RDW: 14.3 % (ref 11.5–15.5)
WBC: 7.8 10*3/uL (ref 4.0–10.5)
nRBC: 0 % (ref 0.0–0.2)

## 2023-06-16 LAB — BASIC METABOLIC PANEL
Anion gap: 8 (ref 5–15)
BUN: 34 mg/dL — ABNORMAL HIGH (ref 8–23)
CO2: 22 mmol/L (ref 22–32)
Calcium: 9.1 mg/dL (ref 8.9–10.3)
Chloride: 105 mmol/L (ref 98–111)
Creatinine, Ser: 1.66 mg/dL — ABNORMAL HIGH (ref 0.61–1.24)
GFR, Estimated: 42 mL/min — ABNORMAL LOW (ref >60)
Glucose, Bld: 178 mg/dL — ABNORMAL HIGH (ref 70–99)
Potassium: 3.9 mmol/L (ref 3.5–5.1)
Sodium: 135 mmol/L (ref 135–145)

## 2023-06-16 LAB — LIPID PANEL
Cholesterol: 109 mg/dL (ref 0–200)
HDL: 26 mg/dL — ABNORMAL LOW (ref >40)
LDL Cholesterol: 59 mg/dL (ref 0–99)
Total CHOL/HDL Ratio: 4.2 {ratio}
Triglycerides: 118 mg/dL (ref <150)
VLDL: 24 mg/dL (ref 0–40)

## 2023-06-16 MED ORDER — OXYCODONE HCL 5 MG PO TABS
5.0000 mg | ORAL_TABLET | Freq: Four times a day (QID) | ORAL | 0 refills | Status: DC | PRN
  Filled 2023-06-16: qty 20, 5d supply, fill #0

## 2023-06-16 MED ORDER — CLOPIDOGREL BISULFATE 75 MG PO TABS
75.0000 mg | ORAL_TABLET | Freq: Every day | ORAL | 1 refills | Status: DC
  Filled 2023-06-16: qty 30, 30d supply, fill #0

## 2023-06-16 NOTE — Evaluation (Signed)
Occupational Therapy Evaluation Patient Details Name: Michael Thomas MRN: [id number] DOB: 12-Mar-1946 Today's Date: 06/16/2023   History of Present Illness Patient is a 77 year old male s/p R CFA endarterectomy with bovine patch angioplasty   Clinical Impression   At baseline, pt is Independent with ADLs, IADLs, and functional mobility without an AD. Pt is within 95% of baseline PLOF with Supervision to Contact guard assist recommended by OT for LB bathing/dressing and bed mobility at this time for safety and comfort due to surgical site pain. Pt reports his wife is able to provide supervision/assistance as needed with pt also reporting no further concerns or questions. No equipment needs identified. No further benefit from acute skilled OT services and no post acute OT follow up indicated at this time. OT is signing off.       If plan is discharge home, recommend the following: A little help with walking and/or transfers;A little help with bathing/dressing/bathroom;Assistance with cooking/housework;Assist for transportation;Help with stairs or ramp for entrance    Functional Status Assessment  Patient has not had a recent decline in their functional status  Equipment Recommendations  None recommended by OT    Recommendations for Other Services       Precautions / Restrictions Precautions Precautions: Fall Restrictions Weight Bearing Restrictions: No      Mobility Bed Mobility Overal bed mobility: Needs Assistance Bed Mobility: Supine to Sit, Sit to Supine     Supine to sit: Contact guard Sit to supine: Contact guard assist   General bed mobility comments: Largely Mod I with occasional CGA due to pain    Transfers Overall transfer level: Modified independent Equipment used: None                      Balance Overall balance assessment: No apparent balance deficits (not formally assessed)                                         ADL  either performed or assessed with clinical judgement   ADL Overall ADL's : Modified independent;Needs assistance/impaired Eating/Feeding: Independent;Sitting   Grooming: Modified independent;Standing   Upper Body Bathing: Modified independent;Sitting   Lower Body Bathing: Supervison/ safety;Contact guard assist;Sitting/lateral leans;Sit to/from stand Lower Body Bathing Details (indicate cue type and reason): largely Supervision with CGA for washing/drying feet due to pain Upper Body Dressing : Independent;Sitting   Lower Body Dressing: Supervision/safety;Contact guard assist;Sitting/lateral leans;Sit to/from stand Lower Body Dressing Details (indicate cue type and reason): largely Supervision with CGA for donning/doffing socks and shoes due to pain Toilet Transfer: Modified Independent;Regular Toilet   Toileting- Clothing Manipulation and Hygiene: Modified independent;Sitting/lateral lean;Sit to/from stand       Functional mobility during ADLs: Modified independent General ADL Comments: Pt is within 95% of baseline PLOF with Supervision to CGA recommended by OT for LB bathing/dressing at this time for safety and comfort due to surgical site pain. Pt reports wife is able to provide supervision/assistance for LB ADLs as needed.     Vision Ability to See in Adequate Light: 0 Adequate Patient Visual Report: No change from baseline       Perception         Praxis         Pertinent Vitals/Pain Pain Assessment Pain Assessment: Faces Faces Pain Scale: Hurts little more (4 with bed mobility, 2 in standing/walking, 0 at rest) Pain  Location: R groin with movement Pain Descriptors / Indicators: Discomfort, Grimacing Pain Intervention(s): Limited activity within patient's tolerance, Monitored during session, Repositioned     Extremity/Trunk Assessment Upper Extremity Assessment Upper Extremity Assessment: Right hand dominant;Overall [facility] for tasks assessed   Lower Extremity  Assessment Lower Extremity Assessment: Defer to PT evaluation       Communication Communication Communication: No apparent difficulties   Cognition Arousal: Alert Behavior During Therapy: WFL for tasks assessed/performed Overall Cognitive Status: Within Functional Limits for tasks assessed                                       General Comments       Exercises     Shoulder Instructions      Home Living Family/patient expects to be discharged to:: Private residence Living Arrangements: Spouse/significant other Available Help at Discharge: Family Type of Home: House Home Access: Stairs to enter     Home Layout: Two level;1/2 bath on main level;Bed/bath upstairs Alternate Level Stairs-Number of Steps: flight   Bathroom Shower/Tub: Tub/shower unit;Walk-in shower   Bathroom Toilet: Standard     Home Equipment: None          Prior Functioning/Environment Prior Level of Function : Independent/Modified Independent             Mobility Comments: Independent without an AD ADLs Comments: Independent with all ADLs and IADLs        OT Problem List:        OT Treatment/Interventions:      OT Goals(Current goals can be found in the care plan section) Acute Rehab OT Goals Patient Stated Goal: to return home and have less pain at surgical site  OT Frequency:      Co-evaluation              AM-PAC OT "6 Clicks" Daily Activity     Outcome Measure Help from another person eating meals?: None Help from another person taking care of personal grooming?: None Help from another person toileting, which includes using toliet, bedpan, or urinal?: None Help from another person bathing (including washing, rinsing, drying)?: A Little Help from another person to put on and taking off regular upper body clothing?: None Help from another person to put on and taking off regular lower body clothing?: A Little 6 Click Score: 22   End of Session Nurse  Communication: Mobility status;Other (comment) (No further benefit from skilled OT services at this time)  Activity Tolerance: Patient tolerated treatment well Patient left: in bed;with call bell/phone within reach;with nursing/sitter in room  OT Visit Diagnosis: Pain                Time: 2841-3244 OT Time Calculation (min): 13 min Charges:  OT General Charges $OT Visit: 1 Visit OT Evaluation $OT Eval Low Complexity: 1 Low  Tedra Coppernoll "Orson Eva., OTR/L, MA Acute Rehab [phone]   Lendon Colonel 06/16/2023, 9:42 AM

## 2023-06-16 NOTE — Evaluation (Signed)
Physical Therapy Evaluation and Discharge  Patient Details Name: Michael Thomas MRN: [id number] DOB: 10-08-1945 Today's Date: 06/16/2023  History of Present Illness  Patient is a 77 year old male s/p R CFA endarterectomy with bovine patch angioplasty  Clinical Impression  Patient is agreeable to PT evaluation. He reports he is independent with mobility prior to surgery and lives at home with his spouse.  Today, the patient complains of mild groin pain with mobility. He is modified independent (increased time) with all functional mobility including hallway ambulation without assistive device. Mobility is adequate for discharge home with family support. No anticipated PT needs at this time. PT will sign off.       If plan is discharge home, recommend the following: Assist for transportation   Can travel by private vehicle        Equipment Recommendations None recommended by PT  Recommendations for Other Services       Functional Status Assessment Patient has not had a recent decline in their functional status     Precautions / Restrictions Precautions Precautions: Fall (low fall risk) Restrictions Weight Bearing Restrictions: No      Mobility  Bed Mobility Overal bed mobility: Modified Independent                  Transfers Overall transfer level: Modified independent                      Ambulation/Gait Ambulation/Gait assistance: Modified independent (Device/Increase time) Gait Distance (Feet): 220 Feet Assistive device: None Gait Pattern/deviations: WFL(Within Functional Limits) Gait velocity: decreased     General Gait Details: patient ambulated in hallway without difficulty without assistive device. modified independent due to increased time. heart rate 90 after walking  Stairs Stairs:  (patient declined)          Wheelchair Mobility     Tilt Bed    Modified Rankin (Stroke Patients Only)       Balance Overall balance  assessment: No apparent balance deficits (not formally assessed)                                           Pertinent Vitals/Pain Pain Assessment Pain Assessment: Faces Faces Pain Scale: Hurts a little bit Pain Location: R groin with movement Pain Descriptors / Indicators: Discomfort Pain Intervention(s): Limited activity within patient's tolerance, Monitored during session    Home Living Family/patient expects to be discharged to:: Private residence Living Arrangements: Spouse/significant other Available Help at Discharge: Family Type of Home: House Home Access: Stairs to enter     Alternate Level Stairs-Number of Steps: flight Home Layout: Two level;1/2 bath on main level;Bed/bath upstairs        Prior Function Prior Level of Function : Independent/Modified Independent                     Extremity/Trunk Assessment   Upper Extremity Assessment Upper Extremity Assessment: Overall WFL for tasks assessed    Lower Extremity Assessment Lower Extremity Assessment: Overall WFL for tasks assessed       Communication   Communication Communication: No apparent difficulties  Cognition Arousal: Alert Behavior During Therapy: WFL for tasks assessed/performed Overall Cognitive Status: Within Functional Limits for tasks assessed  General Comments      Exercises     Assessment/Plan    PT Assessment Patient does not need any further PT services  PT Problem List         PT Treatment Interventions      PT Goals (Current goals can be found in the Care Plan section)  Acute Rehab PT Goals PT Goal Formulation: All assessment and education complete, DC therapy    Frequency       Co-evaluation               AM-PAC PT "6 Clicks" Mobility  Outcome Measure Help needed turning from your back to your side while in a flat bed without using bedrails?: None Help needed moving from  lying on your back to sitting on the side of a flat bed without using bedrails?: None Help needed moving to and from a bed to a chair (including a wheelchair)?: None Help needed standing up from a chair using your arms (e.g., wheelchair or bedside chair)?: None Help needed to walk in hospital room?: None Help needed climbing 3-5 steps with a railing? : None 6 Click Score: 24    End of Session   Activity Tolerance: Patient tolerated treatment well Patient left: in bed;with call bell/phone within reach Nurse Communication: Mobility status PT Visit Diagnosis: Difficulty in walking, not elsewhere classified (R26.2)    Time: 6578-4696 PT Time Calculation (min) (ACUTE ONLY): 12 min   Charges:   PT Evaluation $PT Eval Low Complexity: 1 Low   PT General Charges $$ ACUTE PT VISIT: 1 Visit         Donna Bernard, PT, MPT   Ina Homes 06/16/2023, 9:06 AM

## 2023-06-16 NOTE — Progress Notes (Signed)
Patient refused morning medications, wants to take it when he gets home.

## 2023-06-16 NOTE — Discharge Instructions (Signed)
 [facility]  Discharge instructions  Lower Extremity Bypass Surgery  Please refer to the following instruction for your post-procedure care. Your surgeon or physician assistant will discuss any changes with you.  Activity  You are encouraged to walk as much as you can. You can slowly return to normal activities during the month after your surgery. Avoid strenuous activity and heavy lifting until your doctor tells you it's OK. Avoid activities such as vacuuming or swinging a golf club. Do not drive until your doctor give the OK and you are no longer taking prescription pain medications. It is also normal to have difficulty with sleep habits, eating and bowel movement after surgery. These will go away with time.  Bathing/Showering  You may shower after you go home. Do not soak in a bathtub, hot tub, or swim until the incision heals completely.  Incision Care  Clean your incision with mild soap and water. Shower every day. Pat the area dry with a clean towel. You do not need a bandage unless otherwise instructed. Do not apply any ointments or creams to your incision. If you have open wounds you will be instructed how to care for them or a visiting nurse may be arranged for you. If you have staples or sutures along your incision they will be removed at your post-op appointment. You may have skin glue on your incision. Do not peel it off. It will come off on its own in about one week. If you have a great deal of moisture in your groin, use a gauze help keep this area dry.  Diet  Resume your normal diet. There are no special food restrictions following this procedure. A low fat/ low cholesterol diet is recommended for all patients with vascular disease. In order to heal from your surgery, it is CRITICAL to get adequate nutrition. Your body requires vitamins, minerals, and protein. Vegetables are the best source of vitamins and minerals. Vegetables also provide the  perfect balance of protein. Processed food has little nutritional value, so try to avoid this.  Medications  Resume taking all your medications unless your doctor or nurse practitioner tells you not to. If your incision is causing pain, you may take over-the-counter pain relievers such as acetaminophen (Tylenol). If you were prescribed a stronger pain medication, please aware these medication can cause nausea and constipation. Prevent nausea by taking the medication with a snack or meal. Avoid constipation by drinking plenty of fluids and eating foods with high amount of fiber, such as fruits, vegetables, and grains. Take Colase 100 mg (an over-the-counter stool softener) twice a day as needed for constipation. Do not take Tylenol if you are taking prescription pain medications.  Follow Up  Our office will schedule a follow up appointment 2-3 weeks following discharge.  Please call us immediately for any of the following conditions  Severe or worsening pain in your legs or feet while at rest or while walking Increase pain, redness, warmth, or drainage (pus) from your incision site(s) Fever of 101 degree or higher The swelling in your leg with the bypass suddenly worsens and becomes more painful than when you were in the hospital If you have been instructed to feel your graft pulse then you should do so every day. If you can no longer feel this pulse, call the office immediately. Not all patients are given this instruction.  Leg swelling is common after leg bypass surgery.  The swelling should improve over a few months   following surgery. To improve the swelling, you may elevate your legs above the level of your heart while you are sitting or resting. Your surgeon or physician assistant may ask you to apply an ACE wrap or wear compression (TED) stockings to help to reduce swelling.  Reduce your risk of vascular disease  Stop smoking. If you would like help call [facility] at 1-800-QUIT-NOW  (1-800-784-8669) or [place] at 336-586-4000.  Manage your cholesterol Maintain a desired weight Control your diabetes weight Control your diabetes Keep your blood pressure down  If you have any questions, please call the office at [phone]   

## 2023-06-16 NOTE — Progress Notes (Signed)
  Progress Note    06/16/2023 7:46 AM 1 Day Post-Op  Subjective:  ready for discharge   Vitals:   06/15/23 2257 06/16/23 0300  BP: 133/75 119/67  Pulse: 100 85  Resp: 20 16  Temp: 98 F (36.7 C) 98.8 F (37.1 C)  SpO2: 98% 98%   Physical Exam: Lungs:  non labored  Incisions:  R groin c/d/i Extremities:  brisk R PT by doppler Neurologic: A&O  CBC    Component Value Date/Time   WBC 7.8 06/16/2023 0500   RBC 3.10 (L) 06/16/2023 0500   HGB 8.8 (L) 06/16/2023 0500   HCT 26.1 (L) 06/16/2023 0500   PLT 300 06/16/2023 0500   MCV 84.2 06/16/2023 0500   MCH 28.4 06/16/2023 0500   MCHC 33.7 06/16/2023 0500   RDW 14.3 06/16/2023 0500   LYMPHSABS 1.1 10/13/2022 1021   MONOABS 0.5 10/13/2022 1021   EOSABS 0.2 10/13/2022 1021   BASOSABS 0.0 10/13/2022 1021    BMET    Component Value Date/Time   NA 135 06/16/2023 0500   K 3.9 06/16/2023 0500   CL 105 06/16/2023 0500   CO2 22 06/16/2023 0500   GLUCOSE 178 (H) 06/16/2023 0500   BUN 34 (H) 06/16/2023 0500   CREATININE 1.66 (H) 06/16/2023 0500   CREATININE 1.62 (H) 09/06/2022 0919   CALCIUM 9.1 06/16/2023 0500   GFRNONAA 42 (L) 06/16/2023 0500   GFRNONAA 47 (L) 04/04/2018 1354   GFRAA 59 (L) 05/07/2020 0759   GFRAA 55 (L) 04/04/2018 1354    INR    Component Value Date/Time   INR 1.1 06/08/2023 1530     Intake/Output Summary (Last 24 hours) at 06/16/2023 0746 Last data filed at 06/16/2023 0500 Gross per 24 hour  Intake 1800.56 ml  Output 2710 ml  Net -909.44 ml     Assessment/Plan:  77 y.o. male is s/p R CFA endarterectomy with bovine patch angioplasty 1 Day Post-Op   R foot well perfused on exam Groin incision is well appearing Ok for discharge home if walking and voiding Continue aspirin and plavix   Emilie Rutter, PA-C [facility] [phone] 06/16/2023 7:46 AM

## 2023-06-16 NOTE — TOC Transition Note (Signed)
Transition of Care (TOC) - CM/SW Discharge Note Donn Pierini RN, BSN Transitions of Care Unit 4E- RN Case Manager See Treatment Team for direct phone #   Patient Details  Name: Michael Thomas MRN: [id number] Date of Birth: 09-Feb-1946  Transition [facility] CM/SW Contact:  Darrold Span, RN Phone Number: 06/16/2023, 11:16 AM   Clinical Narrative:    Pt stable for transition home today, family to transport home.   CM was notified by [facility] liaison that [facility] office made referral for [facility] needs. [facility] to follow up post discharge with [facility] office protocol for [facility]. Liaison aware of discharge home today  No further TOC needs noted.    Final next level of care: Home w Home Health Services Barriers to Discharge: No Barriers Identified   Patient Goals and CMS Choice CMS Medicare.gov Compare Post Acute Care list provided to::  ([facility] office referral)    Discharge Placement                 Home w/ [facility]        Discharge Plan and Services Additional resources added to the After Visit Summary for                  DME Arranged: N/A DME Agency: NA         HH Agency: [facility] Date HH Agency Contacted: 06/16/23 Time HH Agency Contacted: 0930 Representative spoke with at [facility] Agency: Clarise Cruz  Social Determinants of Health (SDOH) Interventions SDOH Screenings   Food Insecurity: No Food Insecurity (04/23/2022)  Housing: Low Risk  (04/23/2022)  Transportation Needs: No Transportation Needs (04/23/2022)  Depression (PHQ2-9): Low Risk  (06/01/2023)  Tobacco Use: Low Risk  (06/15/2023)     Readmission Risk Interventions    06/16/2023   11:16 AM  Readmission Risk Prevention Plan  Post Dischage Appt Complete  Medication Screening Complete  Transportation Screening Complete

## 2023-06-16 NOTE — Progress Notes (Signed)
Discharge instructions given. Patient verbalized understanding and all questions were answered.  ?

## 2023-06-16 NOTE — Progress Notes (Incomplete)
PHARMACIST LIPID MONITORING   Michael Thomas is a 77 y.o. male admitted on 06/15/2023 with PAD.  Pharmacy has been consulted to optimize lipid-lowering therapy with the indication of secondary prevention for clinical ASCVD.  Recent Labs:  Lipid Panel (last 6 months):   Lab Results  Component Value Date   CHOL 109 06/16/2023   TRIG 118 06/16/2023   HDL 26 (L) 06/16/2023   CHOLHDL 4.2 06/16/2023   VLDL 24 06/16/2023   LDLCALC 59 06/16/2023    Hepatic function panel (last 6 months):   Lab Results  Component Value Date   AST 20 06/08/2023   ALT 20 06/08/2023   ALKPHOS 27 (L) 06/08/2023   BILITOT 0.5 06/08/2023    SCr (since admission):   Serum creatinine: 1.66 mg/dL (H) 16/10/96 0454 Estimated creatinine clearance: 26.7 mL/min (A)  Current therapy and lipid therapy tolerance Current lipid-lowering therapy: simvastatin 40 mg  Previous lipid-lowering therapies (if applicable): *** Documented or reported allergies or intolerances to lipid-lowering therapies (if applicable): ***  Assessment:   {Assessment:23889}  Plan:    1.Statin intensity (high intensity recommended for all patients regardless of the LDL):  {Statin therapy:25220}  2.Add ezetimibe (if any one of the following):   {Ezetimibe therapy:25221}  3.Refer to lipid clinic:   {Lipid clinic referral:25222}  4.Follow-up with:  {Lipid monitoring follow-up:23891}  5.Follow-up labs after discharge:  {Follow-up labs:25219}       Ishmael Holter, PharmD 06/16/2023, 9:43 AM

## 2023-06-16 NOTE — Discharge Summary (Signed)
Discharge Summary  Patient ID: Michael Thomas [id number] 77 y.o. 05/04/1946  Admit date: 06/15/2023  Discharge date and time: 06/16/2023 10:07 AM   Admitting Physician: [name], MD   Discharge Physician: same  Admission Diagnoses: PAD (peripheral artery disease) (HCC) [I73.9] Critical limb ischemia of right lower extremity (HCC) [I70.221]  Discharge Diagnoses: same  Admission Condition: fair  Discharged Condition: fair  Indication for Admission: post op care  Hospital Course: Mr. Michael Thomas is a 77 year old male who was brought in as an outpatient and underwent right femoral endarterectomy with bovine pericardial patch angioplasty by Dr. Lenell Antu on 06/15/2023 due to lifestyle limiting claudication of the right lower extremity.  He tolerated the procedure well and was admitted to the hospital postoperatively.  POD #1 the right groin incision was well-appearing.  He also had a brisk right PT signal by Doppler.  Patient was feeling ready for discharge home and was able to void after catheter removal.  He was started on Plavix and will take this daily in addition to his aspirin and statin.  He was prescribed 2 to 3 days of narcotic pain medication for continued postoperative pain control.  He will follow-up in office in about 2 weeks for incision check.  He was discharged home in stable condition.  Consults: None  Treatments: surgery: Right femoral endarterectomy and bovine patch angioplasty  Discharge Exam: See progress note 06/16/23 Vitals:   06/16/23 0300 06/16/23 0801  BP: 119/67 139/65  Pulse: 85 81  Resp: 16 16  Temp: 98.8 F (37.1 C) 98 F (36.7 C)  SpO2: 98% 97%     Disposition: Discharge disposition: 01-Home or Self Care       Patient Instructions:  Allergies as of 06/16/2023       Reactions   Amoxicillin Other (See Comments)   Fatigue and Myalgia and Rigors x2 attempts        Medication List     TAKE these medications    AMBULATORY  NON FORMULARY MEDICATION One Touch Ultra 2 glucometer   aspirin EC 81 MG tablet Take 81 mg by mouth at bedtime.   clobetasol ointment 0.05 % Commonly known as: TEMOVATE APPLY TOPICALLY TWO TIMES A DAY AS NEEDED   clopidogrel 75 MG tablet Commonly known as: PLAVIX Take 1 tablet (75 mg total) by mouth daily at 6 (six) AM. Start taking on: June 17, 2023   fenofibrate 160 MG tablet Take 1 tablet (160 mg total) by mouth daily.   glipiZIDE 10 MG 24 hr tablet Commonly known as: GLUCOTROL XL TAKE 1 TABLET BY MOUTH EVERY DAY WITH BREAKFAST   lisinopril-hydrochlorothiazide 20-25 MG tablet Commonly known as: ZESTORETIC TAKE 1 TABLET DAILY.   metFORMIN 1000 MG tablet Commonly known as: GLUCOPHAGE TAKE 1 TABLET (1,000 MG TOTAL) BY MOUTH TWICE A DAY WITH FOOD   omega-3 acid ethyl esters 1 g capsule Commonly known as: LOVAZA TAKE 2 CAPSULES BY MOUTH TWICE A DAY   OneTouch Delica Plus Lancet33G Misc USE UP TO 3 TIMES DAILY.   OneTouch Verio Reflect w/Device Kit Check fasting blood sugar every morning and 2 hours after largest meal of the day.,   OneTouch Verio test strip Generic drug: glucose blood CHECK FASTING BLOOD SUGAR EVERY MORNING AND 2 HOURS AFTER LARGEST MEAL OF THE DAY.,   oxyCODONE 5 MG immediate release tablet Commonly known as: Oxy IR/ROXICODONE Take 1 tablet (5 mg total) by mouth every 6 (six) hours as needed for moderate pain (pain score 4-6).  simvastatin 40 MG tablet Commonly known as: ZOCOR TAKE 1 TABLET BY MOUTH EVERYDAY AT BEDTIME   sitaGLIPtin 100 MG tablet Commonly known as: Januvia Take 1 tablet (100 mg total) by mouth daily.   solifenacin 5 MG tablet Commonly known as: VESICARE Take 1 tablet (5 mg total) by mouth daily.   tamsulosin 0.4 MG Caps capsule Commonly known as: FLOMAX TAKE ONE CAPSULE BY MOUTH TWICE A DAY       Activity: activity as tolerated Diet: regular diet Wound Care: keep wound clean and dry  Follow-up with [facility] in 2  weeks.  Signed: Emilie Rutter, PA-C 06/16/2023 3:13 PM [facility] Office: [phone]

## 2023-06-17 LAB — POCT I-STAT 7, (LYTES, BLD GAS, ICA,H+H)
Acid-base deficit: 5 mmol/L — ABNORMAL HIGH (ref 0.0–2.0)
Bicarbonate: 19.3 mmol/L — ABNORMAL LOW (ref 20.0–28.0)
Calcium, Ion: 1.24 mmol/L (ref 1.15–1.40)
HCT: 30 % — ABNORMAL LOW (ref 39.0–52.0)
Hemoglobin: 10.2 g/dL — ABNORMAL LOW (ref 13.0–17.0)
O2 Saturation: 100 %
Potassium: 4.7 mmol/L (ref 3.5–5.1)
Sodium: 136 mmol/L (ref 135–145)
TCO2: 20 mmol/L — ABNORMAL LOW (ref 22–32)
pCO2 arterial: 32.4 mm[Hg] (ref 32–48)
pH, Arterial: 7.382 (ref 7.35–7.45)
pO2, Arterial: 209 mm[Hg] — ABNORMAL HIGH (ref 83–108)

## 2023-06-17 LAB — POCT ACTIVATED CLOTTING TIME
Activated Clotting Time: 233 s
Activated Clotting Time: 210 s
Activated Clotting Time: 251 s
Activated Clotting Time: 268 s

## 2023-06-19 ENCOUNTER — Telehealth: Payer: Self-pay | Admitting: *Deleted

## 2023-06-19 NOTE — Transitions of Care (Post Inpatient/ED Visit) (Addendum)
06/19/2023  Name: Michael Thomas MRN: [id number] DOB: 04/23/1946  Today's TOC FU Call Status: Today's TOC FU Call Status:: Successful TOC FU Call Completed TOC FU Call Complete Date: 06/19/23 Patient's Name and Date of Birth confirmed.  Transition Care Management Follow-up Telephone Call Date of Discharge: 06/16/23 Discharge Facility: [facility]) Type of Discharge: Inpatient Admission Primary Inpatient Discharge Diagnosis:: peripheral artery disease) How have you been since you were released from the hospital?: Better Any questions or concerns?: No  Items Reviewed: Did you receive and understand the discharge instructions provided?: Yes Medications obtained,verified, and reconciled?: Yes (Medications Reviewed) Any new allergies since your discharge?: No Dietary orders reviewed?: No Do you have support at home?: Yes People in Home: spouse Name of Support/Comfort Primary Source: [name]  Medications Reviewed Today: Medications Reviewed Today     Reviewed by Luella Cook, RN (Case Manager) on 06/19/23 at 1215  Med List Status: <None>   Medication Order Taking? Sig Documenting Provider Last Dose Status Informant  AMBULATORY NON FORMULARY MEDICATION [id number] Yes One Touch Ultra 2 glucometer Monica Becton, MD Taking Active Self  aspirin EC 81 MG tablet [id number] Yes Take 81 mg by mouth at bedtime. [provider] Taking Active Self           Med Note Kai Levins, MELISSA B   Wed Feb 14, 2018  4:22 PM)    Blood Glucose Monitoring Suppl (ONETOUCH VERIO REFLECT) w/Device KIT [id number] Yes Check fasting blood sugar every morning and 2 hours after largest meal of the day., Monica Becton, MD Taking Active Self  clobetasol ointment (TEMOVATE) 0.05 % [id number] Yes APPLY TOPICALLY TWO TIMES A DAY AS NEEDED Monica Becton, MD Taking Active Self  clopidogrel (PLAVIX) 75 MG tablet [id number] Yes Take 1 tablet (75 mg total) by mouth daily at 6 (six)  AM. Emilie Rutter, PA-C Taking Active   fenofibrate 160 MG tablet [id number] Yes Take 1 tablet (160 mg total) by mouth daily. Monica Becton, MD Taking Active Self  glipiZIDE (GLUCOTROL XL) 10 MG 24 hr tablet [id number] Yes TAKE 1 TABLET BY MOUTH EVERY DAY WITH BREAKFAST Monica Becton, MD Taking Active Self  Lancets [facility] DELICA PLUS [place]) MISC [id number] Yes USE UP TO 3 TIMES DAILY. Monica Becton, MD Taking Active Self  lisinopril-hydrochlorothiazide (ZESTORETIC) 20-25 MG tablet [id number] Yes TAKE 1 TABLET DAILY. Monica Becton, MD Taking Active Self  metFORMIN (GLUCOPHAGE) 1000 MG tablet [id number] Yes TAKE 1 TABLET (1,000 MG TOTAL) BY MOUTH TWICE A DAY WITH FOOD Monica Becton, MD Taking Active Self  omega-3 acid ethyl esters (LOVAZA) 1 g capsule [id number] Yes TAKE 2 CAPSULES BY MOUTH TWICE A DAY Monica Becton, MD Taking Active Self  [facility] VERIO test strip [id number] Yes CHECK FASTING BLOOD SUGAR EVERY MORNING AND 2 HOURS AFTER LARGEST MEAL OF THE DAY., Monica Becton, MD Taking Active Self  oxyCODONE (OXY IR/ROXICODONE) 5 MG immediate release tablet [id number] Yes Take 1 tablet (5 mg total) by mouth every 6 (six) hours as needed for moderate pain (pain score 4-6). Emilie Rutter, PA-C Taking Active   simvastatin (ZOCOR) 40 MG tablet [id number] Yes TAKE 1 TABLET BY MOUTH EVERYDAY AT BEDTIME Monica Becton, MD Taking Active Self  sitaGLIPtin (JANUVIA) 100 MG tablet [id number] Yes Take 1 tablet (100 mg total) by mouth daily. Monica Becton, MD Taking Active Self  solifenacin (VESICARE) 5 MG tablet [id number] Yes Take 1 tablet (5 mg total) by mouth daily. Benjamin Stain,  Ihor Austin, MD Taking Active Self  tamsulosin (FLOMAX) 0.4 MG CAPS capsule [id number] Yes TAKE ONE CAPSULE BY MOUTH TWICE A DAY Monica Becton, MD Taking Active Self            Home Care and Equipment/Supplies: Were Home Health Services  Ordered?: Yes Name of Home Health Agency:: [facility] Has Agency set up a time to come to your home?: No EMR reviewed for Home Health Orders:  (per patient [facility] had to get approval from [facility]. Patient has now declined service) Any new equipment or medical supplies ordered?: NA  Functional Questionnaire: Do you need assistance with bathing/showering or dressing?: No Do you need assistance with meal preparation?: No Do you need assistance with eating?: No Do you have difficulty maintaining continence: No Do you need assistance with getting out of bed/getting out of a chair/moving?: No Do you have difficulty managing or taking your medications?: No  Follow up appointments reviewed: PCP Follow-up appointment confirmed?: NA Specialist Hospital Follow-up appointment confirmed?: Yes Date of Specialist follow-up appointment?: 06/24/23 Follow-Up Specialty Provider::  Vascular and imaging Do you need transportation to your follow-up appointment?: No Do you understand care options if your condition(s) worsen?: Yes-patient verbalized understanding RN discussed wound care and activity. Patient has declined further outreach calls.   Gean Maidens BSN RN Population Health- Transition of Care Team.  [facility] [phone]

## 2023-07-03 ENCOUNTER — Ambulatory Visit
Admission: RE | Admit: 2023-07-03 | Discharge: 2023-07-03 | Disposition: A | Discharge status: Home / Self Care | Payer: PPO | Source: Ambulatory Visit | Attending: Sports Medicine | Admitting: Sports Medicine

## 2023-07-03 DIAGNOSIS — M47816 Spondylosis without myelopathy or radiculopathy, lumbar region: Secondary | ICD-10-CM | POA: Diagnosis not present

## 2023-07-03 DIAGNOSIS — M5137 Other intervertebral disc degeneration, lumbosacral region with discogenic back pain only: Secondary | ICD-10-CM | POA: Diagnosis not present

## 2023-07-03 DIAGNOSIS — M5416 Radiculopathy, lumbar region: Secondary | ICD-10-CM | POA: Insufficient documentation

## 2023-07-03 DIAGNOSIS — M5136 Other intervertebral disc degeneration, lumbar region with discogenic back pain only: Secondary | ICD-10-CM | POA: Diagnosis not present

## 2023-07-03 DIAGNOSIS — N281 Cyst of kidney, acquired: Secondary | ICD-10-CM | POA: Diagnosis not present

## 2023-07-03 NOTE — Progress Notes (Unsigned)
[facility] [place]  ASSESSMENT / PLAN: Michael Thomas is a 77 y.o. male with atherosclerosis of native arteries of right lower extremity causing intermittent claudication.  Recommend the following which can slow the progression of atherosclerosis and reduce the risk of major adverse cardiac / limb events:  Complete cessation from all tobacco products. Blood glucose control with goal A1c < 7%. Blood pressure control with goal blood pressure < 140/90 mmHg. Lipid reduction therapy with goal LDL-C <100 mg/dL (<16 if symptomatic from PAD).  Aspirin 81mg  PO QD.  Atorvastatin 40-80mg  PO QD (or other "high intensity" statin therapy).  Duplex suggests common femoral and SFA disease. Will plan for diagnostic angiogram to evaluate if a femoral endarterectomy or bypass would be most efficacious.   CHIEF COMPLAINT: Claudication  HISTORY OF PRESENT ILLNESS: Michael Thomas is a 77 y.o. male referred from Dr. Loreta Ave for discussion of surgical options for intermittent claudication in bilateral lower extremities.  The patient is undergone multiple endovascular interventions for lifestyle limiting claudication by Dr. Loreta Ave.  He reports good symptom relief in the short-term after these interventions, but gradual return to baseline level of discomfort afterwards.  He has a good amount of exercise tolerance from my perspective.  He reports he can walk on a flat surface for greater than a mile.  He has difficulty on incline surfaces.  He has difficulty jogging.  04/25/23: Patient returns to clinic for evaluation.  He describes fairly classic claudication symptoms in his right calf which are bothersome to him.  These began at about 50 yards.  This is interfering with his lifestyle.  He strongly desires intervention.  05/16/23: Patient returns to clinic to review CT angiogram. Unfortunately, his renal function did not permit this test, so we obtained duplex ultrasound of the aorta,  iliacs, and RLE. We reviewed these and developed a plan to do angiography next.  Past Medical History:  Diagnosis Date   Benign prostatic hyperplasia with urinary obstruction 03/25/2015   Chronic kidney disease    Diabetes mellitus without complication (HCC)    History of kidney stones    Hyperlipidemia    Hypertension    OSA (obstructive sleep apnea)    Vitamin D deficiency     Past Surgical History:  Procedure Laterality Date   ABDOMINAL AORTOGRAM W/LOWER EXTREMITY N/A 05/26/2023   Procedure: ABDOMINAL AORTOGRAM W/LOWER EXTREMITY;  Surgeon: [name], MD;  Location: [facility] INVASIVE [facility];  Service: Cardiovascular;  Laterality: N/A;   DG ARTHRO THUMB*L*     ENDARTERECTOMY FEMORAL Right 06/15/2023   Procedure: RIGHT COMMON FEMORAL ENDARTERECTOMY;  Surgeon: [name], MD;  Location: [facility] OR;  Service: Vascular;  Laterality: Right;   INTRAOPERATIVE ARTERIOGRAM Right 06/15/2023   Procedure: INTRA OPERATIVE RIGHT LOWER EXTREMITY  ARTERIOGRAM;  Surgeon: [name], MD;  Location: [facility] OR;  Service: Vascular;  Laterality: Right;   IR ANGIOGRAM EXTREMITY BILATERAL  02/20/2018   IR ANGIOGRAM EXTREMITY BILATERAL  04/05/2019   IR ANGIOGRAM EXTREMITY LEFT  05/07/2020   IR ANGIOGRAM EXTREMITY LEFT  10/13/2022   IR ANGIOGRAM EXTREMITY RIGHT  02/15/2021   IR ANGIOGRAM EXTREMITY RIGHT  12/14/2021   IR ANGIOGRAM SELECTIVE EACH ADDITIONAL VESSEL  02/20/2018   IR ANGIOGRAM SELECTIVE EACH ADDITIONAL VESSEL  10/13/2022   IR FEM POP ART ATHERECT INC PTA MOD SED  02/20/2018   IR FEM POP ART ATHERECT INC PTA MOD SED  05/14/2019   IR FEM POP ART ATHERECT INC PTA MOD SED  05/07/2020   IR FEM POP ART ATHERECT INC PTA MOD SED  02/15/2021   IR FEM POP ART STENT INC PTA MOD SED  12/14/2021   IR ILIAC ART STENT INC PTA MOD SED  10/13/2022   IR INTRAVASCULAR ULTRASOUND NON CORONARY  02/15/2021   IR INTRAVASCULAR ULTRASOUND NON CORONARY  12/14/2021   IR INTRAVASCULAR ULTRASOUND NON CORONARY  10/13/2022   IR RADIOLOGIST  EVAL & MGMT  02/01/2018   IR RADIOLOGIST EVAL & MGMT  03/20/2018   IR RADIOLOGIST EVAL & MGMT  07/25/2018   IR RADIOLOGIST EVAL & MGMT  03/07/2019   IR RADIOLOGIST EVAL & MGMT  04/23/2019   IR RADIOLOGIST EVAL & MGMT  06/18/2019   IR RADIOLOGIST EVAL & MGMT  04/29/2020   IR RADIOLOGIST EVAL & MGMT  06/04/2020   IR RADIOLOGIST EVAL & MGMT  01/20/2021   IR RADIOLOGIST EVAL & MGMT  04/27/2021   IR RADIOLOGIST EVAL & MGMT  08/12/2021   IR RADIOLOGIST EVAL & MGMT  11/02/2021   IR RADIOLOGIST EVAL & MGMT  11/15/2021   IR RADIOLOGIST EVAL & MGMT  01/26/2022   IR RADIOLOGIST EVAL & MGMT  03/23/2022   IR RADIOLOGIST EVAL & MGMT  09/23/2022   IR RADIOLOGIST EVAL & MGMT  11/25/2022   IR RADIOLOGIST EVAL & MGMT  03/22/2023   IR US GUIDE VASC ACCESS LEFT  04/05/2019   IR US GUIDE VASC ACCESS LEFT  02/15/2021   IR US GUIDE VASC ACCESS LEFT  12/14/2021   IR US GUIDE VASC ACCESS RIGHT  02/20/2018   IR US GUIDE VASC ACCESS RIGHT  05/14/2019   IR US GUIDE VASC ACCESS RIGHT  05/07/2020   IR US GUIDE VASC ACCESS RIGHT  10/13/2022   NECK SURGERY     PATCH ANGIOPLASTY Right 06/15/2023   Procedure: PATCH ANGIOPLASTY USING Livia Snellen 1X6CM;  Surgeon: [name], MD;  Location: [facility] OR;  Service: Vascular;  Laterality: Right;   VASECTOMY      Family History  Problem Relation Age of Onset   Stroke Mother    Diabetes Mellitus II Mother    Prostate cancer Neg Hx    Kidney cancer Neg Hx    Bladder Cancer Neg Hx     Social History   Socioeconomic History   Marital status: Married    Spouse name: Not on file   Number of children: Not on file   Years of education: Not on file   Highest education level: Not on file  Occupational History   Not on file  Tobacco Use   Smoking status: Never   Smokeless tobacco: Never  Vaping Use   Vaping status: Never Used  Substance and Sexual Activity   Alcohol use: No   Drug use: No   Sexual activity: Yes    Birth control/protection: None  Other Topics Concern   Not on file   Social History Narrative   Not on file   Social Determinants of Health   Financial Resource Strain: Not on file  Food Insecurity: No Food Insecurity (04/23/2022)   Hunger Vital Sign    Worried About Running Out of Food in the Last Year: Never true    Ran Out of Food in the Last Year: Never true  Transportation Needs: No Transportation Needs (04/23/2022)   PRAPARE - Administrator, Civil Service (Medical): No    Lack of Transportation (Non-Medical): No  Physical Activity: Not on file  Stress: Not on file  Social  Connections: Not on file  Intimate Partner Violence: Not At Risk (04/23/2022)   Humiliation, Afraid, Rape, and Kick questionnaire    Fear of Current or Ex-Partner: No    Emotionally Abused: No    Physically Abused: No    Sexually Abused: No    Allergies  Allergen Reactions   Amoxicillin Other (See Comments)    Fatigue and Myalgia and Rigors x2 attempts    Current Outpatient Medications  Medication Sig Dispense Refill   AMBULATORY NON FORMULARY MEDICATION One Touch Ultra 2 glucometer 1 each 0   aspirin EC 81 MG tablet Take 81 mg by mouth at bedtime.     Blood Glucose Monitoring Suppl (ONETOUCH VERIO REFLECT) w/Device KIT Check fasting blood sugar every morning and 2 hours after largest meal of the day., 1 kit 0   clobetasol ointment (TEMOVATE) 0.05 % APPLY TOPICALLY TWO TIMES A DAY AS NEEDED 45 g 2   clopidogrel (PLAVIX) 75 MG tablet Take 1 tablet (75 mg total) by mouth daily at 6 (six) AM. 30 tablet 1   fenofibrate 160 MG tablet Take 1 tablet (160 mg total) by mouth daily. 90 tablet 3   glipiZIDE (GLUCOTROL XL) 10 MG 24 hr tablet TAKE 1 TABLET BY MOUTH EVERY DAY WITH BREAKFAST 90 tablet 3   Lancets (ONETOUCH DELICA PLUS LANCET33G) MISC USE UP TO 3 TIMES DAILY. 100 each 11   lisinopril-hydrochlorothiazide (ZESTORETIC) 20-25 MG tablet TAKE 1 TABLET DAILY. 90 tablet 3   metFORMIN (GLUCOPHAGE) 1000 MG tablet TAKE 1 TABLET (1,000 MG TOTAL) BY MOUTH TWICE A DAY  WITH FOOD 180 tablet 1   omega-3 acid ethyl esters (LOVAZA) 1 g capsule TAKE 2 CAPSULES BY MOUTH TWICE A DAY 360 capsule 3   ONETOUCH VERIO test strip CHECK FASTING BLOOD SUGAR EVERY MORNING AND 2 HOURS AFTER LARGEST MEAL OF THE DAY., 100 strip 12   oxyCODONE (OXY IR/ROXICODONE) 5 MG immediate release tablet Take 1 tablet (5 mg total) by mouth every 6 (six) hours as needed for moderate pain (pain score 4-6). 20 tablet 0   simvastatin (ZOCOR) 40 MG tablet TAKE 1 TABLET BY MOUTH EVERYDAY AT BEDTIME 90 tablet 0   sitaGLIPtin (JANUVIA) 100 MG tablet Take 1 tablet (100 mg total) by mouth daily. 90 tablet 3   solifenacin (VESICARE) 5 MG tablet Take 1 tablet (5 mg total) by mouth daily.     tamsulosin (FLOMAX) 0.4 MG CAPS capsule TAKE ONE CAPSULE BY MOUTH TWICE A DAY 180 capsule 3   No current facility-administered medications for this visit.    PHYSICAL EXAM There were no vitals filed for this visit.     Constitutional: well appearing. no distress. Appears well nourished.  Neurologic: CN intact. no focal findings. no sensory loss. Psychiatric:  Mood and affect symmetric and appropriate. Eyes:  No icterus. No conjunctival pallor. Ears, nose, throat:  mucous membranes moist. Midline trachea.  Cardiac: regular rate and rhythm.  Respiratory:  unlabored. Abdominal:  soft, non-tender, non-distended.  Peripheral vascular: 2+ L DP / PT pulse. Absent R DP / PT pulse Extremity: no edema. no cyanosis. no pallor.  Skin: no gangrene. no ulceration.  Lymphatic: no Stemmer's sign. no palpable lymphadenopathy.  PERTINENT LABORATORY AND RADIOLOGIC DATA  Most recent CBC    Latest Ref Rng & Units 06/16/2023    5:00 AM 06/15/2023    4:03 PM 06/15/2023   11:33 AM  CBC  WBC 4.0 - 10.5 K/uL 7.8  8.0    Hemoglobin 13.0 - 17.0  g/dL 8.8  8.9  01.0   Hematocrit 39.0 - 52.0 % 26.1  26.5  30.0   Platelets 150 - 400 K/uL 300  307       Most recent CMP    Latest Ref Rng & Units 06/16/2023    5:00 AM  06/15/2023    4:03 PM 06/15/2023   11:33 AM  CMP  Glucose 70 - 99 mg/dL 932     BUN 8 - 23 mg/dL 34     Creatinine 3.55 - 1.24 mg/dL 7.32  2.02    Sodium 542 - 145 mmol/L 135   136   Potassium 3.5 - 5.1 mmol/L 3.9   4.7   Chloride 98 - 111 mmol/L 105     CO2 22 - 32 mmol/L 22     Calcium 8.9 - 10.3 mg/dL 9.1       Renal function CrCl cannot be calculated (Unknown ideal weight.).  Hemoglobin A1C (%)  Date Value  06/01/2023 7.9 (A)   HbA1c, POC (controlled diabetic range) (%)  Date Value  02/16/2022 7.7 (A)   Hgb A1c MFr Bld (% of total Hgb)  Date Value  09/06/2022 8.1 (H)    LDL Cholesterol (Calc)  Date Value Ref Range Status  09/06/2022 81 mg/dL (calc) Final    Comment:    Reference range: <100 . Desirable range <100 mg/dL for primary prevention;   <70 mg/dL for patients with CHD or diabetic patients  with > or = 2 CHD risk factors. Marland Kitchen LDL-C is now calculated using the Martin-Hopkins  calculation, which is a validated novel method providing  better accuracy than the Friedewald equation in the  estimation of LDL-C.  Horald Pollen et al. Lenox Ahr. 7062;376(28): 2061-2068  (http://education.[facility].com/faq/FAQ164)    LDL Cholesterol  Date Value Ref Range Status  06/16/2023 59 0 - 99 mg/dL Final    Comment:           Total Cholesterol/HDL:CHD Risk Coronary Heart Disease Risk Table                     Men   Women  1/2 Average Risk   3.4   3.3  Average Risk       5.0   4.4  2 X Average Risk   9.6   7.1  3 X Average Risk  23.4   11.0        Use the calculated Patient Ratio above and the CHD Risk Table to determine the patient's CHD Risk.        ATP III CLASSIFICATION (LDL):  <100     mg/dL   Optimal  315-176  mg/dL   Near or Above                    Optimal  130-159  mg/dL   Borderline  160-737  mg/dL   High  >106     mg/dL   Very High Performed at [facility], [address] [place], [place] [address]    Direct LDL  Date Value Ref Range  Status  07/25/2018 58 <100 mg/dL Final    Comment:    . Desirable range <100 mg/dL for primary prevention;   <70 mg/dL for patients with CHD or diabetic patients  with > or = 2 CHD risk factors. .     Prior right lower extremity angiogram from 12/14/21 reviewed CFA stenosis noted. Good technical result from SFA intervention.  Rande Brunt. Lenell Antu, MD  [facility] Phone Number: [phone] 07/03/2023 12:19 PM  Total time spent on preparing this encounter including chart review, data review, collecting history, examining the patient, coordinating care for this established patient, 30 minutes.   Portions of this report may have been transcribed using voice recognition software.  Every effort has been made to ensure accuracy; however, inadvertent computerized transcription errors may still be present.

## 2023-07-03 NOTE — H&P (View-Only) (Signed)
VASCULAR [facility] VEIN SPECIALISTS OF   ASSESSMENT / PLAN: Michael Thomas is a 77 y.o. male with atherosclerosis of native arteries of right lower extremity causing intermittent claudication.  Recommend the following which can slow the progression of atherosclerosis and reduce the risk of major adverse cardiac / limb events:  Complete cessation from all tobacco products. Blood glucose control with goal A1c < 7%. Blood pressure control with goal blood pressure < 140/90 mmHg. Lipid reduction therapy with goal LDL-C <100 mg/dL (<81 if symptomatic from PAD).  Aspirin 81mg  PO QD.  Atorvastatin 40-80mg  PO QD (or other "high intensity" statin therapy).  Duplex shows open common femoral and profunda femoris artery, but occluded superficial femoral artery.  This is consistent with intraoperative duplex.  Not sure why symptoms have worsened, but we will repeat angiography with attempt to recanalize the superficial femoral artery.  CHIEF COMPLAINT: Claudication  HISTORY OF PRESENT ILLNESS: Michael Thomas is a 77 y.o. male referred from Dr. Loreta Ave for discussion of surgical options for intermittent claudication in bilateral lower extremities.  The patient is undergone multiple endovascular interventions for lifestyle limiting claudication by Dr. Loreta Ave.  He reports good symptom relief in the short-term after these interventions, but gradual return to baseline level of discomfort afterwards.  He has a good amount of exercise tolerance from my perspective.  He reports he can walk on a flat surface for greater than a mile.  He has difficulty on incline surfaces.  He has difficulty jogging.  04/25/23: Patient returns to clinic for evaluation.  He describes fairly classic claudication symptoms in his right calf which are bothersome to him.  These began at about 50 yards.  This is interfering with his lifestyle.  He strongly desires intervention.  05/16/23: Patient returns to clinic to review CT  angiogram. Unfortunately, his renal function did not permit this test, so we obtained duplex ultrasound of the aorta, iliacs, and RLE. We reviewed these and developed a plan to do angiography next.  07/04/23: Patient returns to clinic.  He initially felt better after surgery, but now reports claudication symptoms are worse.  His incision has healed appropriately.  Past Medical History:  Diagnosis Date   Benign prostatic hyperplasia with urinary obstruction 03/25/2015   Chronic kidney disease    Diabetes mellitus without complication (HCC)    History of kidney stones    Hyperlipidemia    Hypertension    OSA (obstructive sleep apnea)    Vitamin D deficiency     Past Surgical History:  Procedure Laterality Date   ABDOMINAL AORTOGRAM W/LOWER EXTREMITY N/A 05/26/2023   Procedure: ABDOMINAL AORTOGRAM W/LOWER EXTREMITY;  Surgeon: [name], MD;  Location: [facility];  Service: Cardiovascular;  Laterality: N/A;   DG ARTHRO THUMB*L*     ENDARTERECTOMY FEMORAL Right 06/15/2023   Procedure: RIGHT COMMON FEMORAL ENDARTERECTOMY;  Surgeon: [name], MD;  Location: [facility] OR;  Service: Vascular;  Laterality: Right;   INTRAOPERATIVE ARTERIOGRAM Right 06/15/2023   Procedure: INTRA OPERATIVE RIGHT LOWER EXTREMITY  ARTERIOGRAM;  Surgeon: [name], MD;  Location: [facility] OR;  Service: Vascular;  Laterality: Right;   IR ANGIOGRAM EXTREMITY BILATERAL  02/20/2018   IR ANGIOGRAM EXTREMITY BILATERAL  04/05/2019   IR ANGIOGRAM EXTREMITY LEFT  05/07/2020   IR ANGIOGRAM EXTREMITY LEFT  10/13/2022   IR ANGIOGRAM EXTREMITY RIGHT  02/15/2021   IR ANGIOGRAM EXTREMITY RIGHT  12/14/2021   IR ANGIOGRAM SELECTIVE EACH ADDITIONAL VESSEL  02/20/2018   IR ANGIOGRAM SELECTIVE EACH  ADDITIONAL VESSEL  10/13/2022   IR FEM POP ART ATHERECT INC PTA MOD SED  02/20/2018   IR FEM POP ART ATHERECT INC PTA MOD SED  05/14/2019   IR FEM POP ART ATHERECT INC PTA MOD SED  05/07/2020   IR FEM POP ART ATHERECT INC PTA MOD SED   02/15/2021   IR FEM POP ART STENT INC PTA MOD SED  12/14/2021   IR ILIAC ART STENT INC PTA MOD SED  10/13/2022   IR INTRAVASCULAR ULTRASOUND NON CORONARY  02/15/2021   IR INTRAVASCULAR ULTRASOUND NON CORONARY  12/14/2021   IR INTRAVASCULAR ULTRASOUND NON CORONARY  10/13/2022   IR RADIOLOGIST EVAL & MGMT  02/01/2018   IR RADIOLOGIST EVAL & MGMT  03/20/2018   IR RADIOLOGIST EVAL & MGMT  07/25/2018   IR RADIOLOGIST EVAL & MGMT  03/07/2019   IR RADIOLOGIST EVAL & MGMT  04/23/2019   IR RADIOLOGIST EVAL & MGMT  06/18/2019   IR RADIOLOGIST EVAL & MGMT  04/29/2020   IR RADIOLOGIST EVAL & MGMT  06/04/2020   IR RADIOLOGIST EVAL & MGMT  01/20/2021   IR RADIOLOGIST EVAL & MGMT  04/27/2021   IR RADIOLOGIST EVAL & MGMT  08/12/2021   IR RADIOLOGIST EVAL & MGMT  11/02/2021   IR RADIOLOGIST EVAL & MGMT  11/15/2021   IR RADIOLOGIST EVAL & MGMT  01/26/2022   IR RADIOLOGIST EVAL & MGMT  03/23/2022   IR RADIOLOGIST EVAL & MGMT  09/23/2022   IR RADIOLOGIST EVAL & MGMT  11/25/2022   IR RADIOLOGIST EVAL & MGMT  03/22/2023   IR US GUIDE VASC ACCESS LEFT  04/05/2019   IR US GUIDE VASC ACCESS LEFT  02/15/2021   IR US GUIDE VASC ACCESS LEFT  12/14/2021   IR US GUIDE VASC ACCESS RIGHT  02/20/2018   IR US GUIDE VASC ACCESS RIGHT  05/14/2019   IR US GUIDE VASC ACCESS RIGHT  05/07/2020   IR US GUIDE VASC ACCESS RIGHT  10/13/2022   NECK SURGERY     PATCH ANGIOPLASTY Right 06/15/2023   Procedure: PATCH ANGIOPLASTY USING Livia Snellen 1X6CM;  Surgeon: [name], MD;  Location: [facility] OR;  Service: Vascular;  Laterality: Right;   VASECTOMY      Family History  Problem Relation Age of Onset   Stroke Mother    Diabetes Mellitus II Mother    Prostate cancer Neg Hx    Kidney cancer Neg Hx    Bladder Cancer Neg Hx     Social History   Socioeconomic History   Marital status: Married    Spouse name: Not on file   Number of children: Not on file   Years of education: Not on file   Highest education level: Not on file  Occupational History    Not on file  Tobacco Use   Smoking status: Never   Smokeless tobacco: Never  Vaping Use   Vaping status: Never Used  Substance and Sexual Activity   Alcohol use: No   Drug use: No   Sexual activity: Yes    Birth control/protection: None  Other Topics Concern   Not on file  Social History Narrative   Not on file   Social Determinants of Health   Financial Resource Strain: Not on file  Food Insecurity: No Food Insecurity (04/23/2022)   Hunger Vital Sign    Worried About Running Out of Food in the Last Year: Never true    Ran Out of Food in the Last Year: Never  true  Transportation Needs: No Transportation Needs (04/23/2022)   PRAPARE - Administrator, Civil Service (Medical): No    Lack of Transportation (Non-Medical): No  Physical Activity: Not on file  Stress: Not on file  Social Connections: Not on file  Intimate Partner Violence: Not At Risk (04/23/2022)   Humiliation, Afraid, Rape, and Kick questionnaire    Fear of Current or Ex-Partner: No    Emotionally Abused: No    Physically Abused: No    Sexually Abused: No    Allergies  Allergen Reactions   Amoxicillin Other (See Comments)    Fatigue and Myalgia and Rigors x2 attempts    Current Outpatient Medications  Medication Sig Dispense Refill   AMBULATORY NON FORMULARY MEDICATION One Touch Ultra 2 glucometer 1 each 0   aspirin EC 81 MG tablet Take 81 mg by mouth at bedtime.     Blood Glucose Monitoring Suppl (ONETOUCH VERIO REFLECT) w/Device KIT Check fasting blood sugar every morning and 2 hours after largest meal of the day., 1 kit 0   clobetasol ointment (TEMOVATE) 0.05 % APPLY TOPICALLY TWO TIMES A DAY AS NEEDED 45 g 2   clopidogrel (PLAVIX) 75 MG tablet Take 1 tablet (75 mg total) by mouth daily at 6 (six) AM. 30 tablet 1   fenofibrate 160 MG tablet Take 1 tablet (160 mg total) by mouth daily. 90 tablet 3   glipiZIDE (GLUCOTROL XL) 10 MG 24 hr tablet TAKE 1 TABLET BY MOUTH EVERY DAY WITH BREAKFAST 90  tablet 3   Lancets (ONETOUCH DELICA PLUS LANCET33G) MISC USE UP TO 3 TIMES DAILY. 100 each 11   lisinopril-hydrochlorothiazide (ZESTORETIC) 20-25 MG tablet TAKE 1 TABLET DAILY. 90 tablet 3   metFORMIN (GLUCOPHAGE) 1000 MG tablet TAKE 1 TABLET (1,000 MG TOTAL) BY MOUTH TWICE A DAY WITH FOOD 180 tablet 1   omega-3 acid ethyl esters (LOVAZA) 1 g capsule TAKE 2 CAPSULES BY MOUTH TWICE A DAY 360 capsule 3   ONETOUCH VERIO test strip CHECK FASTING BLOOD SUGAR EVERY MORNING AND 2 HOURS AFTER LARGEST MEAL OF THE DAY., 100 strip 12   oxyCODONE (OXY IR/ROXICODONE) 5 MG immediate release tablet Take 1 tablet (5 mg total) by mouth every 6 (six) hours as needed for moderate pain (pain score 4-6). 20 tablet 0   simvastatin (ZOCOR) 40 MG tablet TAKE 1 TABLET BY MOUTH EVERYDAY AT BEDTIME 90 tablet 0   sitaGLIPtin (JANUVIA) 100 MG tablet Take 1 tablet (100 mg total) by mouth daily. 90 tablet 3   solifenacin (VESICARE) 5 MG tablet Take 1 tablet (5 mg total) by mouth daily.     tamsulosin (FLOMAX) 0.4 MG CAPS capsule TAKE ONE CAPSULE BY MOUTH TWICE A DAY 180 capsule 3   No current facility-administered medications for this visit.    PHYSICAL EXAM There were no vitals filed for this visit.     Constitutional: well appearing. no distress. Appears well nourished.  Neurologic: CN intact. no focal findings. no sensory loss. Psychiatric:  Mood and affect symmetric and appropriate. Eyes:  No icterus. No conjunctival pallor. Ears, nose, throat:  mucous membranes moist. Midline trachea.  Cardiac: regular rate and rhythm.  Respiratory:  unlabored. Abdominal:  soft, non-tender, non-distended.  Peripheral vascular: 2+ L DP / PT pulse. Absent R DP / PT pulse Extremity: no edema. no cyanosis. no pallor.  Skin: no gangrene. no ulceration.  Lymphatic: no Stemmer's sign. no palpable lymphadenopathy.  PERTINENT LABORATORY AND RADIOLOGIC DATA  Most recent CBC  Latest Ref Rng & Units 06/16/2023    5:00 AM 06/15/2023     4:03 PM 06/15/2023   11:33 AM  CBC  WBC 4.0 - 10.5 K/uL 7.8  8.0    Hemoglobin 13.0 - 17.0 g/dL 8.8  8.9  16.1   Hematocrit 39.0 - 52.0 % 26.1  26.5  30.0   Platelets 150 - 400 K/uL 300  307       Most recent CMP    Latest Ref Rng & Units 06/16/2023    5:00 AM 06/15/2023    4:03 PM 06/15/2023   11:33 AM  CMP  Glucose 70 - 99 mg/dL 096     BUN 8 - 23 mg/dL 34     Creatinine 0.45 - 1.24 mg/dL 4.09  8.11    Sodium 914 - 145 mmol/L 135   136   Potassium 3.5 - 5.1 mmol/L 3.9   4.7   Chloride 98 - 111 mmol/L 105     CO2 22 - 32 mmol/L 22     Calcium 8.9 - 10.3 mg/dL 9.1       Renal function CrCl cannot be calculated (Unknown ideal weight.).  Hemoglobin A1C (%)  Date Value  06/01/2023 7.9 (A)   HbA1c, POC (controlled diabetic range) (%)  Date Value  02/16/2022 7.7 (A)   Hgb A1c MFr Bld (% of total Hgb)  Date Value  09/06/2022 8.1 (H)    LDL Cholesterol (Calc)  Date Value Ref Range Status  09/06/2022 81 mg/dL (calc) Final    Comment:    Reference range: <100 . Desirable range <100 mg/dL for primary prevention;   <70 mg/dL for patients with CHD or diabetic patients  with > or = 2 CHD risk factors. Marland Kitchen LDL-C is now calculated using the Martin-Hopkins  calculation, which is a validated novel method providing  better accuracy than the Friedewald equation in the  estimation of LDL-C.  Horald Pollen et al. Lenox Ahr. 7829;562(13): 2061-2068  (http://education.[facility].com/faq/FAQ164)    LDL Cholesterol  Date Value Ref Range Status  06/16/2023 59 0 - 99 mg/dL Final    Comment:           Total Cholesterol/HDL:CHD Risk Coronary Heart Disease Risk Table                     Men   Women  1/2 Average Risk   3.4   3.3  Average Risk       5.0   4.4  2 X Average Risk   9.6   7.1  3 X Average Risk  23.4   11.0        Use the calculated Patient Ratio above and the CHD Risk Table to determine the patient's CHD Risk.        ATP III CLASSIFICATION (LDL):  <100     mg/dL    Optimal  086-578  mg/dL   Near or Above                    Optimal  130-159  mg/dL   Borderline  469-629  mg/dL   High  >528     mg/dL   Very High Performed at [facility], [address], [place], [place] [address]    Direct LDL  Date Value Ref Range Status  07/25/2018 58 <100 mg/dL Final    Comment:    . Desirable range <100 mg/dL for primary prevention;   <70 mg/dL for patients with  CHD or diabetic patients  with > or = 2 CHD risk factors. .     Duplex shows patent common femoral artery and profunda femoris artery  Rande Brunt. Lenell Antu, MD [facility] Specialists of [facility] Phone Number: [phone] 07/03/2023 12:19 PM  Total time spent on preparing this encounter including chart review, data review, collecting history, examining the patient, coordinating care for this established patient, 30 minutes.   Portions of this report may have been transcribed using voice recognition software.  Every effort has been made to ensure accuracy; however, inadvertent computerized transcription errors may still be present.

## 2023-07-04 ENCOUNTER — Encounter: Payer: Self-pay | Admitting: Vascular Surgery

## 2023-07-04 ENCOUNTER — Other Ambulatory Visit: Payer: Self-pay | Admitting: *Deleted

## 2023-07-04 ENCOUNTER — Ambulatory Visit (HOSPITAL_COMMUNITY)
Admission: RE | Admit: 2023-07-04 | Discharge: 2023-07-04 | Disposition: A | Discharge status: Home / Self Care | Payer: PPO | Source: Ambulatory Visit | Attending: Vascular Surgery | Admitting: Vascular Surgery

## 2023-07-04 ENCOUNTER — Ambulatory Visit (INDEPENDENT_AMBULATORY_CARE_PROVIDER_SITE_OTHER): Payer: PPO | Admitting: Vascular Surgery

## 2023-07-04 VITALS — BP 112/62 | HR 69 | Temp 98.0°F | Resp 20 | Ht 61.0 in | Wt 128.0 lb

## 2023-07-04 DIAGNOSIS — I70219 Atherosclerosis of native arteries of extremities with intermittent claudication, unspecified extremity: Secondary | ICD-10-CM | POA: Diagnosis not present

## 2023-07-04 DIAGNOSIS — I70211 Atherosclerosis of native arteries of extremities with intermittent claudication, right leg: Secondary | ICD-10-CM | POA: Diagnosis not present

## 2023-07-04 DIAGNOSIS — I739 Peripheral vascular disease, unspecified: Secondary | ICD-10-CM

## 2023-07-05 ENCOUNTER — Telehealth: Payer: Self-pay

## 2023-07-05 NOTE — Telephone Encounter (Signed)
Pt called to ask about AGM. This has not been scheduled yet. He is hoping to speak with MD to ask some specifics prior to his procedure. He is aware we will likely call him next week to schedule.

## 2023-07-10 ENCOUNTER — Other Ambulatory Visit: Payer: Self-pay

## 2023-07-10 ENCOUNTER — Telehealth: Payer: Self-pay

## 2023-07-10 DIAGNOSIS — I739 Peripheral vascular disease, unspecified: Secondary | ICD-10-CM

## 2023-07-10 DIAGNOSIS — I70219 Atherosclerosis of native arteries of extremities with intermittent claudication, unspecified extremity: Secondary | ICD-10-CM

## 2023-07-10 NOTE — Telephone Encounter (Signed)
Attempted to reach pt to schedule AGM. Left VM for him to return our call.

## 2023-07-13 ENCOUNTER — Other Ambulatory Visit: Payer: Self-pay | Admitting: Sports Medicine

## 2023-07-18 ENCOUNTER — Encounter (HOSPITAL_COMMUNITY): Payer: PPO

## 2023-07-20 ENCOUNTER — Encounter (HOSPITAL_COMMUNITY): Payer: PPO

## 2023-07-20 ENCOUNTER — Telehealth: Payer: Self-pay

## 2023-07-20 NOTE — Addendum Note (Signed)
Addended by: Monica Becton on: 07/20/2023 08:48 AM   Modules accepted: Orders

## 2023-07-20 NOTE — Telephone Encounter (Signed)
Pt called with c/o RLE numbness when he is lying down for about 2 weeks. He has to get up at night and walk around. MD made aware pt's AGM moved up to tomorrow. Pt notified.

## 2023-07-21 ENCOUNTER — Encounter (HOSPITAL_COMMUNITY)
Admission: RE | Disposition: A | Discharge status: Home / Self Care | Payer: Self-pay | Source: Home / Self Care | Attending: Vascular Surgery

## 2023-07-21 ENCOUNTER — Other Ambulatory Visit: Payer: Self-pay

## 2023-07-21 ENCOUNTER — Ambulatory Visit (HOSPITAL_COMMUNITY)
Admission: RE | Admit: 2023-07-21 | Discharge: 2023-07-21 | Disposition: A | Discharge status: Home / Self Care | Payer: PPO | Attending: Vascular Surgery | Admitting: Vascular Surgery

## 2023-07-21 DIAGNOSIS — E1122 Type 2 diabetes mellitus with diabetic chronic kidney disease: Secondary | ICD-10-CM | POA: Insufficient documentation

## 2023-07-21 DIAGNOSIS — I70221 Atherosclerosis of native arteries of extremities with rest pain, right leg: Secondary | ICD-10-CM | POA: Insufficient documentation

## 2023-07-21 DIAGNOSIS — E1151 Type 2 diabetes mellitus with diabetic peripheral angiopathy without gangrene: Secondary | ICD-10-CM | POA: Insufficient documentation

## 2023-07-21 DIAGNOSIS — Z7982 Long term (current) use of aspirin: Secondary | ICD-10-CM | POA: Diagnosis not present

## 2023-07-21 DIAGNOSIS — N189 Chronic kidney disease, unspecified: Secondary | ICD-10-CM | POA: Insufficient documentation

## 2023-07-21 DIAGNOSIS — Z7902 Long term (current) use of antithrombotics/antiplatelets: Secondary | ICD-10-CM | POA: Insufficient documentation

## 2023-07-21 DIAGNOSIS — Z79899 Other long term (current) drug therapy: Secondary | ICD-10-CM | POA: Diagnosis not present

## 2023-07-21 DIAGNOSIS — Z7984 Long term (current) use of oral hypoglycemic drugs: Secondary | ICD-10-CM | POA: Insufficient documentation

## 2023-07-21 DIAGNOSIS — I70219 Atherosclerosis of native arteries of extremities with intermittent claudication, unspecified extremity: Secondary | ICD-10-CM

## 2023-07-21 DIAGNOSIS — I129 Hypertensive chronic kidney disease with stage 1 through stage 4 chronic kidney disease, or unspecified chronic kidney disease: Secondary | ICD-10-CM | POA: Diagnosis not present

## 2023-07-21 DIAGNOSIS — I739 Peripheral vascular disease, unspecified: Secondary | ICD-10-CM

## 2023-07-21 HISTORY — PX: PERIPHERAL VASCULAR BALLOON ANGIOPLASTY: CATH118281

## 2023-07-21 HISTORY — PX: ABDOMINAL AORTOGRAM W/LOWER EXTREMITY: CATH118223

## 2023-07-21 HISTORY — PX: PERIPHERAL VASCULAR INTERVENTION: CATH118257

## 2023-07-21 LAB — POCT I-STAT, CHEM 8
BUN: 52 mg/dL — ABNORMAL HIGH (ref 8–23)
Calcium, Ion: 1.22 mmol/L (ref 1.15–1.40)
Chloride: 109 mmol/L (ref 98–111)
Creatinine, Ser: 2.5 mg/dL — ABNORMAL HIGH (ref 0.61–1.24)
Glucose, Bld: 102 mg/dL — ABNORMAL HIGH (ref 70–99)
HCT: 24 % — ABNORMAL LOW (ref 39.0–52.0)
Hemoglobin: 8.2 g/dL — ABNORMAL LOW (ref 13.0–17.0)
Potassium: 4.7 mmol/L (ref 3.5–5.1)
Sodium: 139 mmol/L (ref 135–145)
TCO2: 19 mmol/L — ABNORMAL LOW (ref 22–32)

## 2023-07-21 LAB — BASIC METABOLIC PANEL
Anion gap: 4 — ABNORMAL LOW (ref 5–15)
BUN: 55 mg/dL — ABNORMAL HIGH (ref 8–23)
CO2: 24 mmol/L (ref 22–32)
Calcium: 9.2 mg/dL (ref 8.9–10.3)
Chloride: 111 mmol/L (ref 98–111)
Creatinine, Ser: 2.06 mg/dL — ABNORMAL HIGH (ref 0.61–1.24)
GFR, Estimated: 32 mL/min — ABNORMAL LOW (ref >60)
Glucose, Bld: 98 mg/dL (ref 70–99)
Potassium: 4.6 mmol/L (ref 3.5–5.1)
Sodium: 139 mmol/L (ref 135–145)

## 2023-07-21 LAB — POCT ACTIVATED CLOTTING TIME: Activated Clotting Time: 181 s

## 2023-07-21 LAB — GLUCOSE, CAPILLARY: Glucose-Capillary: 101 mg/dL — ABNORMAL HIGH (ref 70–99)

## 2023-07-21 SURGERY — ABDOMINAL AORTOGRAM W/LOWER EXTREMITY
Anesthesia: LOCAL

## 2023-07-21 MED ORDER — ACETAMINOPHEN 325 MG PO TABS: 650.0000 mg | ORAL_TABLET | ORAL | Status: DC | PRN

## 2023-07-21 MED ORDER — SODIUM CHLORIDE 0.9 % WEIGHT BASED INFUSION: 1.0000 mL/kg/h | INTRAVENOUS | Status: DC

## 2023-07-21 MED ORDER — SODIUM CHLORIDE 0.9% FLUSH: 3.0000 mL | Freq: Two times a day (BID) | INTRAVENOUS | Status: DC

## 2023-07-21 MED ORDER — LIDOCAINE HCL (PF) 1 % IJ SOLN
INTRAMUSCULAR | Status: DC | PRN
  Administered 2023-07-21: 5 mL

## 2023-07-21 MED ORDER — SODIUM CHLORIDE 0.9% FLUSH: 3.0000 mL | INTRAVENOUS | Status: DC | PRN

## 2023-07-21 MED ORDER — SODIUM CHLORIDE 0.9 % IV SOLN: INTRAVENOUS | Status: DC

## 2023-07-21 MED ORDER — CLOPIDOGREL BISULFATE 75 MG PO TABS: 300.0000 mg | ORAL_TABLET | Freq: Once | ORAL | Status: DC

## 2023-07-21 MED ORDER — SODIUM CHLORIDE 0.9 % IV SOLN: 250.0000 mL | INTRAVENOUS | Status: DC | PRN

## 2023-07-21 MED ORDER — HYDRALAZINE HCL 20 MG/ML IJ SOLN: 5.0000 mg | INTRAMUSCULAR | Status: DC | PRN

## 2023-07-21 MED ORDER — HEPARIN SODIUM (PORCINE) 1000 UNIT/ML IJ SOLN
INTRAMUSCULAR | Status: AC
  Filled 2023-07-21: qty 10

## 2023-07-21 MED ORDER — FENTANYL CITRATE (PF) 100 MCG/2ML IJ SOLN
INTRAMUSCULAR | Status: DC | PRN
  Administered 2023-07-21: 50 ug via INTRAVENOUS

## 2023-07-21 MED ORDER — LIDOCAINE HCL (PF) 1 % IJ SOLN
INTRAMUSCULAR | Status: AC
  Filled 2023-07-21: qty 30

## 2023-07-21 MED ORDER — MIDAZOLAM HCL 2 MG/2ML IJ SOLN
INTRAMUSCULAR | Status: DC | PRN
  Administered 2023-07-21: 1 mg via INTRAVENOUS

## 2023-07-21 MED ORDER — LABETALOL HCL 5 MG/ML IV SOLN: 10.0000 mg | INTRAVENOUS | Status: DC | PRN

## 2023-07-21 MED ORDER — FENTANYL CITRATE (PF) 100 MCG/2ML IJ SOLN
INTRAMUSCULAR | Status: AC
  Filled 2023-07-21: qty 2

## 2023-07-21 MED ORDER — HEPARIN (PORCINE) IN NACL 1000-0.9 UT/500ML-% IV SOLN
INTRAVENOUS | Status: DC | PRN
  Administered 2023-07-21: 1000 mL

## 2023-07-21 MED ORDER — CLOPIDOGREL BISULFATE 75 MG PO TABS: 75.0000 mg | ORAL_TABLET | Freq: Every day | ORAL | 11 refills | Status: AC

## 2023-07-21 MED ORDER — MIDAZOLAM HCL 2 MG/2ML IJ SOLN
INTRAMUSCULAR | Status: AC
  Filled 2023-07-21: qty 2

## 2023-07-21 MED ORDER — HEPARIN SODIUM (PORCINE) 1000 UNIT/ML IJ SOLN
INTRAMUSCULAR | Status: DC | PRN
  Administered 2023-07-21: 6000 [IU] via INTRAVENOUS

## 2023-07-21 MED ORDER — CLOPIDOGREL BISULFATE 75 MG PO TABS: 75.0000 mg | ORAL_TABLET | Freq: Every day | ORAL | Status: DC

## 2023-07-21 MED ORDER — ONDANSETRON HCL 4 MG/2ML IJ SOLN: 4.0000 mg | Freq: Four times a day (QID) | INTRAMUSCULAR | Status: DC | PRN

## 2023-07-21 MED ORDER — IODIXANOL 320 MG/ML IV SOLN
INTRAVENOUS | Status: DC | PRN
  Administered 2023-07-21: 35 mL

## 2023-07-21 MED ORDER — CLOPIDOGREL BISULFATE 300 MG PO TABS
ORAL_TABLET | ORAL | Status: AC
  Filled 2023-07-21: qty 1

## 2023-07-21 MED ORDER — CLOPIDOGREL BISULFATE 300 MG PO TABS
ORAL_TABLET | ORAL | Status: DC | PRN
  Administered 2023-07-21: 300 mg via ORAL

## 2023-07-21 SURGICAL SUPPLY — 17 items
BALLN MUSTANG 5X200X135 (BALLOONS) ×2
BALLOON MUSTANG 5X200X135 (BALLOONS) IMPLANT
CATH OMNI FLUSH 5F 65CM (CATHETERS) IMPLANT
CATH QUICKCROSS .035X135CM (MICROCATHETER) IMPLANT
COVER DOME SNAP 22 D (MISCELLANEOUS) IMPLANT
GLIDEWIRE ADV .035X260CM (WIRE) IMPLANT
KIT ENCORE 26 ADVANTAGE (KITS) IMPLANT
KIT MICROPUNCTURE NIT STIFF (SHEATH) IMPLANT
SET ATX-X65L (MISCELLANEOUS) IMPLANT
SHEATH CATAPULT 6F 45 MP (SHEATH) IMPLANT
SHEATH PINNACLE 5F 10CM (SHEATH) IMPLANT
SHEATH PINNACLE 6F 10CM (SHEATH) IMPLANT
STENT ELUVIA 6X150X130 (Permanent Stent) IMPLANT
STENT ELUVIA 6X80X130 (Permanent Stent) IMPLANT
TRANSDUCER W/STOPCOCK (MISCELLANEOUS) ×2 IMPLANT
TRAY PV CATH (CUSTOM PROCEDURE TRAY) ×2 IMPLANT
WIRE STARTER BENTSON 035X150 (WIRE) IMPLANT

## 2023-07-21 NOTE — Interval H&P Note (Signed)
History and Physical Interval Note:  07/21/2023 11:21 AM  Michael Thomas  has presented today for surgery, with the diagnosis of pad with claudication.  The various methods of treatment have been discussed with the patient and family. After consideration of risks, benefits and other options for treatment, the patient has consented to  Procedure(s): ABDOMINAL AORTOGRAM W/LOWER EXTREMITY (N/A) PERIPHERAL VASCULAR INTERVENTION as a surgical intervention.  The patient's history has been reviewed, patient examined, no change in status, stable for surgery.  I have reviewed the patient's chart and labs.  Questions were answered to the patient's satisfaction.     [name]

## 2023-07-21 NOTE — Op Note (Signed)
DATE OF SERVICE: 07/21/2023  PATIENT:  Michael Thomas  77 y.o. male  PRE-OPERATIVE DIAGNOSIS:  Atherosclerosis of native arteries of right lower extremity causing rest pain  POST-OPERATIVE DIAGNOSIS:  Same  PROCEDURE:   1) Ultrasound guided left common femoral artery access 2) Right lower extremity angiogram with second order cannulation  3) Right femoropopliteal angioplasty and stenting (6x127mm Eluvia, 6x169mm Eluvia, 6x53mm Eluvia) 4) Conscious sedation (35 minutes)  SURGEON:  Rande Brunt. Lenell Antu, MD  ASSISTANT: none  ANESTHESIA:   local and IV sedation  ESTIMATED BLOOD LOSS: minimal  LOCAL MEDICATIONS USED:  LIDOCAINE   COUNTS: confirmed correct.  PATIENT DISPOSITION:  PACU - hemodynamically stable.   Delay start of Pharmacological VTE agent (>24hrs) due to surgical blood loss or risk of bleeding: no  INDICATION FOR PROCEDURE: Michael Thomas is a 77 y.o. male who recently underwent right common femoral endarterectomy for claudication. Previously placed SFA stenting was noted to be occluded on day of surgery after endarterectomy. The patient's claudication has continued and progressed to rest pain. After careful discussion of risks, benefits, and alternatives the patient was offered angiogram. he patient understood and wished to proceed.  OPERATIVE FINDINGS:   Left renal artery: not studied Right renal artery: not studied  Infrarenal aorta: not studied  Left common iliac artery: not studied Right common iliac artery: not studied  Left internal iliac artery: not studied Right internal iliac artery: not studied  Left external iliac artery: not studied Right external iliac artery: widely patent  Left common femoral artery: not studied Right common femoral artery: patent endarterectomy  Left profunda femoris artery: not studied Right profunda femoris artery: widely patent  Left superficial femoral artery: not studied Right superficial femoral artery: small "stump"  patent in origin of artery. Artery occluded throughout remainder.  Left popliteal artery: not studied Right popliteal artery: reconstitutes above the knee via profunda collaterals  Left anterior tibial artery: not studied Right anterior tibial artery: patent  Left tibioperoneal trunk: not studied Right tibioperoneal trunk: patent  Left peroneal artery: not studied Right peroneal artery: patent  Left posterior tibial artery: not studied Right posterior tibial artery: patent  Left pedal circulation: not studied Right pedal circulation: disadvantaged   GLASS score. FP 4. IP 0. Stage III.  WIfI score. Not applicable. No wound.  DESCRIPTION OF PROCEDURE: After identification of the patient in the pre-operative holding area, the patient was transferred to the operating room. The patient was positioned supine on the operating room table. Anesthesia was induced. The groins was prepped and draped in standard fashion. A surgical pause was performed confirming correct patient, procedure, and operative location.  The left groin was anesthetized with subcutaneous injection of 1% lidocaine. Using ultrasound guidance, the left common femoral artery was accessed with micropuncture technique. Fluoroscopy was used to confirm cannulation over the femoral head. The 69F micropuncture sheath was upsized to 241F.   The right common iliac artery was selected with an omniflush catheter and glidewire advantage guidewire. The wire was advanced into the common femoral artery. Over the wire the omni flush catheter was advanced into the external iliac artery. Selective angiography was performed - see above for details.   The decision was made to intervene. The patient was heparinized with 6000 units of heparin. The 241F sheath was exchanged for a 18F x 45 sheath. Selective angiography of the left lower extremity was performed prior to intervention.   The lesions were treated with: Right femoropopliteal angioplasty and stenting  (6x181mm Eluvia, 6x160mm Azalia Bilis,  6x13mm Eluvia)  Completion angiography revealed:  Resolution of R SFA / popliteal occlusion  The sheath was left in place to be removed in the recovery area.  Conscious sedation was administered with the use of IV fentanyl and midazolam under continuous physician and nurse monitoring.  Heart rate, blood pressure, and oxygen saturation were continuously monitored.  Total sedation time was 35 minutes  Upon completion of the case instrument and sharps counts were confirmed correct. The patient was transferred to the PACU in good condition. I was present for all portions of the procedure.  PLAN: ASA / Plavix / Statin. Follow up with me in 4 weeks with ABI and RLE duplex.  Rande Brunt. Lenell Antu, MD [facility] Phone Number: [phone] 07/21/2023 11:21 AM

## 2023-07-21 NOTE — Progress Notes (Signed)
Site area: left groin Site Prior to Removal:  Level 0 Pressure Applied For: 30 minutes Manual:   yes Patient Status During Pull:  stable Post Pull Site:  Level 0 Post Pull Instructions Given:  yes Post Pull Pulses Present: left dp and pt dopplered post sheath pull Dressing Applied:  gauze and tegaderm Bedrest begins @ 1230 Comments:

## 2023-07-24 ENCOUNTER — Ambulatory Visit: Payer: Self-pay | Admitting: Sports Medicine

## 2023-07-24 ENCOUNTER — Encounter (HOSPITAL_COMMUNITY): Payer: Self-pay | Admitting: Vascular Surgery

## 2023-07-24 LAB — POCT ACTIVATED CLOTTING TIME: Activated Clotting Time: 170 s

## 2023-07-24 NOTE — Telephone Encounter (Signed)
Copied from CRM [id number]. Topic: Clinical - Pink Word Triage >> Jul 24, 2023  2:11 PM Fuller Mandril wrote: Reason for Triage: numbness, shortness of breath  This RN attempted to reach patient for triage, no answer, no VM available. 1st attempt. Will attempt to reach patient again.

## 2023-07-24 NOTE — Telephone Encounter (Signed)
  Chief Complaint: Numbness/SOB Symptoms: Patient reports SOB over the last week with exertion I.e climbing stairs, reports he recovers well. Also experiencing new onset numbness in his left index, middle, ring finger x 2 days. Frequency: Intermittent Pertinent Negatives: Patient denies Fever, cough, facial droop, confusion or actively feeling SOB at this time Disposition: [] ED /[] Urgent Care (no appt availability in office) / [] Appointment(In office/virtual)/ []  [facility]/ [x] Home Care/ [] Refused Recommended Disposition /[] [facility]/ [x]  Follow-up with PCP Additional Notes: Patient calls in reporting SOB x 1 week with exertion, primarily climbing stairs, reports he recovers well but has recently stopped going to the gym. He denies any current SOB. Pt also reports numbness that started 2 days ago in his left index, ring and middle finger, denies tingling. Pt reports he spoke to someone in the office today but was not clear on who/what he spoke about.Patient reports he is scheduled with pain management for evaluation on 08-31-23. Home care advice given, patient verbalizes understanding and agrees to plan. Forwarding to provider for review and follow up visit if needed.   Reason for Disposition  [1] MILD longstanding difficulty breathing AND [2]  SAME as normal  Answer Assessment - Initial Assessment Questions 1. RESPIRATORY STATUS: "Describe your breathing?" (e.g., wheezing, shortness of breath, unable to speak, severe coughing)   2. ONSET: "When did this breathing problem begin?"      2-3 weeks ago 3. PATTERN "Does the difficult breathing come and go, or has it been constant since it started?"      Intermittent 4. SEVERITY: "How bad is your breathing?" (e.g., mild, moderate, severe)    - MILD: No SOB at rest, mild SOB with walking, speaks normally in sentences, can lie down, no retractions, pulse < 100.    - MODERATE: SOB at rest, SOB with minimal exertion and prefers  to sit, cannot lie down flat, speaks in phrases, mild retractions, audible wheezing, pulse 100-120.    - SEVERE: Very SOB at rest, speaks in single words, struggling to breathe, sitting hunched forward, retractions, pulse > 120      Mild  6. CARDIAC HISTORY: "Do you have any history of heart disease?" (e.g., heart attack, angina, bypass surgery, angioplasty)      NA 7. LUNG HISTORY: "Do you have any history of lung disease?"  (e.g., pulmonary embolus, asthma, emphysema)     NA 8. CAUSE: "What do you think is causing the breathing problem?"      Unknown 9. OTHER SYMPTOMS: "Do you have any other symptoms? (e.g., dizziness, runny nose, cough, chest pain, fever)     None  Protocols used: Breathing Difficulty-A-AH

## 2023-07-25 NOTE — Telephone Encounter (Signed)
With new shortness of breath after surgery this needs to be seen urgently.  2 of my partners have openings this afternoon, he should get in with one of them.  Joy Jessup and Stephens Shire.

## 2023-07-25 NOTE — Telephone Encounter (Signed)
Attempted call to patient. Left a detailed voice mail message as needing to be seen urgently. Requested a return call to let us know this has been received.

## 2023-07-26 NOTE — Telephone Encounter (Signed)
Left a message for a return call.

## 2023-07-26 NOTE — Telephone Encounter (Signed)
Attempted call again to patient. Left a voice mail message requesting a return call asap.

## 2023-07-31 NOTE — Telephone Encounter (Signed)
Attempted call to patient . Again left a voice mail message requesting a return call  Have not been able to connect to patient after several attempts.

## 2023-07-31 NOTE — Telephone Encounter (Signed)
Patient scheduled.

## 2023-08-01 ENCOUNTER — Telehealth: Payer: Self-pay | Admitting: *Deleted

## 2023-08-01 NOTE — Telephone Encounter (Signed)
Pt called complaint of swelling in right LE, no redness or pain just swelling. Pt encouraged to elevate the leg when not walking or moving and suggested knee high compression stocking 15-20 mmhg. Pt stated he will come Thursday 08/03/23 to be measured and given a pair of compression stocking. Also was able to move pt's appointment and study's up but in order to do so pt will have to see a PA. Pt was agreeable. Encouraged to call if any other issues.

## 2023-08-03 ENCOUNTER — Encounter: Payer: Self-pay | Admitting: Sports Medicine

## 2023-08-03 ENCOUNTER — Ambulatory Visit (INDEPENDENT_AMBULATORY_CARE_PROVIDER_SITE_OTHER): Payer: PPO | Admitting: Sports Medicine

## 2023-08-03 ENCOUNTER — Other Ambulatory Visit: Payer: Self-pay | Admitting: Sports Medicine

## 2023-08-03 VITALS — BP 124/66 | HR 77 | Ht 62.0 in | Wt 124.0 lb

## 2023-08-03 DIAGNOSIS — M47816 Spondylosis without myelopathy or radiculopathy, lumbar region: Secondary | ICD-10-CM | POA: Diagnosis not present

## 2023-08-03 DIAGNOSIS — N138 Other obstructive and reflux uropathy: Secondary | ICD-10-CM

## 2023-08-03 DIAGNOSIS — N1832 Chronic kidney disease, stage 3b: Secondary | ICD-10-CM | POA: Diagnosis not present

## 2023-08-03 DIAGNOSIS — D500 Iron deficiency anemia secondary to blood loss (chronic): Secondary | ICD-10-CM

## 2023-08-03 DIAGNOSIS — E1122 Type 2 diabetes mellitus with diabetic chronic kidney disease: Secondary | ICD-10-CM

## 2023-08-03 DIAGNOSIS — Z7984 Long term (current) use of oral hypoglycemic drugs: Secondary | ICD-10-CM

## 2023-08-03 DIAGNOSIS — D649 Anemia, unspecified: Secondary | ICD-10-CM | POA: Insufficient documentation

## 2023-08-03 DIAGNOSIS — M7989 Other specified soft tissue disorders: Secondary | ICD-10-CM

## 2023-08-03 LAB — HEMOCCULT GUIAC POC 1CARD (OFFICE): Fecal Occult Blood, POC: POSITIVE — AB

## 2023-08-03 NOTE — Telephone Encounter (Signed)
Copied from CRM [id number]. Topic: Clinical - Medication Refill >> Aug 03, 2023  3:34 PM Ivette P wrote: Most Recent Primary Care Visit:  Provider: Monica Becton  Department: [facility]  Visit Type: ACUTE  Date: 08/03/2023  Medication: tamsulosin (FLOMAX) 0.4 MG CAPS capsule  Has the patient contacted their pharmacy? No (Agent: If no, request that the patient contact the pharmacy for the refill. If patient does not wish to contact the pharmacy document the reason why and proceed with request.) (Agent: If yes, when and what did the pharmacy advise?)  Is this the correct pharmacy for this prescription? Yes If no, delete pharmacy and type the correct one.  This is the patient's preferred pharmacy:  [id number] [place], [place] - [address] [place] [address] Phone: [phone] Fax: [phone]    Has the prescription been filled recently? No  Is the patient out of the medication? No  Has the patient been seen for an appointment in the last year OR does the patient have an upcoming appointment? Yes  Can we respond through MyChart? Yes  Agent: Please be advised that Rx refills may take up to 3 business days. We ask that you follow-up with your pharmacy.

## 2023-08-03 NOTE — Assessment & Plan Note (Signed)
Celene Kras continues to have axial left-sided low back pain. MRI does show multiple small disc protrusions with mild desiccation but the dominant finding is bilateral L4-S1 facet joint arthritis, ordering facet injections, we can do these in [place] where he lives.  Update: He is not able to get the injections in [place] anytime soon so he would prefer we order this somewhere in [place] so he can get them done sooner.

## 2023-08-03 NOTE — Assessment & Plan Note (Addendum)
Pleasant 77 year old male complaining of recent shortness of breath for the past 6 weeks. He did have a femoral endarterectomy, estimated 500 mL blood loss during the procedure. Discharged with a hemoglobin in the eights No abdominal pain, clinically stable, Hemoccult strongly positive. I would like to get him in with gastroenterology. I am also going to get additional labs including an anemia panel. If things are heading in the right direction we will continue on medications, if his hemoglobin is similar or lower we will discontinue Plavix and use aspirin alone and add iron. Return to see me in approximately 2 weeks.  Update: Hemoglobin has dropped into the sixes, vitamin B12 is also low, combined with a positive Hemoccult this is an active GI bleed, he will unfortunately need to proceed to the ED, I do think he will need admission, transfusion and upper and lower endoscopy in the hospital.  I would also suggest he get B12 supplementation.

## 2023-08-03 NOTE — Progress Notes (Addendum)
    Procedures performed today:    None.  Independent interpretation of notes and tests performed by another provider:   None.  Brief History, Exam, Impression, and Recommendations:    Acute iron deficiency blood loss and B12 deficiency anema Pleasant 77 year old male complaining of recent shortness of breath for the past 6 weeks. He did have a femoral endarterectomy, estimated 500 mL blood loss during the procedure. Discharged with a hemoglobin in the eights No abdominal pain, clinically stable, Hemoccult strongly positive. I would like to get him in with gastroenterology. I am also going to get additional labs including an anemia panel. If things are heading in the right direction we will continue on medications, if his hemoglobin is similar or lower we will discontinue Plavix and use aspirin alone and add iron. Return to see me in approximately 2 weeks.  Update: Hemoglobin has dropped into the sixes, vitamin B12 is also low, combined with a positive Hemoccult this is an active GI bleed, he will unfortunately need to proceed to the ED, I do think he will need admission, transfusion and upper and lower endoscopy in the hospital.  I would also suggest he get B12 supplementation.  Lumbar spondylosis Michael Thomas continues to have axial left-sided low back pain. MRI does show multiple small disc protrusions with mild desiccation but the dominant finding is bilateral L4-S1 facet joint arthritis, ordering facet injections, we can do these in [place] where he lives.  Update: He is not able to get the injections in [place] anytime soon so he would prefer we order this somewhere in [place] so he can get them done sooner.  Type 2 diabetes mellitus with renal complication (HCC) Tends to do better on pioglitazone than Januvia, Januvia is also difficult for him to obtain. We will have him treated with just glipizide, pioglitazone and metformin. Rechecking A1c.  We are suggesting  hospitalization.  ____________________________________________ Ihor Austin. Benjamin Stain, M.D., ABFM., CAQSM., AME. Primary Care and Sports Medicine [facility]  Adjunct Professor of Family Medicine  [employer]  Restaurant manager, fast food

## 2023-08-03 NOTE — Telephone Encounter (Signed)
Is refill appropriate to send to the pharmacy due to recent dx of anemia? Thanks in advance.

## 2023-08-03 NOTE — Assessment & Plan Note (Signed)
Tends to do better on pioglitazone than Januvia, Januvia is also difficult for him to obtain. We will have him treated with just glipizide, pioglitazone and metformin. Rechecking A1c.

## 2023-08-04 ENCOUNTER — Other Ambulatory Visit: Payer: Self-pay

## 2023-08-04 ENCOUNTER — Telehealth: Payer: Self-pay

## 2023-08-04 ENCOUNTER — Inpatient Hospital Stay
Admission: EM | Admit: 2023-08-04 | Discharge: 2023-08-08 | DRG: 378 | Disposition: A | Discharge status: Home / Self Care | Payer: PPO | Attending: Internal Medicine | Admitting: Internal Medicine

## 2023-08-04 ENCOUNTER — Encounter: Payer: Self-pay | Admitting: Emergency Medicine

## 2023-08-04 DIAGNOSIS — Y848 Other medical procedures as the cause of abnormal reaction of the patient, or of later complication, without mention of misadventure at the time of the procedure: Secondary | ICD-10-CM | POA: Diagnosis not present

## 2023-08-04 DIAGNOSIS — E1122 Type 2 diabetes mellitus with diabetic chronic kidney disease: Secondary | ICD-10-CM

## 2023-08-04 DIAGNOSIS — E538 Deficiency of other specified B group vitamins: Secondary | ICD-10-CM | POA: Diagnosis present

## 2023-08-04 DIAGNOSIS — K2970 Gastritis, unspecified, without bleeding: Secondary | ICD-10-CM | POA: Diagnosis not present

## 2023-08-04 DIAGNOSIS — Z794 Long term (current) use of insulin: Secondary | ICD-10-CM | POA: Diagnosis not present

## 2023-08-04 DIAGNOSIS — N401 Enlarged prostate with lower urinary tract symptoms: Secondary | ICD-10-CM | POA: Diagnosis not present

## 2023-08-04 DIAGNOSIS — K5900 Constipation, unspecified: Secondary | ICD-10-CM | POA: Diagnosis not present

## 2023-08-04 DIAGNOSIS — Z7982 Long term (current) use of aspirin: Secondary | ICD-10-CM

## 2023-08-04 DIAGNOSIS — D509 Iron deficiency anemia, unspecified: Secondary | ICD-10-CM | POA: Diagnosis present

## 2023-08-04 DIAGNOSIS — D6832 Hemorrhagic disorder due to extrinsic circulating anticoagulants: Secondary | ICD-10-CM | POA: Diagnosis not present

## 2023-08-04 DIAGNOSIS — Z833 Family history of diabetes mellitus: Secondary | ICD-10-CM | POA: Diagnosis not present

## 2023-08-04 DIAGNOSIS — Z7984 Long term (current) use of oral hypoglycemic drugs: Secondary | ICD-10-CM

## 2023-08-04 DIAGNOSIS — N183 Chronic kidney disease, stage 3 unspecified: Secondary | ICD-10-CM

## 2023-08-04 DIAGNOSIS — N400 Enlarged prostate without lower urinary tract symptoms: Secondary | ICD-10-CM | POA: Diagnosis not present

## 2023-08-04 DIAGNOSIS — Z79899 Other long term (current) drug therapy: Secondary | ICD-10-CM

## 2023-08-04 DIAGNOSIS — I739 Peripheral vascular disease, unspecified: Secondary | ICD-10-CM

## 2023-08-04 DIAGNOSIS — N3281 Overactive bladder: Secondary | ICD-10-CM | POA: Diagnosis present

## 2023-08-04 DIAGNOSIS — D649 Anemia, unspecified: Secondary | ICD-10-CM

## 2023-08-04 DIAGNOSIS — I129 Hypertensive chronic kidney disease with stage 1 through stage 4 chronic kidney disease, or unspecified chronic kidney disease: Secondary | ICD-10-CM | POA: Diagnosis present

## 2023-08-04 DIAGNOSIS — K921 Melena: Principal | ICD-10-CM | POA: Diagnosis present

## 2023-08-04 DIAGNOSIS — D62 Acute posthemorrhagic anemia: Secondary | ICD-10-CM | POA: Diagnosis not present

## 2023-08-04 DIAGNOSIS — G4733 Obstructive sleep apnea (adult) (pediatric): Secondary | ICD-10-CM | POA: Diagnosis not present

## 2023-08-04 DIAGNOSIS — E785 Hyperlipidemia, unspecified: Secondary | ICD-10-CM | POA: Insufficient documentation

## 2023-08-04 DIAGNOSIS — K922 Gastrointestinal hemorrhage, unspecified: Secondary | ICD-10-CM | POA: Diagnosis not present

## 2023-08-04 DIAGNOSIS — Z7902 Long term (current) use of antithrombotics/antiplatelets: Secondary | ICD-10-CM | POA: Diagnosis not present

## 2023-08-04 DIAGNOSIS — K3189 Other diseases of stomach and duodenum: Secondary | ICD-10-CM | POA: Diagnosis not present

## 2023-08-04 DIAGNOSIS — Z881 Allergy status to other antibiotic agents status: Secondary | ICD-10-CM | POA: Diagnosis not present

## 2023-08-04 DIAGNOSIS — K640 First degree hemorrhoids: Secondary | ICD-10-CM | POA: Diagnosis present

## 2023-08-04 DIAGNOSIS — N1832 Chronic kidney disease, stage 3b: Secondary | ICD-10-CM | POA: Diagnosis present

## 2023-08-04 DIAGNOSIS — I10 Essential (primary) hypertension: Secondary | ICD-10-CM | POA: Diagnosis not present

## 2023-08-04 DIAGNOSIS — T8859XA Other complications of anesthesia, initial encounter: Secondary | ICD-10-CM | POA: Diagnosis not present

## 2023-08-04 DIAGNOSIS — E1151 Type 2 diabetes mellitus with diabetic peripheral angiopathy without gangrene: Secondary | ICD-10-CM | POA: Diagnosis present

## 2023-08-04 DIAGNOSIS — R339 Retention of urine, unspecified: Secondary | ICD-10-CM | POA: Diagnosis not present

## 2023-08-04 LAB — COMPREHENSIVE METABOLIC PANEL
ALT: 10 [IU]/L (ref 0–44)
ALT: 11 U/L (ref 0–44)
AST: 13 [IU]/L (ref 0–40)
AST: 14 U/L — ABNORMAL LOW (ref 15–41)
Albumin: 4.1 g/dL (ref 3.8–4.8)
Albumin: 3.7 g/dL (ref 3.5–5.0)
Alkaline Phosphatase: 34 [IU]/L — ABNORMAL LOW (ref 44–121)
Alkaline Phosphatase: 28 U/L — ABNORMAL LOW (ref 38–126)
Anion gap: 9 (ref 5–15)
BUN/Creatinine Ratio: 24 (ref 10–24)
BUN: 50 mg/dL — ABNORMAL HIGH (ref 8–27)
BUN: 51 mg/dL — ABNORMAL HIGH (ref 8–23)
Bilirubin Total: <0.2 mg/dL (ref 0.0–1.2)
CO2: 18 mmol/L — ABNORMAL LOW (ref 20–29)
CO2: 20 mmol/L — ABNORMAL LOW (ref 22–32)
Calcium: 9.9 mg/dL (ref 8.6–10.2)
Calcium: 8.9 mg/dL (ref 8.9–10.3)
Chloride: 106 mmol/L (ref 96–106)
Chloride: 107 mmol/L (ref 98–111)
Creatinine, Ser: 2.09 mg/dL — ABNORMAL HIGH (ref 0.76–1.27)
Creatinine, Ser: 1.77 mg/dL — ABNORMAL HIGH (ref 0.61–1.24)
GFR, Estimated: 39 mL/min — ABNORMAL LOW (ref >60)
Globulin, Total: 2.6 g/dL (ref 1.5–4.5)
Glucose, Bld: 110 mg/dL — ABNORMAL HIGH (ref 70–99)
Glucose: 140 mg/dL — ABNORMAL HIGH (ref 70–99)
Potassium: 5.2 mmol/L (ref 3.5–5.2)
Potassium: 4.6 mmol/L (ref 3.5–5.1)
Sodium: 140 mmol/L (ref 134–144)
Sodium: 136 mmol/L (ref 135–145)
Total Bilirubin: 0.3 mg/dL (ref <1.2)
Total Protein: 6.7 g/dL (ref 6.0–8.5)
Total Protein: 6.7 g/dL (ref 6.5–8.1)
eGFR: 32 mL/min/{1.73_m2} — ABNORMAL LOW (ref >59)

## 2023-08-04 LAB — IRON,TIBC AND FERRITIN PANEL
Ferritin: 11 ng/mL — ABNORMAL LOW (ref 30–400)
Iron Saturation: 7 % — CL (ref 15–55)
Iron: 36 ug/dL — ABNORMAL LOW (ref 38–169)
Total Iron Binding Capacity: 510 ug/dL — ABNORMAL HIGH (ref 250–450)
UIBC: 474 ug/dL — ABNORMAL HIGH (ref 111–343)

## 2023-08-04 LAB — CBC
HCT: 21.2 % — ABNORMAL LOW (ref 39.0–52.0)
Hematocrit: 22.4 % — ABNORMAL LOW (ref 37.5–51.0)
Hemoglobin: 6.8 g/dL — CL (ref 13.0–17.7)
Hemoglobin: 6.7 g/dL — ABNORMAL LOW (ref 13.0–17.0)
MCH: 26.6 pg (ref 26.6–33.0)
MCH: 27.5 pg (ref 26.0–34.0)
MCHC: 30.4 g/dL — ABNORMAL LOW (ref 31.5–35.7)
MCHC: 31.6 g/dL (ref 30.0–36.0)
MCV: 88 fL (ref 79–97)
MCV: 86.9 fL (ref 80.0–100.0)
Platelets: 639 10*3/uL — ABNORMAL HIGH (ref 150–450)
Platelets: 568 10*3/uL — ABNORMAL HIGH (ref 150–400)
RBC: 2.56 x10E6/uL — CL (ref 4.14–5.80)
RBC: 2.44 MIL/uL — ABNORMAL LOW (ref 4.22–5.81)
RDW: 14.5 % (ref 11.6–15.4)
RDW: 15.4 % (ref 11.5–15.5)
WBC: 4.9 10*3/uL (ref 3.4–10.8)
WBC: 4.4 10*3/uL (ref 4.0–10.5)
nRBC: 0 % (ref 0.0–0.2)

## 2023-08-04 LAB — PROTIME-INR
INR: 1 (ref 0.9–1.2)
Prothrombin Time: 10.9 s (ref 9.1–12.0)

## 2023-08-04 LAB — APTT: aPTT: 25 s (ref 24–33)

## 2023-08-04 LAB — RETICULOCYTES: Retic Ct Pct: 2.9 % — ABNORMAL HIGH (ref 0.6–2.6)

## 2023-08-04 LAB — TSH: TSH: 0.431 u[IU]/mL — ABNORMAL LOW (ref 0.450–4.500)

## 2023-08-04 LAB — PREPARE RBC (CROSSMATCH)

## 2023-08-04 LAB — HEMOGLOBIN A1C
Est. average glucose Bld gHb Est-mCnc: 154 mg/dL
Hgb A1c MFr Bld: 7 % — ABNORMAL HIGH (ref 4.8–5.6)

## 2023-08-04 LAB — B12 AND FOLATE PANEL
Folate: 7.3 ng/mL (ref >3.0)
Vitamin B-12: 164 pg/mL — ABNORMAL LOW (ref 232–1245)

## 2023-08-04 MED ORDER — PANTOPRAZOLE SODIUM 40 MG IV SOLR
40.0000 mg | Freq: Two times a day (BID) | INTRAVENOUS | Status: DC
  Administered 2023-08-05 – 2023-08-08 (×7): 40 mg via INTRAVENOUS
  Filled 2023-08-04 (×7): qty 10

## 2023-08-04 MED ORDER — PANTOPRAZOLE SODIUM 40 MG IV SOLR: 40.0000 mg | INTRAVENOUS | Status: DC

## 2023-08-04 MED ORDER — OMEGA-3-ACID ETHYL ESTERS 1 G PO CAPS
2.0000 | ORAL_CAPSULE | Freq: Two times a day (BID) | ORAL | Status: DC
  Administered 2023-08-05 – 2023-08-08 (×7): 2 g via ORAL
  Filled 2023-08-04 (×7): qty 2

## 2023-08-04 MED ORDER — LISINOPRIL 20 MG PO TABS
20.0000 mg | ORAL_TABLET | Freq: Every day | ORAL | Status: DC
  Administered 2023-08-05 – 2023-08-08 (×4): 20 mg via ORAL
  Filled 2023-08-04 (×2): qty 1
  Filled 2023-08-04: qty 2
  Filled 2023-08-04: qty 1

## 2023-08-04 MED ORDER — PANTOPRAZOLE SODIUM 40 MG IV SOLR
40.0000 mg | INTRAVENOUS | Status: AC
Start: 2023-08-04 — End: 2023-08-04
  Administered 2023-08-04 (×2): 40 mg via INTRAVENOUS
  Filled 2023-08-04: qty 10

## 2023-08-04 MED ORDER — SIMVASTATIN 20 MG PO TABS
40.0000 mg | ORAL_TABLET | Freq: Every day | ORAL | Status: DC
  Administered 2023-08-05 – 2023-08-07 (×3): 40 mg via ORAL
  Filled 2023-08-04: qty 4
  Filled 2023-08-04 (×2): qty 2

## 2023-08-04 MED ORDER — CLOBETASOL PROPIONATE 0.05 % EX OINT: TOPICAL_OINTMENT | Freq: Two times a day (BID) | CUTANEOUS | Status: DC | PRN

## 2023-08-04 MED ORDER — PANTOPRAZOLE INFUSION (NEW) - SIMPLE MED: 8.0000 mg/h | INTRAVENOUS | Status: DC

## 2023-08-04 MED ORDER — ACETAMINOPHEN 325 MG PO TABS
650.0000 mg | ORAL_TABLET | Freq: Four times a day (QID) | ORAL | Status: DC | PRN
Start: 2023-08-04 — End: 2023-08-08

## 2023-08-04 MED ORDER — PANTOPRAZOLE SODIUM 40 MG IV SOLR: 40.0000 mg | Freq: Two times a day (BID) | INTRAVENOUS | Status: DC

## 2023-08-04 MED ORDER — ONDANSETRON HCL 4 MG PO TABS: 4.0000 mg | ORAL_TABLET | Freq: Four times a day (QID) | ORAL | Status: DC | PRN

## 2023-08-04 MED ORDER — TAMSULOSIN HCL 0.4 MG PO CAPS: 0.4000 mg | ORAL_CAPSULE | Freq: Two times a day (BID) | ORAL | 3 refills | Status: DC

## 2023-08-04 MED ORDER — FESOTERODINE FUMARATE ER 4 MG PO TB24
4.0000 mg | ORAL_TABLET | Freq: Every day | ORAL | Status: DC
Start: 2023-08-05 — End: 2023-08-08
  Administered 2023-08-05 – 2023-08-08 (×4): 4 mg via ORAL
  Filled 2023-08-04 (×4): qty 1

## 2023-08-04 MED ORDER — SODIUM CHLORIDE 0.9 % IV SOLN: INTRAVENOUS | Status: AC

## 2023-08-04 MED ORDER — GLIPIZIDE ER 10 MG PO TB24
10.0000 mg | ORAL_TABLET | Freq: Every day | ORAL | Status: DC
  Administered 2023-08-05 – 2023-08-06 (×2): 10 mg via ORAL
  Filled 2023-08-04 (×3): qty 1

## 2023-08-04 MED ORDER — ONDANSETRON HCL 4 MG/2ML IJ SOLN: 4.0000 mg | Freq: Four times a day (QID) | INTRAMUSCULAR | Status: DC | PRN

## 2023-08-04 MED ORDER — PIOGLITAZONE HCL 30 MG PO TABS
30.0000 mg | ORAL_TABLET | Freq: Every day | ORAL | Status: DC
  Administered 2023-08-05 – 2023-08-06 (×2): 30 mg via ORAL
  Filled 2023-08-04 (×4): qty 1

## 2023-08-04 MED ORDER — FENOFIBRATE 160 MG PO TABS
160.0000 mg | ORAL_TABLET | Freq: Every day | ORAL | Status: DC
  Administered 2023-08-05 – 2023-08-08 (×4): 160 mg via ORAL
  Filled 2023-08-04 (×6): qty 1

## 2023-08-04 MED ORDER — TAMSULOSIN HCL 0.4 MG PO CAPS
0.4000 mg | ORAL_CAPSULE | Freq: Two times a day (BID) | ORAL | Status: DC
  Administered 2023-08-05 – 2023-08-08 (×7): 0.4 mg via ORAL
  Filled 2023-08-04 (×7): qty 1

## 2023-08-04 MED ORDER — TRAZODONE HCL 50 MG PO TABS
25.0000 mg | ORAL_TABLET | Freq: Every evening | ORAL | Status: DC | PRN
  Administered 2023-08-05 (×2): 25 mg via ORAL
  Filled 2023-08-04 (×2): qty 1

## 2023-08-04 MED ORDER — ACETAMINOPHEN 650 MG RE SUPP: 650.0000 mg | Freq: Four times a day (QID) | RECTAL | Status: DC | PRN

## 2023-08-04 MED ORDER — LISINOPRIL-HYDROCHLOROTHIAZIDE 20-25 MG PO TABS: 1.0000 | ORAL_TABLET | Freq: Every day | ORAL | Status: DC

## 2023-08-04 MED ORDER — HYDROCHLOROTHIAZIDE 25 MG PO TABS
25.0000 mg | ORAL_TABLET | Freq: Every day | ORAL | Status: DC
  Administered 2023-08-05 – 2023-08-08 (×4): 25 mg via ORAL
  Filled 2023-08-04 (×4): qty 1

## 2023-08-04 NOTE — Assessment & Plan Note (Signed)
-   We will continue Flomax 

## 2023-08-04 NOTE — Telephone Encounter (Signed)
Med refill request completed by provider earlier this morning.

## 2023-08-04 NOTE — ED Triage Notes (Signed)
Had blood draw and pcp and they called him and said hgb was 6.8.  pt says that he had hemocult done and it was positive.  Says stool is very dark.

## 2023-08-04 NOTE — Assessment & Plan Note (Signed)
>>  ASSESSMENT AND PLAN FOR ABLA (ACUTE BLOOD LOSS ANEMIA) WRITTEN ON 08/04/2023  9:44 PM BY MANSY, JAN A, MD  - The patient was typed and crossmatch and will be transfused 1 unit of packed red blood cells. - Will follow posttransfusion H&H. - This is clearly secondary to #1. - Management otherwise as above.

## 2023-08-04 NOTE — Assessment & Plan Note (Signed)
>>  ASSESSMENT AND PLAN FOR ESSENTIAL HYPERTENSION WRITTEN ON 08/04/2023  9:47 PM BY MANSY, JAN A, MD  - We will continue anti-hypertensive therapy while holding off diuretic therapy.

## 2023-08-04 NOTE — Telephone Encounter (Signed)
 Copied from CRM [id number]. Topic: Clinical - Medication Refill >> Aug 03, 2023  3:34 PM Ivette P wrote: Most Recent Primary Care Visit:  Provider: Monica Becton  Department: [facility]  Visit Type: ACUTE  Date: 08/03/2023  Medication: tamsulosin (FLOMAX) 0.4 MG CAPS capsule  Has the patient contacted their pharmacy? No (Agent: If no, request that the patient contact the pharmacy for the refill. If patient does not wish to contact the pharmacy document the reason why and proceed with request.) (Agent: If yes, when and what did the pharmacy advise?)  Is this the correct pharmacy for this prescription? Yes If no, delete pharmacy and type the correct one.  This is the patient's preferred pharmacy:  [id number] [place], [place] - [address] [place] [address] Phone: [phone] Fax: [phone]    Has the prescription been filled recently? No  Is the patient out of the medication? No  Has the patient been seen for an appointment in the last year OR does the patient have an upcoming appointment? Yes  Can we respond through MyChart? Yes  Agent: Please be advised that Rx refills may take up to 3 business days. We ask that you follow-up with your pharmacy.

## 2023-08-04 NOTE — ED Provider Triage Note (Signed)
Emergency Medicine Provider Triage Evaluation Note  Michael Thomas , a 77 y.o. male  was evaluated in triage.  Pt complains of weakness and dark stools. Patient was seen by his PCP yesterday and had a positive hemaoccult. CBC was drawn and hemoglobin was 6.8.   Review of Systems  Positive: exhaustion Negative: Belly pain  Physical Exam  BP (!) 138/47 (BP Location: Left Arm)   Pulse 76   Temp 98.6 F (37 C) (Oral)   Resp 18   SpO2 100%  Gen:   Awake, no distress   Resp:  Normal effort  MSK:   Moves extremities without difficulty  Other:    Medical Decision Making  Medically screening exam initiated at 2:27 PM.  Appropriate orders placed.  Michael Thomas was informed that the remainder of the evaluation will be completed by another provider, this initial triage assessment does not replace that evaluation, and the importance of remaining in the ED until their evaluation is complete.     Cameron Ali, PA-C 08/04/23 1431

## 2023-08-04 NOTE — H&P (Addendum)
[place]   PATIENT NAME: Michael Thomas    MR#:  [id number]  DATE OF BIRTH:  21-Mar-1946  DATE OF ADMISSION:  08/04/2023  PRIMARY CARE PHYSICIAN: Michael Becton, MD   Patient is coming from: Home.    REQUESTING/REFERRING PHYSICIAN: Trinna Post, MD  CHIEF COMPLAINT:   Chief Complaint  Patient presents with   Abnormal Lab  Fatigue and tiredness  HISTORY OF PRESENT ILLNESS:  Michael Thomas is a 77 y.o. male with medical history significant for type 2 diabetes mellitus with chronic kidney disease, hypertension and dyslipidemia, obstructive sleep apnea and BPH, who presented to the ER with acute onset of generalized fatigue and tiredness over the last 3 weeks with associated intermittent melena that he specifically noted since yesterday.  He was seen by his PCP and had an H&H checked yesterday.  He was called today to come to the ER for significant anemia.  He denied any bright red bleeding per rectum.  No nausea or vomiting or abdominal pain.  No dysuria, oliguria or hematuria or flank pain.  No presyncope or syncope.  No cough or wheezing or hemoptysis.  No chest pain or palpitations.  No other bleeding diathesis.  His last colonoscopy was in his mid 67s.  ED Course: When the patient came to the ER, BP was 135/52 with otherwise normal vital signs.  Labs revealed a CO2 of 21 and a BUN of 51 with a creatinine 1.77 compared to 50/2.09 yesterday with otherwise unremarkable CMP.  CBC showed anemia with hemoglobin of 6.7 hematocrit 21.2 compared to 6.8 and 22.4 yesterday and platelets 568 compared to 639 yesterday with microcytosis.  Had a positive stool Hemoccult in the office yesterday.  TSH was 0.43.  Blood group is A+ with negative antibody screen. EKG as reviewed by me : None Imaging: None.  He was typed and crossmatch and will be transfused 1 unit of packed red blood cells.  He was ordered IV Protonix.  He will be admitted to a medical telemetry bed for further  evaluation and management. PAST MEDICAL HISTORY:   Past Medical History:  Diagnosis Date   Benign prostatic hyperplasia with urinary obstruction 03/25/2015   Chronic kidney disease    Diabetes mellitus without complication (HCC)    History of kidney stones    Hyperlipidemia    Hypertension    OSA (obstructive sleep apnea)    Vitamin D deficiency     PAST SURGICAL HISTORY:   Past Surgical History:  Procedure Laterality Date   ABDOMINAL AORTOGRAM W/LOWER EXTREMITY N/A 05/26/2023   Procedure: ABDOMINAL AORTOGRAM W/LOWER EXTREMITY;  Surgeon: Michael Douglas, MD;  [facility];  Service: Cardiovascular;  Laterality: N/A;   ABDOMINAL AORTOGRAM W/LOWER EXTREMITY N/A 07/21/2023   Procedure: ABDOMINAL AORTOGRAM W/LOWER EXTREMITY;  Surgeon: Michael Douglas, MD;  [facility];  Service: Cardiovascular;  Laterality: N/A;   DG ARTHRO THUMB*L*     ENDARTERECTOMY FEMORAL Right 06/15/2023   Procedure: RIGHT COMMON FEMORAL ENDARTERECTOMY;  Surgeon: Michael Douglas, MD;  [facility];  Service: Vascular;  Laterality: Right;   INTRAOPERATIVE ARTERIOGRAM Right 06/15/2023   Procedure: INTRA OPERATIVE RIGHT LOWER EXTREMITY  ARTERIOGRAM;  Surgeon: Michael Douglas, MD;  Location: [facility] OR;  Service: Vascular;  Laterality: Right;   IR ANGIOGRAM EXTREMITY BILATERAL  02/20/2018   IR ANGIOGRAM EXTREMITY BILATERAL  04/05/2019   IR ANGIOGRAM EXTREMITY LEFT  05/07/2020   IR ANGIOGRAM EXTREMITY LEFT  10/13/2022   IR ANGIOGRAM EXTREMITY RIGHT  02/15/2021   IR ANGIOGRAM EXTREMITY RIGHT  12/14/2021   IR ANGIOGRAM SELECTIVE EACH ADDITIONAL VESSEL  02/20/2018   IR ANGIOGRAM SELECTIVE EACH ADDITIONAL VESSEL  10/13/2022   IR FEM POP ART ATHERECT INC PTA MOD SED  02/20/2018   IR FEM POP ART ATHERECT INC PTA MOD SED  05/14/2019   IR FEM POP ART ATHERECT INC PTA MOD SED  05/07/2020   IR FEM POP ART ATHERECT INC PTA MOD SED  02/15/2021   IR FEM POP ART STENT INC PTA MOD SED  12/14/2021   IR ILIAC ART  STENT INC PTA MOD SED  10/13/2022   IR INTRAVASCULAR ULTRASOUND NON CORONARY  02/15/2021   IR INTRAVASCULAR ULTRASOUND NON CORONARY  12/14/2021   IR INTRAVASCULAR ULTRASOUND NON CORONARY  10/13/2022   IR RADIOLOGIST EVAL & MGMT  02/01/2018   IR RADIOLOGIST EVAL & MGMT  03/20/2018   IR RADIOLOGIST EVAL & MGMT  07/25/2018   IR RADIOLOGIST EVAL & MGMT  03/07/2019   IR RADIOLOGIST EVAL & MGMT  04/23/2019   IR RADIOLOGIST EVAL & MGMT  06/18/2019   IR RADIOLOGIST EVAL & MGMT  04/29/2020   IR RADIOLOGIST EVAL & MGMT  06/04/2020   IR RADIOLOGIST EVAL & MGMT  01/20/2021   IR RADIOLOGIST EVAL & MGMT  04/27/2021   IR RADIOLOGIST EVAL & MGMT  08/12/2021   IR RADIOLOGIST EVAL & MGMT  11/02/2021   IR RADIOLOGIST EVAL & MGMT  11/15/2021   IR RADIOLOGIST EVAL & MGMT  01/26/2022   IR RADIOLOGIST EVAL & MGMT  03/23/2022   IR RADIOLOGIST EVAL & MGMT  09/23/2022   IR RADIOLOGIST EVAL & MGMT  11/25/2022   IR RADIOLOGIST EVAL & MGMT  03/22/2023   IR US GUIDE VASC ACCESS LEFT  04/05/2019   IR US GUIDE VASC ACCESS LEFT  02/15/2021   IR US GUIDE VASC ACCESS LEFT  12/14/2021   IR US GUIDE VASC ACCESS RIGHT  02/20/2018   IR US GUIDE VASC ACCESS RIGHT  05/14/2019   IR US GUIDE VASC ACCESS RIGHT  05/07/2020   IR US GUIDE VASC ACCESS RIGHT  10/13/2022   NECK SURGERY     PATCH ANGIOPLASTY Right 06/15/2023   Procedure: PATCH ANGIOPLASTY USING Michael Thomas 1X6CM;  Surgeon: Michael Douglas, MD;  Location: [facility];  Service: Vascular;  Laterality: Right;   PERIPHERAL VASCULAR BALLOON ANGIOPLASTY  07/21/2023   Procedure: PERIPHERAL VASCULAR BALLOON ANGIOPLASTY;  Surgeon: Michael Douglas, MD;  [facility];  Service: Cardiovascular;;   PERIPHERAL VASCULAR INTERVENTION  07/21/2023   Procedure: PERIPHERAL VASCULAR INTERVENTION;  Surgeon: Michael Douglas, MD;  [facility];  Service: Cardiovascular;;   VASECTOMY      SOCIAL HISTORY:   Social History   Tobacco Use   Smoking status: Never   Smokeless tobacco: Never   Substance Use Topics   Alcohol use: No    FAMILY HISTORY:   Family History  Problem Relation Age of Onset   Stroke Mother    Diabetes Mellitus II Mother    Prostate cancer Neg Hx    Kidney cancer Neg Hx    Bladder Cancer Neg Hx     DRUG ALLERGIES:   Allergies  Allergen Reactions   Amoxicillin Other (See Comments)    Fatigue and Myalgia and Rigors x2 attempts    REVIEW OF SYSTEMS:   ROS As per history of present illness. All pertinent systems were  reviewed above. Constitutional, HEENT, cardiovascular, respiratory, GI, GU, musculoskeletal, neuro, psychiatric, endocrine, integumentary and hematologic systems were reviewed and are otherwise negative/unremarkable except for positive findings mentioned above in the HPI.   MEDICATIONS AT HOME:   Prior to Admission medications   Medication Sig Start Date End Date Taking? Authorizing Provider  AMBULATORY NON FORMULARY MEDICATION One Touch Ultra 2 glucometer 05/15/20   Michael Becton, MD  aspirin EC 81 MG tablet Take 81 mg by mouth at bedtime.    [provider]  Blood Glucose Monitoring Suppl (ONETOUCH VERIO REFLECT) w/Device KIT Check fasting blood sugar every morning and 2 hours after largest meal of the day., 11/22/21   Michael Becton, MD  clobetasol ointment (TEMOVATE) 0.05 % APPLY TOPICALLY TWO TIMES A DAY AS NEEDED 03/09/23   Michael Becton, MD  clopidogrel (PLAVIX) 75 MG tablet Take 1 tablet (75 mg total) by mouth daily at 6 (six) AM. 06/17/23   Emilie Rutter, PA-C  clopidogrel (PLAVIX) 75 MG tablet Take 1 tablet (75 mg total) by mouth daily. 07/21/23   Michael Douglas, MD  fenofibrate 160 MG tablet Take 1 tablet (160 mg total) by mouth daily. 11/28/22   Michael Becton, MD  glipiZIDE (GLUCOTROL XL) 10 MG 24 hr tablet TAKE 1 TABLET BY MOUTH EVERY DAY WITH BREAKFAST 02/06/23   Michael Becton, MD  Lancets (ONETOUCH DELICA PLUS LANCET33G) MISC USE UP TO 3 TIMES DAILY. 05/05/22    Michael Becton, MD  lisinopril-hydrochlorothiazide (ZESTORETIC) 20-25 MG tablet TAKE 1 TABLET DAILY. 04/11/23   Michael Becton, MD  metFORMIN (GLUCOPHAGE) 1000 MG tablet TAKE 1 TABLET (1,000 MG TOTAL) BY MOUTH TWICE A DAY WITH FOOD 07/13/23   Michael Becton, MD  omega-3 acid ethyl esters (LOVAZA) 1 g capsule TAKE 2 CAPSULES BY MOUTH TWICE A DAY 04/25/23   Michael Becton, MD  ONETOUCH VERIO test strip CHECK FASTING BLOOD SUGAR EVERY MORNING AND 2 HOURS AFTER LARGEST MEAL OF THE DAY., 01/31/23   Michael Becton, MD  pioglitazone (ACTOS) 30 MG tablet Take 30 mg by mouth daily.    [provider]  simvastatin (ZOCOR) 40 MG tablet TAKE 1 TABLET BY MOUTH EVERYDAY AT BEDTIME 04/11/23   Michael Becton, MD  solifenacin (VESICARE) 5 MG tablet Take 1 tablet (5 mg total) by mouth daily. 01/11/23   Michael Becton, MD  tamsulosin (FLOMAX) 0.4 MG CAPS capsule Take 1 capsule (0.4 mg total) by mouth 2 (two) times daily. 08/04/23   Michael Becton, MD      VITAL SIGNS:  Blood pressure (!) 156/70, pulse 68, temperature 98 F (36.7 C), temperature source Oral, resp. rate 20, SpO2 100%.  PHYSICAL EXAMINATION:  Physical Exam  GENERAL:  77 y.o.-year-old patient lying in the bed with no acute distress.  EYES: Pupils equal, round, reactive to light and accommodation. No scleral icterus.  Positive pallor.  Extraocular muscles intact.  HEENT: Head atraumatic, normocephalic. Oropharynx and nasopharynx clear.  NECK:  Supple, no jugular venous distention. No thyroid enlargement, no tenderness.  LUNGS: Normal breath sounds bilaterally, no wheezing, rales,rhonchi or crepitation. No use of accessory muscles of respiration.  CARDIOVASCULAR: Regular rate and rhythm, S1, S2 normal. No murmurs, rubs, or gallops.  ABDOMEN: Soft, nondistended, nontender. Bowel sounds present. No organomegaly or mass.  EXTREMITIES: No pedal edema, cyanosis, or clubbing.  NEUROLOGIC:  Cranial nerves II through XII are intact. Muscle strength 5/5 in all extremities. Sensation intact. Gait not checked.  PSYCHIATRIC: The patient is alert and oriented x 3.  Normal affect and good eye contact. SKIN: No obvious rash, lesion, or ulcer.   LABORATORY PANEL:   CBC Recent Labs  Lab 08/04/23 1430  WBC 4.4  HGB 6.7*  HCT 21.2*  PLT 568*   ------------------------------------------------------------------------------------------------------------------  Chemistries  Recent Labs  Lab 08/04/23 1430  NA 136  K 4.6  CL 107  CO2 20*  GLUCOSE 110*  BUN 51*  CREATININE 1.77*  CALCIUM 8.9  AST 14*  ALT 11  ALKPHOS 28*  BILITOT 0.3   ------------------------------------------------------------------------------------------------------------------  Cardiac Enzymes No results for input(s): "TROPONINI" in the last 168 hours. ------------------------------------------------------------------------------------------------------------------  RADIOLOGY:  No results found.    IMPRESSION AND PLAN:  Assessment and Plan: * GI bleeding - The patient will be admitted to a medical telemetry bed. - We will follow serial hemoglobins and hematocrits. - We will continue IV Protonix with 40 mg IV twice daily after and 80 mg IV bolus. - We will hold his aspirin and Plavix. - GI consult will be obtained. - I notified Dr. Mia Creek about the patient.  ABLA (acute blood loss anemia) - The patient was typed and crossmatch and will be transfused 1 unit of packed red blood cells. - Will follow posttransfusion H&H. - This is clearly secondary to #1. - Management otherwise as above.  Type 2 diabetes mellitus with chronic kidney disease, with long-term current use of insulin (HCC) - The patient will be placed on supplemental coverage with NovoLog. - We will hold off metformin. - We will continue Glucotrol XL and Actos. - He has stable stage IIIb chronic kidney disease.  Essential  hypertension - We will continue anti-hypertensive therapy while holding off diuretic therapy.  Dyslipidemia - We will continue fenofibrate.  BPH (benign prostatic hyperplasia) with detrusor instability/OAB - We will continue Flomax.   DVT prophylaxis: SCDs.  Medical prophylaxis contraindicated due to GI bleeding. Advanced Care Planning:  Code Status: full code. Family Communication:  The plan of care was discussed in details with the patient (and family). I answered all questions. The patient agreed to proceed with the above mentioned plan. Further management will depend upon hospital course. Disposition Plan: Back to previous home environment Consults called: GI. All the records are reviewed and case discussed with ED provider.  Status is: Inpatient  At the time of the admission, it appears that the appropriate admission status for this patient is inpatient.  This is judged to be reasonable and necessary in order to provide the required intensity of service to ensure the patient's safety given the presenting symptoms, physical exam findings and initial radiographic and laboratory data in the context of comorbid conditions.  The patient requires inpatient status due to high intensity of service, high risk of further deterioration and high frequency of surveillance required.  I certify that at the time of admission, it is my clinical judgment that the patient will require inpatient hospital care extending more than 2 midnights.                            Dispo: The patient is from: Home              Anticipated d/c is to: Home              Patient currently is not medically stable to d/c.              Difficult to place patient:  No  Hannah Beat M.D on 08/04/2023 at 10:21 PM  [facility]   From 7 PM-7 AM, contact night-coverage [email or web address]  CC: Primary care physician; Michael Becton, MD

## 2023-08-04 NOTE — Addendum Note (Signed)
Addended by: Monica Becton on: 08/04/2023 09:46 AM   Modules accepted: Level of Service

## 2023-08-04 NOTE — ED Provider Notes (Signed)
[facility] Provider Note    Event Date/Time   First MD Initiated Contact with Patient 08/04/23 1935     (approximate)   History   Abnormal Lab   HPI  Michael Thomas is a 77 year old male with history of T2DM, PAD on Plavix and baby aspirin presenting to the emergency department for evaluation of weakness.  For the past few weeks, patient has had general weakness.  He has had a couple episodes of dark stools, but says he does not regularly check this, so unsure how often or when exactly it started.  Hemoglobin recently in the eights a few weeks ago.  In his primary care office he was found to have Hemoccult positive stool and had lab sent demonstrating hemoglobin drop into the sixes.  There was concern for acute GI bleed so it was recommended that the patient present to the ER for anticipated admission for endoscopy.  No chest pain.  No shortness of breath at rest, but does report getting winded more easily when walking around.  No abdominal pain, nausea, vomiting.  Reports distant colonoscopy greater than 10 years ago, baby aspirin daily and Plavix, denies other NSAID use.    Physical Exam   Triage Vital Signs: ED Triage Vitals  Encounter Vitals Group     BP 08/04/23 1424 (!) 138/47     Systolic BP Percentile --      Diastolic BP Percentile --      Pulse Rate 08/04/23 1424 76     Resp 08/04/23 1424 18     Temp 08/04/23 1424 98.6 F (37 C)     Temp Source 08/04/23 1424 Oral     SpO2 08/04/23 1424 100 %     Weight --      Height --      Head Circumference --      Peak Flow --      Pain Score 08/04/23 1430 0     Pain Loc --      Pain Education --      Exclude from Growth Chart --     Most recent vital signs: Vitals:   08/04/23 2230 08/04/23 2300  BP: (!) 150/74 (!) 150/77  Pulse: 66 65  Resp: 20 (!) 21  Temp:  98 F (36.7 C)  SpO2: 100% 100%     General: Awake, interactive  CV:  Regular rate, good peripheral perfusion.   Resp:  Unlabored respirations.  Abd:  Nondistended, soft, nontender to palpation Neuro:  Symmetric facial movement, fluid speech   ED Results / Procedures / Treatments   Labs (all labs ordered are listed, but only abnormal results are displayed) Labs Reviewed  COMPREHENSIVE METABOLIC PANEL - Abnormal; Notable for the following components:      Result Value   CO2 20 (*)    Glucose, Bld 110 (*)    BUN 51 (*)    Creatinine, Ser 1.77 (*)    AST 14 (*)    Alkaline Phosphatase 28 (*)    GFR, Estimated 39 (*)    All other components within normal limits  CBC - Abnormal; Notable for the following components:   RBC 2.44 (*)    Hemoglobin 6.7 (*)    HCT 21.2 (*)    Platelets 568 (*)    All other components within normal limits  BASIC METABOLIC PANEL  CBC  TYPE AND SCREEN  PREPARE RBC (CROSSMATCH)     EKG EKG independently reviewed interpreted by myself (ER attending)  demonstrates:    RADIOLOGY Imaging independently reviewed and interpreted by myself demonstrates:    PROCEDURES:  Critical Care performed: Yes, see critical care procedure note(s)  CRITICAL CARE Performed by: Trinna Post   Total critical care time: 31 minutes  Critical care time was exclusive of separately billable procedures and treating other patients.  Critical care was necessary to treat or prevent imminent or life-threatening deterioration.  Critical care was time spent personally by me on the following activities: development of treatment plan with patient and/or surrogate as well as nursing, discussions with consultants, evaluation of patient's response to treatment, examination of patient, obtaining history from patient or surrogate, ordering and performing treatments and interventions, ordering and review of laboratory studies, ordering and review of radiographic studies, pulse oximetry and re-evaluation of patient's condition.   Procedures   MEDICATIONS ORDERED IN ED: Medications  fenofibrate  tablet 160 mg (has no administration in time range)  omega-3 acid ethyl esters (LOVAZA) capsule 2 g (has no administration in time range)  simvastatin (ZOCOR) tablet 40 mg (has no administration in time range)  glipiZIDE (GLUCOTROL XL) 24 hr tablet 10 mg (has no administration in time range)  pioglitazone (ACTOS) tablet 30 mg (has no administration in time range)  fesoterodine (TOVIAZ) tablet 4 mg (has no administration in time range)  tamsulosin (FLOMAX) capsule 0.4 mg (has no administration in time range)  clobetasol ointment (TEMOVATE) 0.05 % (has no administration in time range)  0.9 %  sodium chloride infusion ( Intravenous New Bag/Given 08/04/23 2210)  acetaminophen (TYLENOL) tablet 650 mg (has no administration in time range)    Or  acetaminophen (TYLENOL) suppository 650 mg (has no administration in time range)  traZODone (DESYREL) tablet 25 mg (has no administration in time range)  ondansetron (ZOFRAN) tablet 4 mg (has no administration in time range)    Or  ondansetron (ZOFRAN) injection 4 mg (has no administration in time range)  pantoprazole (PROTONIX) injection 40 mg (40 mg Intravenous Given 08/04/23 2211)    Followed by  pantoprazole (PROTONIX) injection 40 mg (has no administration in time range)  lisinopril (ZESTRIL) tablet 20 mg (has no administration in time range)    And  hydrochlorothiazide (HYDRODIURIL) tablet 25 mg (has no administration in time range)     IMPRESSION / MDM / ASSESSMENT AND PLAN / ED COURSE  I reviewed the triage vital signs and the nursing notes.  Differential diagnosis includes, but is not limited to, acute blood loss anemia secondary to GI bleed, worsening chronic anemia, other acute bleeding source  Patient's presentation is most consistent with acute presentation with potential threat to life or bodily function.  77 year old male presenting with weakness, Hemoccult positive dark stools in primary care office, and worsening anemia. Fortunately  with stable vitals here.  Lab work here does demonstrate persistent anemia with a hemoglobin of 6.7.  Labs with stable renal dysfunction.  Ordered for a unit of PRBCs with hemoglobin under 7 and generalized weakness.  Also ordered for Protonix.  I did not repeat patient's rectal exam given recent documentation of Hemoccult positive stool, but with this information and anemia, agree with primary care doctor that patient is appropriate for admission for further evaluation of possible acute GI bleed.  Will reach out to hospitalist team.     FINAL CLINICAL IMPRESSION(S) / ED DIAGNOSES   Final diagnoses:  Gastrointestinal hemorrhage, unspecified gastrointestinal hemorrhage type  Symptomatic anemia     Rx / DC Orders   ED Discharge Orders  None        Note:  This document was prepared using Dragon voice recognition software and may include unintentional dictation errors.   Trinna Post, MD 08/04/23 [phone]

## 2023-08-04 NOTE — Assessment & Plan Note (Signed)
Triglycerides are normal - Continue fenofibrate

## 2023-08-04 NOTE — Assessment & Plan Note (Addendum)
-   The patient was typed and crossmatch and will be transfused 1 unit of packed red blood cells. - Will follow posttransfusion H&H. - This is clearly secondary to #1. - Management otherwise as above.

## 2023-08-04 NOTE — Assessment & Plan Note (Addendum)
-   We will continue anti-hypertensive therapy while holding off diuretic therapy.

## 2023-08-04 NOTE — Telephone Encounter (Signed)
Patient currently in the ER to follow up on GI bleed and abnormal labs.

## 2023-08-04 NOTE — Telephone Encounter (Signed)
We can refill this, I will sign off but in my result note I am sending him to the ED because he has an active GI bleed and is severely anemic.  When you let him know that I am refilling the Flomax just please let him know about the result as well.

## 2023-08-04 NOTE — Assessment & Plan Note (Addendum)
-   The patient will be admitted to a medical telemetry bed. - We will follow serial hemoglobins and hematocrits. - We will continue IV Protonix with 40 mg IV twice daily after and 80 mg IV bolus. - We will hold his aspirin and Plavix. - GI consult will be obtained. - I notified Dr. Mia Creek about the patient.

## 2023-08-04 NOTE — Assessment & Plan Note (Addendum)
-   The patient will be placed on supplemental coverage with NovoLog. - We will hold off metformin. - We will continue Glucotrol XL and Actos. - He has stable stage IIIb chronic kidney disease.

## 2023-08-05 ENCOUNTER — Encounter: Payer: Self-pay | Admitting: Family Medicine

## 2023-08-05 DIAGNOSIS — D62 Acute posthemorrhagic anemia: Secondary | ICD-10-CM | POA: Diagnosis not present

## 2023-08-05 DIAGNOSIS — D649 Anemia, unspecified: Secondary | ICD-10-CM | POA: Diagnosis not present

## 2023-08-05 DIAGNOSIS — K922 Gastrointestinal hemorrhage, unspecified: Secondary | ICD-10-CM | POA: Diagnosis not present

## 2023-08-05 LAB — CBC
HCT: 21 % — ABNORMAL LOW (ref 39.0–52.0)
Hemoglobin: 7 g/dL — ABNORMAL LOW (ref 13.0–17.0)
MCH: 28.5 pg (ref 26.0–34.0)
MCHC: 33.3 g/dL (ref 30.0–36.0)
MCV: 85.4 fL (ref 80.0–100.0)
Platelets: 463 10*3/uL — ABNORMAL HIGH (ref 150–400)
RBC: 2.46 MIL/uL — ABNORMAL LOW (ref 4.22–5.81)
RDW: 15.3 % (ref 11.5–15.5)
WBC: 5 10*3/uL (ref 4.0–10.5)
nRBC: 0 % (ref 0.0–0.2)

## 2023-08-05 LAB — TYPE AND SCREEN
ABO/RH(D): A POS
Antibody Screen: NEGATIVE
Unit division: 0

## 2023-08-05 LAB — BASIC METABOLIC PANEL
Anion gap: 5 (ref 5–15)
BUN: 46 mg/dL — ABNORMAL HIGH (ref 8–23)
CO2: 21 mmol/L — ABNORMAL LOW (ref 22–32)
Calcium: 8.6 mg/dL — ABNORMAL LOW (ref 8.9–10.3)
Chloride: 111 mmol/L (ref 98–111)
Creatinine, Ser: 1.73 mg/dL — ABNORMAL HIGH (ref 0.61–1.24)
GFR, Estimated: 40 mL/min — ABNORMAL LOW (ref >60)
Glucose, Bld: 90 mg/dL (ref 70–99)
Potassium: 4.2 mmol/L (ref 3.5–5.1)
Sodium: 137 mmol/L (ref 135–145)

## 2023-08-05 LAB — BPAM RBC
Blood Product Expiration Date: 202501282359
ISSUE DATE / TIME: 202412272027
Unit Type and Rh: 6200

## 2023-08-05 MED ORDER — ASPIRIN 81 MG PO TBEC
81.0000 mg | DELAYED_RELEASE_TABLET | Freq: Every day | ORAL | Status: DC
  Administered 2023-08-06 – 2023-08-08 (×3): 81 mg via ORAL
  Filled 2023-08-05 (×3): qty 1

## 2023-08-05 NOTE — Consult Note (Signed)
Consultation  Referring Provider:  Hospitalist    Admit date: 12/27 Consult date        08/05/2023 Reason for Consultation:     IDA         HPI:   Michael Thomas is a 77 y.o. gentleman with history of PVD with recent femoral endarterectomy (500 ml blood loss), hypertension, CKD, and OSA who is here with IDA and b12 deficiency. Patient notes that since his recent vascular surgery he has been very fatigued with any activity. He would get some fatigue intermittently before the surgery but was more pronounced after the surgery. His hemoglobin seems to have been drifting down before the surgery. He notes intermittent "dark" stool for a few weeks but denies melena appearing stool. He takes aspirin and plavix with last dose of plavix on 12/26. No NSAID use. His last colonoscopy was 10 years ago and was unremarkable. No family history of GI malignancies. No significant abdominal surgeries.   Past Medical History:  Diagnosis Date   Benign prostatic hyperplasia with urinary obstruction 03/25/2015   Chronic kidney disease    Diabetes mellitus without complication (HCC)    History of kidney stones    Hyperlipidemia    Hypertension    OSA (obstructive sleep apnea)    Vitamin D deficiency     Past Surgical History:  Procedure Laterality Date   ABDOMINAL AORTOGRAM W/LOWER EXTREMITY N/A 05/26/2023   Procedure: ABDOMINAL AORTOGRAM W/LOWER EXTREMITY;  Surgeon: [name], MD;  Location: [facility];  Service: Cardiovascular;  Laterality: N/A;   ABDOMINAL AORTOGRAM W/LOWER EXTREMITY N/A 07/21/2023   Procedure: ABDOMINAL AORTOGRAM W/LOWER EXTREMITY;  Surgeon: [name], MD;  Location: [facility];  Service: Cardiovascular;  Laterality: N/A;   DG ARTHRO THUMB*L*     ENDARTERECTOMY FEMORAL Right 06/15/2023   Procedure: RIGHT COMMON FEMORAL ENDARTERECTOMY;  Surgeon: [name], MD;  Location: [facility] OR;  Service: Vascular;  Laterality: Right;   INTRAOPERATIVE ARTERIOGRAM  Right 06/15/2023   Procedure: INTRA OPERATIVE RIGHT LOWER EXTREMITY  ARTERIOGRAM;  Surgeon: [name], MD;  Location: [facility] OR;  Service: Vascular;  Laterality: Right;   IR ANGIOGRAM EXTREMITY BILATERAL  02/20/2018   IR ANGIOGRAM EXTREMITY BILATERAL  04/05/2019   IR ANGIOGRAM EXTREMITY LEFT  05/07/2020   IR ANGIOGRAM EXTREMITY LEFT  10/13/2022   IR ANGIOGRAM EXTREMITY RIGHT  02/15/2021   IR ANGIOGRAM EXTREMITY RIGHT  12/14/2021   IR ANGIOGRAM SELECTIVE EACH ADDITIONAL VESSEL  02/20/2018   IR ANGIOGRAM SELECTIVE EACH ADDITIONAL VESSEL  10/13/2022   IR FEM POP ART ATHERECT INC PTA MOD SED  02/20/2018   IR FEM POP ART ATHERECT INC PTA MOD SED  05/14/2019   IR FEM POP ART ATHERECT INC PTA MOD SED  05/07/2020   IR FEM POP ART ATHERECT INC PTA MOD SED  02/15/2021   IR FEM POP ART STENT INC PTA MOD SED  12/14/2021   IR ILIAC ART STENT INC PTA MOD SED  10/13/2022   IR INTRAVASCULAR ULTRASOUND NON CORONARY  02/15/2021   IR INTRAVASCULAR ULTRASOUND NON CORONARY  12/14/2021   IR INTRAVASCULAR ULTRASOUND NON CORONARY  10/13/2022   IR RADIOLOGIST EVAL & MGMT  02/01/2018   IR RADIOLOGIST EVAL & MGMT  03/20/2018   IR RADIOLOGIST EVAL & MGMT  07/25/2018   IR RADIOLOGIST EVAL & MGMT  03/07/2019   IR RADIOLOGIST EVAL & MGMT  04/23/2019   IR RADIOLOGIST EVAL & MGMT  06/18/2019   IR RADIOLOGIST EVAL &  MGMT  04/29/2020   IR RADIOLOGIST EVAL & MGMT  06/04/2020   IR RADIOLOGIST EVAL & MGMT  01/20/2021   IR RADIOLOGIST EVAL & MGMT  04/27/2021   IR RADIOLOGIST EVAL & MGMT  08/12/2021   IR RADIOLOGIST EVAL & MGMT  11/02/2021   IR RADIOLOGIST EVAL & MGMT  11/15/2021   IR RADIOLOGIST EVAL & MGMT  01/26/2022   IR RADIOLOGIST EVAL & MGMT  03/23/2022   IR RADIOLOGIST EVAL & MGMT  09/23/2022   IR RADIOLOGIST EVAL & MGMT  11/25/2022   IR RADIOLOGIST EVAL & MGMT  03/22/2023   IR US GUIDE VASC ACCESS LEFT  04/05/2019   IR US GUIDE VASC ACCESS LEFT  02/15/2021   IR US GUIDE VASC ACCESS LEFT  12/14/2021   IR US GUIDE VASC ACCESS RIGHT  02/20/2018   IR  US GUIDE VASC ACCESS RIGHT  05/14/2019   IR US GUIDE VASC ACCESS RIGHT  05/07/2020   IR US GUIDE VASC ACCESS RIGHT  10/13/2022   NECK SURGERY     PATCH ANGIOPLASTY Right 06/15/2023   Procedure: PATCH ANGIOPLASTY USING Livia Snellen 1X6CM;  Surgeon: [name], MD;  Location: [facility];  Service: Vascular;  Laterality: Right;   PERIPHERAL VASCULAR BALLOON ANGIOPLASTY  07/21/2023   Procedure: PERIPHERAL VASCULAR BALLOON ANGIOPLASTY;  Surgeon: [name], MD;  Location: [facility];  Service: Cardiovascular;;   PERIPHERAL VASCULAR INTERVENTION  07/21/2023   Procedure: PERIPHERAL VASCULAR INTERVENTION;  Surgeon: [name], MD;  Location: [facility];  Service: Cardiovascular;;   VASECTOMY      Family History  Problem Relation Age of Onset   Stroke Mother    Diabetes Mellitus II Mother    Prostate cancer Neg Hx    Kidney cancer Neg Hx    Bladder Cancer Neg Hx      Social History   Tobacco Use   Smoking status: Never   Smokeless tobacco: Never  Vaping Use   Vaping status: Never Used  Substance Use Topics   Alcohol use: No   Drug use: No    Prior to Admission medications   Medication Sig Start Date End Date Taking? Authorizing Provider  aspirin EC 81 MG tablet Take 81 mg by mouth at bedtime.   Yes [provider]  clobetasol ointment (TEMOVATE) 0.05 % APPLY TOPICALLY TWO TIMES A DAY AS NEEDED 03/09/23  Yes Monica Becton, MD  clopidogrel (PLAVIX) 75 MG tablet Take 1 tablet (75 mg total) by mouth daily. 07/21/23  Yes [name], MD  fenofibrate 160 MG tablet Take 1 tablet (160 mg total) by mouth daily. 11/28/22  Yes Monica Becton, MD  glipiZIDE (GLUCOTROL XL) 10 MG 24 hr tablet TAKE 1 TABLET BY MOUTH EVERY DAY WITH BREAKFAST 02/06/23  Yes Monica Becton, MD  lisinopril-hydrochlorothiazide (ZESTORETIC) 20-25 MG tablet TAKE 1 TABLET DAILY. 04/11/23  Yes Monica Becton, MD  metFORMIN (GLUCOPHAGE) 1000 MG tablet TAKE 1 TABLET  (1,000 MG TOTAL) BY MOUTH TWICE A DAY WITH FOOD 07/13/23  Yes Monica Becton, MD  omega-3 acid ethyl esters (LOVAZA) 1 g capsule TAKE 2 CAPSULES BY MOUTH TWICE A DAY 04/25/23  Yes Monica Becton, MD  pioglitazone (ACTOS) 30 MG tablet Take 30 mg by mouth at bedtime.   Yes [provider]  simvastatin (ZOCOR) 40 MG tablet TAKE 1 TABLET BY MOUTH EVERYDAY AT BEDTIME 04/11/23  Yes Monica Becton, MD  solifenacin (VESICARE) 5 MG tablet Take  1 tablet (5 mg total) by mouth daily. 01/11/23  Yes Monica Becton, MD  tamsulosin (FLOMAX) 0.4 MG CAPS capsule Take 1 capsule (0.4 mg total) by mouth 2 (two) times daily. 08/04/23  Yes Monica Becton, MD  AMBULATORY NON FORMULARY MEDICATION One Touch Ultra 2 glucometer 05/15/20   Monica Becton, MD  Blood Glucose Monitoring Suppl (ONETOUCH VERIO REFLECT) w/Device KIT Check fasting blood sugar every morning and 2 hours after largest meal of the day., 11/22/21   Monica Becton, MD  clopidogrel (PLAVIX) 75 MG tablet Take 1 tablet (75 mg total) by mouth daily at 6 (six) AM. Patient not taking: Reported on 08/04/2023 06/17/23   Emilie Rutter, PA-C  Lancets (ONETOUCH DELICA PLUS LANCET33G) MISC USE UP TO 3 TIMES DAILY. 05/05/22   Monica Becton, MD  ONETOUCH VERIO test strip CHECK FASTING BLOOD SUGAR EVERY MORNING AND 2 HOURS AFTER LARGEST MEAL OF THE DAY., 01/31/23   Monica Becton, MD    Current Facility-Administered Medications  Medication Dose Route Frequency Provider Last Rate Last Admin   0.9 %  sodium chloride infusion   Intravenous Continuous Mansy, Jan A, MD 100 mL/hr at 08/05/23 0723 Rate Verify at 08/05/23 0723   acetaminophen (TYLENOL) tablet 650 mg  650 mg Oral Q6H PRN Mansy, Jan A, MD       Or   acetaminophen (TYLENOL) suppository 650 mg  650 mg Rectal Q6H PRN Mansy, Jan A, MD       clobetasol ointment (TEMOVATE) 0.05 %   Topical BID PRN Mansy, Jan A, MD       fenofibrate tablet 160  mg  160 mg Oral Daily Mansy, Jan A, MD       fesoterodine (TOVIAZ) tablet 4 mg  4 mg Oral Daily Mansy, Jan A, MD   4 mg at 08/05/23 0940   glipiZIDE (GLUCOTROL XL) 24 hr tablet 10 mg  10 mg Oral Q breakfast Mansy, Jan A, MD   10 mg at 08/05/23 0940   lisinopril (ZESTRIL) tablet 20 mg  20 mg Oral Daily Otelia Sergeant, RPH   20 mg at 08/05/23 0941   And   hydrochlorothiazide (HYDRODIURIL) tablet 25 mg  25 mg Oral Daily Otelia Sergeant, RPH   25 mg at 08/05/23 0941   omega-3 acid ethyl esters (LOVAZA) capsule 2 g  2 capsule Oral BID Mansy, Jan A, MD   2 g at 08/05/23 0939   ondansetron (ZOFRAN) tablet 4 mg  4 mg Oral Q6H PRN Mansy, Jan A, MD       Or   ondansetron [facility]) injection 4 mg  4 mg Intravenous Q6H PRN Mansy, Jan A, MD       pantoprazole (PROTONIX) injection 40 mg  40 mg Intravenous Q12H Mansy, Jan A, MD   40 mg at 08/05/23 0941   pioglitazone (ACTOS) tablet 30 mg  30 mg Oral Daily Mansy, Jan A, MD       simvastatin (ZOCOR) tablet 40 mg  40 mg Oral q1800 Mansy, Jan A, MD       tamsulosin [facility]) capsule 0.4 mg  0.4 mg Oral BID Mansy, Jan A, MD   0.4 mg at 08/05/23 0941   traZODone (DESYREL) tablet 25 mg  25 mg Oral QHS PRN Mansy, Jan A, MD   25 mg at 08/05/23 0153   Current Outpatient Medications  Medication Sig Dispense Refill   aspirin EC 81 MG tablet Take 81 mg by mouth at bedtime.  clobetasol ointment (TEMOVATE) 0.05 % APPLY TOPICALLY TWO TIMES A DAY AS NEEDED 45 g 2   clopidogrel (PLAVIX) 75 MG tablet Take 1 tablet (75 mg total) by mouth daily. 30 tablet 11   fenofibrate 160 MG tablet Take 1 tablet (160 mg total) by mouth daily. 90 tablet 3   glipiZIDE (GLUCOTROL XL) 10 MG 24 hr tablet TAKE 1 TABLET BY MOUTH EVERY DAY WITH BREAKFAST 90 tablet 3   lisinopril-hydrochlorothiazide (ZESTORETIC) 20-25 MG tablet TAKE 1 TABLET DAILY. 90 tablet 3   metFORMIN (GLUCOPHAGE) 1000 MG tablet TAKE 1 TABLET (1,000 MG TOTAL) BY MOUTH TWICE A DAY WITH FOOD 180 tablet 1   omega-3 acid ethyl  esters (LOVAZA) 1 g capsule TAKE 2 CAPSULES BY MOUTH TWICE A DAY 360 capsule 3   pioglitazone (ACTOS) 30 MG tablet Take 30 mg by mouth at bedtime.     simvastatin (ZOCOR) 40 MG tablet TAKE 1 TABLET BY MOUTH EVERYDAY AT BEDTIME 90 tablet 0   solifenacin (VESICARE) 5 MG tablet Take 1 tablet (5 mg total) by mouth daily.     tamsulosin (FLOMAX) 0.4 MG CAPS capsule Take 1 capsule (0.4 mg total) by mouth 2 (two) times daily. 180 capsule 3   AMBULATORY NON FORMULARY MEDICATION One Touch Ultra 2 glucometer 1 each 0   Blood Glucose Monitoring Suppl (ONETOUCH VERIO REFLECT) w/Device KIT Check fasting blood sugar every morning and 2 hours after largest meal of the day., 1 kit 0   clopidogrel (PLAVIX) 75 MG tablet Take 1 tablet (75 mg total) by mouth daily at 6 (six) AM. (Patient not taking: Reported on 08/04/2023) 30 tablet 1   Lancets (ONETOUCH DELICA PLUS LANCET33G) MISC USE UP TO 3 TIMES DAILY. 100 each 11   ONETOUCH VERIO test strip CHECK FASTING BLOOD SUGAR EVERY MORNING AND 2 HOURS AFTER LARGEST MEAL OF THE DAY., 100 strip 12    Allergies as of 08/04/2023 - Review Complete 08/04/2023  Allergen Reaction Noted   Amoxicillin Other (See Comments) 04/04/2018     Review of Systems:    All systems reviewed and negative except where noted in HPI.  Review of Systems  Constitutional:  Positive for malaise/fatigue. Negative for chills and fever.  Respiratory:  Negative for shortness of breath.   Cardiovascular:  Negative for chest pain.  Gastrointestinal:  Negative for abdominal pain, blood in stool, nausea and vomiting.  Musculoskeletal:  Negative for joint pain.  Skin:  Negative for rash.  Neurological:  Negative for focal weakness.  Psychiatric/Behavioral:  Negative for substance abuse.   All other systems reviewed and are negative.      Physical Exam:  Vital signs in last 24 hours: Temp:  [97.4 F (36.3 C)-98.6 F (37 C)] 97.4 F (36.3 C) (12/28 0938) Pulse Rate:  [57-81] 57 (12/28  0800) Resp:  [15-21] 15 (12/28 0800) BP: (119-164)/(47-83) 121/61 (12/28 0800) SpO2:  [98 %-100 %] 99 % (12/28 0800) Last BM Date : 08/04/23 General:   Pleasant in NAD Head:  Normocephalic and atraumatic. Eyes:   No icterus.   Conjunctiva pink. Mouth: Mucosa pink moist, no lesions. Neck:  Supple; no masses felt Lungs: No respiratory distress Abdomen:   Flat, soft, nondistended, nontender Msk:  No clubbing or cyanosis. Strength 5/5. Symmetrical without gross deformities. Neurologic:  Alert and  oriented x4;  No focal deficits Skin:  Warm, dry, pink without significant lesions or rashes. Psych:  Alert and cooperative. Normal affect.  LAB RESULTS: Recent Labs    08/03/23 1348 08/04/23  1430 08/05/23 0421  WBC 4.9 4.4 5.0  HGB 6.8* 6.7* 7.0*  HCT 22.4* 21.2* 21.0*  PLT 639* 568* 463*   BMET Recent Labs    08/03/23 1348 08/04/23 1430 08/05/23 0421  NA 140 136 137  K 5.2 4.6 4.2  CL 106 107 111  CO2 18* 20* 21*  GLUCOSE 140* 110* 90  BUN 50* 51* 46*  CREATININE 2.09* 1.77* 1.73*  CALCIUM 9.9 8.9 8.6*   LFT Recent Labs    08/04/23 1430  PROT 6.7  ALBUMIN 3.7  AST 14*  ALT 11  ALKPHOS 28*  BILITOT 0.3   PT/INR Recent Labs    08/03/23 1348  LABPROT 10.9  INR 1.0    STUDIES: No results found.     Impression / Plan:   77 y.o. gentleman with history of PVD with recent femoral endarterectomy (500 ml blood loss), hypertension, CKD, and OSA who is here with IDA and b12 deficiency. Suspect anemia is multifactorial from chronic medical conditions, recent surgery, chronic blood thinners, and b12 deficiency  - will plan EGD/Colonoscopy on Monday. Discussed with patient that plavix will still be in his system and that we can remove small lesions but not large lesions. - continue to hold plavix, I would be ok with continuing baby aspirin given recent endarterectomy - maintain active type and screen - transfuse for hemoglobin < 7 - soft diet today, clears tomorrow,  NPO tomorrow at midnight - replete vitamin b12 - monitor for any overt GI bleeding  Merlyn Lot MD, MPH [facility]

## 2023-08-05 NOTE — ED Notes (Signed)
Patient complaints of not being able to sleep when this RN entered room to check on him. Trazodone administered to ensure rest. VSS, CCM in use, call light within reach.

## 2023-08-05 NOTE — Progress Notes (Signed)
PROGRESS NOTE    Michael Thomas  NWG:[id number] DOB: 02/15/1946 DOA: 08/04/2023 PCP: Monica Becton, MD  Chief Complaint  Patient presents with   Abnormal Lab    Hospital Course:  Michael Thomas is 77 y.o. male with history of type 2 diabetes, chronic kidney disease, hypertension, dyslipidemia, OSA, BPH, who presented to the ER with acute onset of generalized fatigue and associated intermittent melena.  He was recently seen by his primary care physician had an H&H tach.  He was then prompted to come to the ER for anemia.  He denies any bright red blood per rectum.  No nausea, no vomiting.  Denies any syncopal episodes.  Reports his last colonoscopy was over 15 years ago.  On 12/13 patient underwent abdominal aortogram with balloon angioplasty.  He was subsequently initiated on dual antiplatelet therapy. - In the ED patient was found to have hemodynamically stable vitals, creatinine 1.7, and CBC with hemoglobin of 6.7.  He was transfused 1 unit PRBC.  GI was consulted.  Subjective: Patient reports no symptoms at rest.   Objective: Vitals:   08/05/23 1345 08/05/23 1400 08/05/23 1549 08/05/23 1600  BP: 115/63 (!) 118/57  138/67  Pulse:  64  72  Resp:  19  20  Temp:   98 F (36.7 C)   TempSrc:   Oral   SpO2:  98%  98%    Intake/Output Summary (Last 24 hours) at 08/05/2023 1742 Last data filed at 08/05/2023 1028 Gross per 24 hour  Intake --  Output 825 ml  Net -825 ml   There were no vitals filed for this visit.  Examination: General exam: Appears calm and comfortable, NAD  Respiratory system: No work of breathing, symmetric chest wall expansion Cardiovascular system: S1 & S2 heard, RRR.  Gastrointestinal system: Abdomen is nondistended, soft and nontender.  Neuro: Alert and oriented. No focal neurological deficits. Extremities: Symmetric, expected ROM Skin: No rashes, lesions Psychiatry: Demonstrates appropriate judgement and insight. Mood & affect  appropriate for situation.   Assessment & Plan:  Principal Problem:   GI bleeding Active Problems:   ABLA (acute blood loss anemia)   Type 2 diabetes mellitus with chronic kidney disease, with long-term current use of insulin (HCC)   BPH (benign prostatic hyperplasia) with detrusor instability/OAB   Dyslipidemia   Essential hypertension    GI bleed, related to dual antiplatelet therapy use - GI consulted - Planning for EGD and colonoscopy on Monday - Continue with IV Protonix for now - Hold Plavix - GI has cleared the patient for aspirin given recent endarterectomy.  Acute blood loss anemia superimposed on iron deficiency anemia and B12 deficiency. - Status post 1 unit PRBC - Repeat CBC in a.m. - Patient currently asymptomatic - Cont supplementation - Secondary to #1  Type 2 diabetes with chronic kidney disease with long-term use of insulin - Sliding scale insulin, titrate up as tolerated - Hold metformin while admitted - Avoid nephrotoxic meds - Trend CMP in a.m.  Hypertension - Continue home meds  Dyslipidemia - Continue home meds  BPH - Flomax - Patient has history of urinary retention with anesthesia.  May require Foley catheter after colonoscopy.  Peripheral artery disease with severe claudication - Patient has recently undergone balloon angioplasty with stent placement R leg on 12/13.  Follows outpatient with vascular surgery - Is on dual antiplatelet therapy.  Hold Plavix for now as above. - Continue with aspirin  OSA - CPAP at night   DVT prophylaxis: No  AC given GI bleed. SCD on left leg only   Code Status: Full Code Family Communication: Patient's wife at bedside for discussion  Disposition:  Status is: Inpatient, EGD/Colonoscopy Monday  Consultants:    Treatment Team:  Consulting Physician: Regis Bill, MD  Procedures:    Antimicrobials:  Anti-infectives (From admission, onward)    None       Data Reviewed: I have personally  reviewed following labs and imaging studies CBC: Recent Labs  Lab 08/03/23 1348 08/04/23 1430 08/05/23 0421  WBC 4.9 4.4 5.0  HGB 6.8* 6.7* 7.0*  HCT 22.4* 21.2* 21.0*  MCV 88 86.9 85.4  PLT 639* 568* 463*   Basic Metabolic Panel: Recent Labs  Lab 08/03/23 1348 08/04/23 1430 08/05/23 0421  NA 140 136 137  K 5.2 4.6 4.2  CL 106 107 111  CO2 18* 20* 21*  GLUCOSE 140* 110* 90  BUN 50* 51* 46*  CREATININE 2.09* 1.77* 1.73*  CALCIUM 9.9 8.9 8.6*   GFR: Estimated Creatinine Clearance: 26.7 mL/min (A) (by C-G formula based on SCr of 1.73 mg/dL (H)). Liver Function Tests: Recent Labs  Lab 08/03/23 1348 08/04/23 1430  AST 13 14*  ALT 10 11  ALKPHOS 34* 28*  BILITOT <0.2 0.3  PROT 6.7 6.7  ALBUMIN 4.1 3.7   CBG: No results for input(s): "GLUCAP" in the last 168 hours.  No results found for this or any previous visit (from the past 240 hours).   Radiology Studies: No results found.  Scheduled Meds:  fenofibrate  160 mg Oral Daily   fesoterodine  4 mg Oral Daily   glipiZIDE  10 mg Oral Q breakfast   lisinopril  20 mg Oral Daily   And   hydrochlorothiazide  25 mg Oral Daily   omega-3 acid ethyl esters  2 capsule Oral BID   pantoprazole (PROTONIX) IV  40 mg Intravenous Q12H   pioglitazone  30 mg Oral Daily   simvastatin  40 mg Oral q1800   tamsulosin  0.4 mg Oral BID   Continuous Infusions:  sodium chloride 100 mL/hr at 08/05/23 1550     LOS: 1 day    Time spent:   Debarah Crape, DO [facility]  To contact the attending physician between 7A-7P please use Epic Chat. To contact the covering physician during after hours 7P-7A, please review Amion.   08/05/2023, 5:42 PM   *This document has been created with the assistance of dictation software. Please excuse typographical errors. *

## 2023-08-06 DIAGNOSIS — K921 Melena: Secondary | ICD-10-CM | POA: Diagnosis not present

## 2023-08-06 LAB — CBC WITH DIFFERENTIAL/PLATELET
Abs Immature Granulocytes: 0.03 10*3/uL (ref 0.00–0.07)
Basophils Absolute: 0 10*3/uL (ref 0.0–0.1)
Basophils Relative: 1 %
Eosinophils Absolute: 0.2 10*3/uL (ref 0.0–0.5)
Eosinophils Relative: 4 %
HCT: 22.4 % — ABNORMAL LOW (ref 39.0–52.0)
Hemoglobin: 7.3 g/dL — ABNORMAL LOW (ref 13.0–17.0)
Immature Granulocytes: 1 %
Lymphocytes Relative: 29 %
Lymphs Abs: 1.8 10*3/uL (ref 0.7–4.0)
MCH: 27.5 pg (ref 26.0–34.0)
MCHC: 32.6 g/dL (ref 30.0–36.0)
MCV: 84.5 fL (ref 80.0–100.0)
Monocytes Absolute: 0.5 10*3/uL (ref 0.1–1.0)
Monocytes Relative: 7 %
Neutro Abs: 3.6 10*3/uL (ref 1.7–7.7)
Neutrophils Relative %: 58 %
Platelets: 472 10*3/uL — ABNORMAL HIGH (ref 150–400)
RBC: 2.65 MIL/uL — ABNORMAL LOW (ref 4.22–5.81)
RDW: 15.6 % — ABNORMAL HIGH (ref 11.5–15.5)
WBC: 6.2 10*3/uL (ref 4.0–10.5)
nRBC: 0 % (ref 0.0–0.2)

## 2023-08-06 LAB — COMPREHENSIVE METABOLIC PANEL
ALT: 11 U/L (ref 0–44)
AST: 14 U/L — ABNORMAL LOW (ref 15–41)
Albumin: 3 g/dL — ABNORMAL LOW (ref 3.5–5.0)
Alkaline Phosphatase: 19 U/L — ABNORMAL LOW (ref 38–126)
Anion gap: 8 (ref 5–15)
BUN: 36 mg/dL — ABNORMAL HIGH (ref 8–23)
CO2: 19 mmol/L — ABNORMAL LOW (ref 22–32)
Calcium: 8.5 mg/dL — ABNORMAL LOW (ref 8.9–10.3)
Chloride: 110 mmol/L (ref 98–111)
Creatinine, Ser: 1.64 mg/dL — ABNORMAL HIGH (ref 0.61–1.24)
GFR, Estimated: 42 mL/min — ABNORMAL LOW (ref >60)
Glucose, Bld: 94 mg/dL (ref 70–99)
Potassium: 4.2 mmol/L (ref 3.5–5.1)
Sodium: 137 mmol/L (ref 135–145)
Total Bilirubin: 0.4 mg/dL (ref <1.2)
Total Protein: 5.7 g/dL — ABNORMAL LOW (ref 6.5–8.1)

## 2023-08-06 LAB — PHOSPHORUS: Phosphorus: 3.8 mg/dL (ref 2.5–4.6)

## 2023-08-06 LAB — MAGNESIUM: Magnesium: 1.8 mg/dL (ref 1.7–2.4)

## 2023-08-06 MED ORDER — CYANOCOBALAMIN 1000 MCG/ML IJ SOLN
1000.0000 ug | Freq: Once | INTRAMUSCULAR | Status: AC
  Administered 2023-08-06: 1000 ug via INTRAMUSCULAR
  Filled 2023-08-06: qty 1

## 2023-08-06 MED ORDER — PEG 3350-KCL-NA BICARB-NACL 420 G PO SOLR
4000.0000 mL | Freq: Once | ORAL | Status: AC
  Administered 2023-08-06: 4000 mL via ORAL
  Filled 2023-08-06: qty 4000

## 2023-08-06 NOTE — TOC CM/SW Note (Signed)
Transition of Care [facility]) - Inpatient Brief Assessment   Patient Details  Name: Michael Thomas MRN: [id number] Date of Birth: 1945/10/04  Transition of [facility]) CM/SW Contact:    Rodney Langton, RN Phone Number: 08/06/2023, 11:19 AM   Clinical Narrative:  Brief assessment done, no TOC needs identified at this time.  Please place consult if needs arise.  Transition of Care Asessment: Insurance and Status: Insurance coverage has been reviewed Patient has primary care physician: Yes Home environment has been reviewed: Yes Prior level of function:: Independent   Social Drivers of Health Review: SDOH reviewed no interventions necessary Readmission risk has been reviewed: Yes Transition of care needs: no transition of care needs at this time

## 2023-08-06 NOTE — Progress Notes (Addendum)
PROGRESS NOTE    Michael Thomas  NWG:[id number] DOB: Jan 10, 1946 DOA: 08/04/2023 PCP: Monica Becton, MD  Chief Complaint  Patient presents with   Abnormal Lab    Hospital Course:  Michael Thomas is 77 y.o. male with history of type 2 diabetes, chronic kidney disease, hypertension, dyslipidemia, OSA, BPH, who presented to the ER with acute onset of generalized fatigue and associated intermittent melena.  He was recently seen by his primary care physician had an H&H tach.  He was then prompted to come to the ER for anemia.  He denies any bright red blood per rectum.  No nausea, no vomiting.  Denies any syncopal episodes.  Reports his last colonoscopy was over 15 years ago.  On 12/13 patient underwent abdominal aortogram with balloon angioplasty.  He was subsequently initiated on dual antiplatelet therapy. - In the ED patient was found to have hemodynamically stable vitals, creatinine 1.7, and CBC with hemoglobin of 6.7.  He was transfused 1 unit PRBC.  GI was consulted.  Subjective: No acute events overnight. On evaluation today patient in complaining of constipation. Discussed upcoming prep with him.  Objective: Vitals:   08/05/23 1959 08/05/23 2217 08/05/23 2220 08/06/23 0608  BP:  (!) 142/61  109/64  Pulse:  70  (!) 110  Resp:  18    Temp: 98.6 F (37 C) 98.3 F (36.8 C)  97.7 F (36.5 C)  TempSrc: Oral     SpO2:  98%  (!) 85%  Weight:   60.5 kg   Height:   5\' 1"  (1.549 m)     Intake/Output Summary (Last 24 hours) at 08/06/2023 0732 Last data filed at 08/05/2023 2304 Gross per 24 hour  Intake 1490 ml  Output 825 ml  Net 665 ml   Filed Weights   08/05/23 2220  Weight: 60.5 kg    Examination: General exam: Appears calm and comfortable, NAD  Respiratory system: No work of breathing, symmetric chest wall expansion Cardiovascular system: S1 & S2 heard, RRR.  Gastrointestinal system: Abdomen is nondistended, soft and nontender.  Neuro: Alert and  oriented. No focal neurological deficits. Extremities: Symmetric, expected ROM Skin: No rashes, lesions Psychiatry: Demonstrates appropriate judgement and insight. Mood & affect appropriate for situation.   Assessment & Plan:  Principal Problem:   GI bleeding Active Problems:   ABLA (acute blood loss anemia)   Type 2 diabetes mellitus with chronic kidney disease, with long-term current use of insulin (HCC)   BPH (benign prostatic hyperplasia) with detrusor instability/OAB   Symptomatic anemia   Dyslipidemia   Essential hypertension    GI bleed, related to dual antiplatelet therapy use - GI consulted - Planning for EGD and colonoscopy tomorrow. Prep today. NPO at midnight. - Continue with IV Protonix for now - Hold Plavix - GI has cleared the patient for aspirin given recent endarterectomy.  Acute blood loss anemia superimposed on iron deficiency anemia and B12 deficiency. - Status post 1 unit PRBC - Trend CBC, Hgb stable - Patient currently asymptomatic - Cont supplementation - Secondary to #1  Type 2 diabetes with chronic kidney disease with long-term use of insulin - Sliding scale insulin, titrate up as tolerated - Hold metformin while admitted - Avoid nephrotoxic meds - Trend CMP in a.m.  Hypertension - Continue home meds  Dyslipidemia - Continue home meds  BPH - Flomax - Patient has history of urinary retention with anesthesia.  May require Foley catheter after colonoscopy.  Peripheral artery disease with severe claudication -  Patient has recently undergone balloon angioplasty with stent placement R leg on 12/13.  Follows outpatient with vascular surgery - Is on dual antiplatelet therapy.  Hold Plavix for now as above. - Continue with aspirin  OSA - CPAP at night   DVT prophylaxis: No AC given GI bleed. SCD on left leg only   Code Status: Full Code Family Communication: none at bedside. Discussed with him directly. Disposition:  Status is: Inpatient,  EGD/Colonoscopy Monday  Consultants:    Treatment Team:  Consulting Physician: Regis Bill, MD  Procedures:    Antimicrobials:  Anti-infectives (From admission, onward)    None       Data Reviewed: I have personally reviewed following labs and imaging studies CBC: Recent Labs  Lab 08/03/23 1348 08/04/23 1430 08/05/23 0421 08/06/23 0539  WBC 4.9 4.4 5.0 6.2  NEUTROABS  --   --   --  3.6  HGB 6.8* 6.7* 7.0* 7.3*  HCT 22.4* 21.2* 21.0* 22.4*  MCV 88 86.9 85.4 84.5  PLT 639* 568* 463* 472*   Basic Metabolic Panel: Recent Labs  Lab 08/03/23 1348 08/04/23 1430 08/05/23 0421 08/06/23 0539  NA 140 136 137 137  K 5.2 4.6 4.2 4.2  CL 106 107 111 110  CO2 18* 20* 21* 19*  GLUCOSE 140* 110* 90 94  BUN 50* 51* 46* 36*  CREATININE 2.09* 1.77* 1.73* 1.64*  CALCIUM 9.9 8.9 8.6* 8.5*  MG  --   --   --  1.8  PHOS  --   --   --  3.8   GFR: Estimated Creatinine Clearance: 27 mL/min (A) (by C-G formula based on SCr of 1.64 mg/dL (H)). Liver Function Tests: Recent Labs  Lab 08/03/23 1348 08/04/23 1430 08/06/23 0539  AST 13 14* 14*  ALT 10 11 11   ALKPHOS 34* 28* 19*  BILITOT <0.2 0.3 0.4  PROT 6.7 6.7 5.7*  ALBUMIN 4.1 3.7 3.0*   CBG: No results for input(s): "GLUCAP" in the last 168 hours.  No results found for this or any previous visit (from the past 240 hours).   Radiology Studies: No results found.  Scheduled Meds:  aspirin EC  81 mg Oral Daily   fenofibrate  160 mg Oral Daily   fesoterodine  4 mg Oral Daily   glipiZIDE  10 mg Oral Q breakfast   lisinopril  20 mg Oral Daily   And   hydrochlorothiazide  25 mg Oral Daily   omega-3 acid ethyl esters  2 capsule Oral BID   pantoprazole (PROTONIX) IV  40 mg Intravenous Q12H   pioglitazone  30 mg Oral Daily   simvastatin  40 mg Oral q1800   tamsulosin  0.4 mg Oral BID   Continuous Infusions:     LOS: 2 days    Time spent:   Debarah Crape, DO [facility]  To contact the  attending physician between 7A-7P please use Epic Chat. To contact the covering physician during after hours 7P-7A, please review Amion.   08/06/2023, 7:32 AM   *This document has been created with the assistance of dictation software. Please excuse typographical errors. *

## 2023-08-06 NOTE — Plan of Care (Signed)
Pt's first night on the unit.  Will assess and update plan of care as necessary.  Problem: Education: Goal: Knowledge of General Education information will improve Description: Including pain rating scale, medication(s)/side effects and non-pharmacologic comfort measures Outcome: Progressing   Problem: Health Behavior/Discharge Planning: Goal: Ability to manage health-related needs will improve Outcome: Progressing   Problem: Clinical Measurements: Goal: Ability to maintain clinical measurements within normal limits will improve Outcome: Progressing Goal: Will remain free from infection Outcome: Progressing Goal: Diagnostic test results will improve Outcome: Progressing Goal: Respiratory complications will improve Outcome: Progressing Goal: Cardiovascular complication will be avoided Outcome: Progressing   Problem: Activity: Goal: Risk for activity intolerance will decrease Outcome: Progressing   Problem: Nutrition: Goal: Adequate nutrition will be maintained Outcome: Progressing   Problem: Coping: Goal: Level of anxiety will decrease Outcome: Progressing   Problem: Elimination: Goal: Will not experience complications related to bowel motility Outcome: Progressing Goal: Will not experience complications related to urinary retention Outcome: Progressing   Problem: Pain Management: Goal: General experience of comfort will improve Outcome: Progressing   Problem: Safety: Goal: Ability to remain free from injury will improve Outcome: Progressing   Problem: Skin Integrity: Goal: Risk for impaired skin integrity will decrease Outcome: Progressing

## 2023-08-06 NOTE — Care Plan (Signed)
Orders entered for EGD/Colon tomorrow. NPO at midnight except prep. Drink 1/2 prep this evening and other 1/2 tomorrow morning. Should stop drinking at 8 am.  Merlyn Lot MD, MPH [facility]

## 2023-08-07 ENCOUNTER — Inpatient Hospital Stay: Payer: PPO | Admitting: Registered Nurse

## 2023-08-07 ENCOUNTER — Encounter: Payer: Self-pay | Admitting: Family Medicine

## 2023-08-07 ENCOUNTER — Encounter
Admission: EM | Disposition: A | Discharge status: Home / Self Care | Payer: Self-pay | Source: Home / Self Care | Attending: Family Medicine

## 2023-08-07 DIAGNOSIS — N1832 Chronic kidney disease, stage 3b: Secondary | ICD-10-CM | POA: Diagnosis not present

## 2023-08-07 DIAGNOSIS — E1122 Type 2 diabetes mellitus with diabetic chronic kidney disease: Secondary | ICD-10-CM | POA: Diagnosis not present

## 2023-08-07 DIAGNOSIS — K2970 Gastritis, unspecified, without bleeding: Secondary | ICD-10-CM | POA: Diagnosis not present

## 2023-08-07 DIAGNOSIS — D509 Iron deficiency anemia, unspecified: Secondary | ICD-10-CM | POA: Diagnosis not present

## 2023-08-07 DIAGNOSIS — I129 Hypertensive chronic kidney disease with stage 1 through stage 4 chronic kidney disease, or unspecified chronic kidney disease: Secondary | ICD-10-CM | POA: Diagnosis not present

## 2023-08-07 DIAGNOSIS — E1151 Type 2 diabetes mellitus with diabetic peripheral angiopathy without gangrene: Secondary | ICD-10-CM | POA: Diagnosis not present

## 2023-08-07 DIAGNOSIS — K921 Melena: Secondary | ICD-10-CM | POA: Diagnosis not present

## 2023-08-07 DIAGNOSIS — Z7984 Long term (current) use of oral hypoglycemic drugs: Secondary | ICD-10-CM | POA: Diagnosis not present

## 2023-08-07 DIAGNOSIS — Z794 Long term (current) use of insulin: Secondary | ICD-10-CM | POA: Diagnosis not present

## 2023-08-07 HISTORY — PX: BIOPSY: SHX5522

## 2023-08-07 HISTORY — PX: COLONOSCOPY WITH PROPOFOL: SHX5780

## 2023-08-07 HISTORY — PX: ESOPHAGOGASTRODUODENOSCOPY (EGD) WITH PROPOFOL: SHX5813

## 2023-08-07 LAB — CBC WITH DIFFERENTIAL/PLATELET
Abs Immature Granulocytes: 0.03 10*3/uL (ref 0.00–0.07)
Basophils Absolute: 0 10*3/uL (ref 0.0–0.1)
Basophils Relative: 0 %
Eosinophils Absolute: 0.2 10*3/uL (ref 0.0–0.5)
Eosinophils Relative: 4 %
HCT: 25.1 % — ABNORMAL LOW (ref 39.0–52.0)
Hemoglobin: 8.2 g/dL — ABNORMAL LOW (ref 13.0–17.0)
Immature Granulocytes: 1 %
Lymphocytes Relative: 30 %
Lymphs Abs: 1.6 10*3/uL (ref 0.7–4.0)
MCH: 27.7 pg (ref 26.0–34.0)
MCHC: 32.7 g/dL (ref 30.0–36.0)
MCV: 84.8 fL (ref 80.0–100.0)
Monocytes Absolute: 0.5 10*3/uL (ref 0.1–1.0)
Monocytes Relative: 10 %
Neutro Abs: 3 10*3/uL (ref 1.7–7.7)
Neutrophils Relative %: 55 %
Platelets: 505 10*3/uL — ABNORMAL HIGH (ref 150–400)
RBC: 2.96 MIL/uL — ABNORMAL LOW (ref 4.22–5.81)
RDW: 15.3 % (ref 11.5–15.5)
WBC: 5.4 10*3/uL (ref 4.0–10.5)
nRBC: 0 % (ref 0.0–0.2)

## 2023-08-07 LAB — COMPREHENSIVE METABOLIC PANEL
ALT: 13 U/L (ref 0–44)
AST: 16 U/L (ref 15–41)
Albumin: 3.5 g/dL (ref 3.5–5.0)
Alkaline Phosphatase: 23 U/L — ABNORMAL LOW (ref 38–126)
Anion gap: 7 (ref 5–15)
BUN: 27 mg/dL — ABNORMAL HIGH (ref 8–23)
CO2: 22 mmol/L (ref 22–32)
Calcium: 8.7 mg/dL — ABNORMAL LOW (ref 8.9–10.3)
Chloride: 108 mmol/L (ref 98–111)
Creatinine, Ser: 1.43 mg/dL — ABNORMAL HIGH (ref 0.61–1.24)
GFR, Estimated: 50 mL/min — ABNORMAL LOW (ref >60)
Glucose, Bld: 98 mg/dL (ref 70–99)
Potassium: 4.3 mmol/L (ref 3.5–5.1)
Sodium: 137 mmol/L (ref 135–145)
Total Bilirubin: 0.7 mg/dL (ref <1.2)
Total Protein: 6.5 g/dL (ref 6.5–8.1)

## 2023-08-07 LAB — GLUCOSE, CAPILLARY
Glucose-Capillary: 97 mg/dL (ref 70–99)
Glucose-Capillary: 98 mg/dL (ref 70–99)
Glucose-Capillary: 109 mg/dL — ABNORMAL HIGH (ref 70–99)

## 2023-08-07 LAB — PHOSPHORUS: Phosphorus: 3.1 mg/dL (ref 2.5–4.6)

## 2023-08-07 LAB — MAGNESIUM: Magnesium: 1.8 mg/dL (ref 1.7–2.4)

## 2023-08-07 SURGERY — ESOPHAGOGASTRODUODENOSCOPY (EGD) WITH PROPOFOL
Anesthesia: General

## 2023-08-07 MED ORDER — FERROUS SULFATE 325 (65 FE) MG PO TBEC: 325.0000 mg | DELAYED_RELEASE_TABLET | Freq: Every day | ORAL | 0 refills | Status: DC

## 2023-08-07 MED ORDER — POLYETHYLENE GLYCOL 3350 17 G PO PACK: 17.0000 g | PACK | Freq: Every day | ORAL | 0 refills | Status: AC

## 2023-08-07 MED ORDER — LIDOCAINE HCL (CARDIAC) PF 100 MG/5ML IV SOSY
PREFILLED_SYRINGE | INTRAVENOUS | Status: DC | PRN
  Administered 2023-08-07: 40 mg via INTRAVENOUS

## 2023-08-07 MED ORDER — PROPOFOL 500 MG/50ML IV EMUL
INTRAVENOUS | Status: DC | PRN
  Administered 2023-08-07: 100 ug/kg/min via INTRAVENOUS

## 2023-08-07 MED ORDER — GLYCOPYRROLATE 0.2 MG/ML IJ SOLN
INTRAMUSCULAR | Status: DC | PRN
  Administered 2023-08-07: .15 mg via INTRAVENOUS

## 2023-08-07 MED ORDER — SODIUM CHLORIDE 0.9 % IV SOLN: INTRAVENOUS | Status: DC

## 2023-08-07 MED ORDER — PROPOFOL 10 MG/ML IV BOLUS
INTRAVENOUS | Status: DC | PRN
  Administered 2023-08-07: 60 mg via INTRAVENOUS

## 2023-08-07 NOTE — Op Note (Signed)
[facility] Patient Name: Michael Thomas Procedure Date: 08/07/2023 1:17 PM MRN: [id number] Account #: [id number] Date of Birth: 21-Jan-1946 Admit Type: Inpatient Age: 77 Room: [facility] ENDO ROOM 3 Gender: Male Note Status: Finalized Instrument Name: Prentice Docker [id number] Procedure:             Colonoscopy Indications:           Iron deficiency anemia Providers:             Eather Colas MD, MD Referring MD:          No Local Md, MD (Referring MD) Medicines:             Monitored Anesthesia Care Complications:         No immediate complications. Procedure:             Pre-Anesthesia Assessment:                        - Prior to the procedure, a History and Physical was                         performed, and patient medications and allergies were                         reviewed. The patient is competent. The risks and                         benefits of the procedure and the sedation options and                         risks were discussed with the patient. All questions                         were answered and informed consent was obtained.                         Patient identification and proposed procedure were                         verified by the physician, the nurse, the                         anesthesiologist, the anesthetist and the technician                         in the endoscopy suite. Mental Status Examination:                         alert and oriented. Airway Examination: normal                         oropharyngeal airway and neck mobility. Respiratory                         Examination: clear to auscultation. CV Examination:                         normal. Prophylactic Antibiotics: The patient does not  require prophylactic antibiotics. Prior                         Anticoagulants: The patient has taken Plavix                         (clopidogrel), last dose was 4 days prior to                          procedure. ASA Grade Assessment: III - A patient with                         severe systemic disease. After reviewing the risks and                         benefits, the patient was deemed in satisfactory                         condition to undergo the procedure. The anesthesia                         plan was to use monitored anesthesia care (MAC).                         Immediately prior to administration of medications,                         the patient was re-assessed for adequacy to receive                         sedatives. The heart rate, respiratory rate, oxygen                         saturations, blood pressure, adequacy of pulmonary                         ventilation, and response to care were monitored                         throughout the procedure. The physical status of the                         patient was re-assessed after the procedure.                        After obtaining informed consent, the colonoscope was                         passed under direct vision. Throughout the procedure,                         the patient's blood pressure, pulse, and oxygen                         saturations were monitored continuously. The                         Colonoscope was introduced through the anus and  advanced to the the terminal ileum, with                         identification of the appendiceal orifice and IC                         valve. The colonoscopy was performed without                         difficulty. The patient tolerated the procedure well.                         The quality of the bowel preparation was adequate to                         identify polyps. The terminal ileum, ileocecal valve,                         appendiceal orifice, and rectum were photographed. Findings:      The perianal and digital rectal examinations were normal.      The terminal ileum appeared normal.      Internal hemorrhoids were found during  retroflexion. The hemorrhoids       were Grade I (internal hemorrhoids that do not prolapse).      The exam was otherwise without abnormality on direct and retroflexion       views. Impression:            - The examined portion of the ileum was normal.                        - Internal hemorrhoids.                        - The examination was otherwise normal on direct and                         retroflexion views.                        - No specimens collected. Recommendation:        - Return patient to hospital ward for possible                         discharge same day.                        - Advance diet as tolerated.                        - Continue present medications.                        - Resume Plavix (clopidogrel) at prior dose tomorrow.                        - Given negative EGD/Colonoscopy, no further inpatient                         work-up warranted. He could have small bowel lesions  that can be evaluated with a VCE as an outpatient.                         Given recent vascular surgery and history of vascular                         issues, the benefits of restarting plavix tomorrow                         likely outweigh any risks. He should be started on b12                         and iron supplementation and can follow-up as an                         outpatient. Procedure Code(s):     --- Professional ---                        [phone], Colonoscopy, flexible; diagnostic, including                         collection of specimen(s) by brushing or washing, when                         performed (separate procedure) Diagnosis Code(s):     --- Professional ---                        K64.0, First degree hemorrhoids                        D50.9, Iron deficiency anemia, unspecified CPT copyright 2022 American Medical Association. All rights reserved. The codes documented in this report are preliminary and upon coder review may  be revised to  meet current compliance requirements. Eather Colas MD, MD 08/07/2023 2:07:53 PM Number of Addenda: 0 Note Initiated On: 08/07/2023 1:17 PM Scope Withdrawal Time: 0 hours 7 minutes 0 seconds  Total Procedure Duration: 0 hours 10 minutes 33 seconds  Estimated Blood Loss:  Estimated blood loss: none.      [facility]

## 2023-08-07 NOTE — Progress Notes (Signed)
Patient to Endo via bed with transport personnel in stable condition

## 2023-08-07 NOTE — Anesthesia Postprocedure Evaluation (Signed)
Anesthesia Post Note  Patient: Michael Thomas  Procedure(s) Performed: ESOPHAGOGASTRODUODENOSCOPY (EGD) WITH PROPOFOL COLONOSCOPY WITH PROPOFOL  Patient location during evaluation: PACU Anesthesia Type: General Level of consciousness: awake and alert, oriented and patient cooperative Pain management: pain level controlled Vital Signs Assessment: post-procedure vital signs reviewed and stable Respiratory status: spontaneous breathing, nonlabored ventilation and respiratory function stable Cardiovascular status: blood pressure returned to baseline and stable Postop Assessment: adequate PO intake Anesthetic complications: no   No notable events documented.   Last Vitals:  Vitals:   08/07/23 1413 08/07/23 1428  BP: (!) 85/55 111/64  Pulse: 87 81  Resp: 16 15  Temp:    SpO2: 100% 100%    Last Pain:  Vitals:   08/07/23 1428  TempSrc:   PainSc: 0-No pain                 Reed Breech

## 2023-08-07 NOTE — Anesthesia Preprocedure Evaluation (Signed)
Anesthesia Evaluation  Patient identified by MRN, date of birth, ID band Patient awake    Reviewed: Allergy & Precautions, H&P , NPO status , Patient's Chart, lab work & pertinent test results, reviewed documented beta blocker date and time   History of Anesthesia Complications Negative for: history of anesthetic complications  Airway Mallampati: III  TM Distance: >3 FB Neck ROM: full    Dental  (+) Dental Advidsory Given, Poor Dentition, Missing   Pulmonary shortness of breath (2/2 anemia), sleep apnea and Continuous Positive Airway Pressure Ventilation , neg COPD, neg recent URI   Pulmonary exam normal breath sounds clear to auscultation       Cardiovascular Exercise Tolerance: Good hypertension, (-) angina + Peripheral Vascular Disease  (-) Past MI and (-) Cardiac Stents Normal cardiovascular exam(-) dysrhythmias (-) Valvular Problems/Murmurs Rhythm:regular Rate:Normal     Neuro/Psych negative neurological ROS  negative psych ROS   GI/Hepatic negative GI ROS, Neg liver ROS,,,  Endo/Other  diabetes    Renal/GU CRFRenal disease (kidney stones)  negative genitourinary   Musculoskeletal   Abdominal   Peds  Hematology negative hematology ROS (+)   Anesthesia Other Findings Past Medical History: 03/25/2015: Benign prostatic hyperplasia with urinary obstruction No date: Chronic kidney disease No date: Diabetes mellitus without complication (HCC) No date: History of kidney stones No date: Hyperlipidemia No date: Hypertension No date: OSA (obstructive sleep apnea) No date: Vitamin D deficiency   Reproductive/Obstetrics negative OB ROS                             Anesthesia Physical Anesthesia Plan  ASA: 3  Anesthesia Plan: General   Post-op Pain Management:    Induction: Intravenous  PONV Risk Score and Plan: 2 and Propofol infusion and TIVA  Airway Management Planned: Natural  Airway and Nasal Cannula  Additional Equipment:   Intra-op Plan:   Post-operative Plan:   Informed Consent: I have reviewed the patients History and Physical, chart, labs and discussed the procedure including the risks, benefits and alternatives for the proposed anesthesia with the patient or authorized representative who has indicated his/her understanding and acceptance.     Dental Advisory Given  Plan Discussed with: Anesthesiologist, CRNA and Surgeon  Anesthesia Plan Comments:        Anesthesia Quick Evaluation

## 2023-08-07 NOTE — Transfer of Care (Signed)
Immediate Anesthesia Transfer of Care Note  Patient: Michael Thomas  Procedure(s) Performed: ESOPHAGOGASTRODUODENOSCOPY (EGD) WITH PROPOFOL COLONOSCOPY WITH PROPOFOL  Patient Location: PACU  Anesthesia Type:General  Level of Consciousness: drowsy  Airway & Oxygen Therapy: Patient Spontanous Breathing  Post-op Assessment: Report given to RN and Post -op Vital signs reviewed and stable  Post vital signs: stable  Last Vitals:  Vitals Value Taken Time  BP 102/54 08/07/23 1400  Temp 35.8 C 08/07/23 1359  Pulse 88 08/07/23 1400  Resp 16 08/07/23 1400  SpO2 98 % 08/07/23 1400  Vitals shown include unfiled device data.  Last Pain:  Vitals:   08/07/23 1359  TempSrc: Temporal  PainSc: Asleep      Patients Stated Pain Goal: 0 (08/05/23 2304)  Complications: No notable events documented.

## 2023-08-07 NOTE — Progress Notes (Signed)
PROGRESS NOTE    Michael Thomas  WUJ:[id number] DOB: 1946/02/03 DOA: 08/04/2023 PCP: Monica Becton, MD  Chief Complaint  Patient presents with   Abnormal Lab    Hospital Course:  Michael Thomas is 77 y.o. male with history of type 2 diabetes, chronic kidney disease, hypertension, dyslipidemia, OSA, BPH, who presented to the ER with acute onset of generalized fatigue and associated intermittent melena.  He was recently seen by his primary care physician had an H&H tach.  He was then prompted to come to the ER for anemia.  He denies any bright red blood per rectum.  No nausea, no vomiting.  Denies any syncopal episodes.  Reports his last colonoscopy was over 15 years ago.  On 12/13 patient underwent abdominal aortogram with balloon angioplasty.  He was subsequently initiated on dual antiplatelet therapy. - In the ED patient was found to have hemodynamically stable vitals, creatinine 1.7, and CBC with hemoglobin of 6.7.  He was transfused 1 unit PRBC.  GI was consulted.  Subjective: No acute events overnight.  Evaluated patient after EGD and colonoscopy.  No significant findings.  Patient is not having difficulty urinating.  He has previously had difficulty with postanesthesia urinary retention.  He is drinking fluids, with minimal success.    Objective: Vitals:   08/06/23 0818 08/06/23 1613 08/06/23 2025 08/07/23 0535  BP: (!) 120/59 135/68 (!) 149/64 114/60  Pulse: 66 60 65 64  Resp: 18 18 18 18   Temp: 97.8 F (36.6 C) 97.8 F (36.6 C) 98.1 F (36.7 C) 98.3 F (36.8 C)  TempSrc:      SpO2: 100% 100% 100% 96%  Weight:      Height:       No intake or output data in the 24 hours ending 08/07/23 0732  Filed Weights   08/05/23 2220  Weight: 60.5 kg    Examination: General exam: Appears calm and comfortable, NAD  Respiratory system: No work of breathing, symmetric chest wall expansion Cardiovascular system: S1 & S2 heard, RRR.  Gastrointestinal system:  Abdomen is nondistended, soft and nontender.  Neuro: Alert and oriented. No focal neurological deficits. Extremities: Symmetric, expected ROM Skin: No rashes, lesions Psychiatry: Demonstrates appropriate judgement and insight. Mood & affect appropriate for situation.   Assessment & Plan:  Principal Problem:   GI bleeding Active Problems:   ABLA (acute blood loss anemia)   Type 2 diabetes mellitus with chronic kidney disease, with long-term current use of insulin (HCC)   BPH (benign prostatic hyperplasia) with detrusor instability/OAB   Symptomatic anemia   Dyslipidemia   Essential hypertension    Postoperative urinary retention - Patient has a history of this problem - He has been on his BPH meds since arrival - Have straight cath x 1 - Bladder scan every 4 hours, repeat straight cath as needed  GI bleed, related to dual antiplatelet therapy use - GI consulted - EGD and colonoscopy mostly unremarkable.  Gastritis.  Biopsies taken.  Follow biopsies resume Plavix tomorrow - May need VCE outpatient - GI has cleared the patient for aspirin given recent endarterectomy.  Acute blood loss anemia superimposed on iron deficiency anemia and B12 deficiency. - Status post 1 unit PRBC - Repeat CBC in a.m. - Patient currently asymptomatic - Cont supplementation - Secondary to #1  Type 2 diabetes with chronic kidney disease with long-term use of insulin - Sliding scale insulin, titrate up as tolerated - Hold metformin while admitted - Avoid nephrotoxic meds - Trend CMP  in a.m.  Hypertension - Continue home meds  Dyslipidemia - Continue home meds  BPH - Flomax  Peripheral artery disease with severe claudication - Patient has recently undergone balloon angioplasty with stent placement R leg on 12/13.  Follows outpatient with vascular surgery - Is on dual antiplatelet therapy.  Hold Plavix for now as above.  Resume tomorrow. - Continue with aspirin  OSA - CPAP at  night   DVT prophylaxis: No AC given GI bleed. SCD on left leg only   Code Status: Full Code Family Communication: Patient's wife at bedside for discussion  Disposition:  Status is: Inpatient, urinary retention currently. Hopefully discharge home in the morning  Consultants:    Treatment Team:  Consulting Physician: Regis Bill, MD  Procedures:    Antimicrobials:  Anti-infectives (From admission, onward)    None       Data Reviewed: I have personally reviewed following labs and imaging studies CBC: Recent Labs  Lab 08/03/23 1348 08/04/23 1430 08/05/23 0421 08/06/23 0539 08/07/23 0229  WBC 4.9 4.4 5.0 6.2 5.4  NEUTROABS  --   --   --  3.6 3.0  HGB 6.8* 6.7* 7.0* 7.3* 8.2*  HCT 22.4* 21.2* 21.0* 22.4* 25.1*  MCV 88 86.9 85.4 84.5 84.8  PLT 639* 568* 463* 472* 505*   Basic Metabolic Panel: Recent Labs  Lab 08/03/23 1348 08/04/23 1430 08/05/23 0421 08/06/23 0539 08/07/23 0229  NA 140 136 137 137 137  K 5.2 4.6 4.2 4.2 4.3  CL 106 107 111 110 108  CO2 18* 20* 21* 19* 22  GLUCOSE 140* 110* 90 94 98  BUN 50* 51* 46* 36* 27*  CREATININE 2.09* 1.77* 1.73* 1.64* 1.43*  CALCIUM 9.9 8.9 8.6* 8.5* 8.7*  MG  --   --   --  1.8 1.8  PHOS  --   --   --  3.8 3.1   GFR: Estimated Creatinine Clearance: 31 mL/min (A) (by C-G formula based on SCr of 1.43 mg/dL (H)). Liver Function Tests: Recent Labs  Lab 08/03/23 1348 08/04/23 1430 08/06/23 0539 08/07/23 0229  AST 13 14* 14* 16  ALT 10 11 11 13   ALKPHOS 34* 28* 19* 23*  BILITOT <0.2 0.3 0.4 0.7  PROT 6.7 6.7 5.7* 6.5  ALBUMIN 4.1 3.7 3.0* 3.5   CBG: No results for input(s): "GLUCAP" in the last 168 hours.  No results found for this or any previous visit (from the past 240 hours).   Radiology Studies: No results found.  Scheduled Meds:  aspirin EC  81 mg Oral Daily   fenofibrate  160 mg Oral Daily   fesoterodine  4 mg Oral Daily   glipiZIDE  10 mg Oral Q breakfast   lisinopril  20 mg Oral Daily    And   hydrochlorothiazide  25 mg Oral Daily   omega-3 acid ethyl esters  2 capsule Oral BID   pantoprazole (PROTONIX) IV  40 mg Intravenous Q12H   pioglitazone  30 mg Oral Daily   simvastatin  40 mg Oral q1800   tamsulosin  0.4 mg Oral BID   Continuous Infusions:     LOS: 3 days    Time spent:   Debarah Crape, DO [facility]  To contact the attending physician between 7A-7P please use Epic Chat. To contact the covering physician during after hours 7P-7A, please review Amion.   08/07/2023, 7:32 AM   *This document has been created with the assistance of dictation software. Please excuse typographical  errors. *

## 2023-08-07 NOTE — Hospital Course (Addendum)
Michael Thomas is 77 y.o. male with history of type 2 diabetes, chronic kidney disease, hypertension, dyslipidemia, OSA, BPH, who presented to the ER with acute onset of generalized fatigue and associated intermittent melena.  He was recently seen by his primary care physician who ordered H and H.  He was then prompted to come to the ER for anemia.  No nausea, no vomiting.  Denies any syncopal episodes.  Reports his last colonoscopy was over 15 years ago.  On 12/13 patient underwent abdominal aortogram with balloon angioplasty.  He was subsequently initiated on dual antiplatelet therapy.   In the ED patient was found to have hemodynamically stable vitals, creatinine 1.7, and CBC with hemoglobin of 6.7.  He was transfused 1 unit PRBC.  GI was consulted.  He was transfused 1 unit PRBC and GI was consulted.  Patient underwent EGD and colonoscopy on 12/30 which revealed internal hemorrhoids but otherwise exam was normal.  We restarted his Plavix on 12/31, as the benefits outweigh the risks given his recent vascular surgery. If patient rebleeds he may need to follow-up with gastroenterology for video capsule endoscopy.  Will need to follow-up with his primary care physician to recheck hemoglobin. Stay was briefly complicated by urinary retention.  Patient does have BPH and a history of urinary retention with anesthesia.   He was given strict ER return precautions.  As patient was also found to have iron and B12 supplement.  He was given a dose of IV iron and an IM injection of B12 and started on supplement.  His primary care provider can monitor and make changes as appropriate.  Patient will continue on current medications and need to have a close follow-up with his provider for further management.  Hemoglobin of 7.7 on the day of discharge.  Patient was able to void after having a transient urinary retention after anesthesia.

## 2023-08-07 NOTE — Op Note (Signed)
[facility] Patient Name: Michael Thomas Procedure Date: 08/07/2023 1:17 PM MRN: [id number] Account #: [id number] Date of Birth: 11/22/1945 Admit Type: Inpatient Age: 77 Room: [facility] ENDO ROOM 3 Gender: Male Note Status: Finalized Instrument Name: Upper Endoscope [id number] Procedure:             Upper GI endoscopy Indications:           Iron deficiency anemia Providers:             Eather Colas MD, MD Referring MD:          No Local Md, MD (Referring MD) Medicines:             Monitored Anesthesia Care Complications:         No immediate complications. Estimated blood loss:                         Minimal. Procedure:             Pre-Anesthesia Assessment:                        - Prior to the procedure, a History and Physical was                         performed, and patient medications and allergies were                         reviewed. The patient is competent. The risks and                         benefits of the procedure and the sedation options and                         risks were discussed with the patient. All questions                         were answered and informed consent was obtained.                         Patient identification and proposed procedure were                         verified by the physician, the nurse, the                         anesthesiologist, the anesthetist and the technician                         in the endoscopy suite. Mental Status Examination:                         alert and oriented. Airway Examination: normal                         oropharyngeal airway and neck mobility. Respiratory                         Examination: clear to auscultation. CV Examination:  normal. Prophylactic Antibiotics: The patient does not                         require prophylactic antibiotics. Prior                         Anticoagulants: The patient has taken Plavix                          (clopidogrel), last dose was 4 days prior to                         procedure. ASA Grade Assessment: III - A patient with                         severe systemic disease. After reviewing the risks and                         benefits, the patient was deemed in satisfactory                         condition to undergo the procedure. The anesthesia                         plan was to use monitored anesthesia care (MAC).                         Immediately prior to administration of medications,                         the patient was re-assessed for adequacy to receive                         sedatives. The heart rate, respiratory rate, oxygen                         saturations, blood pressure, adequacy of pulmonary                         ventilation, and response to care were monitored                         throughout the procedure. The physical status of the                         patient was re-assessed after the procedure.                        After obtaining informed consent, the endoscope was                         passed under direct vision. Throughout the procedure,                         the patient's blood pressure, pulse, and oxygen                         saturations were monitored continuously. The Endoscope  was introduced through the mouth, and advanced to the                         second part of duodenum. The upper GI endoscopy was                         accomplished without difficulty. The patient tolerated                         the procedure well. Findings:      The examined esophagus was normal.      Patchy mild inflammation characterized by erythema was found in the       gastric antrum. Biopsies were taken with a cold forceps for Helicobacter       pylori testing. Estimated blood loss was minimal.      The exam of the stomach was otherwise normal.      The examined duodenum was normal. Impression:            - Normal esophagus.                         - Gastritis. Biopsied.                        - Normal examined duodenum. Recommendation:        - Await pathology results.                        - Perform a colonoscopy today. Procedure Code(s):     --- Professional ---                        [phone], Esophagogastroduodenoscopy, flexible,                         transoral; with biopsy, single or multiple Diagnosis Code(s):     --- Professional ---                        K29.70, Gastritis, unspecified, without bleeding                        D50.9, Iron deficiency anemia, unspecified CPT copyright 2022 American Medical Association. All rights reserved. The codes documented in this report are preliminary and upon coder review may  be revised to meet current compliance requirements. Eather Colas MD, MD 08/07/2023 2:01:29 PM Number of Addenda: 0 Note Initiated On: 08/07/2023 1:17 PM Estimated Blood Loss:  Estimated blood loss was minimal.      [facility]

## 2023-08-08 ENCOUNTER — Encounter: Payer: Self-pay | Admitting: Gastroenterology

## 2023-08-08 ENCOUNTER — Encounter (INDEPENDENT_AMBULATORY_CARE_PROVIDER_SITE_OTHER): Payer: PPO

## 2023-08-08 ENCOUNTER — Ambulatory Visit (HOSPITAL_COMMUNITY): Payer: PPO

## 2023-08-08 DIAGNOSIS — E1122 Type 2 diabetes mellitus with diabetic chronic kidney disease: Secondary | ICD-10-CM | POA: Diagnosis not present

## 2023-08-08 DIAGNOSIS — K922 Gastrointestinal hemorrhage, unspecified: Secondary | ICD-10-CM | POA: Diagnosis not present

## 2023-08-08 DIAGNOSIS — N400 Enlarged prostate without lower urinary tract symptoms: Secondary | ICD-10-CM

## 2023-08-08 DIAGNOSIS — D62 Acute posthemorrhagic anemia: Secondary | ICD-10-CM | POA: Diagnosis not present

## 2023-08-08 LAB — COMPREHENSIVE METABOLIC PANEL
ALT: 11 U/L (ref 0–44)
AST: 13 U/L — ABNORMAL LOW (ref 15–41)
Albumin: 3.3 g/dL — ABNORMAL LOW (ref 3.5–5.0)
Alkaline Phosphatase: 28 U/L — ABNORMAL LOW (ref 38–126)
Anion gap: 7 (ref 5–15)
BUN: 27 mg/dL — ABNORMAL HIGH (ref 8–23)
CO2: 21 mmol/L — ABNORMAL LOW (ref 22–32)
Calcium: 8.8 mg/dL — ABNORMAL LOW (ref 8.9–10.3)
Chloride: 107 mmol/L (ref 98–111)
Creatinine, Ser: 1.59 mg/dL — ABNORMAL HIGH (ref 0.61–1.24)
GFR, Estimated: 44 mL/min — ABNORMAL LOW (ref >60)
Glucose, Bld: 141 mg/dL — ABNORMAL HIGH (ref 70–99)
Potassium: 4.3 mmol/L (ref 3.5–5.1)
Sodium: 135 mmol/L (ref 135–145)
Total Bilirubin: 0.5 mg/dL (ref 0.0–1.2)
Total Protein: 6 g/dL — ABNORMAL LOW (ref 6.5–8.1)

## 2023-08-08 LAB — CBC WITH DIFFERENTIAL/PLATELET
Abs Immature Granulocytes: 0.01 10*3/uL (ref 0.00–0.07)
Basophils Absolute: 0 10*3/uL (ref 0.0–0.1)
Basophils Relative: 0 %
Eosinophils Absolute: 0.2 10*3/uL (ref 0.0–0.5)
Eosinophils Relative: 4 %
HCT: 23.9 % — ABNORMAL LOW (ref 39.0–52.0)
Hemoglobin: 7.7 g/dL — ABNORMAL LOW (ref 13.0–17.0)
Immature Granulocytes: 0 %
Lymphocytes Relative: 32 %
Lymphs Abs: 1.7 10*3/uL (ref 0.7–4.0)
MCH: 27.8 pg (ref 26.0–34.0)
MCHC: 32.2 g/dL (ref 30.0–36.0)
MCV: 86.3 fL (ref 80.0–100.0)
Monocytes Absolute: 0.4 10*3/uL (ref 0.1–1.0)
Monocytes Relative: 8 %
Neutro Abs: 3 10*3/uL (ref 1.7–7.7)
Neutrophils Relative %: 56 %
Platelets: 529 10*3/uL — ABNORMAL HIGH (ref 150–400)
RBC: 2.77 MIL/uL — ABNORMAL LOW (ref 4.22–5.81)
RDW: 14.9 % (ref 11.5–15.5)
WBC: 5.4 10*3/uL (ref 4.0–10.5)
nRBC: 0 % (ref 0.0–0.2)

## 2023-08-08 LAB — SURGICAL PATHOLOGY

## 2023-08-08 LAB — PHOSPHORUS: Phosphorus: 3.7 mg/dL (ref 2.5–4.6)

## 2023-08-08 LAB — MAGNESIUM: Magnesium: 1.9 mg/dL (ref 1.7–2.4)

## 2023-08-08 MED ORDER — CLOPIDOGREL BISULFATE 75 MG PO TABS
75.0000 mg | ORAL_TABLET | Freq: Every day | ORAL | Status: DC
  Administered 2023-08-08: 75 mg via ORAL
  Filled 2023-08-08: qty 1

## 2023-08-08 MED ORDER — CYANOCOBALAMIN 1000 MCG PO TABS: 1000.0000 ug | ORAL_TABLET | Freq: Every day | ORAL | 1 refills | Status: AC

## 2023-08-08 MED ORDER — IRON SUCROSE 200 MG IVPB - SIMPLE MED
200.0000 mg | Freq: Once | Status: AC
  Administered 2023-08-08: 200 mg via INTRAVENOUS
  Filled 2023-08-08: qty 200

## 2023-08-08 MED ORDER — VITAMIN B-12 1000 MCG PO TABS
1000.0000 ug | ORAL_TABLET | Freq: Every day | ORAL | Status: DC
  Administered 2023-08-08: 1000 ug via ORAL
  Filled 2023-08-08: qty 1

## 2023-08-08 MED ORDER — FERROUS SULFATE 325 (65 FE) MG PO TBEC: 325.0000 mg | DELAYED_RELEASE_TABLET | Freq: Two times a day (BID) | ORAL | 1 refills | Status: DC

## 2023-08-08 MED ORDER — CYANOCOBALAMIN 1000 MCG/ML IJ SOLN
1000.0000 ug | Freq: Once | INTRAMUSCULAR | Status: AC
  Administered 2023-08-08: 1000 ug via INTRAMUSCULAR
  Filled 2023-08-08: qty 1

## 2023-08-08 MED ORDER — FE FUM-VIT C-VIT B12-FA 460-60-0.01-1 MG PO CAPS
1.0000 | ORAL_CAPSULE | Freq: Two times a day (BID) | ORAL | Status: DC
  Administered 2023-08-08: 1 via ORAL
  Filled 2023-08-08: qty 1

## 2023-08-08 NOTE — Progress Notes (Signed)
 Physical Therapy Evaluation Patient Details Name: Michael Thomas MRN: [id number] DOB: [birth year] Today's Date: 08/08/2023  History of Present Illness  Michael Thomas is 77 y.o. male with history of type 2 diabetes, chronic kidney disease, hypertension, dyslipidemia, OSA, BPH, who presented to the ER with acute onset of generalized fatigue and associated intermittent melena.   Clinical Impression  Orders Received. Chart Reviewed. Patient supine in bed upon PT arrival, agreeable to PT evaluation. Prior to admission, patient was IND with mobility and ADLs. Lives in multilevel home with his spouse. Patient denies pain this date. Able to complete all bed mobility, STS transfers and ambulation at IND level. Patient tolerated >250 ft without AD and negotiation of 12 stairs without rails with only mild fatigue. No physical assist required, supervision for safety stair negotiation. Patient seems to be at functional baseline at this time, and mobility is adequate to discharge home with spouse support. No skilled PT needs at this time. PT will sign off.       If plan is discharge home, recommend the following:     Can travel by private vehicle        Equipment Recommendations None recommended by PT  Recommendations for Other Services       Functional Status Assessment Patient has not had a recent decline in their functional status     Precautions / Restrictions Precautions Precautions: None Restrictions Weight Bearing Restrictions Per Provider Order: No      Mobility  Bed Mobility Overal bed mobility: Independent             General bed mobility comments: IND with all aspects of bed mobility.    Transfers Overall transfer level: Independent Equipment used: None               General transfer comment: IND with STS transfer; no evidence of imbalance    Ambulation/Gait Ambulation/Gait assistance: Independent Gait Distance (Feet): 250 Feet Assistive device:  None Gait Pattern/deviations: WFL(Within Functional Limits) Gait velocity: WFL     General Gait Details: Patient able to ambulate x 250 ft no AD, no imbalance noted.  Stairs Stairs: Yes Stairs assistance: Supervision Stair Management: No rails Number of Stairs: 12 General stair comments: reciprocal pattern with ascending/descending flight of stairs, no imbalance noted. Mild fatigue after completion.  Wheelchair Mobility     Tilt Bed    Modified Rankin (Stroke Patients Only)       Balance Overall balance assessment: Independent                                           Pertinent Vitals/Pain Pain Assessment Pain Assessment: No/denies pain    Home Living Family/patient expects to be discharged to:: Private residence Living Arrangements: Spouse/significant other Available Help at Discharge: Family Type of Home: House Home Access: Stairs to enter   Secretary/administrator of Steps: 1 Alternate Level Stairs-Number of Steps: 12-13 Home Layout: Two level;1/2 bath on main level;Bed/bath upstairs        Prior Function Prior Level of Function : Independent/Modified Independent             Mobility Comments: IND w/o AD ADLs Comments: IND     Extremity/Trunk Assessment   Upper Extremity Assessment Upper Extremity Assessment: Overall WFL for tasks assessed    Lower Extremity Assessment Lower Extremity Assessment: Overall WFL for tasks assessed    Cervical /  Trunk Assessment Cervical / Trunk Assessment: Normal  Communication   Communication Communication: No apparent difficulties  Cognition Arousal: Alert Behavior During Therapy: WFL for tasks assessed/performed Overall Cognitive Status: Within Functional Limits for tasks assessed                                          General Comments      Exercises Other Exercises Other Exercises: Pt requesting information regarding participation in activity upon discharge. PT  educating on gradual increase in endurance activitiy (walking) and good starting place for current level of tolerance and progression from that standpoint. Pt verbalized understanding.   Assessment/Plan    PT Assessment Patient does not need any further PT services  PT Problem List         PT Treatment Interventions      PT Goals (Current goals can be found in the Care Plan section)       Frequency       Co-evaluation               AM-PAC PT 6 Clicks Mobility  Outcome Measure Help needed turning from your back to your side while in a flat bed without using bedrails?: None Help needed moving from lying on your back to sitting on the side of a flat bed without using bedrails?: None Help needed moving to and from a bed to a chair (including a wheelchair)?: None Help needed standing up from a chair using your arms (e.g., wheelchair or bedside chair)?: None Help needed to walk in hospital room?: None Help needed climbing 3-5 steps with a railing? : None 6 Click Score: 24    End of Session Equipment Utilized During Treatment: Gait belt Activity Tolerance: Patient tolerated treatment well Patient left: in bed;with call bell/phone within reach Nurse Communication: Mobility status PT Visit Diagnosis: Unsteadiness on feet (R26.81)    Time: 1206-1220 PT Time Calculation (min) (ACUTE ONLY): 14 min   Charges:   PT Evaluation $PT Eval Low Complexity: 1 Low   PT General Charges $$ ACUTE PT VISIT: 1 Visit         Carolyn CHRISTELLA Kingfisher, PT, DPT 08/08/23 12:29 PM

## 2023-08-08 NOTE — Discharge Summary (Signed)
 Physician Discharge Summary   Patient: Michael Thomas MRN: [id number] DOB: 12/28/1945  Admit date:     08/04/2023  Discharge date: 08/08/23  Discharge Physician: Amaryllis Dare   PCP: Curtis Debby PARAS, MD   Recommendations at discharge:  Please obtain CBC and BMP on follow-up Please monitor for any continuation of bleeding Please monitor iron  and B12 levels and continue supplement as appropriate. Patient is continuing aspirin  and Plavix  for now, risk and benefit discussed and explained. Please follow-up H. pylori and pathology results Follow-up with gastroenterology Follow-up with primary care provider  Discharge Diagnoses: Principal Problem:   GI bleeding Active Problems:   ABLA (acute blood loss anemia)   Type 2 diabetes mellitus with chronic kidney disease, with long-term current use of insulin  (HCC)   BPH (benign prostatic hyperplasia) with detrusor instability/OAB   Symptomatic anemia   Dyslipidemia   Essential hypertension  Resolved Problems:   * No resolved hospital problems. *  Hospital Course: Michael Thomas is 77 y.o. male with history of type 2 diabetes, chronic kidney disease, hypertension, dyslipidemia, OSA, BPH, who presented to the ER with acute onset of generalized fatigue and associated intermittent melena.  He was recently seen by his primary care physician who ordered H and H.  He was then prompted to come to the ER for anemia.  No nausea, no vomiting.  Denies any syncopal episodes.  Reports his last colonoscopy was over 15 years ago.  On 12/13 patient underwent abdominal aortogram with balloon angioplasty.  He was subsequently initiated on dual antiplatelet therapy.   In the ED patient was found to have hemodynamically stable vitals, creatinine 1.7, and CBC with hemoglobin of 6.7.  He was transfused 1 unit PRBC.  GI was consulted.  He was transfused 1 unit PRBC and GI was consulted.  Patient underwent EGD and colonoscopy on 12/30 which revealed  internal hemorrhoids but otherwise exam was normal.  We restarted his Plavix  on 12/31, as the benefits outweigh the risks given his recent vascular surgery. If patient rebleeds he may need to follow-up with gastroenterology for video capsule endoscopy.  Will need to follow-up with his primary care physician to recheck hemoglobin. Stay was briefly complicated by urinary retention.  Patient does have BPH and a history of urinary retention with anesthesia.   He was given strict ER return precautions.  As patient was also found to have iron  and B12 supplement.  He was given a dose of IV iron  and an IM injection of B12 and started on supplement.  His primary care provider can monitor and make changes as appropriate.  Patient will continue on current medications and need to have a close follow-up with his provider for further management.  Hemoglobin of 7.7 on the day of discharge.  Patient was able to void after having a transient urinary retention after anesthesia.  Consultants: Gastroenterology Procedures performed: EGD and colonoscopy Disposition: Home Diet recommendation:  Discharge Diet Orders (From admission, onward)     Start     Ordered   08/08/23 0000  Diet - low sodium heart healthy        08/08/23 1153           Cardiac diet DISCHARGE MEDICATION: Allergies as of 08/08/2023       Reactions   Amoxicillin  Other (See Comments)   Fatigue and Myalgia and Rigors x2 attempts        Medication List     TAKE these medications    AMBULATORY NON FORMULARY MEDICATION  One Touch Ultra 2 glucometer   aspirin  EC 81 MG tablet Take 81 mg by mouth at bedtime.   clobetasol  ointment 0.05 % Commonly known as: TEMOVATE  APPLY TOPICALLY TWO TIMES A DAY AS NEEDED   clopidogrel  75 MG tablet Commonly known as: Plavix  Take 1 tablet (75 mg total) by mouth daily. What changed: Another medication with the same name was removed. Continue taking this medication, and follow the directions you  see here.   cyanocobalamin  1000 MCG tablet Take 1 tablet (1,000 mcg total) by mouth daily. Start taking on: August 09, 2023   fenofibrate  160 MG tablet Take 1 tablet (160 mg total) by mouth daily.   ferrous sulfate  325 (65 FE) MG EC tablet Take 1 tablet (325 mg total) by mouth 2 (two) times daily.   glipiZIDE  10 MG 24 hr tablet Commonly known as: GLUCOTROL  XL TAKE 1 TABLET BY MOUTH EVERY DAY WITH BREAKFAST   lisinopril -hydrochlorothiazide  20-25 MG tablet Commonly known as: ZESTORETIC  TAKE 1 TABLET DAILY.   metFORMIN  1000 MG tablet Commonly known as: GLUCOPHAGE  TAKE 1 TABLET (1,000 MG TOTAL) BY MOUTH TWICE A DAY WITH FOOD   omega-3 acid ethyl esters 1 g capsule Commonly known as: LOVAZA  TAKE 2 CAPSULES BY MOUTH TWICE A DAY   OneTouch Delica Plus Lancet33G Misc USE UP TO 3 TIMES DAILY.   OneTouch Verio Reflect w/Device Kit Check fasting blood sugar every morning and 2 hours after largest meal of the day.,   OneTouch Verio test strip Generic drug: glucose blood CHECK FASTING BLOOD SUGAR EVERY MORNING AND 2 HOURS AFTER LARGEST MEAL OF THE DAY.,   pioglitazone  30 MG tablet Commonly known as: ACTOS  Take 30 mg by mouth at bedtime.   polyethylene glycol 17 g packet Commonly known as: MiraLax  Take 17 g by mouth daily.   simvastatin  40 MG tablet Commonly known as: ZOCOR  TAKE 1 TABLET BY MOUTH EVERYDAY AT BEDTIME   solifenacin 5 MG tablet Commonly known as: VESICARE Take 1 tablet (5 mg total) by mouth daily.   tamsulosin  0.4 MG Caps capsule Commonly known as: FLOMAX  Take 1 capsule (0.4 mg total) by mouth 2 (two) times daily.        Follow-up Information     Maryruth Ole DASEN, MD. Go in 1 week(s).   Specialty: Gastroenterology Why: office will called pt. to schedule follow/up appt. Contact information: [address] [place] [address] [phone]         Curtis Debby PARAS, MD. Schedule an appointment as soon as possible for a visit in 1  week(s).   Specialties: Family Medicine, Sports Medicine, Radiology Contact information: [phone] [place] [address] [phone]                Discharge Exam: Fredricka Weights   08/05/23 2220  Weight: 60.5 kg   General.  Well-developed elderly man, in no acute distress. Pulmonary.  Lungs clear bilaterally, normal respiratory effort. CV.  Regular rate and rhythm, no JVD, rub or murmur. Abdomen.  Soft, nontender, nondistended, BS positive. CNS.  Alert and oriented .  No focal neurologic deficit. Extremities.  No edema, no cyanosis, pulses intact and symmetrical. Psychiatry.  Judgment and insight appears normal.   Condition at discharge: stable  The results of significant diagnostics from this hospitalization (including imaging, microbiology, ancillary and laboratory) are listed below for reference.   Imaging Studies: PERIPHERAL VASCULAR CATHETERIZATION Result Date: 07/21/2023 Table formatting from the original result was not included. DATE OF SERVICE:  07/21/2023  PATIENT:  Deirdre Manis Lard  77 y.o. male  PRE-OPERATIVE DIAGNOSIS:  Atherosclerosis of native arteries of right lower extremity causing rest pain  POST-OPERATIVE DIAGNOSIS:  Same  PROCEDURE:  1) Ultrasound guided left common femoral artery access 2) Right lower extremity angiogram with second order cannulation 3) Right femoropopliteal angioplasty and stenting (6x174mm Eluvia, 6x186mm Eluvia, 6x25mm Eluvia) 4) Conscious sedation (35 minutes)  SURGEON:  Debby SAILOR. Magda, MD  ASSISTANT: none  ANESTHESIA:   local and IV sedation  ESTIMATED BLOOD LOSS: minimal  LOCAL MEDICATIONS USED:  LIDOCAINE   COUNTS: confirmed correct.  PATIENT DISPOSITION:  PACU - hemodynamically stable.  Delay start of Pharmacological VTE agent (>24hrs) due to surgical blood loss or risk of bleeding: no  INDICATION FOR PROCEDURE: Noel Rodier is a 77 y.o. male who recently underwent right common femoral endarterectomy for  claudication. Previously placed SFA stenting was noted to be occluded on day of surgery after endarterectomy. The patient's claudication has continued and progressed to rest pain. After careful discussion of risks, benefits, and alternatives the patient was offered angiogram. he patient understood and wished to proceed.  OPERATIVE FINDINGS:  Left renal artery: not studied Right renal artery: not studied Infrarenal aorta: not studied Left common iliac artery: not studied Right common iliac artery: not studied Left internal iliac artery: not studied Right internal iliac artery: not studied Left external iliac artery: not studied Right external iliac artery: widely patent Left common femoral artery: not studied Right common femoral artery: patent endarterectomy Left profunda femoris artery: not studied Right profunda femoris artery: widely patent Left superficial femoral artery: not studied Right superficial femoral artery: small stump patent in origin of artery. Artery occluded throughout remainder. Left popliteal artery: not studied Right popliteal artery: reconstitutes above the knee via profunda collaterals Left anterior tibial artery: not studied Right anterior tibial artery: patent Left tibioperoneal trunk: not studied Right tibioperoneal trunk: patent Left peroneal artery: not studied Right peroneal artery: patent Left posterior tibial artery: not studied Right posterior tibial artery: patent Left pedal circulation: not studied Right pedal circulation: disadvantaged  GLASS score. FP 4. IP 0. Stage III.  WIfI score. Not applicable. No wound.  DESCRIPTION OF PROCEDURE: After identification of the patient in the pre-operative holding area, the patient was transferred to the operating room. The patient was positioned supine on the operating room table. Anesthesia was induced. The groins was prepped and draped in standard fashion. A surgical pause was performed confirming correct patient, procedure, and operative  location.  The left groin was anesthetized with subcutaneous injection of 1% lidocaine . Using ultrasound guidance, the left common femoral artery was accessed with micropuncture technique. Fluoroscopy was used to confirm cannulation over the femoral head. The 14F micropuncture sheath was upsized to 78F.  The right common iliac artery was selected with an omniflush catheter and glidewire advantage guidewire. The wire was advanced into the common femoral artery. Over the wire the omni flush catheter was advanced into the external iliac artery. Selective angiography was performed - see above for details.  The decision was made to intervene. The patient was heparinized with 6000 units of heparin . The 78F sheath was exchanged for a 22F x 45 sheath. Selective angiography of the left lower extremity was performed prior to intervention.  The lesions were treated with: Right femoropopliteal angioplasty and stenting (6x127mm Eluvia, 6x17mm Eluvia, 6x63mm Eluvia)  Completion angiography revealed: Resolution of R SFA / popliteal occlusion  The sheath was left in place to be removed in the  recovery area.  Conscious sedation was administered with the use of IV fentanyl  and midazolam  under continuous physician and nurse monitoring.  Heart rate, blood pressure, and oxygen saturation were continuously monitored.  Total sedation time was 35 minutes  Upon completion of the case instrument and sharps counts were confirmed correct. The patient was transferred to the PACU in good condition. I was present for all portions of the procedure.  PLAN: ASA / Plavix  / Statin. Follow up with me in 4 weeks with ABI and RLE duplex.  Debby SAILOR. Magda, MD [facility] Phone Number: [phone] 07/21/2023 11:21 AM    Microbiology: Results for orders placed or performed during the hospital encounter of 06/08/23  Surgical pcr screen     Status: None   Collection Time: 06/08/23  3:03 PM   Specimen: Nasal  Mucosa; Nasal Swab  Result Value Ref Range Status   MRSA, PCR NEGATIVE NEGATIVE Final   Staphylococcus aureus NEGATIVE NEGATIVE Final    Comment: (NOTE) The Xpert SA Assay (FDA approved for NASAL specimens in patients 21 years of age and older), is one component of a comprehensive surveillance program. It is not intended to diagnose infection nor to guide or monitor treatment. Performed at [facility], [address], [place], [place] [address]     Labs: CBC: Recent Labs  Lab 08/04/23 1430 08/05/23 0421 08/06/23 0539 08/07/23 0229 08/08/23 0424  WBC 4.4 5.0 6.2 5.4 5.4  NEUTROABS  --   --  3.6 3.0 3.0  HGB 6.7* 7.0* 7.3* 8.2* 7.7*  HCT 21.2* 21.0* 22.4* 25.1* 23.9*  MCV 86.9 85.4 84.5 84.8 86.3  PLT 568* 463* 472* 505* 529*   Basic Metabolic Panel: Recent Labs  Lab 08/04/23 1430 08/05/23 0421 08/06/23 0539 08/07/23 0229 08/08/23 0424  NA 136 137 137 137 135  K 4.6 4.2 4.2 4.3 4.3  CL 107 111 110 108 107  CO2 20* 21* 19* 22 21*  GLUCOSE 110* 90 94 98 141*  BUN 51* 46* 36* 27* 27*  CREATININE 1.77* 1.73* 1.64* 1.43* 1.59*  CALCIUM  8.9 8.6* 8.5* 8.7* 8.8*  MG  --   --  1.8 1.8 1.9  PHOS  --   --  3.8 3.1 3.7   Liver Function Tests: Recent Labs  Lab 08/03/23 1348 08/04/23 1430 08/06/23 0539 08/07/23 0229 08/08/23 0424  AST 13 14* 14* 16 13*  ALT 10 11 11 13 11   ALKPHOS 34* 28* 19* 23* 28*  BILITOT <0.2 0.3 0.4 0.7 0.5  PROT 6.7 6.7 5.7* 6.5 6.0*  ALBUMIN  4.1 3.7 3.0* 3.5 3.3*   CBG: Recent Labs  Lab 08/07/23 0820 08/07/23 1142 08/07/23 1241  GLUCAP 97 98 109*    Discharge time spent: greater than 30 minutes.  This record has been created using Conservation officer, historic buildings. Errors have been sought and corrected,but may not always be located. Such creation errors do not reflect on the standard of care.   Signed: Amaryllis Dare, MD [facility] 08/08/2023

## 2023-08-10 ENCOUNTER — Telehealth: Payer: Self-pay

## 2023-08-10 DIAGNOSIS — E1122 Type 2 diabetes mellitus with diabetic chronic kidney disease: Secondary | ICD-10-CM | POA: Diagnosis not present

## 2023-08-10 DIAGNOSIS — N281 Cyst of kidney, acquired: Secondary | ICD-10-CM | POA: Diagnosis not present

## 2023-08-10 DIAGNOSIS — K760 Fatty (change of) liver, not elsewhere classified: Secondary | ICD-10-CM | POA: Diagnosis not present

## 2023-08-10 DIAGNOSIS — R809 Proteinuria, unspecified: Secondary | ICD-10-CM | POA: Diagnosis not present

## 2023-08-10 DIAGNOSIS — N1832 Chronic kidney disease, stage 3b: Secondary | ICD-10-CM | POA: Diagnosis not present

## 2023-08-10 DIAGNOSIS — N400 Enlarged prostate without lower urinary tract symptoms: Secondary | ICD-10-CM | POA: Diagnosis not present

## 2023-08-10 DIAGNOSIS — E785 Hyperlipidemia, unspecified: Secondary | ICD-10-CM | POA: Diagnosis not present

## 2023-08-10 DIAGNOSIS — N16 Renal tubulo-interstitial disorders in diseases classified elsewhere: Secondary | ICD-10-CM | POA: Diagnosis not present

## 2023-08-10 DIAGNOSIS — D631 Anemia in chronic kidney disease: Secondary | ICD-10-CM | POA: Diagnosis not present

## 2023-08-10 DIAGNOSIS — I10 Essential (primary) hypertension: Secondary | ICD-10-CM | POA: Diagnosis not present

## 2023-08-10 NOTE — Transitions of Care (Post Inpatient/ED Visit) (Signed)
   08/10/2023  Name: Michael Thomas MRN: [id number] DOB: 07-09-1946  Today's TOC FU Call Status: Today's TOC FU Call Status:: Unsuccessful Call (1st Attempt) Unsuccessful Call (1st Attempt) Date: 08/10/23  Attempted to reach the patient regarding the most recent Inpatient/ED visit.  Follow Up Plan: Additional outreach attempts will be made to reach the patient to complete the Transitions of Care (Post Inpatient/ED visit) call.   Barnie Gowda RN, BSN, CCM RN Care Manager  Transitions of Care  [facility]  [phone]

## 2023-08-11 ENCOUNTER — Telehealth: Payer: Self-pay

## 2023-08-11 NOTE — Transitions of Care (Post Inpatient/ED Visit) (Signed)
   08/11/2023  Name: Elieser Tetrick MRN: [id number] DOB: 1946/06/11  Today's TOC FU Call Status: Today's TOC FU Call Status:: Unsuccessful Call (2nd Attempt) Unsuccessful Call (2nd Attempt) Date: 08/11/23  Attempted to reach the patient regarding the most recent Inpatient/ED visit.  Follow Up Plan: Additional outreach attempts will be made to reach the patient to complete the Transitions of Care (Post Inpatient/ED visit) call.   Barnie Gowda RN, BSN, CCM RN Care Manager  Transitions of Care  [facility] - [facility]  [phone]

## 2023-08-14 ENCOUNTER — Ambulatory Visit (INDEPENDENT_AMBULATORY_CARE_PROVIDER_SITE_OTHER): Payer: PPO

## 2023-08-14 ENCOUNTER — Telehealth: Payer: Self-pay

## 2023-08-14 ENCOUNTER — Ambulatory Visit (HOSPITAL_COMMUNITY)
Admission: RE | Admit: 2023-08-14 | Discharge: 2023-08-14 | Disposition: A | Discharge status: Home / Self Care | Payer: PPO | Source: Ambulatory Visit | Attending: Vascular Surgery | Admitting: Vascular Surgery

## 2023-08-14 ENCOUNTER — Ambulatory Visit (INDEPENDENT_AMBULATORY_CARE_PROVIDER_SITE_OTHER)
Admission: RE | Admit: 2023-08-14 | Discharge: 2023-08-14 | Disposition: A | Discharge status: Home / Self Care | Payer: PPO | Source: Ambulatory Visit | Attending: Vascular Surgery | Admitting: Vascular Surgery

## 2023-08-14 VITALS — BP 125/77 | HR 69 | Temp 98.2°F | Resp 20 | Ht 61.0 in | Wt 130.7 lb

## 2023-08-14 DIAGNOSIS — I739 Peripheral vascular disease, unspecified: Secondary | ICD-10-CM

## 2023-08-14 LAB — VAS US ABI WITH/WO TBI
Left ABI: 1
Right ABI: 1.05

## 2023-08-14 NOTE — Transitions of Care (Post Inpatient/ED Visit) (Signed)
   08/14/2023  Name: Okie Bogacz MRN: [id number] DOB: [birth year]  Today's TOC FU Call Status: Today's TOC FU Call Status:: Unsuccessful Call (3rd Attempt) Unsuccessful Call (3rd Attempt) Date: 08/14/23  Attempted to reach the patient regarding the most recent Inpatient/ED visit.  Follow Up Plan: No further outreach attempts will be made at this time. We have been unable to contact the patient.  Barnie Gowda RN, BSN, CCM RN Care Manager  Transitions of Care  [facility] - [facility]  [phone]

## 2023-08-14 NOTE — Progress Notes (Signed)
 HISTORY AND PHYSICAL     CC:  follow up. Requesting Provider:  Curtis Debby PARAS,*  HPI: This is a 78 y.o. male who is here today for follow up for PAD.  Pt has hx of right femoral endarterectomy with bovine patch angioplasty on 06/15/2023 and angiogram with right fermoro-popliteal angioplasty and stenting on 07/21/2023 both by Dr. Magda.  Pt has hx of multiple endovascular interventions for lifestyle limiting claudication by Dr. Alona.  He reports good symptom relief in the short-term after these interventions, but gradual return to baseline level of discomfort afterwards.   The pt returns today for follow up and here with his wife.  They tell me he was recently hospitalized for 4 days GIB at [facility] and was discharged on 08/08/2023.  He had upper and lower endoscopy and there were no significant findings.  His plavix  was stopped during his workup but restarted on 08/08/2023.  He has been taking his asa and plavix  and statin.  His hgb on discharge was 7.7 and he has f/u with his PCP on 08/17/2023.    He states that his right leg is much improved.  Prior to intervention, he was having numbness in his foot and this has resolved.  He was starting to walk around more before being hospitalized.   He states that his blood pressure at home has been around 115 systolic.  It was 130's this morning.  He does not have any dizziness.  The pt is on a statin for cholesterol management.    The pt is on an aspirin .    Other AC:  Plavix  The pt is on ACEI, diuretic for hypertension.  The pt is  on medication for diabetes. Tobacco hx:  never   Past Medical History:  Diagnosis Date   Benign prostatic hyperplasia with urinary obstruction 03/25/2015   Chronic kidney disease    Diabetes mellitus without complication (HCC)    History of kidney stones    Hyperlipidemia    Hypertension    OSA (obstructive sleep apnea)    Vitamin D deficiency     Past Surgical History:  Procedure Laterality Date    ABDOMINAL AORTOGRAM W/LOWER EXTREMITY N/A 05/26/2023   Procedure: ABDOMINAL AORTOGRAM W/LOWER EXTREMITY;  Surgeon: Magda Debby SAILOR, MD;  [facility]: [facility];  Service: Cardiovascular;  Laterality: N/A;   ABDOMINAL AORTOGRAM W/LOWER EXTREMITY N/A 07/21/2023   Procedure: ABDOMINAL AORTOGRAM W/LOWER EXTREMITY;  Surgeon: Magda Debby SAILOR, MD;  Location: [facility];  Service: Cardiovascular;  Laterality: N/A;   BIOPSY  08/07/2023   Procedure: BIOPSY;  Surgeon: Maryruth Ole DASEN, MD;  Location: [facility] ENDOSCOPY;  Service: Endoscopy;;   COLONOSCOPY     COLONOSCOPY WITH PROPOFOL  N/A 08/07/2023   Procedure: COLONOSCOPY WITH PROPOFOL ;  Surgeon: Maryruth Ole DASEN, MD;  Location: [facility] ENDOSCOPY;  Service: Endoscopy;  Laterality: N/A;   DG ARTHRO THUMB*L*     ENDARTERECTOMY FEMORAL Right 06/15/2023   Procedure: RIGHT COMMON FEMORAL ENDARTERECTOMY;  Surgeon: Magda Debby SAILOR, MD;  Location: [facility] OR;  Service: Vascular;  Laterality: Right;   ESOPHAGOGASTRODUODENOSCOPY (EGD) WITH PROPOFOL  N/A 08/07/2023   Procedure: ESOPHAGOGASTRODUODENOSCOPY (EGD) WITH PROPOFOL ;  Surgeon: Maryruth Ole DASEN, MD;  Location: [facility] ENDOSCOPY;  Service: Endoscopy;  Laterality: N/A;   INTRAOPERATIVE ARTERIOGRAM Right 06/15/2023   Procedure: INTRA OPERATIVE RIGHT LOWER EXTREMITY  ARTERIOGRAM;  Surgeon: Magda Debby SAILOR, MD;  Location: [facility] OR;  Service: Vascular;  Laterality: Right;   IR ANGIOGRAM EXTREMITY BILATERAL  02/20/2018   IR ANGIOGRAM EXTREMITY  BILATERAL  04/05/2019   IR ANGIOGRAM EXTREMITY LEFT  05/07/2020   IR ANGIOGRAM EXTREMITY LEFT  10/13/2022   IR ANGIOGRAM EXTREMITY RIGHT  02/15/2021   IR ANGIOGRAM EXTREMITY RIGHT  12/14/2021   IR ANGIOGRAM SELECTIVE EACH ADDITIONAL VESSEL  02/20/2018   IR ANGIOGRAM SELECTIVE EACH ADDITIONAL VESSEL  10/13/2022   IR FEM POP ART ATHERECT INC PTA MOD SED  02/20/2018   IR FEM POP ART ATHERECT INC PTA MOD SED  05/14/2019   IR FEM POP ART ATHERECT INC PTA MOD SED   05/07/2020   IR FEM POP ART ATHERECT INC PTA MOD SED  02/15/2021   IR FEM POP ART STENT INC PTA MOD SED  12/14/2021   IR ILIAC ART STENT INC PTA MOD SED  10/13/2022   IR INTRAVASCULAR ULTRASOUND NON CORONARY  02/15/2021   IR INTRAVASCULAR ULTRASOUND NON CORONARY  12/14/2021   IR INTRAVASCULAR ULTRASOUND NON CORONARY  10/13/2022   IR RADIOLOGIST EVAL & MGMT  02/01/2018   IR RADIOLOGIST EVAL & MGMT  03/20/2018   IR RADIOLOGIST EVAL & MGMT  07/25/2018   IR RADIOLOGIST EVAL & MGMT  03/07/2019   IR RADIOLOGIST EVAL & MGMT  04/23/2019   IR RADIOLOGIST EVAL & MGMT  06/18/2019   IR RADIOLOGIST EVAL & MGMT  04/29/2020   IR RADIOLOGIST EVAL & MGMT  06/04/2020   IR RADIOLOGIST EVAL & MGMT  01/20/2021   IR RADIOLOGIST EVAL & MGMT  04/27/2021   IR RADIOLOGIST EVAL & MGMT  08/12/2021   IR RADIOLOGIST EVAL & MGMT  11/02/2021   IR RADIOLOGIST EVAL & MGMT  11/15/2021   IR RADIOLOGIST EVAL & MGMT  01/26/2022   IR RADIOLOGIST EVAL & MGMT  03/23/2022   IR RADIOLOGIST EVAL & MGMT  09/23/2022   IR RADIOLOGIST EVAL & MGMT  11/25/2022   IR RADIOLOGIST EVAL & MGMT  03/22/2023   IR US  GUIDE VASC ACCESS LEFT  04/05/2019   IR US  GUIDE VASC ACCESS LEFT  02/15/2021   IR US  GUIDE VASC ACCESS LEFT  12/14/2021   IR US  GUIDE VASC ACCESS RIGHT  02/20/2018   IR US  GUIDE VASC ACCESS RIGHT  05/14/2019   IR US  GUIDE VASC ACCESS RIGHT  05/07/2020   IR US  GUIDE VASC ACCESS RIGHT  10/13/2022   NECK SURGERY     PATCH ANGIOPLASTY Right 06/15/2023   Procedure: PATCH ANGIOPLASTY USING [name] 1X6CM;  Surgeon: Magda Debby SAILOR, MD;  Location: [facility];  Service: Vascular;  Laterality: Right;   PERIPHERAL VASCULAR BALLOON ANGIOPLASTY  07/21/2023   Procedure: PERIPHERAL VASCULAR BALLOON ANGIOPLASTY;  Surgeon: Magda Debby SAILOR, MD;  [facility]: [facility];  Service: Cardiovascular;;   PERIPHERAL VASCULAR INTERVENTION  07/21/2023   Procedure: PERIPHERAL VASCULAR INTERVENTION;  Surgeon: Magda Debby SAILOR, MD;  Location: [facility];  Service: Cardiovascular;;   VASECTOMY      Allergies  Allergen Reactions   Amoxicillin  Other (See Comments)    Fatigue and Myalgia and Rigors x2 attempts    Current Outpatient Medications  Medication Sig Dispense Refill   AMBULATORY NON FORMULARY MEDICATION One Touch Ultra 2 glucometer 1 each 0   aspirin  EC 81 MG tablet Take 81 mg by mouth at bedtime.     Blood Glucose Monitoring Suppl (ONETOUCH VERIO REFLECT) w/Device KIT Check fasting blood sugar every morning and 2 hours after largest meal of the day., 1 kit 0   clobetasol  ointment (TEMOVATE ) 0.05 % APPLY TOPICALLY TWO TIMES A DAY AS NEEDED  45 g 2   clopidogrel  (PLAVIX ) 75 MG tablet Take 1 tablet (75 mg total) by mouth daily. 30 tablet 11   cyanocobalamin  1000 MCG tablet Take 1 tablet (1,000 mcg total) by mouth daily. 90 tablet 1   fenofibrate  160 MG tablet Take 1 tablet (160 mg total) by mouth daily. 90 tablet 3   ferrous sulfate  325 (65 FE) MG EC tablet Take 1 tablet (325 mg total) by mouth 2 (two) times daily. 180 tablet 1   glipiZIDE  (GLUCOTROL  XL) 10 MG 24 hr tablet TAKE 1 TABLET BY MOUTH EVERY DAY WITH BREAKFAST 90 tablet 3   Lancets (ONETOUCH DELICA PLUS LANCET33G) MISC USE UP TO 3 TIMES DAILY. 100 each 11   lisinopril -hydrochlorothiazide  (ZESTORETIC ) 20-25 MG tablet TAKE 1 TABLET DAILY. 90 tablet 3   metFORMIN  (GLUCOPHAGE ) 1000 MG tablet TAKE 1 TABLET (1,000 MG TOTAL) BY MOUTH TWICE A DAY WITH FOOD 180 tablet 1   omega-3 acid ethyl esters (LOVAZA ) 1 g capsule TAKE 2 CAPSULES BY MOUTH TWICE A DAY 360 capsule 3   ONETOUCH VERIO test strip CHECK FASTING BLOOD SUGAR EVERY MORNING AND 2 HOURS AFTER LARGEST MEAL OF THE DAY., 100 strip 12   pioglitazone  (ACTOS ) 30 MG tablet Take 30 mg by mouth at bedtime.     polyethylene glycol (MIRALAX ) 17 g packet Take 17 g by mouth daily. 90 packet 0   simvastatin  (ZOCOR ) 40 MG tablet TAKE 1 TABLET BY MOUTH EVERYDAY AT BEDTIME 90 tablet 0   solifenacin (VESICARE) 5 MG tablet  Take 1 tablet (5 mg total) by mouth daily.     tamsulosin  (FLOMAX ) 0.4 MG CAPS capsule Take 1 capsule (0.4 mg total) by mouth 2 (two) times daily. 180 capsule 3   No current facility-administered medications for this visit.    Family History  Problem Relation Age of Onset   Stroke Mother    Diabetes Mellitus II Mother    Prostate cancer Neg Hx    Kidney cancer Neg Hx    Bladder Cancer Neg Hx      PHYSICAL EXAMINATION:  Today's Vitals   08/14/23 1039  BP: 125/77  Pulse: 69  Resp: 20  Temp: 98.2 F (36.8 C)  TempSrc: Temporal  SpO2: 100%  Weight: 130 lb 11.2 oz (59.3 kg)  Height: 5' 1 (1.549 m)   Body mass index is 24.7 kg/m.   General:  WDWN in NAD; vital signs documented above Gait: Normal HENT: WNL, normocephalic Pulmonary: normal non-labored breathing Cardiac: regular HR Skin: right groin incision has healed nicely and left groin is soft Vascular Exam/Pulses: Palpable pedal pulses bilaterally Femoral pulses are palpable bilaterally   Non-Invasive Vascular Imaging:   ABI's/TBI's on 08/14/2023: Right:  1.05/0.72 - Great toe pressure: 98 Left:  1.0/0.79 - Great toe pressure: 108  Arterial duplex on 08/14/2023: +-----------+--------+-----+--------+---------+--------+  RIGHT     PSV cm/sRatioStenosisWaveform Comments  +-----------+--------+-----+--------+---------+--------+  CFA Mid    160                  triphasic          +-----------+--------+-----+--------+---------+--------+  DFA       148                  triphasic          +-----------+--------+-----+--------+---------+--------+  POP Distal 89                   triphasic          +-----------+--------+-----+--------+---------+--------+  ATA Distal 65                   biphasic           +-----------+--------+-----+--------+---------+--------+  PTA Distal 80                   biphasic           +-----------+--------+-----+--------+---------+--------+  PERO  Distal60                   biphasic           +-----------+--------+-----+--------+---------+--------+   Right Stent(s):  +---------------+---++---------++  Prox to Stent  132biphasic   +---------------+---++---------++  Proximal Stent 62 biphasic   +---------------+---++---------++  Mid Stent      132biphasic   +---------------+---++---------++  Distal Stent   134biphasic   +---------------+---++---------++  Distal to Stent113triphasic  +---------------+---++---------++   Right: Patent stent without evidence of stenosis.    ASSESSMENT/PLAN:: 78 y.o. male here for follow up for PAD with hx of right femoral endarterectomy with bovine patch angioplasty on 06/15/2023 and angiogram with right fermoro-popliteal angioplasty and stenting on 07/21/2023 both by Dr. Magda.   -pt doing well from vascular standpoint with palpable pedal pulses and patent stent without stenosis.  His toe pressures have improved.  -he was recently hospitalized for GIB.  Discussed with Dr. Magda and he would like for pt to continue dual antiplatelet therapy with asa and plavix  for at least another month, but indefinitely if he can tolerate this for stent patency.   -continue statin -pt will f/u in 3 months with Dr. Magda with RLE arterial duplex and ABI.  If there are any further issues with GIB, they will call our office to get further recommendations from Dr. Magda about his dual antiplatelet therapy.    Lucie Apt, [facility] [phone]  Clinic MD:   [place]

## 2023-08-17 ENCOUNTER — Ambulatory Visit (INDEPENDENT_AMBULATORY_CARE_PROVIDER_SITE_OTHER): Payer: PPO | Admitting: Sports Medicine

## 2023-08-17 ENCOUNTER — Other Ambulatory Visit: Payer: Self-pay | Admitting: Sports Medicine

## 2023-08-17 VITALS — BP 112/58 | HR 72 | Ht 61.0 in | Wt 134.0 lb

## 2023-08-17 DIAGNOSIS — D62 Acute posthemorrhagic anemia: Secondary | ICD-10-CM | POA: Diagnosis not present

## 2023-08-17 DIAGNOSIS — I739 Peripheral vascular disease, unspecified: Secondary | ICD-10-CM | POA: Diagnosis not present

## 2023-08-17 MED ORDER — PIOGLITAZONE HCL 30 MG PO TABS: 30.0000 mg | ORAL_TABLET | Freq: Every day | ORAL | 3 refills | Status: DC

## 2023-08-17 NOTE — Assessment & Plan Note (Signed)
>>  ASSESSMENT AND PLAN FOR ABLA (ACUTE BLOOD LOSS ANEMIA) WRITTEN ON 08/18/2023  5:05 PM BY THEKKEKANDAM, DEBBY PARAS, MD  Pleasant 78 year old male, peripheral arterial disease on anticoagulants, at the last visit we saw him, he was very tired, he did a positive Hemoccult, hemoglobin was in the sixes so we admitted him, he had a blood transfusion, he was taken off his anticoagulants, endoscopy performed and no obvious bleeding seen, placed back on anticoagulants. He feels better after discharge, ultimately anemia workup revealed iron  and B12 deficiency. He will continue iron  supplementation twice daily and B12 1000 mcg daily, rechecking CBC, anemia panel. He does have some crampy sensations in the left leg at night, likely related to restless leg syndrome which can be associated with anemia, we will watch this for now.  Update:   B12 and folate have normalized, anemia is still present, slightly worse than at discharge, iron  saturation is still quite low so needs to increase iron  sulfate to 3 times daily, continue vitamin B12 supplementation.

## 2023-08-17 NOTE — Progress Notes (Addendum)
    Procedures performed today:    None.  Independent interpretation of notes and tests performed by another provider:   None.  Brief History, Exam, Impression, and Recommendations:    ABLA (acute blood loss anemia) Pleasant 78 year old male, peripheral arterial disease on anticoagulants, at the last visit we saw him, he was very tired, he did a positive Hemoccult, hemoglobin was in the sixes so we admitted him, he had a blood transfusion, he was taken off his anticoagulants, endoscopy performed and no obvious bleeding seen, placed back on anticoagulants. He feels better after discharge, ultimately anemia workup revealed iron  and B12 deficiency. He will continue iron  supplementation twice daily and B12 1000 mcg daily, rechecking CBC, anemia panel. He does have some crampy sensations in the left leg at night, likely related to restless leg syndrome which can be associated with anemia, we will watch this for now.  Update:   B12 and folate have normalized, anemia is still present, slightly worse than at discharge, iron  saturation is still quite low so needs to increase iron  sulfate to 3 times daily, continue vitamin B12 supplementation.   Left calf claudication, SFA arterial disease Doing much better status post femoral endarterectomy and stenting. Further management per vascular surgery. As the anemia resolves I think his symptoms will improve considerably as well.    ____________________________________________ Debby PARAS. Curtis, M.D., ABFM., CAQSM., AME. Primary Care and Sports Medicine [facility]  Adjunct Professor of [facility] Medicine  [employer]  [employer], [employer]

## 2023-08-17 NOTE — Assessment & Plan Note (Signed)
 Doing much better status post femoral endarterectomy and stenting. Further management per vascular surgery. As the anemia resolves I think his symptoms will improve considerably as well.

## 2023-08-17 NOTE — Assessment & Plan Note (Addendum)
 Pleasant 78 year old male, peripheral arterial disease on anticoagulants, at the last visit we saw him, he was very tired, he did a positive Hemoccult, hemoglobin was in the sixes so we admitted him, he had a blood transfusion, he was taken off his anticoagulants, endoscopy performed and no obvious bleeding seen, placed back on anticoagulants. He feels better after discharge, ultimately anemia workup revealed iron  and B12 deficiency. He will continue iron  supplementation twice daily and B12 1000 mcg daily, rechecking CBC, anemia panel. He does have some crampy sensations in the left leg at night, likely related to restless leg syndrome which can be associated with anemia, we will watch this for now.  Update:   B12 and folate have normalized, anemia is still present, slightly worse than at discharge, iron  saturation is still quite low so needs to increase iron  sulfate to 3 times daily, continue vitamin B12 supplementation.

## 2023-08-18 LAB — COMPREHENSIVE METABOLIC PANEL
ALT: 10 [IU]/L (ref 0–44)
AST: 12 [IU]/L (ref 0–40)
Albumin: 4.1 g/dL (ref 3.8–4.8)
Alkaline Phosphatase: 33 [IU]/L — ABNORMAL LOW (ref 44–121)
BUN/Creatinine Ratio: 28 — ABNORMAL HIGH (ref 10–24)
BUN: 49 mg/dL — ABNORMAL HIGH (ref 8–27)
Bilirubin Total: <0.2 mg/dL (ref 0.0–1.2)
CO2: 19 mmol/L — ABNORMAL LOW (ref 20–29)
Calcium: 9.4 mg/dL (ref 8.6–10.2)
Chloride: 103 mmol/L (ref 96–106)
Creatinine, Ser: 1.75 mg/dL — ABNORMAL HIGH (ref 0.76–1.27)
Globulin, Total: 2.3 g/dL (ref 1.5–4.5)
Glucose: 215 mg/dL — ABNORMAL HIGH (ref 70–99)
Potassium: 5.4 mmol/L — ABNORMAL HIGH (ref 3.5–5.2)
Sodium: 135 mmol/L (ref 134–144)
Total Protein: 6.4 g/dL (ref 6.0–8.5)
eGFR: 39 mL/min/{1.73_m2} — ABNORMAL LOW (ref >59)

## 2023-08-18 LAB — B12 AND FOLATE PANEL
Folate: 11 ng/mL (ref >3.0)
Vitamin B-12: 847 pg/mL (ref 232–1245)

## 2023-08-18 LAB — CBC
Hematocrit: 23.9 % — ABNORMAL LOW (ref 37.5–51.0)
Hemoglobin: 7.5 g/dL — ABNORMAL LOW (ref 13.0–17.7)
MCH: 27.4 pg (ref 26.6–33.0)
MCHC: 31.4 g/dL — ABNORMAL LOW (ref 31.5–35.7)
MCV: 87 fL (ref 79–97)
Platelets: 441 10*3/uL (ref 150–450)
RBC: 2.74 x10E6/uL — CL (ref 4.14–5.80)
RDW: 15.4 % (ref 11.6–15.4)
WBC: 4.8 10*3/uL (ref 3.4–10.8)

## 2023-08-18 LAB — IRON,TIBC AND FERRITIN PANEL
Ferritin: 96 ng/mL (ref 30–400)
Iron Saturation: 16 % (ref 15–55)
Iron: 68 ug/dL (ref 38–169)
Total Iron Binding Capacity: 417 ug/dL (ref 250–450)
UIBC: 349 ug/dL — ABNORMAL HIGH (ref 111–343)

## 2023-08-18 LAB — RETICULOCYTES: Retic Ct Pct: 2.4 % (ref 0.6–2.6)

## 2023-08-18 NOTE — Addendum Note (Signed)
 Addended by: Monica Becton on: 08/18/2023 05:05 PM   Modules accepted: Orders

## 2023-08-22 ENCOUNTER — Telehealth: Payer: Self-pay

## 2023-08-22 DIAGNOSIS — D62 Acute posthemorrhagic anemia: Secondary | ICD-10-CM

## 2023-08-22 DIAGNOSIS — I10 Essential (primary) hypertension: Secondary | ICD-10-CM

## 2023-08-22 MED ORDER — FERROUS SULFATE 325 (65 FE) MG PO TBEC: 325.0000 mg | DELAYED_RELEASE_TABLET | Freq: Three times a day (TID) | ORAL | 11 refills | Status: AC

## 2023-08-22 NOTE — Telephone Encounter (Signed)
 Copied from CRM [id number]. Topic: Clinical - Lab/Test Results >> Aug 21, 2023 12:15 PM Nila Nephew wrote: Reason for CRM: Patient requesting a nurse to call him to discuss the lab results. Patient call-back number at mobile on file.

## 2023-08-22 NOTE — Telephone Encounter (Signed)
 Contacted the patient regarding his recent labs results. His concerns were addressed at time of call. Patient will need a refill for the iron  supplementation. Written by historical provider.   Patient stated that his blood pressure has been on the lower end (100's/55's). No other symptoms reported with low blood pressure readings. Patient informed to monitor blood pressure at home daily and to contact the office for an evaluation if readings remain low or new symptoms are noted. Patient agreeable with current plan.

## 2023-08-22 NOTE — Telephone Encounter (Signed)
 I will send a refill for iron to be taken 2-3 times daily, as for blood pressure lets go ahead and have him break his lisinopril/hydrochlorothiazide in half for now.  He should continue to monitor BP.

## 2023-08-23 NOTE — Progress Notes (Signed)
 Fax received from [facility] on 08/03/23 for medical clearance/medication hold for bilateral facet injections to be signed by T. Edgardo Goodwill, MD.  Provider signed on 08/22/23, scanned into pt's chart, media routed via MyChart to sender on 08/23/23.

## 2023-08-24 NOTE — Telephone Encounter (Signed)
Yes I think that would be fine

## 2023-08-24 NOTE — Telephone Encounter (Signed)
Patient informed. 

## 2023-08-24 NOTE — Telephone Encounter (Signed)
Patient informed . Will take half of lisinopril/hydrochlorothiazide for now and will monitor BP . Will take inron 2 to 3 times daily.  Patient also wanting to know if O.K. for him to take the covid vaccine now ?

## 2023-08-25 ENCOUNTER — Other Ambulatory Visit: Payer: Self-pay

## 2023-08-25 DIAGNOSIS — I739 Peripheral vascular disease, unspecified: Secondary | ICD-10-CM

## 2023-08-29 ENCOUNTER — Encounter (HOSPITAL_COMMUNITY): Payer: PPO

## 2023-08-30 ENCOUNTER — Ambulatory Visit
Admission: RE | Admit: 2023-08-30 | Discharge: 2023-08-30 | Disposition: A | Discharge status: Home / Self Care | Payer: PPO | Source: Ambulatory Visit | Attending: Sports Medicine | Admitting: Sports Medicine

## 2023-08-30 ENCOUNTER — Other Ambulatory Visit: Payer: Self-pay | Admitting: Sports Medicine

## 2023-08-30 ENCOUNTER — Ambulatory Visit
Admission: RE | Admit: 2023-08-30 | Discharge: 2023-08-30 | Discharge status: Home / Self Care | Payer: PPO | Source: Ambulatory Visit | Attending: Sports Medicine | Admitting: Sports Medicine

## 2023-08-30 DIAGNOSIS — D500 Iron deficiency anemia secondary to blood loss (chronic): Secondary | ICD-10-CM

## 2023-08-30 DIAGNOSIS — M47816 Spondylosis without myelopathy or radiculopathy, lumbar region: Secondary | ICD-10-CM | POA: Diagnosis not present

## 2023-08-30 DIAGNOSIS — N1832 Chronic kidney disease, stage 3b: Secondary | ICD-10-CM

## 2023-08-30 MED ORDER — METHYLPREDNISOLONE ACETATE 40 MG/ML INJ SUSP (RADIOLOG
80.0000 mg | Freq: Once | INTRAMUSCULAR | Status: AC
  Administered 2023-08-30: 80 mg via INTRA_ARTICULAR

## 2023-08-30 MED ORDER — IOPAMIDOL (ISOVUE-M 200) INJECTION 41%
1.0000 mL | Freq: Once | INTRAMUSCULAR | Status: AC
  Administered 2023-08-30: 1 mL via INTRA_ARTICULAR

## 2023-08-30 NOTE — Discharge Instructions (Signed)

## 2023-08-31 ENCOUNTER — Ambulatory Visit
Payer: PPO | Attending: Student in an Organized Health Care Education/Training Program | Admitting: Student in an Organized Health Care Education/Training Program

## 2023-08-31 ENCOUNTER — Encounter: Payer: Self-pay | Admitting: Student in an Organized Health Care Education/Training Program

## 2023-08-31 VITALS — BP 135/61 | HR 64 | Temp 97.7°F | Resp 16 | Ht 61.0 in | Wt 130.0 lb

## 2023-08-31 DIAGNOSIS — M47816 Spondylosis without myelopathy or radiculopathy, lumbar region: Secondary | ICD-10-CM | POA: Diagnosis not present

## 2023-08-31 DIAGNOSIS — M5136 Other intervertebral disc degeneration, lumbar region with discogenic back pain only: Secondary | ICD-10-CM

## 2023-08-31 NOTE — Progress Notes (Signed)
Patient: Michael Thomas  Service Category: E/M  Provider: Edward Jolly, MD  DOB: 1945/12/18  DOS: 08/31/2023  Referring Provider: Monica Becton  MRN: [id number]  Setting: Ambulatory outpatient  PCP: Monica Becton, MD  Type: New Patient  Specialty: Interventional Pain Management    Location: Office  Delivery: Face-to-face     Primary Reason(s) for Visit: Encounter for initial evaluation of one or more chronic problems (new to examiner) potentially causing chronic pain, and posing a threat to normal musculoskeletal function. (Level of risk: High) CC: Back Pain  HPI  Michael Thomas is a 78 y.o. year old, male patient, who comes for the first time to our practice referred by Monica Becton,* for our initial evaluation of his chronic pain. He has Essential hypertension, benign; Hyperlipidemia; BPH (benign prostatic hyperplasia) with detrusor instability/OAB; Type 2 diabetes mellitus with renal complication (HCC); Obstructive apnea; Prostate cancer screening; Left calf claudication, SFA arterial disease; Carpal tunnel syndrome on left; Annual physical exam; Primary osteoarthritis of both knees; Perennial allergic rhinitis; Mild renal insufficiency; LPRD (laryngopharyngeal reflux disease); Trigger thumb, right thumb; Muscle cramps; Stage 3b chronic kidney disease (HCC); PVD (peripheral vascular disease) (HCC); Pyelonephritis of left kidney; Diabetic nephropathy associated with type 2 diabetes mellitus (HCC); Lumbar facet arthropathy; PAD (peripheral artery disease) (HCC); Critical limb ischemia of right lower extremity (HCC); Symptomatic anemia; ABLA (acute blood loss anemia); Type 2 diabetes mellitus with chronic kidney disease, with long-term current use of insulin (HCC); Dyslipidemia; Essential hypertension; and Degeneration of intervertebral disc of lumbar region with discogenic back pain on their problem list. Today he comes in for evaluation of his Back Pain  Pain  Assessment: Location:   Back Radiating: denies Onset: Other (comment) (3 months ago during use of eliptical machine.) Duration: Chronic pain Quality: Aching (Pt denies pain at this time. States that only experiences pain during use of eliptical machine at with time back aches.) Severity: 0-No pain/10 (subjective, self-reported pain score)  Effect on ADL: denies Timing: Other (Comment) (only during use of eliptical machine) Modifying factors: not using eliptical machine, rest BP: 135/61  HR: 64  Onset and Duration: Sudden Cause of pain:  after use of eliptical machine Severity: NAS-11 at its worse: 9/10, NAS-11 at its best: 0/10, and NAS-11 now: 0/10 Timing: After activity or exercise Aggravating Factors: Bending, Squatting, and Walking Alleviating Factors: Resting and Sitting Associated Problems: none noted Quality of Pain: Agonizing and Uncomfortable Previous Examinations or Tests: MRI scan and X-rays Previous Treatments: left facet steroid injection  Michael Thomas is being evaluated for possible interventional pain management therapies for the treatment of his chronic pain.  Discussed the use of AI scribe software for clinical note transcription with the patient, who gave verbal consent to proceed.  History of Present Illness   The patient presented with a complaint of low back pain. The pain was noted to be specifically triggered by the use of an elliptical machine, with onset dating back to June or July. The pain was severe enough to affect the patient's mobility, particularly when climbing steps. However, the patient reported no current pain as he had ceased using the elliptical machine.  In an attempt to identify the cause of the pain, the patient underwent an X-ray and MRI. Following the imaging studies, the patient received a facet injection on the left side of the back, where the pain was localized. The patient reported no pain on the right side. The injection was performed while  the patient was on blood  thinners, which was a point of concern.        Meds   Current Outpatient Medications:    AMBULATORY NON FORMULARY MEDICATION, One Touch Ultra 2 glucometer, Disp: 1 each, Rfl: 0   aspirin EC 81 MG tablet, Take 81 mg by mouth at bedtime., Disp: , Rfl:    Blood Glucose Monitoring Suppl (ONETOUCH VERIO REFLECT) w/Device KIT, Check fasting blood sugar every morning and 2 hours after largest meal of the day.,, Disp: 1 kit, Rfl: 0   clobetasol ointment (TEMOVATE) 0.05 %, APPLY TOPICALLY TWO TIMES A DAY AS NEEDED, Disp: 45 g, Rfl: 2   clopidogrel (PLAVIX) 75 MG tablet, Take 1 tablet (75 mg total) by mouth daily., Disp: 30 tablet, Rfl: 11   cyanocobalamin (VITAMIN B12) 1000 MCG tablet, Take 1,000 mcg by mouth daily., Disp: , Rfl:    cyanocobalamin 1000 MCG tablet, Take 1 tablet (1,000 mcg total) by mouth daily., Disp: 90 tablet, Rfl: 1   fenofibrate 160 MG tablet, Take 1 tablet (160 mg total) by mouth daily., Disp: 90 tablet, Rfl: 3   ferrous sulfate 325 (65 FE) MG EC tablet, Take 1 tablet (325 mg total) by mouth 3 (three) times daily with meals., Disp: 90 tablet, Rfl: 11   glipiZIDE (GLUCOTROL XL) 10 MG 24 hr tablet, TAKE 1 TABLET BY MOUTH EVERY DAY WITH BREAKFAST, Disp: 90 tablet, Rfl: 3   Lancets (ONETOUCH DELICA PLUS LANCET33G) MISC, USE UP TO 3 TIMES DAILY., Disp: 100 each, Rfl: 11   lisinopril-hydrochlorothiazide (ZESTORETIC) 20-25 MG tablet, Take 0.5 tablets by mouth daily., Disp: , Rfl:    metFORMIN (GLUCOPHAGE) 1000 MG tablet, TAKE 1 TABLET (1,000 MG TOTAL) BY MOUTH TWICE A DAY WITH FOOD, Disp: 180 tablet, Rfl: 1   omega-3 acid ethyl esters (LOVAZA) 1 g capsule, TAKE 2 CAPSULES BY MOUTH TWICE A DAY, Disp: 360 capsule, Rfl: 3   ONETOUCH VERIO test strip, CHECK FASTING BLOOD SUGAR EVERY MORNING AND 2 HOURS AFTER LARGEST MEAL OF THE DAY.,, Disp: 100 strip, Rfl: 12   pioglitazone (ACTOS) 30 MG tablet, Take 1 tablet (30 mg total) by mouth at bedtime., Disp: 90 tablet, Rfl:  3   simvastatin (ZOCOR) 40 MG tablet, TAKE 1 TABLET BY MOUTH EVERYDAY AT BEDTIME, Disp: 90 tablet, Rfl: 0   solifenacin (VESICARE) 5 MG tablet, Take 1 tablet (5 mg total) by mouth daily., Disp: , Rfl:    tamsulosin (FLOMAX) 0.4 MG CAPS capsule, Take 1 capsule (0.4 mg total) by mouth 2 (two) times daily., Disp: 180 capsule, Rfl: 3   polyethylene glycol (MIRALAX) 17 g packet, Take 17 g by mouth daily. (Patient not taking: Reported on 08/31/2023), Disp: 90 packet, Rfl: 0  Imaging Review  Cervical Imaging: Cervical MR wo contrast: Results for orders placed in visit on 10/27/02  MR Cervical Spine Wo Contrast  Narrative FINDINGS CLINICAL DATA:     LEFT SIDED NECK AND SHOULDER PAIN. MRI OF THE CERVICAL SPINE WITHOUT CONTRAST: TECHNIQUE: SAGITTAL AND AXIAL MULTISEQUENCE IMAGES WERE OBTAINED THROUGH THE CERVICAL SPINE WITHOUT GADOLINIUM.  CORRELATION IS MADE WITH THE PLAIN FILMS DATED 10/07/02. FINDINGS: THE BONE MARROW SIGNAL IS NORMAL THROUGHOUT.  THE VISUALIZED PORTION OF THE PREVERTEBRAL SOFT TISSUES AND POSTERIOR FOSSA ARE NORMAL.  THERE IS STRAIGHTENING OF THE NORMAL CERVICAL LORDOSIS WHICH MAY BE RELATED TO POSITIONING VERSUS MUSCLE SPASM. C4-5:   THERE IS A MODERATE SIZED DISK HERNIATION TO THE  LEFT OF THE MIDLINE WHICH ENCROACHES UPON THE LEFT L4 NEURAL FORAMINA.  THE HERNIATED DISK  ALSO FLATTENS THE VENTRAL ASPECT OF THE CORD TO THE LEFT OF THE MIDLINE.  NO ABNORMAL CORD SIGNAL IS PRESENT. C5-6:   THERE IS FACET JOINT AND UNCINATE PROCESS HYPERTROPHY WHICH EFFACES THE VENTRAL SUBARACHNOID SPACE AND FLATTENS THE VENTRAL ASPECT OF THE CORD.  THERE IS ALSO MODERATE RIGHT NEURAL FORAMINAL NARROWING SECONDARY TO THE HYPERTROPHIC CHANGES. IMPRESSION 1.  MODERATE SIZED FOCAL DISK HERNIATION TO THE LEFT OF THE MIDLINE AT L4-5 WHICH ENCROACHES UPON THE LEFT NEURAL FORAMINA.  THERE IS ALSO FLATTENING OF THE VENTRAL ASPECT OF THE CORD AT THIS LEVEL SECONDARY TO THE HERNIATED DISK. Marland Kitchen  MILD  CENTRAL CANAL STENOSIS WITH FLATTENING OF THE CORD AT C5-6 SECONDARY TO POSTERIOR/UNCINATE PROCESS HYPERTROPHY.  MODERATE RIGHT NEURAL FORAMINAL NARROWING IS ALSO PRESENT AT THIS LEVEL SECONDARY TO HYPERTROPHIC CHANGES.   DG Cervical Spine 1 View  Narrative FINDINGS CLINICAL DATA:  CERVICAL SPINE SURGERY. CERVICAL SPINE, ONE VIEW LATERAL VIEW OF THE CERVICAL SPINE REVEALS A PLATE AND SCREWS AND INTERBODY PLUG AT C4-5. IMPRESSION SATISFACTORY FUSION AT C4-5.   Narrative Clinical Data: Cervical pain. Previous anterior cervical diskectomy and fusion. CERVICAL SPINE - TWO VIEWS Two views of the cervical spine shows an anterior cervical diskectomy and fusion at the C4-5 level. Anterior hardware appears intact and alignment appears satisfactory. C5-6 degenerative disk space narrowing noted. IMPRESSION Stable appearance following anterior cervical diskectomy and fusion at the C4-5 level. C5-6 degenerative disk space narrowing.  Provider: Wandra Arthurs   Narrative FINDINGS CLINICAL DATA:  NECK PAIN ONE VIEW CERVICAL SPINE LATERAL VIEW IS COMPARED WITH PRIORS OF 02/11/03.  WELL FORMED, WELL POSITIONED INTERBODY FUSION AT C4-5 INTERSPACE.  NO CHANGE IN THE WELL POSITIONED ANTERIOR PLATES ATTACHED TO WELL POSITIONED SCREWS AT C4-5.  MODERATE LOSS OF HEIGHT AND DEGENERATIVE CHANGES OF C5-6 INTERSPACE.  THE BONES REMAIN WELL ALIGNED.  THE SOFT TISSUES ARE UNREMARKABLE. IMPRESSION 1.  NO CHANGE IN THE GOOD POSITION AND ALIGNMENT C4-5 FUSION. 2.  DEGENERATIVE DISC DISEASE AT C5-6.  MR LUMBAR SPINE WO CONTRAST  Narrative CLINICAL DATA:  Low back pain, no known injury  EXAM: MRI LUMBAR SPINE WITHOUT CONTRAST  TECHNIQUE: Multiplanar, multisequence MR imaging of the lumbar spine was performed. No intravenous contrast was administered.  COMPARISON:  11/13/2017  FINDINGS: Segmentation:  Standard.  Alignment:  Physiologic.  Vertebrae: No acute fracture, evidence of discitis, or  aggressive bone lesion.  Conus medullaris and cauda equina: Conus extends to the T12-L1 level. Conus and cauda equina appear normal.  Paraspinal and other soft tissues: No acute paraspinal abnormality. Bilateral renal cysts.  Disc levels:  Disc spaces: Mild disc height loss at L2-3.  T12-L1: No significant disc bulge. No neural foraminal stenosis. No central canal stenosis.  L1-L2: No significant disc bulge. No neural foraminal stenosis. No central canal stenosis.  L2-L3: Mild disc bulge. Mild right lateral recess stenosis. Minimal spinal stenosis. No foraminal or central canal stenosis.  L3-L4: Mild disc bulge. Mild bilateral facet arthropathy. No foraminal or central canal stenosis.  L4-L5: Mild disc bulge. Moderate bilateral facet arthropathy. No foraminal or central canal stenosis.  L5-S1: Mild disc bulge. Moderate bilateral facet arthropathy. No foraminal or central canal stenosis.  IMPRESSION: 1. Mild lumbar spine spondylosis as described above. 2. No acute osseous injury of the lumbar spine.   Electronically Signed By: Elige Ko M.D. On: 07/19/2023 12:42    DG Lumbar Spine Complete  Narrative CLINICAL DATA:  Pain  EXAM: LUMBAR SPINE - COMPLETE 5 VIEW  COMPARISON:  None Available.  FINDINGS: No fracture, dislocation or subluxation. No spondylolisthesis. No osteolytic or osteoblastic changes. Atheromatous changes noted for the aorta.  Degenerative disc disease noted with disc space narrowing and marginal osteophytes at L2-3.  IMPRESSION: Degenerative changes. No acute osseous abnormalities.   Electronically Signed By: Layla Maw M.D. On: 11/29/2022 16:49        MR KNEE LEFT WO CONTRAST  Narrative CLINICAL DATA:  Knee pain since December 2018.  EXAM: MRI OF THE LEFT KNEE WITHOUT CONTRAST  TECHNIQUE: Multiplanar, multisequence MR imaging of the knee was performed. No intravenous contrast was administered.  COMPARISON:   None.  FINDINGS: MENISCI  Medial meniscus:  Intact  Lateral meniscus:  Intact  LIGAMENTS  Cruciates:  Intact  Collaterals:  Intact  CARTILAGE  Patellofemoral: Advanced degenerative chondrosis involving the lateral patellar facet with marked cartilage thinning and subchondral cystic change and edema.  Medial:  Minimal degenerative chondrosis for age.  Lateral:  Minimal degenerative chondrosis for age.  Joint: No joint effusion. There is thickening and inflammation of the quadriceps fat pad noted.  Popliteal Fossa:  No popliteal mass or Baker's cyst.  Extensor Mechanism: The patella retinacular structures are intact and the quadriceps and patellar tendons are intact.  Bones:  No acute bony findings.  Other: Normal knee musculature.  IMPRESSION: 1. Advanced degenerative chondrosis along the lateral patellar facet with underlying subchondral cystic change. 2. Intact ligamentous structures and no acute bony findings. 3. No meniscal tears. 4. No joint effusion or Baker's cyst. 5. Mild thickening and inflammation of the quadriceps fat pad.   Electronically Signed By: Rudie Meyer M.D. On: 12/06/2017 11:38   Narrative CLINICAL DATA:  78 year old male with recurrent knee pain  EXAM: LEFT KNEE - COMPLETE 4+ VIEW; RIGHT KNEE - 1-2 VIEW  COMPARISON:  Radiographs 07/25/2018  FINDINGS: No acute fracture or dislocation. Mild degenerative arthritis of the left knee with mild medial compartment narrowing. Small suprapatellar spur. Trace left knee joint effusion. Unremarkable appearance of the right knee. Vascular stent in the right leg.  IMPRESSION: No acute abnormality. Mild degenerative arthritis left knee with mild medial compartment narrowing.   Electronically Signed By: Minerva Fester M.D. On: 05/03/2022 00:42   Narrative CLINICAL DATA:  Chronic bilateral knee pain.  No known injury.  EXAM: LEFT KNEE - COMPLETE 4+ VIEW; RIGHT KNEE - COMPLETE 4+  VIEW  COMPARISON:  None.  FINDINGS: No evidence of fracture, dislocation, or joint effusion. Mild patellofemoral osteophytosis is noted. No chondrocalcinosis. Soft tissues are unremarkable.  IMPRESSION: No acute abnormality.  Mild bilateral patellofemoral osteoarthritis.   Electronically Signed By: Drusilla Kanner M.D. On: 07/25/2018 15:31  Knee-L DG 4 views: Results for orders placed in visit on 05/02/22  DG Knee Complete 4 Views Left  Narrative CLINICAL DATA:  78 year old male with recurrent knee pain  EXAM: LEFT KNEE - COMPLETE 4+ VIEW; RIGHT KNEE - 1-2 VIEW  COMPARISON:  Radiographs 07/25/2018  FINDINGS: No acute fracture or dislocation. Mild degenerative arthritis of the left knee with mild medial compartment narrowing. Small suprapatellar spur. Trace left knee joint effusion. Unremarkable appearance of the right knee. Vascular stent in the right leg.  IMPRESSION: No acute abnormality. Mild degenerative arthritis left knee with mild medial compartment narrowing.   Electronically Signed By: Minerva Fester M.D. On: 05/03/2022 00:42    Complexity Note: Imaging results reviewed.                         ROS  Cardiovascular: Daily Aspirin intake, High blood pressure, and Blood thinners:  Anticoagulant Pulmonary or Respiratory: Snoring  and Temporary stoppage of breathing during sleep Neurological: No reported neurological signs or symptoms such as seizures, abnormal skin sensations, urinary and/or fecal incontinence, being born with an abnormal open spine and/or a tethered spinal cord Psychological-Psychiatric: No reported psychological or psychiatric signs or symptoms such as difficulty sleeping, anxiety, depression, delusions or hallucinations (schizophrenial), mood swings (bipolar disorders) or suicidal ideations or attempts Gastrointestinal: No reported gastrointestinal signs or symptoms such as vomiting or evacuating blood, reflux, heartburn, alternating  episodes of diarrhea and constipation, inflamed or scarred liver, or pancreas or irrregular and/or infrequent bowel movements Genitourinary: No reported renal or genitourinary signs or symptoms such as difficulty voiding or producing urine, peeing blood, non-functioning kidney, kidney stones, difficulty emptying the bladder, difficulty controlling the flow of urine, or chronic kidney disease Hematological: Weakness due to low blood hemoglobin or red blood cell count (Anemia) Endocrine: High blood sugar controlled without the use of insulin (NIDDM) Rheumatologic: No reported rheumatological signs and symptoms such as fatigue, joint pain, tenderness, swelling, redness, heat, stiffness, decreased range of motion, with or without associated rash Musculoskeletal: Negative for myasthenia gravis, muscular dystrophy, multiple sclerosis or malignant hyperthermia Work History: Retired  Allergies  Michael Thomas is allergic to amoxicillin.  Laboratory Chemistry Profile   Renal Lab Results  Component Value Date   BUN 49 (H) 08/17/2023   CREATININE 1.75 (H) 08/17/2023   LABCREA 80 09/06/2022   BCR 28 (H) 08/17/2023   GFRAA 59 (L) 05/07/2020   GFRNONAA 44 (L) 08/08/2023   SPECGRAV 1.025 04/30/2015   PHUR 5.5 04/30/2015   PROTEINUR NEGATIVE 06/08/2023     Electrolytes Lab Results  Component Value Date   NA 135 08/17/2023   K 5.4 (H) 08/17/2023   CL 103 08/17/2023   CALCIUM 9.4 08/17/2023   MG 1.9 08/08/2023   PHOS 3.7 08/08/2023     Hepatic Lab Results  Component Value Date   AST 12 08/17/2023   ALT 10 08/17/2023   ALBUMIN 4.1 08/17/2023   ALKPHOS 33 (L) 08/17/2023   LIPASE 29 04/21/2022     ID Lab Results  Component Value Date   HIV REPEATEDLY REACTIVE (A) 07/25/2018   SARSCOV2NAA NEGATIVE 04/21/2022   STAPHAUREUS NEGATIVE 06/08/2023   MRSAPCR NEGATIVE 06/08/2023     Bone No results found for: "VD25OH", "VD125OH2TOT", "ZO1096EA5", "WU9811BJ4", "25OHVITD1", "25OHVITD2",  "25OHVITD3", "TESTOFREE", "TESTOSTERONE"   Endocrine Lab Results  Component Value Date   GLUCOSE 215 (H) 08/17/2023   GLUCOSEU NEGATIVE 06/08/2023   HGBA1C 7.0 (H) 08/03/2023   TSH 0.431 (L) 08/03/2023     Neuropathy Lab Results  Component Value Date   VITAMINB12 847 08/17/2023   FOLATE 11.0 08/17/2023   HGBA1C 7.0 (H) 08/03/2023   HIV REPEATEDLY REACTIVE (A) 07/25/2018     CNS No results found for: "COLORCSF", "APPEARCSF", "RBCCOUNTCSF", "WBCCSF", "POLYSCSF", "LYMPHSCSF", "EOSCSF", "PROTEINCSF", "GLUCCSF", "JCVIRUS", "CSFOLI", "IGGCSF", "LABACHR", "ACETBL"   Inflammation (CRP: Acute  ESR: Chronic) Lab Results  Component Value Date   ESRSEDRATE 11 11/19/2015     Rheumatology No results found for: "RF", "ANA", "LABURIC", "URICUR", "LYMEIGGIGMAB", "LYMEABIGMQN", "HLAB27"   Coagulation Lab Results  Component Value Date   INR 1.0 08/03/2023   LABPROT 10.9 08/03/2023   APTT 25 08/03/2023   PLT 441 08/17/2023   LABHEMA Note: 08/03/2023     Cardiovascular Lab Results  Component Value Date   CKTOTAL 56 04/04/2018   HGB 7.5 (L) 08/17/2023  HCT 23.9 (L) 08/17/2023     Screening Lab Results  Component Value Date   SARSCOV2NAA NEGATIVE 04/21/2022   STAPHAUREUS NEGATIVE 06/08/2023   MRSAPCR NEGATIVE 06/08/2023   HIV REPEATEDLY REACTIVE (A) 07/25/2018     Cancer No results found for: "CEA", "CA125", "LABCA2"   Allergens No results found for: "ALMOND", "APPLE", "ASPARAGUS", "AVOCADO", "BANANA", "BARLEY", "BASIL", "BAYLEAF", "GREENBEAN", "LIMABEAN", "WHITEBEAN", "BEEFIGE", "REDBEET", "BLUEBERRY", "BROCCOLI", "CABBAGE", "MELON", "CARROT", "CASEIN", "CASHEWNUT", "CAULIFLOWER", "CELERY"     Note: Lab results reviewed.  PFSH  Drug: Michael Thomas  reports no history of drug use. Alcohol:  reports no history of alcohol use. Tobacco:  reports that he has never smoked. He has never used smokeless tobacco. Medical:  has a past medical history of Benign prostatic hyperplasia  with urinary obstruction (03/25/2015), Chronic kidney disease, Diabetes mellitus without complication (HCC), History of kidney stones, Hyperlipidemia, Hypertension, OSA (obstructive sleep apnea), Peripheral arterial disease (HCC), and Vitamin D deficiency. Family: family history includes Diabetes Mellitus II in his mother; Stroke in his mother.  Past Surgical History:  Procedure Laterality Date   ABDOMINAL AORTOGRAM W/LOWER EXTREMITY N/A 05/26/2023   Procedure: ABDOMINAL AORTOGRAM W/LOWER EXTREMITY;  Surgeon: [name], MD;  Location: [facility];  Service: Cardiovascular;  Laterality: N/A;   ABDOMINAL AORTOGRAM W/LOWER EXTREMITY N/A 07/21/2023   Procedure: ABDOMINAL AORTOGRAM W/LOWER EXTREMITY;  Surgeon: [name], MD;  Location: [facility];  Service: Cardiovascular;  Laterality: N/A;   BIOPSY  08/07/2023   Procedure: BIOPSY;  Surgeon: Regis Bill, MD;  Location: [facility];  Service: Endoscopy;;   COLONOSCOPY     COLONOSCOPY WITH PROPOFOL N/A 08/07/2023   Procedure: COLONOSCOPY WITH PROPOFOL;  Surgeon: Regis Bill, MD;  Location: [facility];  Service: Endoscopy;  Laterality: N/A;   DG ARTHRO THUMB*L*     ENDARTERECTOMY FEMORAL Right 06/15/2023   Procedure: RIGHT COMMON FEMORAL ENDARTERECTOMY;  Surgeon: [name], MD;  [facility];  Service: Vascular;  Laterality: Right;   ESOPHAGOGASTRODUODENOSCOPY (EGD) WITH PROPOFOL N/A 08/07/2023   Procedure: ESOPHAGOGASTRODUODENOSCOPY (EGD) WITH PROPOFOL;  Surgeon: Regis Bill, MD;  Location: [facility];  Service: Endoscopy;  Laterality: N/A;   INTRAOPERATIVE ARTERIOGRAM Right 06/15/2023   Procedure: INTRA OPERATIVE RIGHT LOWER EXTREMITY  ARTERIOGRAM;  Surgeon: [name], MD;  Location: [facility];  Service: Vascular;  Laterality: Right;   IR ANGIOGRAM EXTREMITY BILATERAL  02/20/2018   IR ANGIOGRAM EXTREMITY BILATERAL  04/05/2019   IR ANGIOGRAM EXTREMITY LEFT  05/07/2020   IR  ANGIOGRAM EXTREMITY LEFT  10/13/2022   IR ANGIOGRAM EXTREMITY RIGHT  02/15/2021   IR ANGIOGRAM EXTREMITY RIGHT  12/14/2021   IR ANGIOGRAM SELECTIVE EACH ADDITIONAL VESSEL  02/20/2018   IR ANGIOGRAM SELECTIVE EACH ADDITIONAL VESSEL  10/13/2022   IR FEM POP ART ATHERECT INC PTA MOD SED  02/20/2018   IR FEM POP ART ATHERECT INC PTA MOD SED  05/14/2019   IR FEM POP ART ATHERECT INC PTA MOD SED  05/07/2020   IR FEM POP ART ATHERECT INC PTA MOD SED  02/15/2021   IR FEM POP ART STENT INC PTA MOD SED  12/14/2021   IR ILIAC ART STENT INC PTA MOD SED  10/13/2022   IR INTRAVASCULAR ULTRASOUND NON CORONARY  02/15/2021   IR INTRAVASCULAR ULTRASOUND NON CORONARY  12/14/2021   IR INTRAVASCULAR ULTRASOUND NON CORONARY  10/13/2022   IR RADIOLOGIST EVAL & MGMT  02/01/2018   IR RADIOLOGIST EVAL & MGMT  03/20/2018   IR  RADIOLOGIST EVAL & MGMT  07/25/2018   IR RADIOLOGIST EVAL & MGMT  03/07/2019   IR RADIOLOGIST EVAL & MGMT  04/23/2019   IR RADIOLOGIST EVAL & MGMT  06/18/2019   IR RADIOLOGIST EVAL & MGMT  04/29/2020   IR RADIOLOGIST EVAL & MGMT  06/04/2020   IR RADIOLOGIST EVAL & MGMT  01/20/2021   IR RADIOLOGIST EVAL & MGMT  04/27/2021   IR RADIOLOGIST EVAL & MGMT  08/12/2021   IR RADIOLOGIST EVAL & MGMT  11/02/2021   IR RADIOLOGIST EVAL & MGMT  11/15/2021   IR RADIOLOGIST EVAL & MGMT  01/26/2022   IR RADIOLOGIST EVAL & MGMT  03/23/2022   IR RADIOLOGIST EVAL & MGMT  09/23/2022   IR RADIOLOGIST EVAL & MGMT  11/25/2022   IR RADIOLOGIST EVAL & MGMT  03/22/2023   IR US GUIDE VASC ACCESS LEFT  04/05/2019   IR US GUIDE VASC ACCESS LEFT  02/15/2021   IR US GUIDE VASC ACCESS LEFT  12/14/2021   IR US GUIDE VASC ACCESS RIGHT  02/20/2018   IR US GUIDE VASC ACCESS RIGHT  05/14/2019   IR US GUIDE VASC ACCESS RIGHT  05/07/2020   IR US GUIDE VASC ACCESS RIGHT  10/13/2022   NECK SURGERY     PATCH ANGIOPLASTY Right 06/15/2023   Procedure: PATCH ANGIOPLASTY USING Livia Snellen 1X6CM;  Surgeon: [name], MD;   Location: [facility];  Service: Vascular;  Laterality: Right;   PERIPHERAL VASCULAR BALLOON ANGIOPLASTY  07/21/2023   Procedure: PERIPHERAL VASCULAR BALLOON ANGIOPLASTY;  Surgeon: [name], MD;  Location: [facility];  Service: Cardiovascular;;   PERIPHERAL VASCULAR INTERVENTION  07/21/2023   Procedure: PERIPHERAL VASCULAR INTERVENTION;  Surgeon: [name], MD;  Location: [facility];  Service: Cardiovascular;;   VASECTOMY     Active Ambulatory Problems    Diagnosis Date Noted   Essential hypertension, benign 02/01/2013   Hyperlipidemia 02/01/2013   BPH (benign prostatic hyperplasia) with detrusor instability/OAB 03/25/2015   Type 2 diabetes mellitus with renal complication (HCC) 01/24/2014   Obstructive apnea 01/24/2014   Prostate cancer screening 05/03/2016   Left calf claudication, SFA arterial disease 09/19/2017   Carpal tunnel syndrome on left 12/25/2017   Annual physical exam 12/25/2017   Primary osteoarthritis of both knees 07/25/2018   Perennial allergic rhinitis 10/22/2018   Mild renal insufficiency 10/23/2018   LPRD (laryngopharyngeal reflux disease) 01/22/2019   Trigger thumb, right thumb 04/18/2019   Muscle cramps 08/18/2021   Stage 3b chronic kidney disease (HCC) 04/22/2022   PVD (peripheral vascular disease) (HCC) 04/22/2022   Pyelonephritis of left kidney 04/22/2022   Diabetic nephropathy associated with type 2 diabetes mellitus (HCC) 09/07/2022   Lumbar facet arthropathy 11/28/2022   PAD (peripheral artery disease) (HCC) 06/15/2023   Critical limb ischemia of right lower extremity (HCC) 06/15/2023   Symptomatic anemia 08/03/2023   ABLA (acute blood loss anemia) 08/04/2023   Type 2 diabetes mellitus with chronic kidney disease, with long-term current use of insulin (HCC) 08/04/2023   Dyslipidemia 08/04/2023   Essential hypertension 08/04/2023   Degeneration of intervertebral disc of lumbar region with discogenic back pain 08/31/2023    Resolved Ambulatory Problems    Diagnosis Date Noted   Vitamin D deficiency 02/01/2013   Other headache syndrome 11/19/2015   Skin lesion of right arm 03/20/2018   Suspicious nevus 07/25/2018   Screening for HIV (human immunodeficiency virus) 07/30/2018   Urinary frequency 07/26/2019   ESRD on hemodialysis (HCC) 09/02/2019  Rash 02/13/2020   Chest pain, suspect noncardiac 04/22/2022   GI bleeding 08/04/2023   Past Medical History:  Diagnosis Date   Benign prostatic hyperplasia with urinary obstruction 03/25/2015   Chronic kidney disease    Diabetes mellitus without complication (HCC)    History of kidney stones    Hypertension    OSA (obstructive sleep apnea)    Peripheral arterial disease (HCC)    Constitutional Exam  General appearance: Well nourished, well developed, and well hydrated. In no apparent acute distress Vitals:   08/31/23 1253  BP: 135/61  Pulse: 64  Resp: 16  Temp: 97.7 F (36.5 C)  SpO2: 100%  Weight: 130 lb (59 kg)  Height: 5\' 1"  (1.549 m)   BMI Assessment: Estimated body mass index is 24.56 kg/m as calculated from the following:   Height as of this encounter: 5\' 1"  (1.549 m).   Weight as of this encounter: 130 lb (59 kg).  BMI interpretation table: BMI level Category Range association with higher incidence of chronic pain  <18 kg/m2 Underweight   18.5-24.9 kg/m2 Ideal body weight   25-29.9 kg/m2 Overweight Increased incidence by 20%  30-34.9 kg/m2 Obese (Class I) Increased incidence by 68%  35-39.9 kg/m2 Severe obesity (Class II) Increased incidence by 136%  >40 kg/m2 Extreme obesity (Class III) Increased incidence by 254%   Patient's current BMI Ideal Body weight  Body mass index is 24.56 kg/m. Ideal body weight: 52.3 kg (115 lb 4.8 oz) Adjusted ideal body weight: 55 kg (121 lb 2.9 oz)   BMI Readings from Last 4 Encounters:  08/31/23 24.56 kg/m  08/17/23 25.32 kg/m  08/14/23 24.70 kg/m  08/05/23 25.20 kg/m   Wt Readings from  Last 4 Encounters:  08/31/23 130 lb (59 kg)  08/17/23 134 lb (60.8 kg)  08/14/23 130 lb 11.2 oz (59.3 kg)  08/05/23 133 lb 6.1 oz (60.5 kg)    Psych/Mental status: Alert, oriented x 3 (person, place, & time)       Eyes: PERLA Respiratory: No evidence of acute respiratory distress  Lumbar Spine Area Exam  Skin & Axial Inspection: No masses, redness, or swelling Alignment: Symmetrical Functional ROM: Unrestricted ROM       Stability: No instability detected Muscle Tone/Strength: Functionally intact. No obvious neuro-muscular anomalies detected. Sensory (Neurological): Musculoskeletal pain pattern Palpation: No palpable anomalies        Gait & Posture Assessment  Ambulation: Unassisted Gait: Relatively normal for age and body habitus Posture: WNL  Lower Extremity Exam    Side: Right lower extremity  Side: Left lower extremity  Stability: No instability observed          Stability: No instability observed          Skin & Extremity Inspection: Skin color, temperature, and hair growth are WNL. No peripheral edema or cyanosis. No masses, redness, swelling, asymmetry, or associated skin lesions. No contractures.  Skin & Extremity Inspection: Skin color, temperature, and hair growth are WNL. No peripheral edema or cyanosis. No masses, redness, swelling, asymmetry, or associated skin lesions. No contractures.  Functional ROM: Unrestricted ROM                  Functional ROM: Unrestricted ROM                  Muscle Tone/Strength: Functionally intact. No obvious neuro-muscular anomalies detected.  Muscle Tone/Strength: Functionally intact. No obvious neuro-muscular anomalies detected.  Sensory (Neurological): Unimpaired        Sensory (Neurological):  Unimpaired        DTR: Patellar: deferred today Achilles: deferred today Plantar: deferred today  DTR: Patellar: deferred today Achilles: deferred today Plantar: deferred today  Palpation: No palpable anomalies  Palpation: No palpable  anomalies    Assessment  Primary Diagnosis & Pertinent Problem List: The primary encounter diagnosis was Lumbar facet arthropathy (left). Diagnoses of Lumbar spondylosis and Degeneration of intervertebral disc of lumbar region with discogenic back pain were also pertinent to this visit.  Visit Diagnosis (New problems to examiner): 1. Lumbar facet arthropathy (left)   2. Lumbar spondylosis   3. Degeneration of intervertebral disc of lumbar region with discogenic back pain    Plan of Care (Initial workup plan)    Problem-specific plan: Assessment and Plan    Low Back Pain due to lumbar facet arthropathy Intermittent low back pain has been triggered by elliptical machine use since June/July 2024, with severe post-exercise pain that is absent when avoiding the elliptical. A recent MRI and facet injection were performed while on Plavix, which is not standard due to bleeding risk. Currently, there is no pain when avoiding the elliptical. Discussed the possibility of a repeat nerve block and nerve ablation for long-term relief, explaining that nerve ablation involves burning the nerves for prolonged pain relief. If pain recurs, schedule a follow-up in 3-4 weeks. Consider repeating the nerve block if pain returns, and nerve ablation is a potential option if pain relief is temporary.  Blood Thinner Management Plavix (clopidogrel) is being taken due to previous stent placement. An injection was performed without stopping Plavix, against standard recommendations due to bleeding risk. Emphasized the importance of proper blood thinner management and the need to confirm with a cardiologist or prescribing physician before stopping blood thinners for future procedures. Ensure proper communication regarding blood thinner management before future procedures and confirm with a cardiologist or prescribing physician before stopping blood thinners.  Follow-up Schedule a follow-up in 3-4 weeks if symptoms recur.  Coordinate future procedures and follow-ups in [place] as requested.       Provider-requested follow-up: Return for patient will call to schedule F2F appt prn.  Future Appointments  Date Time Provider Department Center  11/07/2023 10:00 AM [facility]  11/07/2023 10:30 AM [facility]  11/07/2023 11:20 AM [name], MD [facility]  02/14/2024 10:00 AM Monica Becton, MD [facility] None    Duration of encounter: .  Total time on encounter, as per AMA guidelines included both the face-to-face and non-face-to-face time personally spent by the physician and/or other qualified health care professional(s) on the day of the encounter (includes time in activities that require the physician or other qualified health care professional and does not include time in activities normally performed by clinical staff). Physician's time may include the following activities when performed: Preparing to see the patient (e.g., pre-charting review of records, searching for previously ordered imaging, lab work, and nerve conduction tests) Review of prior analgesic pharmacotherapies. Reviewing PMP Interpreting ordered tests (e.g., lab work, imaging, nerve conduction tests) Performing post-procedure evaluations, including interpretation of diagnostic procedures Obtaining and/or reviewing separately obtained history Performing a medically appropriate examination and/or evaluation Counseling and educating the patient/family/caregiver Ordering medications, tests, or procedures Referring and communicating with other health care professionals (when not separately reported) Documenting clinical information in the electronic or other health record Independently interpreting results (not separately reported) and communicating results to the patient/ family/caregiver Care coordination (not separately reported)  Note by:  Edward Jolly, MD (AI and TTS technology used. I  apologize for any typographical errors that were not detected and corrected.) Date: 08/31/2023; Time: 1:50 PM

## 2023-08-31 NOTE — Progress Notes (Signed)
Safety precautions to be maintained throughout the outpatient stay will include: orient to surroundings, keep bed in low position, maintain call bell within reach at all times, provide assistance with transfer out of bed and ambulation.  

## 2023-09-05 ENCOUNTER — Encounter: Payer: PPO | Admitting: Vascular Surgery

## 2023-09-05 ENCOUNTER — Other Ambulatory Visit: Payer: Self-pay | Admitting: Sports Medicine

## 2023-09-05 DIAGNOSIS — D509 Iron deficiency anemia, unspecified: Secondary | ICD-10-CM | POA: Diagnosis not present

## 2023-09-19 ENCOUNTER — Encounter: Payer: PPO | Admitting: Vascular Surgery

## 2023-09-19 ENCOUNTER — Encounter (HOSPITAL_COMMUNITY): Payer: PPO

## 2023-10-12 DIAGNOSIS — D509 Iron deficiency anemia, unspecified: Secondary | ICD-10-CM | POA: Diagnosis not present

## 2023-10-14 ENCOUNTER — Other Ambulatory Visit: Payer: Self-pay | Admitting: Sports Medicine

## 2023-10-14 DIAGNOSIS — E785 Hyperlipidemia, unspecified: Secondary | ICD-10-CM

## 2023-10-30 DIAGNOSIS — D631 Anemia in chronic kidney disease: Secondary | ICD-10-CM | POA: Diagnosis not present

## 2023-10-30 DIAGNOSIS — I10 Essential (primary) hypertension: Secondary | ICD-10-CM | POA: Diagnosis not present

## 2023-10-30 DIAGNOSIS — N16 Renal tubulo-interstitial disorders in diseases classified elsewhere: Secondary | ICD-10-CM | POA: Diagnosis not present

## 2023-10-30 DIAGNOSIS — K760 Fatty (change of) liver, not elsewhere classified: Secondary | ICD-10-CM | POA: Diagnosis not present

## 2023-10-30 DIAGNOSIS — N281 Cyst of kidney, acquired: Secondary | ICD-10-CM | POA: Diagnosis not present

## 2023-10-30 DIAGNOSIS — N1832 Chronic kidney disease, stage 3b: Secondary | ICD-10-CM | POA: Diagnosis not present

## 2023-10-30 DIAGNOSIS — N400 Enlarged prostate without lower urinary tract symptoms: Secondary | ICD-10-CM | POA: Diagnosis not present

## 2023-10-30 DIAGNOSIS — E785 Hyperlipidemia, unspecified: Secondary | ICD-10-CM | POA: Diagnosis not present

## 2023-10-30 DIAGNOSIS — E1122 Type 2 diabetes mellitus with diabetic chronic kidney disease: Secondary | ICD-10-CM | POA: Diagnosis not present

## 2023-10-30 DIAGNOSIS — R809 Proteinuria, unspecified: Secondary | ICD-10-CM | POA: Diagnosis not present

## 2023-10-31 DIAGNOSIS — D509 Iron deficiency anemia, unspecified: Secondary | ICD-10-CM | POA: Diagnosis not present

## 2023-11-03 ENCOUNTER — Ambulatory Visit: Admitting: Sports Medicine

## 2023-11-06 ENCOUNTER — Ambulatory Visit (INDEPENDENT_AMBULATORY_CARE_PROVIDER_SITE_OTHER): Admitting: Sports Medicine

## 2023-11-06 ENCOUNTER — Encounter: Payer: Self-pay | Admitting: Sports Medicine

## 2023-11-06 DIAGNOSIS — D62 Acute posthemorrhagic anemia: Secondary | ICD-10-CM

## 2023-11-06 DIAGNOSIS — M47816 Spondylosis without myelopathy or radiculopathy, lumbar region: Secondary | ICD-10-CM | POA: Diagnosis not present

## 2023-11-06 NOTE — Assessment & Plan Note (Signed)
 Call continues to improve, to recap he has PAD on anticoagulants, at the last visit we saw him and he was very tired, Hemoccult was positive, ultimately hemoglobin was in the sixes so we sent him up to the hospital, he was admitted, had a blood transfusion and was taken off of his anticoagulants. Endoscopy performed no obvious bleeding was seen, ultimately anemia workup revealed iron and B12 deficiency, we increased his iron supplementation to 3 times daily and continue B12 1000 mcg daily, his hemoglobin was stabilized after discharge, he did have labs checked again with his nephrologist last week, hemoglobin has improved from the mid sevens up to 10. At this point we will continue iron 3 times daily and B12 supplementation for 3 more months.  No further melena or hematochezia, feeling much better clinically. It sounds like he is working with GI, they are working with a capsule endoscopy.

## 2023-11-06 NOTE — Progress Notes (Unsigned)
 [facility] [place]  ASSESSMENT / PLAN: Michael Thomas is a 78 y.o. male with atherosclerosis of native arteries of right lower extremity causing intermittent claudication.  Recommend the following which can slow the progression of atherosclerosis and reduce the risk of major adverse cardiac / limb events:  Complete cessation from all tobacco products. Blood glucose control with goal A1c < 7%. Blood pressure control with goal blood pressure < 140/90 mmHg. Lipid reduction therapy with goal LDL-C <100 mg/dL (<13 if symptomatic from PAD).  Aspirin 81mg  PO QD.  Atorvastatin 40-80mg  PO QD (or other "high intensity" statin therapy).  Duplex shows open common femoral and profunda femoris artery, but occluded superficial femoral artery.  This is consistent with intraoperative duplex.  Not sure why symptoms have worsened, but we will repeat angiography with attempt to recanalize the superficial femoral artery.  CHIEF COMPLAINT: Claudication  HISTORY OF PRESENT ILLNESS: Michael Thomas is a 78 y.o. male referred from Dr. Loreta Ave for discussion of surgical options for intermittent claudication in bilateral lower extremities.  The patient is undergone multiple endovascular interventions for lifestyle limiting claudication by Dr. Loreta Ave.  He reports good symptom relief in the short-term after these interventions, but gradual return to baseline level of discomfort afterwards.  He has a good amount of exercise tolerance from my perspective.  He reports he can walk on a flat surface for greater than a mile.  He has difficulty on incline surfaces.  He has difficulty jogging.  04/25/23: Patient returns to clinic for evaluation.  He describes fairly classic claudication symptoms in his right calf which are bothersome to him.  These began at about 50 yards.  This is interfering with his lifestyle.  He strongly desires intervention.  05/16/23: Patient returns to clinic to review CT  angiogram. Unfortunately, his renal function did not permit this test, so we obtained duplex ultrasound of the aorta, iliacs, and RLE. We reviewed these and developed a plan to do angiography next.  07/04/23: Patient returns to clinic.  He initially felt better after surgery, but now reports claudication symptoms are worse.  His incision has healed appropriately.  Past Medical History:  Diagnosis Date   Benign prostatic hyperplasia with urinary obstruction 03/25/2015   Chronic kidney disease    Diabetes mellitus without complication (HCC)    History of kidney stones    Hyperlipidemia    Hypertension    OSA (obstructive sleep apnea)    Peripheral arterial disease (HCC)    Vitamin D deficiency     Past Surgical History:  Procedure Laterality Date   ABDOMINAL AORTOGRAM W/LOWER EXTREMITY N/A 05/26/2023   Procedure: ABDOMINAL AORTOGRAM W/LOWER EXTREMITY;  Surgeon: [name], MD;  [facility];  Service: Cardiovascular;  Laterality: N/A;   ABDOMINAL AORTOGRAM W/LOWER EXTREMITY N/A 07/21/2023   Procedure: ABDOMINAL AORTOGRAM W/LOWER EXTREMITY;  Surgeon: [name], MD;  Location: [facility];  Service: Cardiovascular;  Laterality: N/A;   BIOPSY  08/07/2023   Procedure: BIOPSY;  Surgeon: Regis Bill, MD;  Location: [facility] ENDOSCOPY;  Service: Endoscopy;;   COLONOSCOPY     COLONOSCOPY WITH PROPOFOL N/A 08/07/2023   Procedure: COLONOSCOPY WITH PROPOFOL;  Surgeon: Regis Bill, MD;  Location: [facility] ENDOSCOPY;  Service: Endoscopy;  Laterality: N/A;   DG ARTHRO THUMB*L*     ENDARTERECTOMY FEMORAL Right 06/15/2023   Procedure: RIGHT COMMON FEMORAL ENDARTERECTOMY;  Surgeon: [name], MD;  Location: [facility] OR;  Service: Vascular;  Laterality: Right;  ESOPHAGOGASTRODUODENOSCOPY (EGD) WITH PROPOFOL N/A 08/07/2023   Procedure: ESOPHAGOGASTRODUODENOSCOPY (EGD) WITH PROPOFOL;  Surgeon: Regis Bill, MD;  Location: [facility] ENDOSCOPY;  Service:  Endoscopy;  Laterality: N/A;   INTRAOPERATIVE ARTERIOGRAM Right 06/15/2023   Procedure: INTRA OPERATIVE RIGHT LOWER EXTREMITY  ARTERIOGRAM;  Surgeon: [name], MD;  Location: [facility];  Service: Vascular;  Laterality: Right;   IR ANGIOGRAM EXTREMITY BILATERAL  02/20/2018   IR ANGIOGRAM EXTREMITY BILATERAL  04/05/2019   IR ANGIOGRAM EXTREMITY LEFT  05/07/2020   IR ANGIOGRAM EXTREMITY LEFT  10/13/2022   IR ANGIOGRAM EXTREMITY RIGHT  02/15/2021   IR ANGIOGRAM EXTREMITY RIGHT  12/14/2021   IR ANGIOGRAM SELECTIVE EACH ADDITIONAL VESSEL  02/20/2018   IR ANGIOGRAM SELECTIVE EACH ADDITIONAL VESSEL  10/13/2022   IR FEM POP ART ATHERECT INC PTA MOD SED  02/20/2018   IR FEM POP ART ATHERECT INC PTA MOD SED  05/14/2019   IR FEM POP ART ATHERECT INC PTA MOD SED  05/07/2020   IR FEM POP ART ATHERECT INC PTA MOD SED  02/15/2021   IR FEM POP ART STENT INC PTA MOD SED  12/14/2021   IR ILIAC ART STENT INC PTA MOD SED  10/13/2022   IR INTRAVASCULAR ULTRASOUND NON CORONARY  02/15/2021   IR INTRAVASCULAR ULTRASOUND NON CORONARY  12/14/2021   IR INTRAVASCULAR ULTRASOUND NON CORONARY  10/13/2022   IR RADIOLOGIST EVAL & MGMT  02/01/2018   IR RADIOLOGIST EVAL & MGMT  03/20/2018   IR RADIOLOGIST EVAL & MGMT  07/25/2018   IR RADIOLOGIST EVAL & MGMT  03/07/2019   IR RADIOLOGIST EVAL & MGMT  04/23/2019   IR RADIOLOGIST EVAL & MGMT  06/18/2019   IR RADIOLOGIST EVAL & MGMT  04/29/2020   IR RADIOLOGIST EVAL & MGMT  06/04/2020   IR RADIOLOGIST EVAL & MGMT  01/20/2021   IR RADIOLOGIST EVAL & MGMT  04/27/2021   IR RADIOLOGIST EVAL & MGMT  08/12/2021   IR RADIOLOGIST EVAL & MGMT  11/02/2021   IR RADIOLOGIST EVAL & MGMT  11/15/2021   IR RADIOLOGIST EVAL & MGMT  01/26/2022   IR RADIOLOGIST EVAL & MGMT  03/23/2022   IR RADIOLOGIST EVAL & MGMT  09/23/2022   IR RADIOLOGIST EVAL & MGMT  11/25/2022   IR RADIOLOGIST EVAL & MGMT  03/22/2023   IR US GUIDE VASC ACCESS LEFT  04/05/2019   IR US GUIDE VASC ACCESS LEFT   02/15/2021   IR US GUIDE VASC ACCESS LEFT  12/14/2021   IR US GUIDE VASC ACCESS RIGHT  02/20/2018   IR US GUIDE VASC ACCESS RIGHT  05/14/2019   IR US GUIDE VASC ACCESS RIGHT  05/07/2020   IR US GUIDE VASC ACCESS RIGHT  10/13/2022   NECK SURGERY     PATCH ANGIOPLASTY Right 06/15/2023   Procedure: PATCH ANGIOPLASTY USING Livia Snellen 1X6CM;  Surgeon: [name], MD;  Location: [facility];  Service: Vascular;  Laterality: Right;   PERIPHERAL VASCULAR BALLOON ANGIOPLASTY  07/21/2023   Procedure: PERIPHERAL VASCULAR BALLOON ANGIOPLASTY;  Surgeon: [name], MD;  [facility];  Service: Cardiovascular;;   PERIPHERAL VASCULAR INTERVENTION  07/21/2023   Procedure: PERIPHERAL VASCULAR INTERVENTION;  Surgeon: [name], MD;  [facility];  Service: Cardiovascular;;   VASECTOMY      Family History  Problem Relation Age of Onset   Stroke Mother    Diabetes Mellitus II Mother    Prostate cancer Neg Hx    Kidney cancer  Neg Hx    Bladder Cancer Neg Hx     Social History   Socioeconomic History   Marital status: Married    Spouse name: Not on file   Number of children: Not on file   Years of education: Not on file   Highest education level: Not on file  Occupational History   Not on file  Tobacco Use   Smoking status: Never   Smokeless tobacco: Never  Vaping Use   Vaping status: Never Used  Substance and Sexual Activity   Alcohol use: No   Drug use: No   Sexual activity: Yes    Birth control/protection: None  Other Topics Concern   Not on file  Social History Narrative   Not on file   Social Drivers of Health   Financial Resource Strain: Low Risk  (09/05/2023)   Received from [facility]   Overall Financial Resource Strain (CARDIA)    Difficulty of Paying Living Expenses: Not hard at all  Food Insecurity: No Food Insecurity (09/05/2023)   Received from [facility]   Hunger Vital Sign    Worried  About Running Out of Food in the Last Year: Never true    Ran Out of Food in the Last Year: Never true  Transportation Needs: No Transportation Needs (09/05/2023)   [facility]    In the past 12 months, has lack of transportation kept you from medical appointments or from getting medications?: No    Lack of Transportation (Non-Medical): No  Physical Activity: Not on file  Stress: Not on file  Social Connections: Socially Isolated (08/07/2023)   Social Connection and Isolation Panel [NHANES]    Frequency of Communication with Friends and Family: Never    Frequency of Social Gatherings with Friends and Family: Never    Attends Religious Services: Never    Database administrator or Organizations: No    Attends Banker Meetings: Never    Marital Status: Married  Catering manager Violence: Not At Risk (08/05/2023)   Humiliation, Afraid, Rape, and Kick questionnaire    Fear of Current or Ex-Partner: No    Emotionally Abused: No    Physically Abused: No    Sexually Abused: No    Allergies  Allergen Reactions   Amoxicillin Other (See Comments)    Fatigue and Myalgia and Rigors x2 attempts    Current Outpatient Medications  Medication Sig Dispense Refill   AMBULATORY NON FORMULARY MEDICATION One Touch Ultra 2 glucometer 1 each 0   aspirin EC 81 MG tablet Take 81 mg by mouth at bedtime.     Blood Glucose Monitoring Suppl (ONETOUCH VERIO REFLECT) w/Device KIT Check fasting blood sugar every morning and 2 hours after largest meal of the day., 1 kit 0   clobetasol ointment (TEMOVATE) 0.05 % APPLY TOPICALLY TWO TIMES A DAY AS NEEDED 45 g 2   clopidogrel (PLAVIX) 75 MG tablet Take 1 tablet (75 mg total) by mouth daily. 30 tablet 11   cyanocobalamin (VITAMIN B12) 1000 MCG tablet Take 1,000 mcg by mouth daily.     cyanocobalamin 1000 MCG tablet Take 1 tablet (1,000 mcg total) by mouth daily. 90 tablet 1   fenofibrate 160 MG  tablet TAKE 1 TABLET BY MOUTH EVERY DAY 90 tablet 3   ferrous sulfate 325 (65 FE) MG EC tablet Take 1 tablet (325 mg total) by mouth 3 (three) times daily with meals. 90  tablet 11   glipiZIDE (GLUCOTROL XL) 10 MG 24 hr tablet TAKE 1 TABLET BY MOUTH EVERY DAY WITH BREAKFAST 90 tablet 3   Lancets (ONETOUCH DELICA PLUS LANCET33G) MISC USE UP TO 3 TIMES DAILY. 100 each 11   lisinopril-hydrochlorothiazide (ZESTORETIC) 20-25 MG tablet Take 0.5 tablets by mouth daily.     metFORMIN (GLUCOPHAGE) 1000 MG tablet TAKE 1 TABLET (1,000 MG TOTAL) BY MOUTH TWICE A DAY WITH FOOD 180 tablet 1   omega-3 acid ethyl esters (LOVAZA) 1 g capsule TAKE 2 CAPSULES BY MOUTH TWICE A DAY 360 capsule 3   ONETOUCH VERIO test strip CHECK FASTING BLOOD SUGAR EVERY MORNING AND 2 HOURS AFTER LARGEST MEAL OF THE DAY., 100 strip 12   pioglitazone (ACTOS) 30 MG tablet Take 1 tablet (30 mg total) by mouth at bedtime. 90 tablet 3   simvastatin (ZOCOR) 40 MG tablet TAKE 1 TABLET BY MOUTH EVERYDAY AT BEDTIME 90 tablet 0   solifenacin (VESICARE) 5 MG tablet Take 1 tablet (5 mg total) by mouth daily.     tamsulosin (FLOMAX) 0.4 MG CAPS capsule Take 1 capsule (0.4 mg total) by mouth 2 (two) times daily. 180 capsule 3   No current facility-administered medications for this visit.    PHYSICAL EXAM There were no vitals filed for this visit.     Constitutional: well appearing. no distress. Appears well nourished.  Neurologic: CN intact. no focal findings. no sensory loss. Psychiatric:  Mood and affect symmetric and appropriate. Eyes:  No icterus. No conjunctival pallor. Ears, nose, throat:  mucous membranes moist. Midline trachea.  Cardiac: regular rate and rhythm.  Respiratory:  unlabored. Abdominal:  soft, non-tender, non-distended.  Peripheral vascular: 2+ L DP / PT pulse. Absent R DP / PT pulse Extremity: no edema. no cyanosis. no pallor.  Skin: no gangrene. no ulceration.  Lymphatic: no Stemmer's sign. no palpable  lymphadenopathy.  PERTINENT LABORATORY AND RADIOLOGIC DATA  Most recent CBC    Latest Ref Rng & Units 08/17/2023   10:22 AM 08/08/2023    4:24 AM 08/07/2023    2:29 AM  CBC  WBC 3.4 - 10.8 x10E3/uL 4.8  5.4  5.4   Hemoglobin 13.0 - 17.7 g/dL 7.5  7.7  8.2   Hematocrit 37.5 - 51.0 % 23.9  23.9  25.1   Platelets 150 - 450 x10E3/uL 441  529  505      Most recent CMP    Latest Ref Rng & Units 08/17/2023   10:22 AM 08/08/2023    4:24 AM 08/07/2023    2:29 AM  CMP  Glucose 70 - 99 mg/dL 098  119  98   BUN 8 - 27 mg/dL 49  27  27   Creatinine 0.76 - 1.27 mg/dL 1.47  8.29  5.62   Sodium 134 - 144 mmol/L 135  135  137   Potassium 3.5 - 5.2 mmol/L 5.4  4.3  4.3   Chloride 96 - 106 mmol/L 103  107  108   CO2 20 - 29 mmol/L 19  21  22    Calcium 8.6 - 10.2 mg/dL 9.4  8.8  8.7   Total Protein 6.0 - 8.5 g/dL 6.4  6.0  6.5   Total Bilirubin 0.0 - 1.2 mg/dL <1.3  0.5  0.7   Alkaline Phos 44 - 121 IU/L 33  28  23   AST 0 - 40 IU/L 12  13  16    ALT 0 - 44 IU/L 10  11  13  Renal function CrCl cannot be calculated (Patient's most recent lab result is older than the maximum 21 days allowed.).  HbA1c, POC (controlled diabetic range) (%)  Date Value  02/16/2022 7.7 (A)   Hgb A1c MFr Bld (%)  Date Value  08/03/2023 7.0 (H)    LDL Cholesterol (Calc)  Date Value Ref Range Status  09/06/2022 81 mg/dL (calc) Final    Comment:    Reference range: <100 . Desirable range <100 mg/dL for primary prevention;   <70 mg/dL for patients with CHD or diabetic patients  with > or = 2 CHD risk factors. Marland Kitchen LDL-C is now calculated using the Martin-Hopkins  calculation, which is a validated novel method providing  better accuracy than the Friedewald equation in the  estimation of LDL-C.  Horald Pollen et al. Lenox Ahr. 4098;119(14): 2061-2068  (http://education.[facility].com/faq/FAQ164)    LDL Cholesterol  Date Value Ref Range Status  06/16/2023 59 0 - 99 mg/dL Final    Comment:            Total Cholesterol/HDL:CHD Risk Coronary Heart Disease Risk Table                     Men   Women  1/2 Average Risk   3.4   3.3  Average Risk       5.0   4.4  2 X Average Risk   9.6   7.1  3 X Average Risk  23.4   11.0        Use the calculated Patient Ratio above and the CHD Risk Table to determine the patient's CHD Risk.        ATP III CLASSIFICATION (LDL):  <100     mg/dL   Optimal  782-956  mg/dL   Near or Above                    Optimal  130-159  mg/dL   Borderline  213-086  mg/dL   High  >578     mg/dL   Very High Performed at [facility], [address] [place], [place] [address]    Direct LDL  Date Value Ref Range Status  07/25/2018 58 <100 mg/dL Final    Comment:    . Desirable range <100 mg/dL for primary prevention;   <70 mg/dL for patients with CHD or diabetic patients  with > or = 2 CHD risk factors. .     Duplex shows patent common femoral artery and profunda femoris artery  Rande Brunt. Lenell Antu, MD [facility] Phone Number: [phone] 11/06/2023 10:59 AM  Total time spent on preparing this encounter including chart review, data review, collecting history, examining the patient, coordinating care for this established patient, 30 minutes.   Portions of this report may have been transcribed using voice recognition software.  Every effort has been made to ensure accuracy; however, inadvertent computerized transcription errors may still be present.

## 2023-11-06 NOTE — Assessment & Plan Note (Signed)
>>  ASSESSMENT AND PLAN FOR ABLA (ACUTE BLOOD LOSS ANEMIA) WRITTEN ON 11/06/2023 12:11 PM BY THEKKEKANDAM, DEBBY PARAS, MD  Call continues to improve, to recap he has PAD on anticoagulants, at the last visit we saw him and he was very tired, Hemoccult was positive, ultimately hemoglobin was in the sixes so we sent him up to the hospital, he was admitted, had a blood transfusion and was taken off of his anticoagulants. Endoscopy performed no obvious bleeding was seen, ultimately anemia workup revealed iron  and B12 deficiency, we increased his iron  supplementation to 3 times daily and continue B12 1000 mcg daily, his hemoglobin was stabilized after discharge, he did have labs checked again with his nephrologist last week, hemoglobin has improved from the mid sevens up to 10. At this point we will continue iron  3 times daily and B12 supplementation for 3 more months.  No further melena or hematochezia, feeling much better clinically. It sounds like he is working with GI, they are working with a capsule endoscopy.

## 2023-11-06 NOTE — Assessment & Plan Note (Signed)
 Called also has intermittent low back pain, MRI did show multiple disc protrusions, desiccation with the dominant finding of bilateral L4-S1 facet arthritis, he has had facet injections, L4-S1 (only left side was hurting at the time of the injections) and is doing a lot better. On further questioning he does a lot of spine extension exercises in the gym, but he really does not focus on the rotators, sidebending or flexion. I have advised a more comprehensive back stabilization approach in the gym, I have given him some home PT pointers. He will do this longitudinally.

## 2023-11-06 NOTE — Progress Notes (Signed)
    Procedures performed today:    None.  Independent interpretation of notes and tests performed by another provider:   None.  Brief History, Exam, Impression, and Recommendations:    ABLA (acute blood loss anemia) Call continues to improve, to recap he has PAD on anticoagulants, at the last visit we saw him and he was very tired, Hemoccult was positive, ultimately hemoglobin was in the sixes so we sent him up to the hospital, he was admitted, had a blood transfusion and was taken off of his anticoagulants. Endoscopy performed no obvious bleeding was seen, ultimately anemia workup revealed iron and B12 deficiency, we increased his iron supplementation to 3 times daily and continue B12 1000 mcg daily, his hemoglobin was stabilized after discharge, he did have labs checked again with his nephrologist last week, hemoglobin has improved from the mid sevens up to 10. At this point we will continue iron 3 times daily and B12 supplementation for 3 more months.  No further melena or hematochezia, feeling much better clinically. It sounds like he is working with GI, they are working with a capsule endoscopy.  Lumbar facet arthropathy Called also has intermittent low back pain, MRI did show multiple disc protrusions, desiccation with the dominant finding of bilateral L4-S1 facet arthritis, he has had facet injections, L4-S1 (only left side was hurting at the time of the injections) and is doing a lot better. On further questioning he does a lot of spine extension exercises in the gym, but he really does not focus on the rotators, sidebending or flexion. I have advised a more comprehensive back stabilization approach in the gym, I have given him some home PT pointers. He will do this longitudinally.  I spent 40 minutes of total time managing this patient today, this includes chart review, face to face, and non-face to face time.  The patient talked extensively about the pricing of iron at different  pharmacies.  He also talked extensively about his experiences with [facility] imaging and Dr. Cherylann Ratel.  ____________________________________________ Ihor Austin. Benjamin Stain, M.D., ABFM., CAQSM., AME. Primary Care and Sports Medicine [facility]  Adjunct Professor of Family Medicine  [employer]  Restaurant manager, fast food

## 2023-11-07 ENCOUNTER — Ambulatory Visit (INDEPENDENT_AMBULATORY_CARE_PROVIDER_SITE_OTHER)
Admission: RE | Admit: 2023-11-07 | Discharge: 2023-11-07 | Disposition: A | Discharge status: Home / Self Care | Payer: PPO | Source: Ambulatory Visit | Attending: Vascular Surgery | Admitting: Vascular Surgery

## 2023-11-07 ENCOUNTER — Ambulatory Visit: Payer: PPO | Admitting: Vascular Surgery

## 2023-11-07 ENCOUNTER — Encounter: Payer: Self-pay | Admitting: Vascular Surgery

## 2023-11-07 ENCOUNTER — Ambulatory Visit (HOSPITAL_COMMUNITY)
Admission: RE | Admit: 2023-11-07 | Discharge: 2023-11-07 | Disposition: A | Discharge status: Home / Self Care | Payer: PPO | Source: Ambulatory Visit | Attending: Vascular Surgery | Admitting: Vascular Surgery

## 2023-11-07 VITALS — BP 147/82 | HR 58 | Temp 97.9°F | Ht 61.0 in | Wt 137.0 lb

## 2023-11-07 DIAGNOSIS — N281 Cyst of kidney, acquired: Secondary | ICD-10-CM | POA: Diagnosis not present

## 2023-11-07 DIAGNOSIS — D631 Anemia in chronic kidney disease: Secondary | ICD-10-CM | POA: Diagnosis not present

## 2023-11-07 DIAGNOSIS — E785 Hyperlipidemia, unspecified: Secondary | ICD-10-CM | POA: Diagnosis not present

## 2023-11-07 DIAGNOSIS — I739 Peripheral vascular disease, unspecified: Secondary | ICD-10-CM

## 2023-11-07 DIAGNOSIS — N16 Renal tubulo-interstitial disorders in diseases classified elsewhere: Secondary | ICD-10-CM | POA: Diagnosis not present

## 2023-11-07 DIAGNOSIS — K760 Fatty (change of) liver, not elsewhere classified: Secondary | ICD-10-CM | POA: Diagnosis not present

## 2023-11-07 DIAGNOSIS — N1832 Chronic kidney disease, stage 3b: Secondary | ICD-10-CM | POA: Diagnosis not present

## 2023-11-07 DIAGNOSIS — I10 Essential (primary) hypertension: Secondary | ICD-10-CM | POA: Diagnosis not present

## 2023-11-07 DIAGNOSIS — E1122 Type 2 diabetes mellitus with diabetic chronic kidney disease: Secondary | ICD-10-CM | POA: Diagnosis not present

## 2023-11-07 DIAGNOSIS — R809 Proteinuria, unspecified: Secondary | ICD-10-CM | POA: Diagnosis not present

## 2023-11-07 DIAGNOSIS — N400 Enlarged prostate without lower urinary tract symptoms: Secondary | ICD-10-CM | POA: Diagnosis not present

## 2023-11-07 LAB — VAS US ABI WITH/WO TBI
Left ABI: 1.04
Right ABI: 1.05

## 2023-11-09 ENCOUNTER — Other Ambulatory Visit: Payer: Self-pay

## 2023-11-09 DIAGNOSIS — I70219 Atherosclerosis of native arteries of extremities with intermittent claudication, unspecified extremity: Secondary | ICD-10-CM

## 2023-12-19 ENCOUNTER — Ambulatory Visit (INDEPENDENT_AMBULATORY_CARE_PROVIDER_SITE_OTHER): Admitting: Sports Medicine

## 2023-12-19 ENCOUNTER — Ambulatory Visit: Payer: Self-pay | Admitting: Sports Medicine

## 2023-12-19 ENCOUNTER — Ambulatory Visit

## 2023-12-19 ENCOUNTER — Encounter: Payer: Self-pay | Admitting: Sports Medicine

## 2023-12-19 VITALS — BP 151/64 | HR 62 | Resp 20 | Ht 61.0 in | Wt 137.0 lb

## 2023-12-19 DIAGNOSIS — I700 Atherosclerosis of aorta: Secondary | ICD-10-CM | POA: Diagnosis not present

## 2023-12-19 DIAGNOSIS — R252 Cramp and spasm: Secondary | ICD-10-CM

## 2023-12-19 DIAGNOSIS — R059 Cough, unspecified: Secondary | ICD-10-CM | POA: Diagnosis not present

## 2023-12-19 DIAGNOSIS — R058 Other specified cough: Secondary | ICD-10-CM

## 2023-12-19 MED ORDER — DEXTROMETHORPHAN POLISTIREX ER 30 MG/5ML PO SUER: 30.0000 mg | Freq: Two times a day (BID) | ORAL | Status: DC

## 2023-12-19 MED ORDER — MAGNESIUM OXIDE 400 MG PO TABS: 800.0000 mg | ORAL_TABLET | Freq: Every day | ORAL | 3 refills | Status: DC

## 2023-12-19 MED ORDER — GABAPENTIN 300 MG PO CAPS
300.0000 mg | ORAL_CAPSULE | Freq: Every day | ORAL | 3 refills | Status: DC
Start: 2023-12-19 — End: 2024-04-22

## 2023-12-19 NOTE — Assessment & Plan Note (Signed)
 Cramps in the legs at night mostly when laying flat, not when walking, he does have PAD, this sounds more like a neurogenic claudication picture. He will do magnesium  oxide 800 milligrams nightly, gabapentin 300 nightly and we can revisit this in a couple of months. He did have some mild stenotic changes on his lumbar spine MRI.

## 2023-12-19 NOTE — Assessment & Plan Note (Signed)
 Cough for about 4 weeks status post recent viral illness. Head and neck exam is normal, lungs are clear, adding a chest x-ray, he can do dextromethorphan over-the-counter, he was only doing Mucinex , I explained that Mucinex  contains guaifenesin  which is expectorant which may increase cough. Return to see me as needed for this.

## 2023-12-19 NOTE — Progress Notes (Signed)
    Procedures performed today:    None.  Independent interpretation of notes and tests performed by another provider:   None.  Brief History, Exam, Impression, and Recommendations:    Muscle cramps Cramps in the legs at night mostly when laying flat, not when walking, he does have PAD, this sounds more like a neurogenic claudication picture. He will do magnesium  oxide 800 milligrams nightly, gabapentin 300 nightly and we can revisit this in a couple of months. He did have some mild stenotic changes on his lumbar spine MRI.  Post-viral cough syndrome Cough for about 4 weeks status post recent viral illness. Head and neck exam is normal, lungs are clear, adding a chest x-ray, he can do dextromethorphan over-the-counter, he was only doing Mucinex , I explained that Mucinex  contains guaifenesin  which is expectorant which may increase cough. Return to see me as needed for this.    ____________________________________________ Joselyn Nicely. Sandy Crumb, M.D., ABFM., CAQSM., AME. Primary Care and Sports Medicine [facility]  Adjunct Professor of [employer] Medicine  [employer]  Restaurant manager, fast food

## 2023-12-27 DIAGNOSIS — R3915 Urgency of urination: Secondary | ICD-10-CM | POA: Diagnosis not present

## 2023-12-27 DIAGNOSIS — R350 Frequency of micturition: Secondary | ICD-10-CM | POA: Diagnosis not present

## 2023-12-27 DIAGNOSIS — N401 Enlarged prostate with lower urinary tract symptoms: Secondary | ICD-10-CM | POA: Diagnosis not present

## 2023-12-28 ENCOUNTER — Other Ambulatory Visit: Payer: Self-pay | Admitting: Sports Medicine

## 2024-01-11 ENCOUNTER — Other Ambulatory Visit: Payer: Self-pay | Admitting: Sports Medicine

## 2024-01-24 DIAGNOSIS — Z961 Presence of intraocular lens: Secondary | ICD-10-CM | POA: Diagnosis not present

## 2024-01-24 DIAGNOSIS — H04123 Dry eye syndrome of bilateral lacrimal glands: Secondary | ICD-10-CM | POA: Diagnosis not present

## 2024-01-24 DIAGNOSIS — E119 Type 2 diabetes mellitus without complications: Secondary | ICD-10-CM | POA: Diagnosis not present

## 2024-01-24 LAB — HM DIABETES EYE EXAM

## 2024-02-05 ENCOUNTER — Ambulatory Visit: Admitting: Sports Medicine

## 2024-02-05 ENCOUNTER — Ambulatory Visit (INDEPENDENT_AMBULATORY_CARE_PROVIDER_SITE_OTHER): Admitting: Sports Medicine

## 2024-02-05 VITALS — BP 130/71 | HR 57 | Ht 61.0 in | Wt 136.0 lb

## 2024-02-05 DIAGNOSIS — M75102 Unspecified rotator cuff tear or rupture of left shoulder, not specified as traumatic: Secondary | ICD-10-CM | POA: Diagnosis not present

## 2024-02-05 DIAGNOSIS — Z7984 Long term (current) use of oral hypoglycemic drugs: Secondary | ICD-10-CM

## 2024-02-05 DIAGNOSIS — D62 Acute posthemorrhagic anemia: Secondary | ICD-10-CM

## 2024-02-05 DIAGNOSIS — E291 Testicular hypofunction: Secondary | ICD-10-CM | POA: Diagnosis not present

## 2024-02-05 DIAGNOSIS — E1121 Type 2 diabetes mellitus with diabetic nephropathy: Secondary | ICD-10-CM

## 2024-02-05 NOTE — Assessment & Plan Note (Signed)
 Michael Thomas has been doing a lot of overhead lifting, he has developed pain left shoulder, on exam he has significant weakness to abduction consistent with a supraspinatus tear. We will still start conservatively, arthritis Tylenol , home PT. Return to see me 6 to 8 weeks.  He does complain of difficulty putting on muscle, we will check his testosterone, if low we can supplement, this will also help with his cuff healing.

## 2024-02-05 NOTE — Progress Notes (Signed)
    Procedures performed today:    None.  Independent interpretation of notes and tests performed by another provider:   None.  Brief History, Exam, Impression, and Recommendations:    ABLA (acute blood loss anemia) Very pleasant 78 year old male, to recap he has PAD on anticoagulants, he came to see me, extreme fatigue, Hemoccult positive, hemoglobin in the sixes, he was sent to the hospital and admitted, he had a blood transfusion, endoscopy negative, I anemia workup did reveal iron  and B12 deficiency. Most recent hemoglobin was up to 10 with his nephrologist, we will recheck his iron  indices today.   Rotator cuff tear, left Glean has been doing a lot of overhead lifting, he has developed pain left shoulder, on exam he has significant weakness to abduction consistent with a supraspinatus tear. We will still start conservatively, arthritis Tylenol , home PT. Return to see me 6 to 8 weeks.  He does complain of difficulty putting on muscle, we will check his testosterone, if low we can supplement, this will also help with his cuff healing.    ____________________________________________ Debby PARAS. Curtis, M.D., ABFM., CAQSM., AME. Primary Care and Sports Medicine [facility]  Adjunct Professor of [facility] Medicine  [employer]  Restaurant manager, fast food

## 2024-02-05 NOTE — Assessment & Plan Note (Signed)
 Very pleasant 78 year old male, to recap he has PAD on anticoagulants, he came to see me, extreme fatigue, Hemoccult positive, hemoglobin in the sixes, he was sent to the hospital and admitted, he had a blood transfusion, endoscopy negative, I anemia workup did reveal iron  and B12 deficiency. Most recent hemoglobin was up to 10 with his nephrologist, we will recheck his iron  indices today.

## 2024-02-05 NOTE — Assessment & Plan Note (Signed)
>>  ASSESSMENT AND PLAN FOR ABLA (ACUTE BLOOD LOSS ANEMIA) WRITTEN ON 02/05/2024 10:00 AM BY THEKKEKANDAM, DEBBY PARAS, MD  Very pleasant 78 year old male, to recap he has PAD on anticoagulants, he came to see me, extreme fatigue, Hemoccult positive, hemoglobin in the sixes, he was sent to the hospital and admitted, he had a blood transfusion, endoscopy negative, I anemia workup did reveal iron  and B12 deficiency. Most recent hemoglobin was up to 10 with his nephrologist, we will recheck his iron  indices today.

## 2024-02-06 ENCOUNTER — Ambulatory Visit: Admitting: Sports Medicine

## 2024-02-06 ENCOUNTER — Ambulatory Visit: Payer: Self-pay | Admitting: Sports Medicine

## 2024-02-06 LAB — CBC
Hematocrit: 36.2 % — ABNORMAL LOW (ref 37.5–51.0)
Hemoglobin: 11.6 g/dL — ABNORMAL LOW (ref 13.0–17.7)
MCH: 28.3 pg (ref 26.6–33.0)
MCHC: 32 g/dL (ref 31.5–35.7)
MCV: 88 fL (ref 79–97)
Platelets: 322 10*3/uL (ref 150–450)
RBC: 4.1 x10E6/uL — ABNORMAL LOW (ref 4.14–5.80)
RDW: 14.1 % (ref 11.6–15.4)
WBC: 5.8 10*3/uL (ref 3.4–10.8)

## 2024-02-06 LAB — COMPREHENSIVE METABOLIC PANEL WITH GFR
ALT: 19 IU/L (ref 0–44)
AST: 19 IU/L (ref 0–40)
Albumin: 4.2 g/dL (ref 3.8–4.8)
Alkaline Phosphatase: 29 IU/L — ABNORMAL LOW (ref 44–121)
BUN/Creatinine Ratio: 29 — ABNORMAL HIGH (ref 10–24)
BUN: 50 mg/dL — ABNORMAL HIGH (ref 8–27)
Bilirubin Total: 0.2 mg/dL (ref 0.0–1.2)
CO2: 15 mmol/L — ABNORMAL LOW (ref 20–29)
Calcium: 10.1 mg/dL (ref 8.6–10.2)
Chloride: 105 mmol/L (ref 96–106)
Creatinine, Ser: 1.74 mg/dL — ABNORMAL HIGH (ref 0.76–1.27)
Globulin, Total: 2.7 g/dL (ref 1.5–4.5)
Glucose: 179 mg/dL — ABNORMAL HIGH (ref 70–99)
Potassium: 5.2 mmol/L (ref 3.5–5.2)
Sodium: 136 mmol/L (ref 134–144)
Total Protein: 6.9 g/dL (ref 6.0–8.5)
eGFR: 39 mL/min/{1.73_m2} — ABNORMAL LOW (ref >59)

## 2024-02-06 LAB — B12 AND FOLATE PANEL
Folate: 8.4 ng/mL (ref >3.0)
Vitamin B-12: 746 pg/mL (ref 232–1245)

## 2024-02-06 LAB — TESTOSTERONE, FREE, TOTAL, SHBG
Sex Hormone Binding: 23.6 nmol/L (ref 19.3–76.4)
Testosterone, Free: 10 pg/mL (ref 6.6–18.1)
Testosterone: 205 ng/dL — ABNORMAL LOW (ref 264–916)

## 2024-02-06 LAB — HEMOGLOBIN A1C
Est. average glucose Bld gHb Est-mCnc: 197 mg/dL
Hgb A1c MFr Bld: 8.5 % — ABNORMAL HIGH (ref 4.8–5.6)

## 2024-02-06 LAB — LIPID PANEL
Chol/HDL Ratio: 5.5 ratio — ABNORMAL HIGH (ref 0.0–5.0)
Cholesterol, Total: 155 mg/dL (ref 100–199)
HDL: 28 mg/dL — ABNORMAL LOW (ref >39)
LDL Chol Calc (NIH): 89 mg/dL (ref 0–99)
Triglycerides: 221 mg/dL — ABNORMAL HIGH (ref 0–149)
VLDL Cholesterol Cal: 38 mg/dL (ref 5–40)

## 2024-02-06 LAB — MICROALBUMIN / CREATININE URINE RATIO
Creatinine, Urine: 90.9 mg/dL
Microalb/Creat Ratio: 270 mg/g{creat} — ABNORMAL HIGH (ref 0–29)
Microalbumin, Urine: 245.3 ug/mL

## 2024-02-06 LAB — IRON,TIBC AND FERRITIN PANEL
Ferritin: 64 ng/mL (ref 30–400)
Iron Saturation: 25 % (ref 15–55)
Iron: 91 ug/dL (ref 38–169)
Total Iron Binding Capacity: 368 ug/dL (ref 250–450)
UIBC: 277 ug/dL (ref 111–343)

## 2024-02-06 LAB — TSH: TSH: 0.849 u[IU]/mL (ref 0.450–4.500)

## 2024-02-14 ENCOUNTER — Ambulatory Visit: Payer: PPO | Admitting: Sports Medicine

## 2024-02-29 ENCOUNTER — Other Ambulatory Visit: Payer: Self-pay | Admitting: Sports Medicine

## 2024-03-04 NOTE — Progress Notes (Deleted)
 [facility] [place]  ASSESSMENT / PLAN: Michael Thomas is a 78 y.o. male status post right femoral endarterectomy and femoral-popliteal angioplasty and stenting.  Recommend the following which can slow the progression of atherosclerosis and reduce the risk of major adverse cardiac / limb events:  Complete cessation from all tobacco products. Blood glucose control with goal A1c < 7%. Blood pressure control with goal blood pressure < 140/90 mmHg. Lipid reduction therapy with goal LDL-C <100 mg/dL (<29 if symptomatic from PAD).  Aspirin  81mg  PO QD.  Clopidogrel  75mg  by mouth daily Atorvastatin 40-80mg  PO QD (or other high intensity statin therapy).  Patient reports no symptoms in the right lower extremity.  He does have a early claudication symptoms in the left leg.  We will monitor this closely.  Continue dual antiplatelet therapy for now.  CHIEF COMPLAINT: Claudication  HISTORY OF PRESENT ILLNESS: Michael Thomas is a 78 y.o. male referred from Dr. Alona for discussion of surgical options for intermittent claudication in bilateral lower extremities.  The patient is undergone multiple endovascular interventions for lifestyle limiting claudication by Dr. Alona.  He reports good symptom relief in the short-term after these interventions, but gradual return to baseline level of discomfort afterwards.  He has a good amount of exercise tolerance from my perspective.  He reports he can walk on a flat surface for greater than a mile.  He has difficulty on incline surfaces.  He has difficulty jogging.  04/25/23: Patient returns to clinic for evaluation.  He describes fairly classic claudication symptoms in his right calf which are bothersome to him.  These began at about 50 yards.  This is interfering with his lifestyle.  He strongly desires intervention.  05/16/23: Patient returns to clinic to review CT angiogram. Unfortunately, his renal function did not permit  this test, so we obtained duplex ultrasound of the aorta, iliacs, and RLE. We reviewed these and developed a plan to do angiography next.  07/04/23: Patient returns to clinic.  He initially felt better after surgery, but now reports claudication symptoms are worse.  His incision has healed appropriately.  11/07/23: Patient returns to clinic for surveillance.  He is doing very well overall with his right lower extremity.  He does report some claudication symptoms in the left lower extremity while walking on a treadmill.  He continues to have good exercise tolerance, but as before is a bit frustrated by his claudication symptoms.  He is not yet ready to pursue intervention.  He has low level of faith in the noninvasive testing as it has been erroneous in him before.  Past Medical History:  Diagnosis Date   Benign prostatic hyperplasia with urinary obstruction 03/25/2015   Chronic kidney disease    Diabetes mellitus without complication (HCC)    History of kidney stones    Hyperlipidemia    Hypertension    OSA (obstructive sleep apnea)    Peripheral arterial disease (HCC)    Vitamin D deficiency     Past Surgical History:  Procedure Laterality Date   ABDOMINAL AORTOGRAM W/LOWER EXTREMITY N/A 05/26/2023   Procedure: ABDOMINAL AORTOGRAM W/LOWER EXTREMITY;  Surgeon: Magda Debby SAILOR, MD;  Location: [facility];  Service: Cardiovascular;  Laterality: N/A;   ABDOMINAL AORTOGRAM W/LOWER EXTREMITY N/A 07/21/2023   Procedure: ABDOMINAL AORTOGRAM W/LOWER EXTREMITY;  Surgeon: Magda Debby SAILOR, MD;  Location: [facility];  Service: Cardiovascular;  Laterality: N/A;   BIOPSY  08/07/2023   Procedure: BIOPSY;  Surgeon: Maryruth,  Ole DASEN, MD;  Location: [facility];  Service: Endoscopy;;   COLONOSCOPY     COLONOSCOPY WITH PROPOFOL  N/A 08/07/2023   Procedure: COLONOSCOPY WITH PROPOFOL ;  Surgeon: Maryruth Ole DASEN, MD;  Location: [facility] END[facility];  Service: Endoscopy;  Laterality: N/A;    DG ARTHRO THUMB*L*     ENDARTERECTOMY FEMORAL Right 06/15/2023   Procedure: RIGHT COMMON FEMORAL ENDARTERECTOMY;  Surgeon: Magda Debby SAILOR, MD;  Location: [facility] OR;  Service: Vascular;  Laterality: Right;   ESOPHAGOGASTRODUODENOSCOPY (EGD) WITH PROPOFOL  N/A 08/07/2023   Procedure: ESOPHAGOGASTRODUODENOSCOPY (EGD) WITH PROPOFOL ;  Surgeon: Maryruth Ole DASEN, MD;  Location: [facility] ENDOSCOPY;  Service: Endoscopy;  Laterality: N/A;   INTRAOPERATIVE ARTERIOGRAM Right 06/15/2023   Procedure: INTRA OPERATIVE RIGHT LOWER EXTREMITY  ARTERIOGRAM;  Surgeon: Magda Debby SAILOR, MD;  Location: [facility] OR;  Service: Vascular;  Laterality: Right;   IR ANGIOGRAM EXTREMITY BILATERAL  02/20/2018   IR ANGIOGRAM EXTREMITY BILATERAL  04/05/2019   IR ANGIOGRAM EXTREMITY LEFT  05/07/2020   IR ANGIOGRAM EXTREMITY LEFT  10/13/2022   IR ANGIOGRAM EXTREMITY RIGHT  02/15/2021   IR ANGIOGRAM EXTREMITY RIGHT  12/14/2021   IR ANGIOGRAM SELECTIVE EACH ADDITIONAL VESSEL  02/20/2018   IR ANGIOGRAM SELECTIVE EACH ADDITIONAL VESSEL  10/13/2022   IR FEM POP ART ATHERECT INC PTA MOD SED  02/20/2018   IR FEM POP ART ATHERECT INC PTA MOD SED  05/14/2019   IR FEM POP ART ATHERECT INC PTA MOD SED  05/07/2020   IR FEM POP ART ATHERECT INC PTA MOD SED  02/15/2021   IR FEM POP ART STENT INC PTA MOD SED  12/14/2021   IR ILIAC ART STENT INC PTA MOD SED  10/13/2022   IR INTRAVASCULAR ULTRASOUND NON CORONARY  02/15/2021   IR INTRAVASCULAR ULTRASOUND NON CORONARY  12/14/2021   IR INTRAVASCULAR ULTRASOUND NON CORONARY  10/13/2022   IR RADIOLOGIST EVAL & MGMT  02/01/2018   IR RADIOLOGIST EVAL & MGMT  03/20/2018   IR RADIOLOGIST EVAL & MGMT  07/25/2018   IR RADIOLOGIST EVAL & MGMT  03/07/2019   IR RADIOLOGIST EVAL & MGMT  04/23/2019   IR RADIOLOGIST EVAL & MGMT  06/18/2019   IR RADIOLOGIST EVAL & MGMT  04/29/2020   IR RADIOLOGIST EVAL & MGMT  06/04/2020   IR RADIOLOGIST EVAL & MGMT  01/20/2021   IR RADIOLOGIST EVAL & MGMT  04/27/2021   IR  RADIOLOGIST EVAL & MGMT  08/12/2021   IR RADIOLOGIST EVAL & MGMT  11/02/2021   IR RADIOLOGIST EVAL & MGMT  11/15/2021   IR RADIOLOGIST EVAL & MGMT  01/26/2022   IR RADIOLOGIST EVAL & MGMT  03/23/2022   IR RADIOLOGIST EVAL & MGMT  09/23/2022   IR RADIOLOGIST EVAL & MGMT  11/25/2022   IR RADIOLOGIST EVAL & MGMT  03/22/2023   IR US  GUIDE VASC ACCESS LEFT  04/05/2019   IR US  GUIDE VASC ACCESS LEFT  02/15/2021   IR US  GUIDE VASC ACCESS LEFT  12/14/2021   IR US  GUIDE VASC ACCESS RIGHT  02/20/2018   IR US  GUIDE VASC ACCESS RIGHT  05/14/2019   IR US  GUIDE VASC ACCESS RIGHT  05/07/2020   IR US  GUIDE VASC ACCESS RIGHT  10/13/2022   NECK SURGERY     PATCH ANGIOPLASTY Right 06/15/2023   Procedure: PATCH ANGIOPLASTY USING [name] 1X6CM;  Surgeon: Magda Debby SAILOR, MD;  Location: [facility];  Service: Vascular;  Laterality: Right;   PERIPHERAL VASCULAR BALLOON ANGIOPLASTY  07/21/2023   Procedure: PERIPHERAL VASCULAR BALLOON ANGIOPLASTY;  Surgeon: Magda Debby SAILOR, MD;  Location: [facility] INVASIVE CV LAB;  Service: Cardiovascular;;   PERIPHERAL VASCULAR INTERVENTION  07/21/2023   Procedure: PERIPHERAL VASCULAR INTERVENTION;  Surgeon: Magda Debby SAILOR, MD;  Location: [facility];  Service: Cardiovascular;;   VASECTOMY      Family History  Problem Relation Age of Onset   Stroke Mother    Diabetes Mellitus II Mother    Prostate cancer Neg Hx    Kidney cancer Neg Hx    Bladder Cancer Neg Hx     Social History   Socioeconomic History   Marital status: Married    Spouse name: Not on file   Number of children: Not on file   Years of education: Not on file   Highest education level: Not on file  Occupational History   Not on file  Tobacco Use   Smoking status: Never   Smokeless tobacco: Never  Vaping Use   Vaping status: Never Used  Substance and Sexual Activity   Alcohol use: No   Drug use: No   Sexual activity: Yes    Birth control/protection: None  Other Topics Concern   Not on file   Social History Narrative   Not on file   Social Drivers of Health   Financial Resource Strain: Low Risk  (09/05/2023)   Received from [facility]   Overall Financial Resource Strain (CARDIA)    Difficulty of Paying Living Expenses: Not hard at all  Food Insecurity: No Food Insecurity (09/05/2023)   Received from [facility]   Hunger Vital Sign    Within the past 12 months, you worried that your food would run out before you got the money to buy more.: Never true    Within the past 12 months, the food you bought just didn't last and you didn't have money to get more.: Never true  Transportation Needs: No Transportation Needs (09/05/2023)   Received from [facility] - Transportation    In the past 12 months, has lack of transportation kept you from medical appointments or from getting medications?: No    Lack of Transportation (Non-Medical): No  Physical Activity: Not on file  Stress: Not on file  Social Connections: Socially Isolated (08/07/2023)   Social Connection and Isolation Panel    Frequency of Communication with Friends and Family: Never    Frequency of Social Gatherings with Friends and Family: Never    Attends Religious Services: Never    Database administrator or Organizations: No    Attends Banker Meetings: Never    Marital Status: Married  Catering manager Violence: Not At Risk (08/05/2023)   Humiliation, Afraid, Rape, and Kick questionnaire    Fear of Current or Ex-Partner: No    Emotionally Abused: No    Physically Abused: No    Sexually Abused: No    Allergies  Allergen Reactions   Amoxicillin  Other (See Comments)    Fatigue and Myalgia and Rigors x2 attempts    Current Outpatient Medications  Medication Sig Dispense Refill   AMBULATORY NON FORMULARY MEDICATION One Touch Ultra 2 glucometer 1 each 0   aspirin  EC 81 MG tablet Take 81 mg by mouth at bedtime.     Blood Glucose  Monitoring Suppl (ONETOUCH VERIO REFLECT) w/Device KIT Check fasting blood sugar every morning and 2 hours after largest meal of the day., 1 kit 0   clobetasol  ointment (TEMOVATE ) 0.05 % APPLY TOPICALLY  TWO TIMES A DAY AS NEEDED 45 g 2   clopidogrel  (PLAVIX ) 75 MG tablet Take 1 tablet (75 mg total) by mouth daily. 30 tablet 11   cyanocobalamin  (VITAMIN B12) 1000 MCG tablet Take 1,000 mcg by mouth daily.     cyanocobalamin  1000 MCG tablet Take 1 tablet (1,000 mcg total) by mouth daily. 90 tablet 1   dextromethorphan  (DELSYM ) 30 MG/5ML liquid Take 5 mLs (30 mg total) by mouth 2 (two) times daily.     fenofibrate  160 MG tablet TAKE 1 TABLET BY MOUTH EVERY DAY 90 tablet 3   ferrous sulfate  325 (65 FE) MG EC tablet Take 1 tablet (325 mg total) by mouth 3 (three) times daily with meals. 90 tablet 11   gabapentin  (NEURONTIN ) 300 MG capsule Take 1 capsule (300 mg total) by mouth at bedtime. 30 capsule 3   glipiZIDE  (GLUCOTROL  XL) 10 MG 24 hr tablet TAKE 1 TABLET BY MOUTH EVERY DAY WITH BREAKFAST 90 tablet 3   Lancets (ONETOUCH DELICA PLUS LANCET33G) MISC USE UP TO 3 TIMES DAILY. 100 each 11   lisinopril -hydrochlorothiazide  (ZESTORETIC ) 20-25 MG tablet Take 0.5 tablets by mouth daily.     magnesium  oxide (MAG-OX) 400 MG tablet Take 2 tablets (800 mg total) by mouth at bedtime. 180 tablet 3   metFORMIN  (GLUCOPHAGE ) 1000 MG tablet TAKE 1 TABLET (1,000 MG TOTAL) BY MOUTH TWICE A DAY WITH FOOD 180 tablet 2   omega-3 acid ethyl esters (LOVAZA ) 1 g capsule TAKE 2 CAPSULES BY MOUTH TWICE A DAY 360 capsule 3   ONETOUCH VERIO test strip CHECK FASTING BLOOD SUGAR EVERY MORNING AND 2 HOURS AFTER LARGEST MEAL OF THE DAY., 100 strip 12   pioglitazone  (ACTOS ) 30 MG tablet Take 1 tablet (30 mg total) by mouth at bedtime. 90 tablet 3   simvastatin  (ZOCOR ) 40 MG tablet TAKE 1 TABLET BY MOUTH EVERYDAY AT BEDTIME 90 tablet 1   solifenacin (VESICARE) 5 MG tablet Take 1 tablet (5 mg total) by mouth daily.     tamsulosin   (FLOMAX ) 0.4 MG CAPS capsule Take 1 capsule (0.4 mg total) by mouth 2 (two) times daily. 180 capsule 3   No current facility-administered medications for this visit.    PHYSICAL EXAM There were no vitals filed for this visit.     Constitutional: well appearing. no distress. Appears well nourished.  Neurologic: CN intact. no focal findings. no sensory loss. Psychiatric:  Mood and affect symmetric and appropriate. Eyes:  No icterus. No conjunctival pallor. Ears, nose, throat:  mucous membranes moist. Midline trachea.  Cardiac: regular rate and rhythm.  Respiratory:  unlabored. Abdominal:  soft, non-tender, non-distended.  Peripheral vascular: 1+ L DP / PT pulse. 2+ R DP / PT pulse Extremity: no edema. no cyanosis. no pallor.  Skin: no gangrene. no ulceration.  Lymphatic: no Stemmer's sign. no palpable lymphadenopathy.  PERTINENT LABORATORY AND RADIOLOGIC DATA  Most recent CBC    Latest Ref Rng & Units 02/05/2024   10:35 AM 08/17/2023   10:22 AM 08/08/2023    4:24 AM  CBC  WBC 3.4 - 10.8 x10E3/uL 5.8  4.8  5.4   Hemoglobin 13.0 - 17.7 g/dL 88.3  7.5  7.7   Hematocrit 37.5 - 51.0 % 36.2  23.9  23.9   Platelets 150 - 450 x10E3/uL 322  441  529      Most recent CMP    Latest Ref Rng & Units 02/05/2024   10:35 AM 08/17/2023   10:22 AM 08/08/2023  4:24 AM  CMP  Glucose 70 - 99 mg/dL 820  784  858   BUN 8 - 27 mg/dL 50  49  27   Creatinine 0.76 - 1.27 mg/dL 8.25  8.24  8.40   Sodium 134 - 144 mmol/L 136  135  135   Potassium 3.5 - 5.2 mmol/L 5.2  5.4  4.3   Chloride 96 - 106 mmol/L 105  103  107   CO2 20 - 29 mmol/L 15  19  21    Calcium 8.6 - 10.2 mg/dL 89.8  9.4  8.8   Total Protein 6.0 - 8.5 g/dL 6.9  6.4  6.0   Total Bilirubin 0.0 - 1.2 mg/dL 0.2  <9.7  0.5   Alkaline Phos 44 - 121 IU/L 29  33  28   AST 0 - 40 IU/L 19  12  13    ALT 0 - 44 IU/L 19  10  11      Renal function CrCl cannot be calculated (Patient's most recent lab result is older than the maximum 21 days  allowed.).  HbA1c, POC (controlled diabetic range) (%)  Date Value  02/16/2022 7.7 (A)   Hgb A1c MFr Bld (%)  Date Value  02/05/2024 8.5 (H)    LDL Cholesterol (Calc)  Date Value Ref Range Status  09/06/2022 81 mg/dL (calc) Final    Comment:    Reference range: <100 . Desirable range <100 mg/dL for primary prevention;   <70 mg/dL for patients with CHD or diabetic patients  with > or = 2 CHD risk factors. SABRA LDL-C is now calculated using the Martin-Hopkins  calculation, which is a validated novel method providing  better accuracy than the Friedewald equation in the  estimation of LDL-C.  Gladis APPLETHWAITE et al. SANDREA. 7986;689(80): 2061-2068  (http://education.[employer].com/faq/FAQ164)    LDL Chol Calc (NIH)  Date Value Ref Range Status  02/05/2024 89 0 - 99 mg/dL Final   Direct LDL  Date Value Ref Range Status  07/25/2018 58 <100 mg/dL Final    Comment:    . Desirable range <100 mg/dL for primary prevention;   <70 mg/dL for patients with CHD or diabetic patients  with > or = 2 CHD risk factors. SABRA     +-------+-----------+-----------+------------+------------+  ABI/TBIToday's ABIToday's TBIPrevious ABIPrevious TBI  +-------+-----------+-----------+------------+------------+  Right 1.05       0.71       1.05        0.72          +-------+-----------+-----------+------------+------------+  Left  1.04       0.70       1.00        0.79          +-------+-----------+-----------+------------+------------+   Right lower extremity arterial duplex: Patent stent without evidence of stenosis.   Debby SAILOR. Magda, MD [facility] Phone Number: [phone] 03/04/2024 4:14 PM  Total time spent on preparing this encounter including chart review, data review, collecting history, examining the patient, coordinating care for this established patient, 30 minutes.   Portions of this report may have been transcribed  using voice recognition software.  Every effort has been made to ensure accuracy; however, inadvertent computerized transcription errors may still be present.

## 2024-03-05 ENCOUNTER — Telehealth: Payer: Self-pay

## 2024-03-05 ENCOUNTER — Ambulatory Visit: Admitting: Vascular Surgery

## 2024-03-05 NOTE — Telephone Encounter (Signed)
 Attempted to call for surgery scheduling. LVM

## 2024-03-08 ENCOUNTER — Other Ambulatory Visit: Payer: Self-pay

## 2024-03-08 DIAGNOSIS — I739 Peripheral vascular disease, unspecified: Secondary | ICD-10-CM

## 2024-03-10 ENCOUNTER — Other Ambulatory Visit: Payer: Self-pay | Admitting: Sports Medicine

## 2024-03-12 DIAGNOSIS — N400 Enlarged prostate without lower urinary tract symptoms: Secondary | ICD-10-CM | POA: Diagnosis not present

## 2024-03-12 DIAGNOSIS — I10 Essential (primary) hypertension: Secondary | ICD-10-CM | POA: Diagnosis not present

## 2024-03-12 DIAGNOSIS — N1832 Chronic kidney disease, stage 3b: Secondary | ICD-10-CM | POA: Diagnosis not present

## 2024-03-12 DIAGNOSIS — N281 Cyst of kidney, acquired: Secondary | ICD-10-CM | POA: Diagnosis not present

## 2024-03-12 DIAGNOSIS — D631 Anemia in chronic kidney disease: Secondary | ICD-10-CM | POA: Diagnosis not present

## 2024-03-12 DIAGNOSIS — K760 Fatty (change of) liver, not elsewhere classified: Secondary | ICD-10-CM | POA: Diagnosis not present

## 2024-03-12 DIAGNOSIS — R809 Proteinuria, unspecified: Secondary | ICD-10-CM | POA: Diagnosis not present

## 2024-03-12 DIAGNOSIS — E1122 Type 2 diabetes mellitus with diabetic chronic kidney disease: Secondary | ICD-10-CM | POA: Diagnosis not present

## 2024-03-12 DIAGNOSIS — N16 Renal tubulo-interstitial disorders in diseases classified elsewhere: Secondary | ICD-10-CM | POA: Diagnosis not present

## 2024-03-12 DIAGNOSIS — E785 Hyperlipidemia, unspecified: Secondary | ICD-10-CM | POA: Diagnosis not present

## 2024-03-12 NOTE — Progress Notes (Signed)
 Follow Up Visit   Patient Name: Michael Thomas, male   Patient DOB: [birth year] Date of Service: 03/12/2024  Patient MRN: [id number] Provider Creating Note: Pinkey Edman, MD  [id number] Primary Care Physician:   [address] [place] [address] Additional Physicians/ Providers:    History of Present Illness Michael Thomas is a 78 y.o. male now comes to the office for follow-up.  He has a past medical history of diabetes, hypertension, hyperlipidemia, history of pyelonephritis, fatty liver and chronic kidney disease stage IIIb with anemia and proteinuria now comes for renal follow-up.    He had lower GI bleed and was found to have a hemoglobin of 6.8.  He had blood transfusion done at [facility].  He also had GI workup done while in the Hospital.  The colonoscopy and endoscopy were negative.  He had a small bowel series which was inconclusive.  His most recent hemoglobin was 11.6.  He has been taking iron  tablets 2 times a day.   Patient has severe peripheral vascular disease.  Has been seeing vascular surgery and was diagnosed with intermittent claudication and atherosclerosis of the native arteries and had stents placed.  He is scheduled for more vascular studies next week.  He had a renal sonogram which showed Prominent fluid-filled extrarenal pelvis on the left. No  caliectasis.  He has been much more careful with his diet. He has been on ACE inhibitor's and has been tolerating well. He has been on Sitagliptin , glipizide  and metformin  and has been tolerating well. His most recent creatinine was 1.7 with a GFR of 39 cc/min. Denies any use of NSAIDS.  Denies any chest pain, SOB or DOE. Denies any history of fever, chills or head aches.  Medications   Current Outpatient Medications:  .  aspirin  (ST JOSEPH) 81 MG EC tablet, Take 81 mg by mouth 1 (one) time each day, Disp: , Rfl:  .  clobetasol  (TEMOVATE ) 0.05 % ointment, Apply 1 Application topically 2 (two) times a day if  needed, Disp: , Rfl:  .  cyanocobalamin  (VITAMIN B-12) 1000 MCG tablet, Take 1,000 mcg by mouth 1 (one) time each day, Disp: , Rfl:  .  fenofibrate  (TRIGLIDE ) 160 MG tablet, Take 1 tablet by mouth 1 (one) time each day, Disp: , Rfl:  .  fluticasone (FLONASE) 50 MCG/ACT nasal spray, Administer 1 spray into affected nostril(s) 1 (one) time each day if needed for allergies or rhinitis, Disp: , Rfl:  .  gabapentin  (NEURONTIN ) 300 MG capsule, Take 300 mg by mouth every night, Disp: , Rfl:  .  glipiZIDE  (GLUCOTROL  XL) 10 MG 24 hr tablet, Take 10 mg by mouth 1 (one) time each day with breakfast, Disp: , Rfl:  .  lisinopril -hydroCHLOROthiazide  (PRINZIDE ,ZESTORETIC ) 20-25 MG per tablet, Take 1 tablet by mouth 1 (one) time each day, Disp: , Rfl:  .  metFORMIN  (GLUCOPHAGE ) 1000 MG tablet, Take 1,000 mg by mouth in the morning and 1,000 mg in the evening. Take with meals., Disp: , Rfl:  .  omega-3 acid ethyl esters (LOVAZA ) 1 g capsule, Take 2 g by mouth in the morning and 2 g in the evening., Disp: , Rfl:  .  simvastatin  (ZOCOR ) 40 MG tablet, Take 40 mg by mouth every night, Disp: , Rfl:  .  SITagliptin  (JANUVIA ) 100 MG tablet, Take 100 mg by mouth 1 (one) time each day, Disp: , Rfl:  .  solifenacin (VESICARE) 5 MG tablet, Take 5 mg by mouth 1 (one) time each day,  Disp: , Rfl:  .  tamsulosin  (FLOMAX ) 0.4 MG 24 hr capsule, Take 0.4 mg by mouth in the morning and 0.4 mg in the evening., Disp: , Rfl:    Allergies Amoxicillin   Problem List Patient Active Problem List  Diagnosis  . Anemia in chronic kidney disease  . Proteinuria, not otherwise specified  . Stage 3b chronic kidney disease (HCC)  . Type 2 diabetes mellitus with diabetic chronic kidney disease (HCC)  . Hypertension  . Benign prostatic hyperplasia  . Hyperlipidemia, not otherwise specified  . Aquired multiple cysts of kidney  . Steatotic liver disease  . Pyelonephritis     Review of Systems  Constitutional: Negative.   HENT:  Negative.    Eyes: Negative.   Respiratory: Negative.    Cardiovascular: Negative.   Gastrointestinal: Negative.   Genitourinary: Negative.   Musculoskeletal: Negative.   Skin: Negative.      History Past Medical History:  Diagnosis Date  . Benign essential hypertension   . Benign prostatic hyperplasia    with urinary obstruction  . Carpal tunnel syndrome   . Chronic kidney disease   . Diabetes mellitus without mention of complication, type II or unspecified type, not stated as uncontrolled (HCC)   . Increased frequency of urination   . Laryngopharyngeal reflux   . Obstructive sleep apnea   . Other and unspecified hyperlipidemia   . Perennial allergic rhinitis   . Peripheral vascular disease (HCC)   . Personal history of kidney stones   . Pyelonephritis    left kidney  . Rash   . Trigger thumb    right thumb  . Vitamin D deficiency     Past Surgical History:  Procedure Laterality Date  . NECK SURGERY    . VASECTOMY     Family History  Problem Relation Age of Onset  . Stroke Mother   . Diabetes Mother   . Hypertension Mother   . Heart disease Brother   . Heart disease Brother   . Heart disease Brother   . Heart disease Brother   . Heart disease Brother    Social History   Tobacco Use  . Smoking status: Former    Types: Cigarettes  . Smokeless tobacco: Never  Substance Use Topics  . Alcohol use: Yes    Comment: occassionally        Physical Exam  Vitals BP 118/70 (BP Location: Left upper arm, Patient Position: Sitting)   Pulse 60   Temp 98.2 F   Wt 137 lb (62.1 kg)   SpO2 98%   BMI 25.89 kg/m   PHYSICAL EXAM: General appearance: well developed, well nourished, NAD Neck: Trachea midline; supple Lungs: CTAB, with normal respiratory effort  CV: S1S2, no murmurs or rubs. Abdomen: Soft, non-tender; bowel sounds present Extremities: No peripheral edema   Laboratory Studies    Comprehensive metabolic panel with GFR  Component 02/05/24  <redacted file path> 08/17/23 <redacted file path> 08/08/23 <redacted file path> 08/07/23 <redacted file path> 08/06/23 <redacted file path> 08/05/23 <redacted file path>  Glucose 179 High  215 <redacted file path> High  -- -- -- --  BUN 50 High  49 <redacted file path> High  27 <redacted file path> High  27 <redacted file path> High  36 <redacted file path> High  46 <redacted file path> High   Creatinine, Ser 1.74 High  1.75 <redacted file path> High  1.59 <redacted file path> High  1.43 <redacted file path> High  1.64 <redacted file path> High  1.73 <redacted file path> High   eGFR 39 Low  39 <redacted file path> Low  -- -- -- --  BUN/Creatinine Ratio 29 High  28 <redacted file path> High  -- -- -- --  Sodium 136 135 <redacted file path> 135 <redacted file path> 137 <redacted file path> 137 <redacted file path> 137 <redacted file path>  Potassium 5.2 5.4 <redacted file path> High  4.3 <redacted file path> 4.3 <redacted file path> 4.2 <redacted file path> 4.2 <redacted file path>  Chloride 105 103 <redacted file path> 107 <redacted file path> 108 <redacted file path> 110 <redacted file path> 111 <redacted file path>  CO2 15 Low  19 <redacted file path> Low  21 <redacted file path> Low  22 <redacted file path> 19 <redacted file path> Low  21 <redacted file path> Low   Calcium 10.1 9.4 <redacted file path> 8.8 <redacted file path> Low  8.7 <redacted file path> Low  8.5 <redacted file path> Low  8.6 <redacted file path> Low   Total Protein 6.9 6.4 <redacted file path> 6.0 <redacted file path> Low  6.5 <redacted file path> 5.7 <redacted file path> Low  --  Albumin  4.2 4.1 <redacted file path> 3.3 <redacted file path> Low  3.5 <redacted file path> 3.0 <redacted file path> Low       CBC Component 02/05/24 <redacted file path> 08/17/23 <redacted file path> 08/08/23 <redacted file path> 08/07/23 <redacted file path> 08/06/23 <redacted file path> 08/05/23 <redacted file path>  WBC 5.8 4.8 <redacted file  path> 5.4 <redacted file path> 5.4 <redacted file path> 6.2 <redacted file path> 5.0 <redacted file path>  RBC 4.10 Low  2.74 <redacted file path> Low Panic  2.77 <redacted file path> Low  2.96 <redacted file path> Low  2.65 <redacted file path> Low  2.46 <redacted file path> Low   Hemoglobin 11.6 Low  7.5 <redacted file path> Low  7.7 <redacted file path> Low  8.2 <redacted file path> Low  7.3 <redacted file path> Low  7.0 <redacted file path> Low   Hematocrit 36.2 Low  23.9 <redacted file path> Low  -- -- -- --  MCV 88 87 <redacted file path> 86.3 <redacted file path> 84.8 <redacted file path> 84.5 <redacted file path> 85.4 <redacted file path>  MCH 28.3 27.4 <redacted file path> 27.8 <redacted file path> 27.7 <redacted file path> 27.5 <redacted file path> 28.5 <redacted file path>  MCHC 32.0 31.4 <redacted file path> Low  32.2 <redacted file path> 32.7 <redacted file path> 32.6 <redacted file path> 33.3 <redacted file path>  RDW 14.1 15.4 <redacted file path> 14.9 <redacted file path> 15.3 <redacted file path> 15.6 <redacted file path> High  15.3 <redacted file path>  Platelets 322 441 <redacted file path> 529 <redacted file path> High  505 <redacted file path> High  472 <redacted file path> High  463 <redacted file path> High     Urine  Lab Units 10/30/23 1114 10/26/22 1040 10/04/22 1200 10/04/22 1108  COLOR U  YELLOW YELLOW YELLOW  --   COLOR UA   --   --   --  Yellow  CLARITY UA   --   --   --  Clear  KETONES U MG/DL  NEGATIVE NEGATIVE NEGATIVE  --   KETONES UA   --   --   --  Negative  PH UA   --   --   --  5.5  UROBILINOGEN UA   --   --   --  0.2  PROT/CREAT RATIO UR mg/g creat  0.533*  533* 0.402*  402* 0.520*  520*  --     Imaging and Other Studies    Problem List Items Addressed This Visit     Anemia in chronic kidney disease - Primary (Chronic)   Proteinuria, not otherwise specified   Stage 3b chronic kidney disease (HCC)   Type 2 diabetes mellitus with diabetic  chronic kidney disease (HCC) (Chronic)   Hypertension   Benign prostatic hyperplasia   Hyperlipidemia, not otherwise specified   Aquired multiple cysts of kidney   Steatotic liver disease   Pyelonephritis   Orders Placed This Encounter  . CBC and Differential  . Protein, Total, Random Urine w/Creatinine (Protein/Creat Ratio)  . PTH, Intact  . Renal Function Panel  . Uric Acid  . Urinalysis        Impression/Recommendations   Michael Thomas is a 78 y.o. male with past medical history of diabetes, peripheral arterial disease, hypertension, hyperlipidemia, history of pyelonephritis, fatty liver and chronic kidney disease stage IIIb with anemia and proteinuria now comes for renal follow-up.     #1: Chronic kidney disease: Patient has CKD stage IIIb with a creatinine of 1.7.  He also has proteinuria and anemia.  He is classified into stage G3/A1 which is most likely secondary to chronic diabetic kidney disease.  We may need to decrease the dose of ACE inhibitors if the renal indices are continuing to get worse.  Renal sonogram showed some perinephric fullness but no hydronephrosis.     #2: Proteinuria: Proteinuria is most likely secondary to chronic kidney disease due to diabetes.Will continue the ACE inhibitor for long-term cardiorenal protection.   #3: Anemia: Status post GI bleed and transfusion.  He is now on oral iron  supplementation.  He was advised to increase the tablets to 3 times a day.   #4: Secondary hyperparathyroidism: His PTH, calcium and phosphorus levels are normal..   #5: Diabetes: Will continue the metformin  for now.  If the creatinine increases further with a GFR declines we need to stop the metformin  and switch to alternative medications.   #6: Hypertension: Patient is advised on importance of 2 g salt restricted diet.  Will continue the ACE inhibitor's for long-term cardiorenal protection but the dose may need to be adjusted if the creatinine continues to get  worse.  #7: Peripheral vascular disease: Patient is being followed by vascular on regular basis.  He scheduled for vascular studies next week.  Advised him regarding the use of IV contrast with the vascular studies.  Typically the vascular studies require least amount of intravenous contrast.     Dietary advice given for stage III CKD. He is advised to avoid nonsteroidal anti-inflammatory drugs. Patient had multiple questions and answered all of them to his satisfaction.  Also discussed with his wife at bedside.   Dear Dr. Curtis, Thank you for the opportunity to participate in the care of this very pleasant patient.   I discussed the assessment and treatment plan with the patient.  The patient was provided an opportunity to ask questions and all were answered.  The patient agreed with the plan and demonstrated an understanding of the instructions. The patient was advised to call back or seek an in-person evaluation if the symptoms worsen or if the condition fails to improve as anticipated.  Return in about 3 months (around 06/12/2024).  Disclaimer: Much of the narrative of this dictation was acquired using speech recognition software. It is possible that some dictated speech was not transcribed accurately  by this system nor detected in the proofing. Such errors are at times unavoidable.    Pinkey Edman, MD [facility] Ph: [phone] Fax: [phone] 03/12/2024

## 2024-03-15 ENCOUNTER — Encounter (HOSPITAL_COMMUNITY)
Admission: RE | Disposition: A | Discharge status: Home / Self Care | Payer: Self-pay | Source: Home / Self Care | Attending: Vascular Surgery

## 2024-03-15 ENCOUNTER — Ambulatory Visit (HOSPITAL_COMMUNITY)
Admission: RE | Admit: 2024-03-15 | Discharge: 2024-03-15 | Disposition: A | Discharge status: Home / Self Care | Attending: Vascular Surgery | Admitting: Vascular Surgery

## 2024-03-15 ENCOUNTER — Other Ambulatory Visit: Payer: Self-pay

## 2024-03-15 DIAGNOSIS — Z79899 Other long term (current) drug therapy: Secondary | ICD-10-CM | POA: Diagnosis not present

## 2024-03-15 DIAGNOSIS — Z9889 Other specified postprocedural states: Secondary | ICD-10-CM

## 2024-03-15 DIAGNOSIS — Z7982 Long term (current) use of aspirin: Secondary | ICD-10-CM | POA: Diagnosis not present

## 2024-03-15 DIAGNOSIS — I129 Hypertensive chronic kidney disease with stage 1 through stage 4 chronic kidney disease, or unspecified chronic kidney disease: Secondary | ICD-10-CM | POA: Diagnosis not present

## 2024-03-15 DIAGNOSIS — Z95820 Peripheral vascular angioplasty status with implants and grafts: Secondary | ICD-10-CM

## 2024-03-15 DIAGNOSIS — E1122 Type 2 diabetes mellitus with diabetic chronic kidney disease: Secondary | ICD-10-CM | POA: Insufficient documentation

## 2024-03-15 DIAGNOSIS — E1151 Type 2 diabetes mellitus with diabetic peripheral angiopathy without gangrene: Secondary | ICD-10-CM | POA: Insufficient documentation

## 2024-03-15 DIAGNOSIS — Y832 Surgical operation with anastomosis, bypass or graft as the cause of abnormal reaction of the patient, or of later complication, without mention of misadventure at the time of the procedure: Secondary | ICD-10-CM | POA: Diagnosis not present

## 2024-03-15 DIAGNOSIS — T82856A Stenosis of peripheral vascular stent, initial encounter: Secondary | ICD-10-CM | POA: Insufficient documentation

## 2024-03-15 DIAGNOSIS — Z7902 Long term (current) use of antithrombotics/antiplatelets: Secondary | ICD-10-CM | POA: Diagnosis not present

## 2024-03-15 DIAGNOSIS — N189 Chronic kidney disease, unspecified: Secondary | ICD-10-CM | POA: Diagnosis not present

## 2024-03-15 DIAGNOSIS — I70213 Atherosclerosis of native arteries of extremities with intermittent claudication, bilateral legs: Secondary | ICD-10-CM

## 2024-03-15 DIAGNOSIS — I739 Peripheral vascular disease, unspecified: Secondary | ICD-10-CM

## 2024-03-15 DIAGNOSIS — I70212 Atherosclerosis of native arteries of extremities with intermittent claudication, left leg: Secondary | ICD-10-CM

## 2024-03-15 HISTORY — PX: LOWER EXTREMITY ANGIOGRAPHY: CATH118251

## 2024-03-15 HISTORY — PX: LOWER EXTREMITY INTERVENTION: CATH118252

## 2024-03-15 HISTORY — PX: ABDOMINAL AORTOGRAM: CATH118222

## 2024-03-15 LAB — POCT ACTIVATED CLOTTING TIME
Activated Clotting Time: 205 s
Activated Clotting Time: 187 s
Activated Clotting Time: 170 s

## 2024-03-15 LAB — GLUCOSE, CAPILLARY: Glucose-Capillary: 163 mg/dL — ABNORMAL HIGH (ref 70–99)

## 2024-03-15 SURGERY — ABDOMINAL AORTOGRAM
Anesthesia: LOCAL

## 2024-03-15 MED ORDER — SODIUM CHLORIDE 0.9 % IV SOLN: INTRAVENOUS | Status: DC

## 2024-03-15 MED ORDER — HEPARIN SODIUM (PORCINE) 1000 UNIT/ML IJ SOLN
INTRAMUSCULAR | Status: AC
Start: 2024-03-15 — End: 2024-03-15
  Filled 2024-03-15: qty 10

## 2024-03-15 MED ORDER — ASPIRIN 81 MG PO CHEW
CHEWABLE_TABLET | ORAL | Status: DC | PRN
  Administered 2024-03-15: 81 mg via ORAL

## 2024-03-15 MED ORDER — FENTANYL CITRATE (PF) 100 MCG/2ML IJ SOLN
INTRAMUSCULAR | Status: AC
Start: 2024-03-15 — End: 2024-03-15
  Filled 2024-03-15: qty 2

## 2024-03-15 MED ORDER — SODIUM CHLORIDE 0.9 % IV SOLN: 250.0000 mL | INTRAVENOUS | Status: DC | PRN

## 2024-03-15 MED ORDER — LABETALOL HCL 5 MG/ML IV SOLN: 10.0000 mg | INTRAVENOUS | Status: DC | PRN

## 2024-03-15 MED ORDER — LIDOCAINE HCL (PF) 1 % IJ SOLN
INTRAMUSCULAR | Status: DC | PRN
  Administered 2024-03-15: 15 mL

## 2024-03-15 MED ORDER — FENTANYL CITRATE (PF) 100 MCG/2ML IJ SOLN
INTRAMUSCULAR | Status: DC | PRN
  Administered 2024-03-15: 50 ug via INTRAVENOUS

## 2024-03-15 MED ORDER — CLOPIDOGREL BISULFATE 75 MG PO TABS
ORAL_TABLET | ORAL | Status: DC | PRN
Start: 2024-03-15 — End: 2024-03-15
  Administered 2024-03-15: 75 mg via ORAL

## 2024-03-15 MED ORDER — SODIUM CHLORIDE 0.9% FLUSH: 3.0000 mL | Freq: Two times a day (BID) | INTRAVENOUS | Status: DC

## 2024-03-15 MED ORDER — SODIUM CHLORIDE 0.9% FLUSH: 3.0000 mL | INTRAVENOUS | Status: DC | PRN

## 2024-03-15 MED ORDER — ACETAMINOPHEN 325 MG PO TABS: 650.0000 mg | ORAL_TABLET | ORAL | Status: DC | PRN

## 2024-03-15 MED ORDER — MIDAZOLAM HCL 2 MG/2ML IJ SOLN
INTRAMUSCULAR | Status: AC
  Filled 2024-03-15: qty 2

## 2024-03-15 MED ORDER — ONDANSETRON HCL 4 MG/2ML IJ SOLN: 4.0000 mg | Freq: Four times a day (QID) | INTRAMUSCULAR | Status: DC | PRN

## 2024-03-15 MED ORDER — HEPARIN (PORCINE) IN NACL 2000-0.9 UNIT/L-% IV SOLN
INTRAVENOUS | Status: DC | PRN
  Administered 2024-03-15: 1000 mL

## 2024-03-15 MED ORDER — CLOPIDOGREL BISULFATE 75 MG PO TABS
ORAL_TABLET | ORAL | Status: AC
Start: 2024-03-15 — End: 2024-03-15
  Filled 2024-03-15: qty 1

## 2024-03-15 MED ORDER — HEPARIN SODIUM (PORCINE) 1000 UNIT/ML IJ SOLN
INTRAMUSCULAR | Status: DC | PRN
  Administered 2024-03-15: 6000 [IU] via INTRAVENOUS

## 2024-03-15 MED ORDER — MIDAZOLAM HCL 2 MG/2ML IJ SOLN
INTRAMUSCULAR | Status: DC | PRN
  Administered 2024-03-15: 1 mg via INTRAVENOUS

## 2024-03-15 MED ORDER — IODIXANOL 320 MG/ML IV SOLN
INTRAVENOUS | Status: DC | PRN
  Administered 2024-03-15: 40 mL

## 2024-03-15 MED ORDER — SODIUM CHLORIDE 0.9 % WEIGHT BASED INFUSION: 1.0000 mL/kg/h | INTRAVENOUS | Status: DC

## 2024-03-15 MED ORDER — ASPIRIN 81 MG PO CHEW
CHEWABLE_TABLET | ORAL | Status: AC
  Filled 2024-03-15: qty 1

## 2024-03-15 MED ORDER — LIDOCAINE HCL (PF) 1 % IJ SOLN
INTRAMUSCULAR | Status: AC
  Filled 2024-03-15: qty 30

## 2024-03-15 MED ORDER — HYDRALAZINE HCL 20 MG/ML IJ SOLN: 5.0000 mg | INTRAMUSCULAR | Status: DC | PRN

## 2024-03-15 SURGICAL SUPPLY — 13 items
BALLOON ADMIRAL INPACT 5X250 (BALLOONS) IMPLANT
CATH OMNI FLUSH 5F 65CM (CATHETERS) IMPLANT
GLIDEWIRE ADV .035X260CM (WIRE) IMPLANT
KIT ANGIASSIST CO2 SYSTEM (KITS) IMPLANT
KIT ENCORE 26 ADVANTAGE (KITS) IMPLANT
KIT MICROPUNCTURE NIT STIFF (SHEATH) IMPLANT
KIT SINGLE USE MANIFOLD (KITS) IMPLANT
SET ATX-X65L (MISCELLANEOUS) IMPLANT
SHEATH CATAPULT 6FR 45 (SHEATH) IMPLANT
SHEATH PINNACLE 5F 10CM (SHEATH) IMPLANT
SHEATH PINNACLE 6F 10CM (SHEATH) IMPLANT
TRAY PV CATH (CUSTOM PROCEDURE TRAY) ×1 IMPLANT
WIRE BENTSON .035X145CM (WIRE) IMPLANT

## 2024-03-15 NOTE — Discharge Instructions (Signed)
 Femoral Site Care This sheet gives you information about how to care for yourself after your procedure. Your health care provider may also give you more specific instructions. If you have problems or questions, contact your health care provider. What can I expect after the procedure?  After the procedure, it is common to have: Bruising that usually fades within 1-2 weeks. Tenderness at the site. Follow these instructions at home: Wound care Follow instructions from your health care provider about how to take care of your insertion site. Make sure you: Wash your hands with soap and water before you change your bandage (dressing). If soap and water are not available, use hand sanitizer. Remove your dressing as told by your health care provider. In 24 hours Do not take baths, swim, or use a hot tub until your health care provider approves. You may shower 24-48 hours after the procedure or as told by your health care provider. Gently wash the site with plain soap and water. Pat the area dry with a clean towel. Do not rub the site. This may cause bleeding. Do not apply powder or lotion to the site. Keep the site clean and dry. Check your femoral site every day for signs of infection. Check for: Redness, swelling, or pain. Fluid or blood. Warmth. Pus or a bad smell. Activity For the first 2-3 days after your procedure, or as long as directed: Avoid climbing stairs as much as possible. Do not squat. Do not lift anything that is heavier than 10 lb (4.5 kg), or the limit that you are told, until your health care provider says that it is safe. For 5 days Rest as directed. Avoid sitting for a long time without moving. Get up to take short walks every 1-2 hours. Do not drive for 24 hours if you were given a medicine to help you relax (sedative). General instructions Take over-the-counter and prescription medicines only as told by your health care provider. Keep all follow-up visits as told by  your health care provider. This is important. Contact a health care provider if you have: A fever or chills. You have redness, swelling, or pain around your insertion site. Get help right away if: The catheter insertion area swells very fast. You pass out. You suddenly start to sweat or your skin gets clammy. The catheter insertion area is bleeding, and the bleeding does not stop when you hold steady pressure on the area. The area near or just beyond the catheter insertion site becomes pale, cool, tingly, or numb. These symptoms may represent a serious problem that is an emergency. Do not wait to see if the symptoms will go away. Get medical help right away. Call your local emergency services (911 in the [place] Do not drive yourself to the hospital. Summary After the procedure, it is common to have bruising that usually fades within 1-2 weeks. Check your femoral site every day for signs of infection. Do not lift anything that is heavier than 10 lb (4.5 kg), or the limit that you are told, until your health care provider says that it is safe. This information is not intended to replace advice given to you by your health care provider. Make sure you discuss any questions you have with your health care provider. Document Revised: 08/07/2017 Document Reviewed: 08/07/2017 Elsevier Patient Education  2020 [employer].

## 2024-03-15 NOTE — Procedures (Shared)
 Site area: right femoral 110F arterial sheath Site Prior to Removal:  Level 0 Pressure Applied For: 30 minutes Manual:   yes Patient Status During Pull:  stable Post Pull Site:  Level 0 Post Pull Instructions Given:  yes Post Pull Pulses Present: right dp dopplered Dressing Applied:  gauze and tegaderm Bedrest begins @ 1145 Comments:

## 2024-03-15 NOTE — H&P (Signed)
 See below for H&P details. LLE claudication has returned. Plan angiogram today.  Debby SAILOR. Magda, MD FACS [facility] Phone Number: [phone] 03/15/2024 9:54 AM    VASC[facility] VEIN SPECIALISTS OF   ASSESSMENT / PLAN: Michael Thomas is a 78 y.o. male status post right femoral endarterectomy and femoral-popliteal angioplasty and stenting.  Recommend the following which can slow the progression of atherosclerosis and reduce the risk of major adverse cardiac / limb events:  Complete cessation from all tobacco products. Blood glucose control with goal A1c < 7%. Blood pressure control with goal blood pressure < 140/90 mmHg. Lipid reduction therapy with goal LDL-C <100 mg/dL (<29 if symptomatic from PAD).  Aspirin  81mg  PO QD.  Clopidogrel  75mg  by mouth daily Atorvastatin 40-80mg  PO QD (or other high intensity statin therapy).  Patient reports no symptoms in the right lower extremity.  He does have a early claudication symptoms in the left leg.  We will monitor this closely.  Continue dual antiplatelet therapy for now.  CHIEF COMPLAINT: Claudication  HISTORY OF PRESENT ILLNESS: Michael Thomas is a 78 y.o. male referred from Dr. Alona for discussion of surgical options for intermittent claudication in bilateral lower extremities.  The patient is undergone multiple endovascular interventions for lifestyle limiting claudication by Dr. Alona.  He reports good symptom relief in the short-term after these interventions, but gradual return to baseline level of discomfort afterwards.  He has a good amount of exercise tolerance from my perspective.  He reports he can walk on a flat surface for greater than a mile.  He has difficulty on incline surfaces.  He has difficulty jogging.  04/25/23: Patient returns to clinic for evaluation.  He describes fairly classic claudication symptoms in his right calf which are bothersome to him.   These began at about 50 yards.  This is interfering with his lifestyle.  He strongly desires intervention.  05/16/23: Patient returns to clinic to review CT angiogram. Unfortunately, his renal function did not permit this test, so we obtained duplex ultrasound of the aorta, iliacs, and RLE. We reviewed these and developed a plan to do angiography next.  07/04/23: Patient returns to clinic.  He initially felt better after surgery, but now reports claudication symptoms are worse.  His incision has healed appropriately.  11/07/23: Patient returns to clinic for surveillance.  He is doing very well overall with his right lower extremity.  He does report some claudication symptoms in the left lower extremity while walking on a treadmill.  He continues to have good exercise tolerance, but as before is a bit frustrated by his claudication symptoms.  He is not yet ready to pursue intervention.  He has low level of faith in the noninvasive testing as it has been erroneous in him before.  Past Medical History:  Diagnosis Date   Benign prostatic hyperplasia with urinary obstruction 03/25/2015   Chronic kidney disease    Diabetes mellitus without complication (HCC)    History of kidney stones    Hyperlipidemia    Hypertension    OSA (obstructive sleep apnea)    Peripheral arterial disease (HCC)    Vitamin D deficiency     Past Surgical History:  Procedure Laterality Date   ABDOMINAL AORTOGRAM W/LOWER EXTREMITY N/A 05/26/2023   Procedure: ABDOMINAL AORTOGRAM W/LOWER EXTREMITY;  Surgeon: Magda Debby SAILOR, MD;  [facility]: [facility];  Service: Cardiovascular;  Laterality: N/A;   ABDOMINAL AORTOGRAM W/LOWER EXTREMITY N/A 07/21/2023  Procedure: ABDOMINAL AORTOGRAM W/LOWER EXTREMITY;  Surgeon: Magda Debby SAILOR, MD;  Location: [facility];  Service: Cardiovascular;  Laterality: N/A;   BIOPSY  08/07/2023   Procedure: BIOPSY;  Surgeon: Maryruth Ole DASEN, MD;  Location: [facility] END[facility];  Service:  Endoscopy;;   COLONOSCOPY     COLONOSCOPY WITH PROPOFOL  N/A 08/07/2023   Procedure: COLONOSCOPY WITH PROPOFOL ;  Surgeon: Maryruth Ole DASEN, MD;  Location: [facility] ENDOSCOPY;  Service: Endoscopy;  Laterality: N/A;   DG ARTHRO THUMB*L*     ENDARTERECTOMY FEMORAL Right 06/15/2023   Procedure: RIGHT COMMON FEMORAL ENDARTERECTOMY;  Surgeon: Magda Debby SAILOR, MD;  Location: [facility] OR;  Service: Vascular;  Laterality: Right;   ESOPHAGOGASTRODUODENOSCOPY (EGD) WITH PROPOFOL  N/A 08/07/2023   Procedure: ESOPHAGOGASTRODUODENOSCOPY (EGD) WITH PROPOFOL ;  Surgeon: Maryruth Ole DASEN, MD;  Location: [facility] ENDOSCOPY;  Service: Endoscopy;  Laterality: N/A;   INTRAOPERATIVE ARTERIOGRAM Right 06/15/2023   Procedure: INTRA OPERATIVE RIGHT LOWER EXTREMITY  ARTERIOGRAM;  Surgeon: Magda Debby SAILOR, MD;  Location: [facility];  Service: Vascular;  Laterality: Right;   IR ANGIOGRAM EXTREMITY BILATERAL  02/20/2018   IR ANGIOGRAM EXTREMITY BILATERAL  04/05/2019   IR ANGIOGRAM EXTREMITY LEFT  05/07/2020   IR ANGIOGRAM EXTREMITY LEFT  10/13/2022   IR ANGIOGRAM EXTREMITY RIGHT  02/15/2021   IR ANGIOGRAM EXTREMITY RIGHT  12/14/2021   IR ANGIOGRAM SELECTIVE EACH ADDITIONAL VESSEL  02/20/2018   IR ANGIOGRAM SELECTIVE EACH ADDITIONAL VESSEL  10/13/2022   IR FEM POP ART ATHERECT INC PTA MOD SED  02/20/2018   IR FEM POP ART ATHERECT INC PTA MOD SED  05/14/2019   IR FEM POP ART ATHERECT INC PTA MOD SED  05/07/2020   IR FEM POP ART ATHERECT INC PTA MOD SED  02/15/2021   IR FEM POP ART STENT INC PTA MOD SED  12/14/2021   IR ILIAC ART STENT INC PTA MOD SED  10/13/2022   IR INTRAVASCULAR ULTRASOUND NON CORONARY  02/15/2021   IR INTRAVASCULAR ULTRASOUND NON CORONARY  12/14/2021   IR INTRAVASCULAR ULTRASOUND NON CORONARY  10/13/2022   IR RADIOLOGIST EVAL & MGMT  02/01/2018   IR RADIOLOGIST EVAL & MGMT  03/20/2018   IR RADIOLOGIST EVAL & MGMT  07/25/2018   IR RADIOLOGIST EVAL & MGMT  03/07/2019   IR RADIOLOGIST EVAL & MGMT  04/23/2019    IR RADIOLOGIST EVAL & MGMT  06/18/2019   IR RADIOLOGIST EVAL & MGMT  04/29/2020   IR RADIOLOGIST EVAL & MGMT  06/04/2020   IR RADIOLOGIST EVAL & MGMT  01/20/2021   IR RADIOLOGIST EVAL & MGMT  04/27/2021   IR RADIOLOGIST EVAL & MGMT  08/12/2021   IR RADIOLOGIST EVAL & MGMT  11/02/2021   IR RADIOLOGIST EVAL & MGMT  11/15/2021   IR RADIOLOGIST EVAL & MGMT  01/26/2022   IR RADIOLOGIST EVAL & MGMT  03/23/2022   IR RADIOLOGIST EVAL & MGMT  09/23/2022   IR RADIOLOGIST EVAL & MGMT  11/25/2022   IR RADIOLOGIST EVAL & MGMT  03/22/2023   IR US  GUIDE VASC ACCESS LEFT  04/05/2019   IR US  GUIDE VASC ACCESS LEFT  02/15/2021   IR US  GUIDE VASC ACCESS LEFT  12/14/2021   IR US  GUIDE VASC ACCESS RIGHT  02/20/2018   IR US  GUIDE VASC ACCESS RIGHT  05/14/2019   IR US  GUIDE VASC ACCESS RIGHT  05/07/2020   IR US  GUIDE VASC ACCESS RIGHT  10/13/2022   NECK SURGERY     PATCH ANGIOPLASTY Right 06/15/2023   Procedure: [facility]  ANGIOPLASTY USING [name] LINTS;  Surgeon: Magda Debby SAILOR, MD;  Location: [facility] OR;  Service: Vascular;  Laterality: Right;   PERIPHERAL VASCULAR BALLOON ANGIOPLASTY  07/21/2023   Procedure: PERIPHERAL VASCULAR BALLOON ANGIOPLASTY;  Surgeon: Magda Debby SAILOR, MD;  [facility]: [facility];  Service: Cardiovascular;;   PERIPHERAL VASCULAR INTERVENTION  07/21/2023   Procedure: PERIPHERAL VASCULAR INTERVENTION;  Surgeon: Magda Debby SAILOR, MD;  [facility]: [facility];  Service: Cardiovascular;;   VASECTOMY      Family History  Problem Relation Age of Onset   Stroke Mother    Diabetes Mellitus II Mother    Prostate cancer Neg Hx    Kidney cancer Neg Hx    Bladder Cancer Neg Hx     Social History   Socioeconomic History   Marital status: Married    Spouse name: Not on file   Number of children: Not on file   Years of education: Not on file   Highest education level: Not on file  Occupational History   Not on file  Tobacco Use   Smoking status: Never   Smokeless tobacco:  Never  Vaping Use   Vaping status: Never Used  Substance and Sexual Activity   Alcohol use: No   Drug use: No   Sexual activity: Yes    Birth control/protection: None  Other Topics Concern   Not on file  Social History Narrative   Not on file   Social Drivers of Health   Financial Resource Strain: Low Risk  (09/05/2023)   Received from [facility]   Overall Financial Resource Strain (CARDIA)    Difficulty of Paying Living Expenses: Not hard at all  Food Insecurity: No Food Insecurity (09/05/2023)   Received from [facility]   Hunger Vital Sign    Within the past 12 months, you worried that your food would run out before you got the money to buy more.: Never true    Within the past 12 months, the food you bought just didn't last and you didn't have money to get more.: Never true  Transportation Needs: No Transportation Needs (09/05/2023)   Received from [facility] - Transportation    In the past 12 months, has lack of transportation kept you from medical appointments or from getting medications?: No    Lack of Transportation (Non-Medical): No  Physical Activity: Not on file  Stress: Not on file  Social Connections: Socially Isolated (08/07/2023)   Social Connection and Isolation Panel    Frequency of Communication with Friends and Family: Never    Frequency of Social Gatherings with Friends and Family: Never    Attends Religious Services: Never    Database administrator or Organizations: No    Attends Banker Meetings: Never    Marital Status: Married  Catering manager Violence: Not At Risk (08/05/2023)   Humiliation, Afraid, Rape, and Kick questionnaire    Fear of Current or Ex-Partner: No    Emotionally Abused: No    Physically Abused: No    Sexually Abused: No    Allergies  Allergen Reactions   Amoxicillin  Other (See Comments)    Fatigue and Myalgia and Rigors x2 attempts    Current  Facility-Administered Medications  Medication Dose Route Frequency Provider Last Rate Last Admin   0.9 %  sodium chloride  infusion   Intravenous Continuous Magda Debby SAILOR, MD 100 mL/hr at 03/15/24 0749 New Bag at 03/15/24 [phone]  PHYSICAL EXAM Vitals:   03/15/24 0735  BP: 133/68  Pulse: (!) 51  Resp: 18  Temp: 97.7 F (36.5 C)  TempSrc: Oral  SpO2: 97%  Weight: 59.9 kg  Height: 5' 1 (1.549 m)       Constitutional: well appearing. no distress. Appears well nourished.  Neurologic: CN intact. no focal findings. no sensory loss. Psychiatric:  Mood and affect symmetric and appropriate. Eyes:  No icterus. No conjunctival pallor. Ears, nose, throat:  mucous membranes moist. Midline trachea.  Cardiac: regular rate and rhythm.  Respiratory:  unlabored. Abdominal:  soft, non-tender, non-distended.  Peripheral vascular: 1+ L DP / PT pulse. 2+ R DP / PT pulse Extremity: no edema. no cyanosis. no pallor.  Skin: no gangrene. no ulceration.  Lymphatic: no Stemmer's sign. no palpable lymphadenopathy.  PERTINENT LABORATORY AND RADIOLOGIC DATA  Most recent CBC    Latest Ref Rng & Units 02/05/2024   10:35 AM 08/17/2023   10:22 AM 08/08/2023    4:24 AM  CBC  WBC 3.4 - 10.8 x10E3/uL 5.8  4.8  5.4   Hemoglobin 13.0 - 17.7 g/dL 88.3  7.5  7.7   Hematocrit 37.5 - 51.0 % 36.2  23.9  23.9   Platelets 150 - 450 x10E3/uL 322  441  529      Most recent CMP    Latest Ref Rng & Units 02/05/2024   10:35 AM 08/17/2023   10:22 AM 08/08/2023    4:24 AM  CMP  Glucose 70 - 99 mg/dL 820  784  858   BUN 8 - 27 mg/dL 50  49  27   Creatinine 0.76 - 1.27 mg/dL 8.25  8.24  8.40   Sodium 134 - 144 mmol/L 136  135  135   Potassium 3.5 - 5.2 mmol/L 5.2  5.4  4.3   Chloride 96 - 106 mmol/L 105  103  107   CO2 20 - 29 mmol/L 15  19  21    Calcium 8.6 - 10.2 mg/dL 89.8  9.4  8.8   Total Protein 6.0 - 8.5 g/dL 6.9  6.4  6.0   Total Bilirubin 0.0 - 1.2 mg/dL 0.2  <9.7  0.5   Alkaline Phos 44 - 121 IU/L  29  33  28   AST 0 - 40 IU/L 19  12  13    ALT 0 - 44 IU/L 19  10  11      Renal function CrCl cannot be calculated (Patient's most recent lab result is older than the maximum 21 days allowed.).  HbA1c, POC (controlled diabetic range) (%)  Date Value  02/16/2022 7.7 (A)   Hgb A1c MFr Bld (%)  Date Value  02/05/2024 8.5 (H)    LDL Cholesterol (Calc)  Date Value Ref Range Status  09/06/2022 81 mg/dL (calc) Final    Comment:    Reference range: <100 . Desirable range <100 mg/dL for primary prevention;   <70 mg/dL for patients with CHD or diabetic patients  with > or = 2 CHD risk factors. SABRA LDL-C is now calculated using the Martin-Hopkins  calculation, which is a validated novel method providing  better accuracy than the Friedewald equation in the  estimation of LDL-C.  Gladis APPLETHWAITE et al. SANDREA. 7986;689(80): 2061-2068  (http://education.[employer].com/faq/FAQ164)    LDL Chol Calc (NIH)  Date Value Ref Range Status  02/05/2024 89 0 - 99 mg/dL Final   Direct LDL  Date Value Ref Range Status  07/25/2018 58 <100 mg/dL Final  Comment:    . Desirable range <100 mg/dL for primary prevention;   <70 mg/dL for patients with CHD or diabetic patients  with > or = 2 CHD risk factors. SABRA     +-------+-----------+-----------+------------+------------+  ABI/TBIToday's ABIToday's TBIPrevious ABIPrevious TBI  +-------+-----------+-----------+------------+------------+  Right 1.05       0.71       1.05        0.72          +-------+-----------+-----------+------------+------------+  Left  1.04       0.70       1.00        0.79          +-------+-----------+-----------+------------+------------+   Right lower extremity arterial duplex: Patent stent without evidence of stenosis.   Debby SAILOR. Magda, MD FACS [facility] Phone Number: [phone] 03/15/2024 8:27 AM  Total time spent on preparing this encounter including  chart review, data review, collecting history, examining the patient, coordinating care for this established patient, 30 minutes.   Portions of this report may have been transcribed using voice recognition software.  Every effort has been made to ensure accuracy; however, inadvertent computerized transcription errors may still be present.

## 2024-03-15 NOTE — Op Note (Signed)
 DATE OF SERVICE: 03/15/2024  PATIENT:  Michael Thomas  78 y.o. male  PRE-OPERATIVE DIAGNOSIS:   atherosclerosis of native and stented arteries in left lower extremity causing claudication  POST-OPERATIVE DIAGNOSIS:  Same  PROCEDURE:   1) Ultrasound guided right common femoral access (CPT [phone]) 2) Aortogram (CPT [phone]) 3) Left lower extremity angiogram with second order cannulation (CPT [phone]) 4) Left femoropopliteal drug-coated angioplasty (CPT 37224) 5) Conscious sedation (44 minutes) (CPT 99152)  SURGEON:  Debby SAILOR. Magda, MD  ASSISTANT: none  ANESTHESIA:   local and IV sedation  ESTIMATED BLOOD LOSS: min  LOCAL MEDICATIONS USED:  LIDOCAINE    COUNTS: confirmed correct.  PATIENT DISPOSITION:  PACU - hemodynamically stable.   Delay start of Pharmacological VTE agent (>24hrs) due to surgical blood loss or risk of bleeding: no  INDICATION FOR PROCEDURE: Michael Thomas is a 78 y.o. male with strong personal history of peripheral arterial disease with claudication in the left lower extremity which is very bothersome to him.. After careful discussion of risks, benefits, and alternatives the patient was offered angiography. The patient understood and wished to proceed.  OPERATIVE FINDINGS:   Left renal artery: Patent by CO2 angiogram Right renal artery: Patent by CO2 angiogram  Infrarenal aorta: Patent by CO2 angiogram  Left common iliac artery: Patent by CO2 angiogram Right common iliac artery: Patent by CO2 angiogram  Left internal iliac artery: Patent by CO2 angiogram Right internal iliac artery: Patent by CO2 angiogram  Left external iliac artery: Patent by CO2 angiogram Right external iliac artery: Patent by CO2 angiogram  Left common femoral artery: patent Right common femoral artery: not studied  Left profunda femoris artery: patent Right profunda femoris artery: not studied  Left superficial femoral artery: prior stenting with in-stent stenosis (greatest 70%)  Right superficial femoral artery: not studied  Left popliteal artery: patent Right popliteal artery: not studied  Left anterior tibial artery: occluded proximally; reconstitutes at ankle Right anterior tibial artery: not studied  Left tibioperoneal trunk: patent Right tibioperoneal trunk: not studied  Left peroneal artery: patent Right peroneal artery: not studied  Left posterior tibial artery: occluded proximally; reconstitutes at ankle Right posterior tibial artery: not studied  Left pedal circulation: fills via peroneal and reconstituted AT/PT Right pedal circulation: not studied   Heavily scarred right groin making access and sheath exchange difficult.  DESCRIPTION OF PROCEDURE: After identification of the patient in the pre-operative holding area, the patient was transferred to the operating room. The patient was positioned supine on the operating room table. Anesthesia was induced. The groins was prepped and draped in standard fashion. A surgical pause was performed confirming correct patient, procedure, and operative location.  The right groin was anesthetized with subcutaneous injection of 1% lidocaine . Using ultrasound guidance, the right common femoral artery was accessed with micropuncture technique. Fluoroscopy was used to confirm cannulation over the femoral head. The 41F micropuncture sheath was upsized to 92F.   A Benson wire was advanced into the distal aorta. Over the wire an omni flush catheter was advanced to the level of L2. Aortogram was performed - see above for details.   The left common iliac artery was selected with an omniflush catheter and glidewire advantage guidewire. The wire was advanced into the common femoral artery. Over the wire the omni flush catheter was advanced into the external iliac artery. Selective angiography was performed - see above for details.   The decision was made to intervene. The patient was heparinized with 6000 units of heparin . The  55F sheath was  exchanged for a 20F x 45cm sheath. Selective angiography of the left lower extremity extremity performed prior to intervention.   The lesions were treated with:  Left femoropopliteal drug-coated angioplasty  Completion angiography revealed:  Resolution of in-stent stenosis  The sheath was left in place to be removed in the recovery area.  Conscious sedation was administered with the use of IV fentanyl  and midazolam  under continuous physician and nurse monitoring.  Heart rate, blood pressure, and oxygen saturation were continuously monitored.  Total sedation time was 44 minutes  Upon completion of the case instrument and sharps counts were confirmed correct. The patient was transferred to the PACU in good condition. I was present for all portions of the procedure.  PLAN: ASA / Plavix  / Statin. Heavily scarred right groin making access and sheath exchange difficult - note for future interventions. Follow up in 4 weeks with ABI and LLE arterial duplex.  Debby SAILOR. Magda, MD [facility] Phone Number: [phone] 03/15/2024 9:22 AM

## 2024-03-18 ENCOUNTER — Encounter (HOSPITAL_COMMUNITY): Payer: Self-pay | Admitting: Vascular Surgery

## 2024-04-05 ENCOUNTER — Ambulatory Visit: Admitting: Sports Medicine

## 2024-04-09 ENCOUNTER — Ambulatory Visit: Admitting: Sports Medicine

## 2024-04-09 ENCOUNTER — Other Ambulatory Visit: Payer: Self-pay

## 2024-04-09 ENCOUNTER — Encounter: Payer: Self-pay | Admitting: Sports Medicine

## 2024-04-09 DIAGNOSIS — I10 Essential (primary) hypertension: Secondary | ICD-10-CM

## 2024-04-09 MED ORDER — LISINOPRIL-HYDROCHLOROTHIAZIDE 20-25 MG PO TABS: 0.5000 | ORAL_TABLET | Freq: Every day | ORAL | 1 refills | Status: DC

## 2024-04-12 ENCOUNTER — Other Ambulatory Visit: Payer: Self-pay

## 2024-04-12 DIAGNOSIS — E118 Type 2 diabetes mellitus with unspecified complications: Secondary | ICD-10-CM

## 2024-04-12 MED ORDER — GLIPIZIDE ER 10 MG PO TB24: ORAL_TABLET | ORAL | 3 refills | Status: AC

## 2024-04-22 ENCOUNTER — Other Ambulatory Visit: Payer: Self-pay

## 2024-04-22 DIAGNOSIS — R252 Cramp and spasm: Secondary | ICD-10-CM

## 2024-04-22 MED ORDER — GABAPENTIN 300 MG PO CAPS: 300.0000 mg | ORAL_CAPSULE | Freq: Every day | ORAL | 3 refills | Status: AC

## 2024-04-25 ENCOUNTER — Ambulatory Visit (INDEPENDENT_AMBULATORY_CARE_PROVIDER_SITE_OTHER): Admitting: Student

## 2024-04-25 ENCOUNTER — Encounter: Payer: Self-pay | Admitting: Student

## 2024-04-25 VITALS — BP 120/68 | HR 70 | Ht 61.0 in | Wt 136.2 lb

## 2024-04-25 DIAGNOSIS — I739 Peripheral vascular disease, unspecified: Secondary | ICD-10-CM | POA: Diagnosis not present

## 2024-04-25 DIAGNOSIS — N400 Enlarged prostate without lower urinary tract symptoms: Secondary | ICD-10-CM | POA: Diagnosis not present

## 2024-04-25 DIAGNOSIS — K760 Fatty (change of) liver, not elsewhere classified: Secondary | ICD-10-CM

## 2024-04-25 DIAGNOSIS — I10 Essential (primary) hypertension: Secondary | ICD-10-CM | POA: Diagnosis not present

## 2024-04-25 DIAGNOSIS — D649 Anemia, unspecified: Secondary | ICD-10-CM | POA: Diagnosis not present

## 2024-04-25 DIAGNOSIS — E1122 Type 2 diabetes mellitus with diabetic chronic kidney disease: Secondary | ICD-10-CM | POA: Diagnosis not present

## 2024-04-25 DIAGNOSIS — E785 Hyperlipidemia, unspecified: Secondary | ICD-10-CM | POA: Diagnosis not present

## 2024-04-25 DIAGNOSIS — R252 Cramp and spasm: Secondary | ICD-10-CM

## 2024-04-25 DIAGNOSIS — Z7984 Long term (current) use of oral hypoglycemic drugs: Secondary | ICD-10-CM

## 2024-04-25 DIAGNOSIS — N1832 Chronic kidney disease, stage 3b: Secondary | ICD-10-CM

## 2024-04-25 DIAGNOSIS — G4733 Obstructive sleep apnea (adult) (pediatric): Secondary | ICD-10-CM

## 2024-04-25 MED ORDER — EMPAGLIFLOZIN 10 MG PO TABS: 10.0000 mg | ORAL_TABLET | Freq: Every day | ORAL | 3 refills | Status: DC

## 2024-04-25 MED ORDER — ROSUVASTATIN CALCIUM 20 MG PO TABS: 20.0000 mg | ORAL_TABLET | Freq: Every day | ORAL | 1 refills | Status: AC

## 2024-04-25 MED ORDER — LISINOPRIL-HYDROCHLOROTHIAZIDE 20-25 MG PO TABS: 1.0000 | ORAL_TABLET | Freq: Every day | ORAL | Status: DC

## 2024-04-25 NOTE — Assessment & Plan Note (Addendum)
 This is improving CBC, hbg 11.6 with improved iron  panel.  is currently on DAPT with ASA and Plavix  for PAD. Is taking ferrous sulfate  TID. Decrease iron  to daily/every other day  dosing. CBC at next visit

## 2024-04-25 NOTE — Assessment & Plan Note (Addendum)
 Is currently taking gabapentin  300 mg daily and recent revascularization and feeling better. Continue gabapentin  300 mg daily.

## 2024-04-25 NOTE — Assessment & Plan Note (Deleted)
lisino

## 2024-04-25 NOTE — Progress Notes (Signed)
 New Patient Office Visit  Subjective    Patient ID: Michael Thomas, male    DOB: 06-07-1946  Age: 78 y.o. MRN: [id number]  CC:  Chief Complaint  Patient presents with   Diabetes    HPI Michael Thomas is a 78 year old person who presents with his wife to establish care. Reports blood sugars have been increasing lately but no other acute complaints. Please refer to problem based charting for further details and assessment and plan of current problem and chronic medical conditions.  Outpatient Encounter Medications as of 04/25/2024  Medication Sig   AMBULATORY NON FORMULARY MEDICATION One Touch Ultra 2 glucometer   aspirin  EC 81 MG tablet Take 81 mg by mouth at bedtime.   Blood Glucose Monitoring Suppl (ONETOUCH VERIO REFLECT) w/Device KIT Check fasting blood sugar every morning and 2 hours after largest meal of the day.,   clobetasol  ointment (TEMOVATE ) 0.05 % APPLY TOPICALLY TO AFFECTED AREA(S) TWICE DAILY AS NEEDED   cyanocobalamin  1000 MCG tablet Take 1 tablet (1,000 mcg total) by mouth daily.   empagliflozin  (JARDIANCE ) 10 MG TABS tablet Take 1 tablet (10 mg total) by mouth daily.   fenofibrate  160 MG tablet TAKE 1 TABLET BY MOUTH EVERY DAY   ferrous sulfate  325 (65 FE) MG EC tablet Take 1 tablet (325 mg total) by mouth 3 (three) times daily with meals.   gabapentin  (NEURONTIN ) 300 MG capsule Take 1 capsule (300 mg total) by mouth at bedtime.   glipiZIDE  (GLUCOTROL  XL) 10 MG 24 hr tablet TAKE 1 TABLET BY MOUTH EVERY DAY WITH BREAKFAST   JANUVIA  100 MG tablet Take 100 mg by mouth at bedtime.   Lancets (ONETOUCH DELICA PLUS LANCET33G) MISC USE UP TO 3 TIMES DAILY.   metFORMIN  (GLUCOPHAGE ) 1000 MG tablet TAKE 1 TABLET (1,000 MG TOTAL) BY MOUTH TWICE A DAY WITH FOOD   omega-3 acid ethyl esters (LOVAZA ) 1 g capsule TAKE 2 CAPSULES BY MOUTH TWICE A DAY   ONETOUCH VERIO test strip CHECK FASTING BLOOD SUGAR EVERY MORNING AND 2 HOURS AFTER LARGEST MEAL OF THE DAY.,    rosuvastatin  (CRESTOR ) 20 MG tablet Take 1 tablet (20 mg total) by mouth daily.   solifenacin (VESICARE) 5 MG tablet Take 1 tablet (5 mg total) by mouth daily.   tamsulosin  (FLOMAX ) 0.4 MG CAPS capsule Take 1 capsule (0.4 mg total) by mouth 2 (two) times daily.   [DISCONTINUED] lisinopril -hydrochlorothiazide  (ZESTORETIC ) 20-25 MG tablet Take 0.5 tablets by mouth daily.   [DISCONTINUED] simvastatin  (ZOCOR ) 40 MG tablet TAKE 1 TABLET BY MOUTH EVERYDAY AT BEDTIME   clopidogrel  (PLAVIX ) 75 MG tablet Take 1 tablet (75 mg total) by mouth daily. (Patient not taking: Reported on 04/25/2024)   lisinopril -hydrochlorothiazide  (ZESTORETIC ) 20-25 MG tablet Take 1 tablet by mouth daily.   [DISCONTINUED] dextromethorphan  (DELSYM ) 30 MG/5ML liquid Take 5 mLs (30 mg total) by mouth 2 (two) times daily. (Patient not taking: Reported on 04/25/2024)   [DISCONTINUED] magnesium  oxide (MAG-OX) 400 MG tablet Take 2 tablets (800 mg total) by mouth at bedtime. (Patient not taking: Reported on 04/25/2024)   [DISCONTINUED] pioglitazone  (ACTOS ) 30 MG tablet Take 1 tablet (30 mg total) by mouth at bedtime. (Patient not taking: Reported on 04/25/2024)   No facility-administered encounter medications on file as of 04/25/2024.    Past Medical History:  Diagnosis Date   Benign prostatic hyperplasia with urinary obstruction 03/25/2015   Carpal tunnel syndrome on left 12/25/2017   Chronic kidney disease    Critical limb  ischemia of right lower extremity (HCC) 06/15/2023   Diabetes mellitus without complication (HCC)    History of kidney stones    Hyperlipidemia    Hypertension    OSA (obstructive sleep apnea)    Peripheral arterial disease (HCC)    Pyelonephritis of left kidney 04/22/2022   Vitamin D deficiency     Past Surgical History:  Procedure Laterality Date   ABDOMINAL AORTOGRAM N/A 03/15/2024   Procedure: ABDOMINAL AORTOGRAM;  Surgeon: Magda Debby SAILOR, MD;  Location: [facility];  Service: Cardiovascular;   Laterality: N/A;   ABDOMINAL AORTOGRAM W/LOWER EXTREMITY N/A 05/26/2023   Procedure: ABDOMINAL AORTOGRAM W/LOWER EXTREMITY;  Surgeon: Magda Debby SAILOR, MD;  Location: [facility];  Service: Cardiovascular;  Laterality: N/A;   ABDOMINAL AORTOGRAM W/LOWER EXTREMITY N/A 07/21/2023   Procedure: ABDOMINAL AORTOGRAM W/LOWER EXTREMITY;  Surgeon: Magda Debby SAILOR, MD;  Location: [facility];  Service: Cardiovascular;  Laterality: N/A;   BIOPSY  08/07/2023   Procedure: BIOPSY;  Surgeon: Maryruth Ole DASEN, MD;  Location: [facility];  Service: Endoscopy;;   COLONOSCOPY     COLONOSCOPY WITH PROPOFOL  N/A 08/07/2023   Procedure: COLONOSCOPY WITH PROPOFOL ;  Surgeon: Maryruth Ole DASEN, MD;  Location: [facility];  Service: Endoscopy;  Laterality: N/A;   DG ARTHRO THUMB*L*     ENDARTERECTOMY FEMORAL Right 06/15/2023   Procedure: RIGHT COMMON FEMORAL ENDARTERECTOMY;  Surgeon: Magda Debby SAILOR, MD;  Location: [facility] OR;  Service: Vascular;  Laterality: Right;   ESOPHAGOGASTRODUODENOSCOPY (EGD) WITH PROPOFOL  N/A 08/07/2023   Procedure: ESOPHAGOGASTRODUODENOSCOPY (EGD) WITH PROPOFOL ;  Surgeon: Maryruth Ole DASEN, MD;  Location: [facility];  Service: Endoscopy;  Laterality: N/A;   INTRAOPERATIVE ARTERIOGRAM Right 06/15/2023   Procedure: INTRA OPERATIVE RIGHT LOWER EXTREMITY  ARTERIOGRAM;  Surgeon: Magda Debby SAILOR, MD;  Location: [facility];  Service: Vascular;  Laterality: Right;   IR ANGIOGRAM EXTREMITY BILATERAL  02/20/2018   IR ANGIOGRAM EXTREMITY BILATERAL  04/05/2019   IR ANGIOGRAM EXTREMITY LEFT  05/07/2020   IR ANGIOGRAM EXTREMITY LEFT  10/13/2022   IR ANGIOGRAM EXTREMITY RIGHT  02/15/2021   IR ANGIOGRAM EXTREMITY RIGHT  12/14/2021   IR ANGIOGRAM SELECTIVE EACH ADDITIONAL VESSEL  02/20/2018   IR ANGIOGRAM SELECTIVE EACH ADDITIONAL VESSEL  10/13/2022   IR FEM POP ART ATHERECT INC PTA MOD SED  02/20/2018   IR FEM POP ART ATHERECT INC PTA MOD SED  05/14/2019   IR FEM POP ART ATHERECT  INC PTA MOD SED  05/07/2020   IR FEM POP ART ATHERECT INC PTA MOD SED  02/15/2021   IR FEM POP ART STENT INC PTA MOD SED  12/14/2021   IR ILIAC ART STENT INC PTA MOD SED  10/13/2022   IR INTRAVASCULAR ULTRASOUND NON CORONARY  02/15/2021   IR INTRAVASCULAR ULTRASOUND NON CORONARY  12/14/2021   IR INTRAVASCULAR ULTRASOUND NON CORONARY  10/13/2022   IR RADIOLOGIST EVAL & MGMT  02/01/2018   IR RADIOLOGIST EVAL & MGMT  03/20/2018   IR RADIOLOGIST EVAL & MGMT  07/25/2018   IR RADIOLOGIST EVAL & MGMT  03/07/2019   IR RADIOLOGIST EVAL & MGMT  04/23/2019   IR RADIOLOGIST EVAL & MGMT  06/18/2019   IR RADIOLOGIST EVAL & MGMT  04/29/2020   IR RADIOLOGIST EVAL & MGMT  06/04/2020   IR RADIOLOGIST EVAL & MGMT  01/20/2021   IR RADIOLOGIST EVAL & MGMT  04/27/2021   IR RADIOLOGIST EVAL & MGMT  08/12/2021   IR RADIOLOGIST EVAL & MGMT  11/02/2021  IR RADIOLOGIST EVAL & MGMT  11/15/2021   IR RADIOLOGIST EVAL & MGMT  01/26/2022   IR RADIOLOGIST EVAL & MGMT  03/23/2022   IR RADIOLOGIST EVAL & MGMT  09/23/2022   IR RADIOLOGIST EVAL & MGMT  11/25/2022   IR RADIOLOGIST EVAL & MGMT  03/22/2023   IR US  GUIDE VASC ACCESS LEFT  04/05/2019   IR US  GUIDE VASC ACCESS LEFT  02/15/2021   IR US  GUIDE VASC ACCESS LEFT  12/14/2021   IR US  GUIDE VASC ACCESS RIGHT  02/20/2018   IR US  GUIDE VASC ACCESS RIGHT  05/14/2019   IR US  GUIDE VASC ACCESS RIGHT  05/07/2020   IR US  GUIDE VASC ACCESS RIGHT  10/13/2022   LOWER EXTREMITY ANGIOGRAPHY N/A 03/15/2024   Procedure: Lower Extremity Angiography;  Surgeon: Magda Debby SAILOR, MD;  Location: [facility];  Service: Cardiovascular;  Laterality: N/A;   LOWER EXTREMITY INTERVENTION N/A 03/15/2024   Procedure: LOWER EXTREMITY INTERVENTION;  Surgeon: Magda Debby SAILOR, MD;  Location: [facility];  Service: Cardiovascular;  Laterality: N/A;   NECK SURGERY     PATCH ANGIOPLASTY Right 06/15/2023   Procedure: PATCH ANGIOPLASTY USING [name] LINTS;  Surgeon: Magda Debby SAILOR,  MD;  Location: [facility] OR;  Service: Vascular;  Laterality: Right;   PERIPHERAL VASCULAR BALLOON ANGIOPLASTY  07/21/2023   Procedure: PERIPHERAL VASCULAR BALLOON ANGIOPLASTY;  Surgeon: Magda Debby SAILOR, MD;  Location: [facility];  Service: Cardiovascular;;   PERIPHERAL VASCULAR INTERVENTION  07/21/2023   Procedure: PERIPHERAL VASCULAR INTERVENTION;  Surgeon: Magda Debby SAILOR, MD;  Location: [facility];  Service: Cardiovascular;;   VASECTOMY      Family History  Problem Relation Age of Onset   Stroke Mother    Diabetes Mellitus II Mother    Prostate cancer Neg Hx    Kidney cancer Neg Hx    Bladder Cancer Neg Hx     Social History   Socioeconomic History   Marital status: Married    Spouse name: Not on file   Number of children: Not on file   Years of education: Not on file   Highest education level: Not on file  Occupational History   Not on file  Tobacco Use   Smoking status: Never   Smokeless tobacco: Never  Vaping Use   Vaping status: Never Used  Substance and Sexual Activity   Alcohol use: No   Drug use: No   Sexual activity: Yes    Birth control/protection: None  Other Topics Concern   Not on file  Social History Narrative   Not on file   Social Drivers of Health   Financial Resource Strain: Low Risk  (09/05/2023)   Received [facility]   Overall Financial Resource Strain (CARDIA)    Difficulty of Paying Living Expenses: Not hard at all  Food Insecurity: No Food Insecurity (09/05/2023)   Received from [facility]   Hunger Vital Sign    Within the past 12 months, you worried that your food would run out before you got the money to buy more.: Never true    Within the past 12 months, the food you bought just didn't last and you didn't have money to get more.: Never true  Transportation Needs: No Transportation Needs (09/05/2023)   Received from [facility] - Transportation    In the  past 12 months, has lack of transportation kept you from medical appointments or  from getting medications?: No    Lack of Transportation (Non-Medical): No  Physical Activity: Not on file  Stress: Not on file  Social Connections: Socially Isolated (08/07/2023)   Social Connection and Isolation Panel    Frequency of Communication with Friends and Family: Never    Frequency of Social Gatherings with Friends and Family: Never    Attends Religious Services: Never    Database administrator or Organizations: No    Attends Banker Meetings: Never    Marital Status: Married  Catering manager Violence: Not At Risk (08/05/2023)   Humiliation, Afraid, Rape, and Kick questionnaire    Fear of Current or Ex-Partner: No    Emotionally Abused: No    Physically Abused: No    Sexually Abused: No    ROS Refer to HPI    Objective   BP 120/68   Pulse 70   Ht 5' 1 (1.549 m)   Wt 136 lb 4 oz (61.8 kg)   SpO2 96%   BMI 25.74 kg/m   Physical Exam Constitutional:      Appearance: Normal appearance.  HENT:     Head: Normocephalic and atraumatic.     Mouth/Throat:     Mouth: Mucous membranes are moist.     Pharynx: Oropharynx is clear.  Eyes:     Extraocular Movements: Extraocular movements intact.     Conjunctiva/sclera: Conjunctivae normal.     Pupils: Pupils are equal, round, and reactive to light.  Cardiovascular:     Rate and Rhythm: Normal rate and regular rhythm.     Pulses: Normal pulses.     Heart sounds: No murmur heard. Pulmonary:     Effort: Pulmonary effort is normal.     Breath sounds: No rhonchi or rales.  Abdominal:     General: Abdomen is flat. Bowel sounds are normal. There is no distension.     Palpations: Abdomen is soft.     Tenderness: There is no abdominal tenderness.  Musculoskeletal:        General: No deformity.     Right lower leg: No edema.     Left lower leg: No edema.  Skin:    General: Skin is warm and dry.     Capillary Refill: Capillary  refill takes less than 2 seconds.  Neurological:     General: No focal deficit present.     Mental Status: He is alert and oriented to person, place, and time.  Psychiatric:        Mood and Affect: Mood normal.        Behavior: Behavior normal.        04/25/2024   10:22 AM 08/31/2023   12:52 PM 06/01/2023    9:15 AM  Depression screen PHQ 2/9  Decreased Interest 0 0 0  Down, Depressed, Hopeless 0 0 0  PHQ - 2 Score 0 0 0  Altered sleeping 0    Tired, decreased energy 0    Change in appetite 0    Feeling bad or failure about yourself  0    Trouble concentrating 0    Moving slowly or fidgety/restless 0    Suicidal thoughts 0    PHQ-9 Score 0    Difficult doing work/chores Not difficult at all        04/25/2024   10:22 AM  GAD 7 : Generalized Anxiety Score  Nervous, Anxious, on Edge 0  Control/stop worrying 0  Worry too much - different things 0  Trouble relaxing  0  Restless 0  Easily annoyed or irritable 0  Afraid - awful might happen 0  Total GAD 7 Score 0  Anxiety Difficulty Not difficult at all    Last CBC Lab Results  Component Value Date   WBC 5.8 02/05/2024   HGB 11.6 (L) 02/05/2024   HCT 36.2 (L) 02/05/2024   MCV 88 02/05/2024   MCH 28.3 02/05/2024   RDW 14.1 02/05/2024   PLT 322 02/05/2024   Last metabolic panel Lab Results  Component Value Date   GLUCOSE 179 (H) 02/05/2024   NA 136 02/05/2024   K 5.2 02/05/2024   CL 105 02/05/2024   CO2 15 (L) 02/05/2024   BUN 50 (H) 02/05/2024   CREATININE 1.74 (H) 02/05/2024   EGFR 39 (L) 02/05/2024   CALCIUM  10.1 02/05/2024   PHOS 3.7 08/08/2023   PROT 6.9 02/05/2024   ALBUMIN  4.2 02/05/2024   LABGLOB 2.7 02/05/2024   BILITOT 0.2 02/05/2024   ALKPHOS 29 (L) 02/05/2024   AST 19 02/05/2024   ALT 19 02/05/2024   ANIONGAP 7 08/08/2023   Last lipids Lab Results  Component Value Date   CHOL 155 02/05/2024   HDL 28 (L) 02/05/2024   LDLCALC 89 02/05/2024   LDLDIRECT 58 07/25/2018   TRIG 221 (H)  02/05/2024   CHOLHDL 5.5 (H) 02/05/2024   Last hemoglobin A1c Lab Results  Component Value Date   HGBA1C 8.5 (H) 02/05/2024   Last thyroid  functions Lab Results  Component Value Date   TSH 0.849 02/05/2024    Last vitamin B12 and Folate Lab Results  Component Value Date   VITAMINB12 746 02/05/2024   FOLATE 8.4 02/05/2024        Assessment & Plan:  Type 2 diabetes mellitus with stage 3b chronic kidney disease, without long-term current use of insulin  (HCC) Assessment & Plan: Reports home fasting glucoses ranging between 107-190. He is concerned glucoses are tending to be higher than previous. Denies any major changes in diet or activity. Medications are  Metformin  1000mg  twice daily, januvia  100 mg daily, and glipizide  10 mg daily, is compliant with medications and has not missed recent doses. Has not had any hypoglycemic events but report dizziness when finger sticks are in the 80s. Improves with eating a snack. A1c 8.5% on 02/05/2024, poorly controlled. Urine microalbumin 270 on 02/05/2024. Wife reports nephrologist asked him to discuss with starting SGLT2i previously. Discussed risks and benefits of SGLT2i  and he is agreeable to trailing this. -Start jardiance  10 mg daily  -Continue current medications -Follow up in 1 month for A1c      Symptomatic anemia Assessment & Plan: This is improving CBC, hbg 11.6 with improved iron  panel.  is currently on DAPT with ASA and Plavix  for PAD. Is taking ferrous sulfate  TID. Decrease iron  to daily/every other day  dosing. CBC at next visit   Dyslipidemia Assessment & Plan: On simvastatin  40mg  daily. Switch to rosvuastin   Muscle cramps Assessment & Plan: Is currently taking gabapentin  300 mg daily and recent revascularization and feeling better. Continue gabapentin  300 mg daily.    Essential hypertension, benign Assessment & Plan: BP today 120/68. Current medications are lisinopril -hydrochlorothiazide  20-25 mg daily. CFR has  been stable around 39. Continue current medications.   Orders: -     Lisinopril -hydroCHLOROthiazide ; Take 1 tablet by mouth daily.  Steatosis of liver Assessment & Plan: Noted on CT AP on 04/22/2022. Fib 4 score is low. Recommended continuing stating, healthy diet and exercise.  Stage 3b chronic kidney disease (HCC) Assessment & Plan: CKDIIIbA3 Due to uncontrolled diabetes. Elevated microalbumin/creatinine. Started on jardiance  10 mg daily today.  Follow up with nephrology   PAD (peripheral artery disease) [facility]) Assessment & Plan: S/p Left femoropopliteal drug-coated angioplasty on 03/15/2024 with vascular surgery. Improvement in LLE pain. On asa 81 mg daily and plavix  75 mg daily and rosuvastatin  simvastatin . LDL 89 on 6/30. Switch to high intensity statin given LDL>70, bilateral PAD and aortic atherosclerosis on imaging. Repeat lipid panel in 6 months if still elevated may benefit from PCSK9i. Follow up with vascular surgery for ABI and LLE arterial duplex   Obstructive apnea Assessment & Plan: Reports he is using this daily.    Benign prostatic hyperplasia, unspecified whether lower urinary tract symptoms present Assessment & Plan: Continue flomax  0.4 mg twice daily and vesicare 5 gm daily.    Other orders -     Empagliflozin ; Take 1 tablet (10 mg total) by mouth daily.  Dispense: 10 tablet; Refill: 3 -     Rosuvastatin  Calcium ; Take 1 tablet (20 mg total) by mouth daily.  Dispense: 90 tablet; Refill: 1    Return in about 4 weeks (around 05/23/2024) for DM.   Harlene Saddler, MD

## 2024-04-25 NOTE — Assessment & Plan Note (Signed)
 On simvastatin  40mg  daily. Switch to rosvuastin

## 2024-04-25 NOTE — Assessment & Plan Note (Signed)
 Reports home fasting glucoses ranging between 107-190. He is concerned glucoses are tending to be higher than previous. Denies any major changes in diet or activity. Medications are  Metformin  1000mg  twice daily, januvia  100 mg daily, and glipizide  10 mg daily, is compliant with medications and has not missed recent doses. Has not had any hypoglycemic events but report dizziness when finger sticks are in the 80s. Improves with eating a snack. A1c 8.5% on 02/05/2024, poorly controlled. Urine microalbumin 270 on 02/05/2024. Wife reports nephrologist asked him to discuss with starting SGLT2i previously. Discussed risks and benefits of SGLT2i  and he is agreeable to trailing this. -Start jardiance  10 mg daily  -Continue current medications -Follow up in 1 month for A1c

## 2024-04-25 NOTE — Patient Instructions (Addendum)
 Please start Jardiance  10 mg day for diabetes Continue taking metformin , januvia , glipizide  Please decrease ferrous sulfate  to 1 tablet a day  Start rosuvastatin  20 mg daily, stop taking simvastatin  once you start rosuvastatin 

## 2024-04-26 ENCOUNTER — Telehealth: Payer: Self-pay | Admitting: Student

## 2024-04-26 NOTE — Telephone Encounter (Signed)
 Error- please disregard

## 2024-04-29 ENCOUNTER — Encounter: Payer: Self-pay | Admitting: Student

## 2024-04-29 NOTE — Assessment & Plan Note (Signed)
 Noted on CT AP on 04/22/2022. Fib 4 score is low. Recommended continuing stating, healthy diet and exercise.

## 2024-04-29 NOTE — Assessment & Plan Note (Signed)
 Continue flomax  0.4 mg twice daily and vesicare 5 gm daily.

## 2024-04-29 NOTE — Assessment & Plan Note (Addendum)
 CKDIIIbA3 Due to uncontrolled diabetes. Elevated microalbumin/creatinine. Started on jardiance  10 mg daily today.  Follow up with nephrology

## 2024-04-29 NOTE — Assessment & Plan Note (Signed)
 Reports he is using this daily.

## 2024-04-29 NOTE — Assessment & Plan Note (Signed)
 S/p Left femoropopliteal drug-coated angioplasty on 03/15/2024 with vascular surgery. Improvement in LLE pain. On asa 81 mg daily and plavix  75 mg daily and rosuvastatin  simvastatin . LDL 89 on 6/30. Switch to high intensity statin given LDL>70, bilateral PAD and aortic atherosclerosis on imaging. Repeat lipid panel in 6 months if still elevated may benefit from PCSK9i. Follow up with vascular surgery for ABI and LLE arterial duplex

## 2024-04-29 NOTE — Assessment & Plan Note (Signed)
 BP today 120/68. Current medications are lisinopril -hydrochlorothiazide  20-25 mg daily. CFR has been stable around 39. Continue current medications.

## 2024-05-06 NOTE — Progress Notes (Unsigned)
 [facility] [place]  ASSESSMENT / PLAN: Michael Thomas is a 78 y.o. male status post right femoral endarterectomy and femoral-popliteal angioplasty and stenting.  Recommend the following which can slow the progression of atherosclerosis and reduce the risk of major adverse cardiac / limb events:  Complete cessation from all tobacco products. Blood glucose control with goal A1c < 7%. Blood pressure control with goal blood pressure < 140/90 mmHg. Lipid reduction therapy with goal LDL-C <100 mg/dL (<29 if symptomatic from PAD).  Aspirin  81mg  PO QD.  Clopidogrel  75mg  by mouth daily Atorvastatin 40-80mg  PO QD (or other high intensity statin therapy).  Patient reports no symptoms in the right lower extremity.  He does have a early claudication symptoms in the left leg.  We will monitor this closely.  Continue dual antiplatelet therapy for now.  CHIEF COMPLAINT: Claudication  HISTORY OF PRESENT ILLNESS: Wasif Thomas is a 78 y.o. male referred from Dr. Alona for discussion of surgical options for intermittent claudication in bilateral lower extremities.  The patient is undergone multiple endovascular interventions for lifestyle limiting claudication by Dr. Alona.  He reports good symptom relief in the short-term after these interventions, but gradual return to baseline level of discomfort afterwards.  He has a good amount of exercise tolerance from my perspective.  He reports he can walk on a flat surface for greater than a mile.  He has difficulty on incline surfaces.  He has difficulty jogging.  04/25/23: Patient returns to clinic for evaluation.  He describes fairly classic claudication symptoms in his right calf which are bothersome to him.  These began at about 50 yards.  This is interfering with his lifestyle.  He strongly desires intervention.  05/16/23: Patient returns to clinic to review CT angiogram. Unfortunately, his renal function did not permit  this test, so we obtained duplex ultrasound of the aorta, iliacs, and RLE. We reviewed these and developed a plan to do angiography next.  07/04/23: Patient returns to clinic.  He initially felt better after surgery, but now reports claudication symptoms are worse.  His incision has healed appropriately.  11/07/23: Patient returns to clinic for surveillance.  He is doing very well overall with his right lower extremity.  He does report some claudication symptoms in the left lower extremity while walking on a treadmill.  He continues to have good exercise tolerance, but as before is a bit frustrated by his claudication symptoms.  He is not yet ready to pursue intervention.  He has low level of faith in the noninvasive testing as it has been erroneous in him before.  Past Medical History:  Diagnosis Date   Benign prostatic hyperplasia with urinary obstruction 03/25/2015   Carpal tunnel syndrome on left 12/25/2017   Chronic kidney disease    Critical limb ischemia of right lower extremity (HCC) 06/15/2023   Diabetes mellitus without complication (HCC)    History of kidney stones    Hyperlipidemia    Hypertension    OSA (obstructive sleep apnea)    Peripheral arterial disease    Pyelonephritis of left kidney 04/22/2022   Vitamin D deficiency     Past Surgical History:  Procedure Laterality Date   ABDOMINAL AORTOGRAM N/A 03/15/2024   Procedure: ABDOMINAL AORTOGRAM;  Surgeon: Magda Debby SAILOR, MD;  Location: [facility];  Service: Cardiovascular;  Laterality: N/A;   ABDOMINAL AORTOGRAM W/LOWER EXTREMITY N/A 05/26/2023   Procedure: ABDOMINAL AORTOGRAM W/LOWER EXTREMITY;  Surgeon: Magda Debby SAILOR, MD;  Location: [facility]  INVASIVE CV LAB;  Service: Cardiovascular;  Laterality: N/A;   ABDOMINAL AORTOGRAM W/LOWER EXTREMITY N/A 07/21/2023   Procedure: ABDOMINAL AORTOGRAM W/LOWER EXTREMITY;  Surgeon: Magda Debby SAILOR, MD;  Location: [facility] INVASIVE CV LAB;  Service: Cardiovascular;  Laterality: N/A;    BIOPSY  08/07/2023   Procedure: BIOPSY;  Surgeon: Maryruth Ole DASEN, MD;  Location: [facility] ENDOSCOPY;  Service: Endoscopy;;   COLONOSCOPY     COLONOSCOPY WITH PROPOFOL  N/A 08/07/2023   Procedure: COLONOSCOPY WITH PROPOFOL ;  Surgeon: Maryruth Ole DASEN, MD;  Location: [facility] ENDOSCOPY;  Service: Endoscopy;  Laterality: N/A;   DG ARTHRO THUMB*L*     ENDARTERECTOMY FEMORAL Right 06/15/2023   Procedure: RIGHT COMMON FEMORAL ENDARTERECTOMY;  Surgeon: Magda Debby SAILOR, MD;  Location: [facility] OR;  Service: Vascular;  Laterality: Right;   ESOPHAGOGASTRODUODENOSCOPY (EGD) WITH PROPOFOL  N/A 08/07/2023   Procedure: ESOPHAGOGASTRODUODENOSCOPY (EGD) WITH PROPOFOL ;  Surgeon: Maryruth Ole DASEN, MD;  Location: [facility] ENDOSCOPY;  Service: Endoscopy;  Laterality: N/A;   INTRAOPERATIVE ARTERIOGRAM Right 06/15/2023   Procedure: INTRA OPERATIVE RIGHT LOWER EXTREMITY  ARTERIOGRAM;  Surgeon: Magda Debby SAILOR, MD;  Location: [facility] OR;  Service: Vascular;  Laterality: Right;   IR ANGIOGRAM EXTREMITY BILATERAL  02/20/2018   IR ANGIOGRAM EXTREMITY BILATERAL  04/05/2019   IR ANGIOGRAM EXTREMITY LEFT  05/07/2020   IR ANGIOGRAM EXTREMITY LEFT  10/13/2022   IR ANGIOGRAM EXTREMITY RIGHT  02/15/2021   IR ANGIOGRAM EXTREMITY RIGHT  12/14/2021   IR ANGIOGRAM SELECTIVE EACH ADDITIONAL VESSEL  02/20/2018   IR ANGIOGRAM SELECTIVE EACH ADDITIONAL VESSEL  10/13/2022   IR FEM POP ART ATHERECT INC PTA MOD SED  02/20/2018   IR FEM POP ART ATHERECT INC PTA MOD SED  05/14/2019   IR FEM POP ART ATHERECT INC PTA MOD SED  05/07/2020   IR FEM POP ART ATHERECT INC PTA MOD SED  02/15/2021   IR FEM POP ART STENT INC PTA MOD SED  12/14/2021   IR ILIAC ART STENT INC PTA MOD SED  10/13/2022   IR INTRAVASCULAR ULTRASOUND NON CORONARY  02/15/2021   IR INTRAVASCULAR ULTRASOUND NON CORONARY  12/14/2021   IR INTRAVASCULAR ULTRASOUND NON CORONARY  10/13/2022   IR RADIOLOGIST EVAL & MGMT  02/01/2018   IR RADIOLOGIST EVAL & MGMT  03/20/2018   IR  RADIOLOGIST EVAL & MGMT  07/25/2018   IR RADIOLOGIST EVAL & MGMT  03/07/2019   IR RADIOLOGIST EVAL & MGMT  04/23/2019   IR RADIOLOGIST EVAL & MGMT  06/18/2019   IR RADIOLOGIST EVAL & MGMT  04/29/2020   IR RADIOLOGIST EVAL & MGMT  06/04/2020   IR RADIOLOGIST EVAL & MGMT  01/20/2021   IR RADIOLOGIST EVAL & MGMT  04/27/2021   IR RADIOLOGIST EVAL & MGMT  08/12/2021   IR RADIOLOGIST EVAL & MGMT  11/02/2021   IR RADIOLOGIST EVAL & MGMT  11/15/2021   IR RADIOLOGIST EVAL & MGMT  01/26/2022   IR RADIOLOGIST EVAL & MGMT  03/23/2022   IR RADIOLOGIST EVAL & MGMT  09/23/2022   IR RADIOLOGIST EVAL & MGMT  11/25/2022   IR RADIOLOGIST EVAL & MGMT  03/22/2023   IR US  GUIDE VASC ACCESS LEFT  04/05/2019   IR US  GUIDE VASC ACCESS LEFT  02/15/2021   IR US  GUIDE VASC ACCESS LEFT  12/14/2021   IR US  GUIDE VASC ACCESS RIGHT  02/20/2018   IR US  GUIDE VASC ACCESS RIGHT  05/14/2019   IR US  GUIDE VASC ACCESS RIGHT  05/07/2020   IR US  GUIDE VASC ACCESS  RIGHT  10/13/2022   LOWER EXTREMITY ANGIOGRAPHY N/A 03/15/2024   Procedure: Lower Extremity Angiography;  Surgeon: Magda Debby SAILOR, MD;  Location: [facility] INVASIVE CV LAB;  Service: Cardiovascular;  Laterality: N/A;   LOWER EXTREMITY INTERVENTION N/A 03/15/2024   Procedure: LOWER EXTREMITY INTERVENTION;  Surgeon: Magda Debby SAILOR, MD;  Location: [facility] INVAS[facility];  Service: Cardiovascular;  Laterality: N/A;   NECK SURGERY     PATCH ANGIOPLASTY Right 06/15/2023   Procedure: PATCH ANGIOPLASTY USING [name] LINTS;  Surgeon: Magda Debby SAILOR, MD;  Location: [facility] OR;  Service: Vascular;  Laterality: Right;   PERIPHERAL VASCULAR BALLOON ANGIOPLASTY  07/21/2023   Procedure: PERIPHERAL VASCULAR BALLOON ANGIOPLASTY;  Surgeon: Magda Debby SAILOR, MD;  Location: [facility];  Service: Cardiovascular;;   PERIPHERAL VASCULAR INTERVENTION  07/21/2023   Procedure: PERIPHERAL VASCULAR INTERVENTION;  Surgeon: Magda Debby SAILOR, MD;  Location: [facility];  Service:  Cardiovascular;;   VASECTOMY      Family History  Problem Relation Age of Onset   Stroke Mother    Diabetes Mellitus II Mother    Prostate cancer Neg Hx    Kidney cancer Neg Hx    Bladder Cancer Neg Hx     Social History   Socioeconomic History   Marital status: Married    Spouse name: Not on file   Number of children: Not on file   Years of education: Not on file   Highest education level: Not on file  Occupational History   Not on file  Tobacco Use   Smoking status: Never   Smokeless tobacco: Never  Vaping Use   Vaping status: Never Used  Substance and Sexual Activity   Alcohol use: No   Drug use: No   Sexual activity: Yes    Birth control/protection: None  Other Topics Concern   Not on file  Social History Narrative   Not on file   Social Drivers of Health   Financial Resource Strain: Low Risk  (09/05/2023)   Received from [facility]   Overall Financial Resource Strain (CARDIA)    Difficulty of Paying Living Expenses: Not hard at all  Food Insecurity: No Food Insecurity (09/05/2023)   Received from [facility]   Hunger Vital Sign    Within the past 12 months, you worried that your food would run out before you got the money to buy more.: Never true    Within the past 12 months, the food you bought just didn't last and you didn't have money to get more.: Never true  Transportation Needs: No Transportation Needs (09/05/2023)   Received from [facility] - [facility]    In the past 12 months, has lack of transportation kept you from medical appointments or from getting medications?: No    Lack of Transportation (Non-Medical): No  Physical Activity: Not on file  Stress: Not on file  Social Connections: Socially Isolated (08/07/2023)   Social Connection and Isolation Panel    Frequency of Communication with Friends and Family: Never    Frequency of Social Gatherings with Friends and Family: Never     Attends Religious Services: Never    Database administrator or Organizations: No    Attends Banker Meetings: Never    Marital Status: Married  Catering manager Violence: Not At Risk (08/05/2023)   Humiliation, Afraid, Rape, and Kick questionnaire    Fear of Current or Ex-Partner: No  Emotionally Abused: No    Physically Abused: No    Sexually Abused: No    Allergies  Allergen Reactions   Amoxicillin  Other (See Comments)    Fatigue and Myalgia and Rigors x2 attempts    Current Outpatient Medications  Medication Sig Dispense Refill   AMBULATORY NON FORMULARY MEDICATION One Touch Ultra 2 glucometer 1 each 0   aspirin  EC 81 MG tablet Take 81 mg by mouth at bedtime.     Blood Glucose Monitoring Suppl (ONETOUCH VERIO REFLECT) w/Device KIT Check fasting blood sugar every morning and 2 hours after largest meal of the day., 1 kit 0   clobetasol  ointment (TEMOVATE ) 0.05 % APPLY TOPICALLY TO AFFECTED AREA(S) TWICE DAILY AS NEEDED 45 g 2   clopidogrel  (PLAVIX ) 75 MG tablet Take 1 tablet (75 mg total) by mouth daily. (Patient not taking: Reported on 04/25/2024) 30 tablet 11   cyanocobalamin  1000 MCG tablet Take 1 tablet (1,000 mcg total) by mouth daily. 90 tablet 1   empagliflozin  (JARDIANCE ) 10 MG TABS tablet Take 1 tablet (10 mg total) by mouth daily. 10 tablet 3   fenofibrate  160 MG tablet TAKE 1 TABLET BY MOUTH EVERY DAY 90 tablet 3   ferrous sulfate  325 (65 FE) MG EC tablet Take 1 tablet (325 mg total) by mouth 3 (three) times daily with meals. 90 tablet 11   gabapentin  (NEURONTIN ) 300 MG capsule Take 1 capsule (300 mg total) by mouth at bedtime. 30 capsule 3   glipiZIDE  (GLUCOTROL  XL) 10 MG 24 hr tablet TAKE 1 TABLET BY MOUTH EVERY DAY WITH BREAKFAST 90 tablet 3   JANUVIA  100 MG tablet Take 100 mg by mouth at bedtime.     Lancets (ONETOUCH DELICA PLUS LANCET33G) MISC USE UP TO 3 TIMES DAILY. 100 each 11   lisinopril -hydrochlorothiazide  (ZESTORETIC ) 20-25 MG tablet Take 1  tablet by mouth daily.     metFORMIN  (GLUCOPHAGE ) 1000 MG tablet TAKE 1 TABLET (1,000 MG TOTAL) BY MOUTH TWICE A DAY WITH FOOD 180 tablet 2   omega-3 acid ethyl esters (LOVAZA ) 1 g capsule TAKE 2 CAPSULES BY MOUTH TWICE A DAY 360 capsule 3   ONETOUCH VERIO test strip CHECK FASTING BLOOD SUGAR EVERY MORNING AND 2 HOURS AFTER LARGEST MEAL OF THE DAY., 100 strip 12   rosuvastatin  (CRESTOR ) 20 MG tablet Take 1 tablet (20 mg total) by mouth daily. 90 tablet 1   solifenacin (VESICARE) 5 MG tablet Take 1 tablet (5 mg total) by mouth daily.     tamsulosin  (FLOMAX ) 0.4 MG CAPS capsule Take 1 capsule (0.4 mg total) by mouth 2 (two) times daily. 180 capsule 3   No current facility-administered medications for this visit.    PHYSICAL EXAM There were no vitals filed for this visit.     Constitutional: well appearing. no distress. Appears well nourished.  Neurologic: CN intact. no focal findings. no sensory loss. Psychiatric:  Mood and affect symmetric and appropriate. Eyes:  No icterus. No conjunctival pallor. Ears, nose, throat:  mucous membranes moist. Midline trachea.  Cardiac: regular rate and rhythm.  Respiratory:  unlabored. Abdominal:  soft, non-tender, non-distended.  Peripheral vascular: 1+ L DP / PT pulse. 2+ R DP / PT pulse Extremity: no edema. no cyanosis. no pallor.  Skin: no gangrene. no ulceration.  Lymphatic: no Stemmer's sign. no palpable lymphadenopathy.  PERTINENT LABORATORY AND RADIOLOGIC DATA  Most recent CBC    Latest Ref Rng & Units 02/05/2024   10:35 AM 08/17/2023   10:22 AM 08/08/2023  4:24 AM  CBC  WBC 3.4 - 10.8 x10E3/uL 5.8  4.8  5.4   Hemoglobin 13.0 - 17.7 g/dL 88.3  7.5  7.7   Hematocrit 37.5 - 51.0 % 36.2  23.9  23.9   Platelets 150 - 450 x10E3/uL 322  441  529      Most recent CMP    Latest Ref Rng & Units 02/05/2024   10:35 AM 08/17/2023   10:22 AM 08/08/2023    4:24 AM  CMP  Glucose 70 - 99 mg/dL 820  784  858   BUN 8 - 27 mg/dL 50  49  27    Creatinine 0.76 - 1.27 mg/dL 8.25  8.24  8.40   Sodium 134 - 144 mmol/L 136  135  135   Potassium 3.5 - 5.2 mmol/L 5.2  5.4  4.3   Chloride 96 - 106 mmol/L 105  103  107   CO2 20 - 29 mmol/L 15  19  21    Calcium  8.6 - 10.2 mg/dL 89.8  9.4  8.8   Total Protein 6.0 - 8.5 g/dL 6.9  6.4  6.0   Total Bilirubin 0.0 - 1.2 mg/dL 0.2  <9.7  0.5   Alkaline Phos 44 - 121 IU/L 29  33  28   AST 0 - 40 IU/L 19  12  13    ALT 0 - 44 IU/L 19  10  11      Renal function CrCl cannot be calculated (Patient's most recent lab result is older than the maximum 21 days allowed.).  HbA1c, POC (controlled diabetic range) (%)  Date Value  02/16/2022 7.7 (A)   Hgb A1c MFr Bld (%)  Date Value  02/05/2024 8.5 (H)    LDL Cholesterol (Calc)  Date Value Ref Range Status  09/06/2022 81 mg/dL (calc) Final    Comment:    Reference range: <100 . Desirable range <100 mg/dL for primary prevention;   <70 mg/dL for patients with CHD or diabetic patients  with > or = 2 CHD risk factors. SABRA LDL-C is now calculated using the Martin-Hopkins  calculation, which is a validated novel method providing  better accuracy than the Friedewald equation in the  estimation of LDL-C.  Gladis APPLETHWAITE et al. SANDREA. 7986;689(80): 2061-2068  (http://education.[employer].com/faq/FAQ164)    LDL Chol Calc (NIH)  Date Value Ref Range Status  02/05/2024 89 0 - 99 mg/dL Final   Direct LDL  Date Value Ref Range Status  07/25/2018 58 <100 mg/dL Final    Comment:    . Desirable range <100 mg/dL for primary prevention;   <70 mg/dL for patients with CHD or diabetic patients  with > or = 2 CHD risk factors. SABRA     +-------+-----------+-----------+------------+------------+  ABI/TBIToday's ABIToday's TBIPrevious ABIPrevious TBI  +-------+-----------+-----------+------------+------------+  Right 1.05       0.71       1.05        0.72          +-------+-----------+-----------+------------+------------+  Left  1.04        0.70       1.00        0.79          +-------+-----------+-----------+------------+------------+   Right lower extremity arterial duplex: Patent stent without evidence of stenosis.   Debby SAILOR. Magda, MD [facility] Phone Number: [phone] 05/06/2024 5:18 PM  Total time spent on preparing this encounter including chart review, data review, collecting history, examining the  patient, coordinating care for this established patient, 30 minutes.   Portions of this report may have been transcribed using voice recognition software.  Every effort has been made to ensure accuracy; however, inadvertent computerized transcription errors may still be present.

## 2024-05-07 ENCOUNTER — Ambulatory Visit: Admitting: Vascular Surgery

## 2024-05-07 ENCOUNTER — Ambulatory Visit (HOSPITAL_BASED_OUTPATIENT_CLINIC_OR_DEPARTMENT_OTHER)
Admission: RE | Admit: 2024-05-07 | Discharge: 2024-05-07 | Disposition: A | Discharge status: Home / Self Care | Source: Ambulatory Visit | Attending: Vascular Surgery | Admitting: Vascular Surgery

## 2024-05-07 ENCOUNTER — Ambulatory Visit (HOSPITAL_COMMUNITY)
Admission: RE | Admit: 2024-05-07 | Discharge: 2024-05-07 | Disposition: A | Discharge status: Home / Self Care | Source: Ambulatory Visit | Attending: Vascular Surgery | Admitting: Vascular Surgery

## 2024-05-07 ENCOUNTER — Encounter: Payer: Self-pay | Admitting: Vascular Surgery

## 2024-05-07 VITALS — BP 172/82 | HR 67 | Temp 98.0°F | Ht 61.0 in | Wt 133.0 lb

## 2024-05-07 DIAGNOSIS — I739 Peripheral vascular disease, unspecified: Secondary | ICD-10-CM | POA: Insufficient documentation

## 2024-05-07 DIAGNOSIS — I70219 Atherosclerosis of native arteries of extremities with intermittent claudication, unspecified extremity: Secondary | ICD-10-CM | POA: Diagnosis not present

## 2024-05-07 LAB — VAS US ABI WITH/WO TBI
Left ABI: 1.04
Right ABI: 1.16

## 2024-05-09 ENCOUNTER — Other Ambulatory Visit: Payer: Self-pay | Admitting: *Deleted

## 2024-05-09 DIAGNOSIS — I739 Peripheral vascular disease, unspecified: Secondary | ICD-10-CM

## 2024-05-09 DIAGNOSIS — I70219 Atherosclerosis of native arteries of extremities with intermittent claudication, unspecified extremity: Secondary | ICD-10-CM

## 2024-05-11 DIAGNOSIS — J189 Pneumonia, unspecified organism: Secondary | ICD-10-CM | POA: Diagnosis not present

## 2024-05-11 DIAGNOSIS — R509 Fever, unspecified: Secondary | ICD-10-CM | POA: Diagnosis not present

## 2024-05-11 DIAGNOSIS — Z03818 Encounter for observation for suspected exposure to other biological agents ruled out: Secondary | ICD-10-CM | POA: Diagnosis not present

## 2024-05-11 DIAGNOSIS — R051 Acute cough: Secondary | ICD-10-CM | POA: Diagnosis not present

## 2024-05-15 ENCOUNTER — Ambulatory Visit: Admitting: Student

## 2024-05-15 ENCOUNTER — Encounter: Payer: Self-pay | Admitting: Student

## 2024-05-15 VITALS — BP 112/64 | HR 85 | Temp 97.6°F | Ht 61.0 in | Wt 125.4 lb

## 2024-05-15 DIAGNOSIS — E1122 Type 2 diabetes mellitus with diabetic chronic kidney disease: Secondary | ICD-10-CM

## 2024-05-15 DIAGNOSIS — N1832 Chronic kidney disease, stage 3b: Secondary | ICD-10-CM

## 2024-05-15 DIAGNOSIS — Z7984 Long term (current) use of oral hypoglycemic drugs: Secondary | ICD-10-CM

## 2024-05-15 DIAGNOSIS — J189 Pneumonia, unspecified organism: Secondary | ICD-10-CM | POA: Diagnosis not present

## 2024-05-15 MED ORDER — EMPAGLIFLOZIN 10 MG PO TABS: 10.0000 mg | ORAL_TABLET | Freq: Every day | ORAL | 3 refills | Status: AC

## 2024-05-15 NOTE — Result Encounter Note (Signed)
 Continue same instruction.

## 2024-05-15 NOTE — Progress Notes (Unsigned)
 Established Patient Office Visit  Subjective   Patient ID: Michael Thomas, male    DOB: 06-13-1946  Age: 78 y.o. MRN: [id number]  Chief Complaint  Patient presents with   Pneumonia    F/up from [facility] clinic urgent care appointment     Michael Thomas with medical hx listed below presents today for follow up of CPA. Was seen at [facility] UC on *** and started on antibiotics. Was placed on Moxifloxacin and prednisone . Notes blood sugar are high in the 300s   Feeling tired and sick after recent trip for a wedding   Does not have much of an appetites  BMP   Patient Active Problem List   Diagnosis Date Noted   Rotator cuff tear, left 02/05/2024   Post-viral cough syndrome 12/19/2023   Degeneration of intervertebral disc of lumbar region with discogenic back pain 08/31/2023   Symptomatic anemia 08/03/2023   Lumbar facet arthropathy 11/28/2022   Multiple acquired cysts of kidney 10/04/2022   Proteinuria 10/04/2022   Steatosis of liver 10/04/2022   Stage 3b chronic kidney disease (HCC) 04/22/2022   PAD (peripheral artery disease) 04/22/2022   Muscle cramps 08/18/2021   LPRD (laryngopharyngeal reflux disease) 01/22/2019   Perennial allergic rhinitis 10/22/2018   Primary osteoarthritis of both knees 07/25/2018   Annual physical exam 12/25/2017   Left calf claudication, SFA arterial disease 09/19/2017   Prostate cancer screening 05/03/2016   BPH (benign prostatic hyperplasia) with detrusor instability/OAB 03/25/2015   Type 2 diabetes mellitus with diabetic chronic kidney disease (HCC) 01/24/2014   Obstructive apnea 01/24/2014   Essential hypertension, benign 02/01/2013   Hyperlipidemia 02/01/2013      ROS Refer to HPI    Objective:     Outpatient Encounter Medications as of 05/15/2024  Medication Sig   AMBULATORY NON FORMULARY MEDICATION One Touch Ultra 2 glucometer   aspirin  EC 81 MG tablet Take 81 mg by mouth at bedtime.   Blood Glucose Monitoring Suppl  (ONETOUCH VERIO REFLECT) w/Device KIT Check fasting blood sugar every morning and 2 hours after largest meal of the day.,   clobetasol  ointment (TEMOVATE ) 0.05 % APPLY TOPICALLY TO AFFECTED AREA(S) TWICE DAILY AS NEEDED   clopidogrel  (PLAVIX ) 75 MG tablet Take 1 tablet (75 mg total) by mouth daily.   cyanocobalamin  1000 MCG tablet Take 1 tablet (1,000 mcg total) by mouth daily.   empagliflozin  (JARDIANCE ) 10 MG TABS tablet Take 1 tablet (10 mg total) by mouth daily.   fenofibrate  160 MG tablet TAKE 1 TABLET BY MOUTH EVERY DAY   ferrous sulfate  325 (65 FE) MG EC tablet Take 1 tablet (325 mg total) by mouth 3 (three) times daily with meals.   gabapentin  (NEURONTIN ) 300 MG capsule Take 1 capsule (300 mg total) by mouth at bedtime.   glipiZIDE  (GLUCOTROL  XL) 10 MG 24 hr tablet TAKE 1 TABLET BY MOUTH EVERY DAY WITH BREAKFAST   JANUVIA  100 MG tablet Take 100 mg by mouth at bedtime.   Lancets (ONETOUCH DELICA PLUS LANCET33G) MISC USE UP TO 3 TIMES DAILY.   lisinopril -hydrochlorothiazide  (ZESTORETIC ) 20-25 MG tablet Take 1 tablet by mouth daily.   metFORMIN  (GLUCOPHAGE ) 1000 MG tablet TAKE 1 TABLET (1,000 MG TOTAL) BY MOUTH TWICE A DAY WITH FOOD   omega-3 acid ethyl esters (LOVAZA ) 1 g capsule TAKE 2 CAPSULES BY MOUTH TWICE A DAY   ONETOUCH VERIO test strip CHECK FASTING BLOOD SUGAR EVERY MORNING AND 2 HOURS AFTER LARGEST MEAL OF THE DAY.,   rosuvastatin  (CRESTOR ) 20  MG tablet Take 1 tablet (20 mg total) by mouth daily.   solifenacin (VESICARE) 5 MG tablet Take 1 tablet (5 mg total) by mouth daily.   tamsulosin  (FLOMAX ) 0.4 MG CAPS capsule Take 1 capsule (0.4 mg total) by mouth 2 (two) times daily.   No facility-administered encounter medications on file as of 05/15/2024.    BP 112/64   Pulse 85   Temp 97.6 F (36.4 C) (Oral)   Ht 5' 1 (1.549 m)   Wt 125 lb 6 oz (56.9 kg)   SpO2 94%   BMI 23.69 kg/m  BP Readings from Last 3 Encounters:  05/15/24 112/64  05/07/24 (!) 172/82  04/25/24 120/68     Physical Exam     04/25/2024   10:22 AM 08/31/2023   12:52 PM 06/01/2023    9:15 AM  Depression screen PHQ 2/9  Decreased Interest 0 0 0  Down, Depressed, Hopeless 0 0 0  PHQ - 2 Score 0 0 0  Altered sleeping 0    Tired, decreased energy 0    Change in appetite 0    Feeling bad or failure about yourself  0    Trouble concentrating 0    Moving slowly or fidgety/restless 0    Suicidal thoughts 0    PHQ-9 Score 0    Difficult doing work/chores Not difficult at all         04/25/2024   10:22 AM  GAD 7 : Generalized Anxiety Score  Nervous, Anxious, on Edge 0  Control/stop worrying 0  Worry too much - different things 0  Trouble relaxing 0  Restless 0  Easily annoyed or irritable 0  Afraid - awful might happen 0  Total GAD 7 Score 0  Anxiety Difficulty Not difficult at all    No results found for any visits on 05/15/24.  {Labs (Optional):23779}  The ASCVD Risk score (Arnett DK, et al., 2019) failed to calculate for the following reasons:   The 2019 ASCVD risk score is only valid for ages 4 to 42    Assessment & Plan:  There are no diagnoses linked to this encounter.   No follow-ups on file.    Harlene Saddler, MD

## 2024-05-16 ENCOUNTER — Other Ambulatory Visit: Payer: Self-pay

## 2024-05-16 ENCOUNTER — Ambulatory Visit: Payer: Self-pay | Admitting: Student

## 2024-05-16 ENCOUNTER — Encounter: Payer: Self-pay | Admitting: Intensive Care

## 2024-05-16 ENCOUNTER — Emergency Department

## 2024-05-16 ENCOUNTER — Inpatient Hospital Stay
Admission: EM | Admit: 2024-05-16 | Discharge: 2024-05-18 | DRG: 682 | Disposition: A | Discharge status: Home / Self Care | Source: Ambulatory Visit | Attending: Hospitalist | Admitting: Hospitalist

## 2024-05-16 DIAGNOSIS — R509 Fever, unspecified: Secondary | ICD-10-CM | POA: Diagnosis not present

## 2024-05-16 DIAGNOSIS — E1122 Type 2 diabetes mellitus with diabetic chronic kidney disease: Secondary | ICD-10-CM | POA: Diagnosis not present

## 2024-05-16 DIAGNOSIS — I739 Peripheral vascular disease, unspecified: Secondary | ICD-10-CM | POA: Diagnosis present

## 2024-05-16 DIAGNOSIS — Z880 Allergy status to penicillin: Secondary | ICD-10-CM

## 2024-05-16 DIAGNOSIS — E785 Hyperlipidemia, unspecified: Secondary | ICD-10-CM | POA: Diagnosis not present

## 2024-05-16 DIAGNOSIS — N1832 Chronic kidney disease, stage 3b: Secondary | ICD-10-CM | POA: Diagnosis not present

## 2024-05-16 DIAGNOSIS — E871 Hypo-osmolality and hyponatremia: Secondary | ICD-10-CM | POA: Diagnosis present

## 2024-05-16 DIAGNOSIS — Z79899 Other long term (current) drug therapy: Secondary | ICD-10-CM | POA: Diagnosis not present

## 2024-05-16 DIAGNOSIS — Z823 Family history of stroke: Secondary | ICD-10-CM | POA: Diagnosis not present

## 2024-05-16 DIAGNOSIS — G4733 Obstructive sleep apnea (adult) (pediatric): Secondary | ICD-10-CM | POA: Diagnosis not present

## 2024-05-16 DIAGNOSIS — E1151 Type 2 diabetes mellitus with diabetic peripheral angiopathy without gangrene: Secondary | ICD-10-CM | POA: Diagnosis not present

## 2024-05-16 DIAGNOSIS — Z7984 Long term (current) use of oral hypoglycemic drugs: Secondary | ICD-10-CM

## 2024-05-16 DIAGNOSIS — J188 Other pneumonia, unspecified organism: Secondary | ICD-10-CM

## 2024-05-16 DIAGNOSIS — J189 Pneumonia, unspecified organism: Secondary | ICD-10-CM | POA: Diagnosis present

## 2024-05-16 DIAGNOSIS — N179 Acute kidney failure, unspecified: Principal | ICD-10-CM | POA: Diagnosis present

## 2024-05-16 DIAGNOSIS — Z1152 Encounter for screening for COVID-19: Secondary | ICD-10-CM | POA: Diagnosis not present

## 2024-05-16 DIAGNOSIS — N400 Enlarged prostate without lower urinary tract symptoms: Secondary | ICD-10-CM | POA: Diagnosis present

## 2024-05-16 DIAGNOSIS — Z7902 Long term (current) use of antithrombotics/antiplatelets: Secondary | ICD-10-CM | POA: Diagnosis not present

## 2024-05-16 DIAGNOSIS — J40 Bronchitis, not specified as acute or chronic: Secondary | ICD-10-CM | POA: Diagnosis not present

## 2024-05-16 DIAGNOSIS — I129 Hypertensive chronic kidney disease with stage 1 through stage 4 chronic kidney disease, or unspecified chronic kidney disease: Secondary | ICD-10-CM | POA: Diagnosis not present

## 2024-05-16 DIAGNOSIS — I10 Essential (primary) hypertension: Secondary | ICD-10-CM | POA: Diagnosis present

## 2024-05-16 DIAGNOSIS — T380X5A Adverse effect of glucocorticoids and synthetic analogues, initial encounter: Secondary | ICD-10-CM | POA: Diagnosis present

## 2024-05-16 DIAGNOSIS — Z7982 Long term (current) use of aspirin: Secondary | ICD-10-CM | POA: Diagnosis not present

## 2024-05-16 DIAGNOSIS — Z833 Family history of diabetes mellitus: Secondary | ICD-10-CM

## 2024-05-16 DIAGNOSIS — J479 Bronchiectasis, uncomplicated: Secondary | ICD-10-CM | POA: Diagnosis not present

## 2024-05-16 DIAGNOSIS — R739 Hyperglycemia, unspecified: Secondary | ICD-10-CM | POA: Diagnosis not present

## 2024-05-16 DIAGNOSIS — N401 Enlarged prostate with lower urinary tract symptoms: Secondary | ICD-10-CM | POA: Diagnosis present

## 2024-05-16 DIAGNOSIS — N3281 Overactive bladder: Secondary | ICD-10-CM | POA: Diagnosis present

## 2024-05-16 DIAGNOSIS — E1165 Type 2 diabetes mellitus with hyperglycemia: Secondary | ICD-10-CM | POA: Diagnosis not present

## 2024-05-16 DIAGNOSIS — R918 Other nonspecific abnormal finding of lung field: Secondary | ICD-10-CM | POA: Diagnosis not present

## 2024-05-16 LAB — BASIC METABOLIC PANEL WITH GFR
Anion gap: 11 (ref 5–15)
BUN/Creatinine Ratio: 31 — ABNORMAL HIGH (ref 10–24)
BUN: 79 mg/dL (ref 8–27)
BUN: 84 mg/dL — ABNORMAL HIGH (ref 8–23)
CO2: 16 mmol/L — ABNORMAL LOW (ref 20–29)
CO2: 19 mmol/L — ABNORMAL LOW (ref 22–32)
Calcium: 8.9 mg/dL (ref 8.6–10.2)
Calcium: 8.9 mg/dL (ref 8.9–10.3)
Chloride: 91 mmol/L — ABNORMAL LOW (ref 96–106)
Chloride: 95 mmol/L — ABNORMAL LOW (ref 98–111)
Creatinine, Ser: 2.58 mg/dL — ABNORMAL HIGH (ref 0.76–1.27)
Creatinine, Ser: 2.72 mg/dL — ABNORMAL HIGH (ref 0.61–1.24)
GFR, Estimated: 23 mL/min — ABNORMAL LOW (ref >60)
Glucose, Bld: 439 mg/dL — ABNORMAL HIGH (ref 70–99)
Glucose: 542 mg/dL (ref 70–99)
Potassium: 4.8 mmol/L (ref 3.5–5.2)
Potassium: 4.1 mmol/L (ref 3.5–5.1)
Sodium: 126 mmol/L — ABNORMAL LOW (ref 134–144)
Sodium: 125 mmol/L — ABNORMAL LOW (ref 135–145)
eGFR: 24 mL/min/1.73 — ABNORMAL LOW (ref >59)

## 2024-05-16 LAB — URINALYSIS, ROUTINE W REFLEX MICROSCOPIC
Bacteria, UA: NONE SEEN
Bilirubin Urine: NEGATIVE
Glucose, UA: >=500 mg/dL — AB
Hgb urine dipstick: NEGATIVE
Ketones, ur: NEGATIVE mg/dL
Leukocytes,Ua: NEGATIVE
Nitrite: NEGATIVE
Protein, ur: 30 mg/dL — AB
Specific Gravity, Urine: 1.014 (ref 1.005–1.030)
pH: 5 (ref 5.0–8.0)

## 2024-05-16 LAB — CBC
HCT: 33.3 % — ABNORMAL LOW (ref 39.0–52.0)
Hemoglobin: 11.4 g/dL — ABNORMAL LOW (ref 13.0–17.0)
MCH: 28.1 pg (ref 26.0–34.0)
MCHC: 34.2 g/dL (ref 30.0–36.0)
MCV: 82 fL (ref 80.0–100.0)
Platelets: 524 K/uL — ABNORMAL HIGH (ref 150–400)
RBC: 4.06 MIL/uL — ABNORMAL LOW (ref 4.22–5.81)
RDW: 14.6 % (ref 11.5–15.5)
WBC: 7.2 K/uL (ref 4.0–10.5)
nRBC: 0 % (ref 0.0–0.2)

## 2024-05-16 LAB — PROCALCITONIN: Procalcitonin: 2.02 ng/mL

## 2024-05-16 LAB — GLUCOSE, CAPILLARY
Glucose-Capillary: 394 mg/dL — ABNORMAL HIGH (ref 70–99)
Glucose-Capillary: 384 mg/dL — ABNORMAL HIGH (ref 70–99)

## 2024-05-16 LAB — STREP PNEUMONIAE URINARY ANTIGEN: Strep Pneumo Urinary Antigen: NEGATIVE

## 2024-05-16 LAB — RESP PANEL BY RT-PCR (RSV, FLU A&B, COVID)  RVPGX2
Influenza A by PCR: NEGATIVE
Influenza B by PCR: NEGATIVE
Resp Syncytial Virus by PCR: NEGATIVE
SARS Coronavirus 2 by RT PCR: NEGATIVE

## 2024-05-16 LAB — CBG MONITORING, ED
Glucose-Capillary: 451 mg/dL — ABNORMAL HIGH (ref 70–99)
Glucose-Capillary: 370 mg/dL — ABNORMAL HIGH (ref 70–99)

## 2024-05-16 MED ORDER — ROSUVASTATIN CALCIUM 20 MG PO TABS
20.0000 mg | ORAL_TABLET | Freq: Every day | ORAL | Status: DC
Start: 2024-05-17 — End: 2024-05-18
  Administered 2024-05-17 – 2024-05-18 (×2): 20 mg via ORAL
  Filled 2024-05-16 (×2): qty 1

## 2024-05-16 MED ORDER — BENZONATATE 100 MG PO CAPS
200.0000 mg | ORAL_CAPSULE | ORAL | Status: DC | PRN
  Administered 2024-05-17: 200 mg via ORAL
  Filled 2024-05-16: qty 2

## 2024-05-16 MED ORDER — ASPIRIN 81 MG PO TBEC
81.0000 mg | DELAYED_RELEASE_TABLET | Freq: Every day | ORAL | Status: DC
Start: 2024-05-16 — End: 2024-05-18
  Administered 2024-05-16 – 2024-05-17 (×2): 81 mg via ORAL
  Filled 2024-05-16 (×2): qty 1

## 2024-05-16 MED ORDER — ACETAMINOPHEN 325 MG PO TABS: 650.0000 mg | ORAL_TABLET | Freq: Four times a day (QID) | ORAL | Status: DC | PRN

## 2024-05-16 MED ORDER — INSULIN ASPART 100 UNIT/ML IJ SOLN
6.0000 [IU] | Freq: Once | INTRAMUSCULAR | Status: AC
  Administered 2024-05-16: 6 [IU] via INTRAVENOUS
  Filled 2024-05-16: qty 6

## 2024-05-16 MED ORDER — OXYCODONE HCL 5 MG PO TABS: 5.0000 mg | ORAL_TABLET | ORAL | Status: DC | PRN

## 2024-05-16 MED ORDER — TAMSULOSIN HCL 0.4 MG PO CAPS
0.4000 mg | ORAL_CAPSULE | Freq: Two times a day (BID) | ORAL | Status: DC
  Administered 2024-05-16 – 2024-05-18 (×4): 0.4 mg via ORAL
  Filled 2024-05-16 (×4): qty 1

## 2024-05-16 MED ORDER — POLYETHYLENE GLYCOL 3350 17 G PO PACK: 17.0000 g | PACK | Freq: Every day | ORAL | Status: DC | PRN

## 2024-05-16 MED ORDER — ACETAMINOPHEN 650 MG RE SUPP: 650.0000 mg | Freq: Four times a day (QID) | RECTAL | Status: DC | PRN

## 2024-05-16 MED ORDER — ONDANSETRON HCL 4 MG PO TABS: 4.0000 mg | ORAL_TABLET | Freq: Four times a day (QID) | ORAL | Status: DC | PRN

## 2024-05-16 MED ORDER — INSULIN ASPART 100 UNIT/ML IJ SOLN
0.0000 [IU] | Freq: Every day | INTRAMUSCULAR | Status: DC
  Administered 2024-05-16: 5 [IU] via SUBCUTANEOUS
  Filled 2024-05-16: qty 1

## 2024-05-16 MED ORDER — CLOPIDOGREL BISULFATE 75 MG PO TABS
75.0000 mg | ORAL_TABLET | Freq: Every day | ORAL | Status: DC
  Administered 2024-05-16 – 2024-05-18 (×3): 75 mg via ORAL
  Filled 2024-05-16 (×3): qty 1

## 2024-05-16 MED ORDER — SODIUM CHLORIDE 0.9 % IV BOLUS
1000.0000 mL | Freq: Once | INTRAVENOUS | Status: AC
  Administered 2024-05-16: 1000 mL via INTRAVENOUS

## 2024-05-16 MED ORDER — MORPHINE SULFATE (PF) 2 MG/ML IV SOLN: 2.0000 mg | INTRAVENOUS | Status: DC | PRN

## 2024-05-16 MED ORDER — FENOFIBRATE 160 MG PO TABS
160.0000 mg | ORAL_TABLET | Freq: Every day | ORAL | Status: DC
  Administered 2024-05-17 – 2024-05-18 (×2): 160 mg via ORAL
  Filled 2024-05-16 (×3): qty 1

## 2024-05-16 MED ORDER — SODIUM CHLORIDE 0.9 % IV SOLN
1.0000 g | Freq: Once | INTRAVENOUS | Status: AC
  Administered 2024-05-16: 1 g via INTRAVENOUS
  Filled 2024-05-16: qty 10

## 2024-05-16 MED ORDER — INSULIN ASPART 100 UNIT/ML IJ SOLN
0.0000 [IU] | Freq: Three times a day (TID) | INTRAMUSCULAR | Status: DC
  Administered 2024-05-16: 9 [IU] via SUBCUTANEOUS
  Administered 2024-05-17 (×2): 2 [IU] via SUBCUTANEOUS
  Administered 2024-05-17: 1 [IU] via SUBCUTANEOUS
  Administered 2024-05-18: 3 [IU] via SUBCUTANEOUS
  Filled 2024-05-16 (×5): qty 1

## 2024-05-16 MED ORDER — SODIUM CHLORIDE 0.9 % IV SOLN
2.0000 g | INTRAVENOUS | Status: DC
  Administered 2024-05-17: 2 g via INTRAVENOUS
  Filled 2024-05-16: qty 20

## 2024-05-16 MED ORDER — ENOXAPARIN SODIUM 30 MG/0.3ML IJ SOSY: 30.0000 mg | PREFILLED_SYRINGE | Freq: Every day | INTRAMUSCULAR | Status: DC

## 2024-05-16 MED ORDER — INSULIN GLARGINE 100 UNIT/ML ~~LOC~~ SOLN
10.0000 [IU] | Freq: Every day | SUBCUTANEOUS | Status: DC
  Administered 2024-05-16: 10 [IU] via SUBCUTANEOUS
  Filled 2024-05-16 (×3): qty 0.1

## 2024-05-16 MED ORDER — HEPARIN SODIUM (PORCINE) 5000 UNIT/ML IJ SOLN
5000.0000 [IU] | Freq: Three times a day (TID) | INTRAMUSCULAR | Status: DC
  Administered 2024-05-16 – 2024-05-18 (×4): 5000 [IU] via SUBCUTANEOUS
  Filled 2024-05-16 (×4): qty 1

## 2024-05-16 MED ORDER — SODIUM CHLORIDE 0.9 % IV SOLN
500.0000 mg | Freq: Once | INTRAVENOUS | Status: DC
  Filled 2024-05-16: qty 5

## 2024-05-16 MED ORDER — SODIUM CHLORIDE 0.9 % IV SOLN
500.0000 mg | INTRAVENOUS | Status: DC
  Administered 2024-05-16: 500 mg via INTRAVENOUS
  Filled 2024-05-16: qty 5

## 2024-05-16 MED ORDER — FERROUS SULFATE 325 (65 FE) MG PO TABS
325.0000 mg | ORAL_TABLET | Freq: Three times a day (TID) | ORAL | Status: DC
  Administered 2024-05-16 – 2024-05-18 (×5): 325 mg via ORAL
  Filled 2024-05-16 (×5): qty 1

## 2024-05-16 MED ORDER — SODIUM CHLORIDE 0.9 % IV SOLN: INTRAVENOUS | Status: DC

## 2024-05-16 MED ORDER — ONDANSETRON HCL 4 MG/2ML IJ SOLN: 4.0000 mg | Freq: Four times a day (QID) | INTRAMUSCULAR | Status: DC | PRN

## 2024-05-16 MED ORDER — GABAPENTIN 300 MG PO CAPS
300.0000 mg | ORAL_CAPSULE | Freq: Every day | ORAL | Status: DC
  Administered 2024-05-16 – 2024-05-17 (×2): 300 mg via ORAL
  Filled 2024-05-16 (×2): qty 1

## 2024-05-16 NOTE — Telephone Encounter (Signed)
 Called spoke to spouse about lab results. Discussed elevated glucose and AKI, concern for DKA in the setting of recent lung infection and need to return to the ED. Likely need fluids and insulin . Spouse understand and will bring patient to ED.

## 2024-05-16 NOTE — Progress Notes (Signed)
 PHARMACIST - PHYSICIAN COMMUNICATION  CONCERNING:  Enoxaparin  (Lovenox ) for DVT Prophylaxis    RECOMMENDATION: Patient was prescribed enoxaprin 40mg  q24 hours for VTE prophylaxis.   Filed Weights   05/16/24 1151  Weight: 56.9 kg (125 lb 6 oz)    Body mass index is 23.69 kg/m.  Estimated Creatinine Clearance: 16 mL/min (A) (by C-G formula based on SCr of 2.72 mg/dL (H)).  Patient is candidate for enoxaparin  30mg  every 24 hours based on CrCl <82ml/min or Weight <45kg  DESCRIPTION: Pharmacy has adjusted enoxaparin  dose per [facility] policy.  Patient is now receiving enoxaparin  30 mg every 24 hours    Michael Thomas, PharmD Clinical Pharmacist  05/16/2024 4:21 PM

## 2024-05-16 NOTE — H&P (Signed)
 History and Physical    Michael Thomas FMW:[id number] DOB: 1945/12/31 DOA: 05/16/2024  DOS: the patient was seen and examined on 05/16/2024  PCP: Lemon Raisin, MD   Patient coming from: Home  I have personally briefly reviewed patient's old medical records in [facility]  Chief Complaint: Not feeling well/AKI  HPI: Michael Thomas is a pleasant 78 y.o. male with medical history significant for intermittent claudication in bilateral lower extremities, diabetes, HTN, HLD, PVD with RLE critical limb ischemia, BPH who was brought in when he has labs drawn on 05/15/2024 were abnormal.  There was abnormally high sugar, AKI and concern for DKA and patient was advised to come into ED for evaluation. Patient stated that he was not feeling well for the last few days.  He went to see his primary care doctor where labs were drawn and called that he has abnormal labs.  He also feels weak, not feeling well and tired for whole week.  He also stated that he had some fever but did not measure the temperature.  He feels nauseated but denied any vomiting, diarrhea, chest pain, palpitations.  Patient stated that today he felt weak to walk to the bathroom from bedroom and he felt lightheaded on standing.  But he did not pass out.  ED Course: Upon arrival to the ED, patient is found to be in AKI with creatinine of 2.72, BUN 84, blood glucose 439, sodium 135, bicarb 19 anion gap was 11.  Patient also was found to have multifocal pneumonia.  Hospitalist service was consulted for evaluation for admission for pneumonia, AKI, hyperglycemia.  Review of Systems:  ROS  All other systems negative except as noted in the HPI.  Past Medical History:  Diagnosis Date   Benign prostatic hyperplasia with urinary obstruction 03/25/2015   Carpal tunnel syndrome on left 12/25/2017   Chronic kidney disease    Critical limb ischemia of right lower extremity (HCC) 06/15/2023   Diabetes mellitus without  complication (HCC)    History of kidney stones    Hyperlipidemia    Hypertension    OSA (obstructive sleep apnea)    Peripheral arterial disease    Pyelonephritis of left kidney 04/22/2022   Vitamin D deficiency     Past Surgical History:  Procedure Laterality Date   ABDOMINAL AORTOGRAM N/A 03/15/2024   Procedure: ABDOMINAL AORTOGRAM;  Surgeon: Magda Debby SAILOR, MD;  Location: [facility];  Service: Cardiovascular;  Laterality: N/A;   ABDOMINAL AORTOGRAM W/LOWER EXTREMITY N/A 05/26/2023   Procedure: ABDOMINAL AORTOGRAM W/LOWER EXTREMITY;  Surgeon: Magda Debby SAILOR, MD;  Location: [facility];  Service: Cardiovascular;  Laterality: N/A;   ABDOMINAL AORTOGRAM W/LOWER EXTREMITY N/A 07/21/2023   Procedure: ABDOMINAL AORTOGRAM W/LOWER EXTREMITY;  Surgeon: Magda Debby SAILOR, MD;  Location: [facility];  Service: Cardiovascular;  Laterality: N/A;   BIOPSY  08/07/2023   Procedure: BIOPSY;  Surgeon: Maryruth Ole DASEN, MD;  Location: [facility] END[facility];  Service: Endoscopy;;   COLONOSCOPY     COLONOSCOPY WITH PROPOFOL  N/A 08/07/2023   Procedure: COLONOSCOPY WITH PROPOFOL ;  Surgeon: Maryruth Ole DASEN, MD;  Location: [facility] END[facility]COPY;  Service: Endoscopy;  Laterality: N/A;   DG ARTHRO THUMB*L*     ENDARTERECTOMY FEMORAL Right 06/15/2023   Procedure: RIGHT COMMON FEMORAL ENDARTERECTOMY;  Surgeon: Magda Debby SAILOR, MD;  Location: [facility] OR;  Service: Vascular;  Laterality: Right;   ESOPHAGOGASTRODUODENOSCOPY (EGD) WITH PROPOFOL  N/A 08/07/2023   Procedure: ESOPHAGOGASTRODUODENOSCOPY (EGD) WITH PROPOFOL ;  Surgeon: Maryruth Ole  T, MD;  Location: [facility] ENDOSCOPY;  Service: Endoscopy;  Laterality: N/A;   INTRAOPERATIVE ARTERIOGRAM Right 06/15/2023   Procedure: INTRA OPERATIVE RIGHT LOWER EXTREMITY  ARTERIOGRAM;  Surgeon: Magda Debby SAILOR, MD;  Location: [facility] OR;  Service: Vascular;  Laterality: Right;   IR ANGIOGRAM EXTREMITY BILATERAL  02/20/2018   IR ANGIOGRAM EXTREMITY BILATERAL   04/05/2019   IR ANGIOGRAM EXTREMITY LEFT  05/07/2020   IR ANGIOGRAM EXTREMITY LEFT  10/13/2022   IR ANGIOGRAM EXTREMITY RIGHT  02/15/2021   IR ANGIOGRAM EXTREMITY RIGHT  12/14/2021   IR ANGIOGRAM SELECTIVE EACH ADDITIONAL VESSEL  02/20/2018   IR ANGIOGRAM SELECTIVE EACH ADDITIONAL VESSEL  10/13/2022   IR FEM POP ART ATHERECT INC PTA MOD SED  02/20/2018   IR FEM POP ART ATHERECT INC PTA MOD SED  05/14/2019   IR FEM POP ART ATHERECT INC PTA MOD SED  05/07/2020   IR FEM POP ART ATHERECT INC PTA MOD SED  02/15/2021   IR FEM POP ART STENT INC PTA MOD SED  12/14/2021   IR ILIAC ART STENT INC PTA MOD SED  10/13/2022   IR INTRAVASCULAR ULTRASOUND NON CORONARY  02/15/2021   IR INTRAVASCULAR ULTRASOUND NON CORONARY  12/14/2021   IR INTRAVASCULAR ULTRASOUND NON CORONARY  10/13/2022   IR RADIOLOGIST EVAL & MGMT  02/01/2018   IR RADIOLOGIST EVAL & MGMT  03/20/2018   IR RADIOLOGIST EVAL & MGMT  07/25/2018   IR RADIOLOGIST EVAL & MGMT  03/07/2019   IR RADIOLOGIST EVAL & MGMT  04/23/2019   IR RADIOLOGIST EVAL & MGMT  06/18/2019   IR RADIOLOGIST EVAL & MGMT  04/29/2020   IR RADIOLOGIST EVAL & MGMT  06/04/2020   IR RADIOLOGIST EVAL & MGMT  01/20/2021   IR RADIOLOGIST EVAL & MGMT  04/27/2021   IR RADIOLOGIST EVAL & MGMT  08/12/2021   IR RADIOLOGIST EVAL & MGMT  11/02/2021   IR RADIOLOGIST EVAL & MGMT  11/15/2021   IR RADIOLOGIST EVAL & MGMT  01/26/2022   IR RADIOLOGIST EVAL & MGMT  03/23/2022   IR RADIOLOGIST EVAL & MGMT  09/23/2022   IR RADIOLOGIST EVAL & MGMT  11/25/2022   IR RADIOLOGIST EVAL & MGMT  03/22/2023   IR US  GUIDE VASC ACCESS LEFT  04/05/2019   IR US  GUIDE VASC ACCESS LEFT  02/15/2021   IR US  GUIDE VASC ACCESS LEFT  12/14/2021   IR US  GUIDE VASC ACCESS RIGHT  02/20/2018   IR US  GUIDE VASC ACCESS RIGHT  05/14/2019   IR US  GUIDE VASC ACCESS RIGHT  05/07/2020   IR US  GUIDE VASC ACCESS RIGHT  10/13/2022   LOWER EXTREMITY ANGIOGRAPHY N/A 03/15/2024   Procedure: Lower Extremity  Angiography;  Surgeon: Magda Debby SAILOR, MD;  Location: [facility] INVASIVE CV LAB;  Service: Cardiovascular;  Laterality: N/A;   LOWER EXTREMITY INTERVENTION N/A 03/15/2024   Procedure: LOWER EXTREMITY INTERVENTION;  Surgeon: Magda Debby SAILOR, MD;  Location: [facility];  Service: Cardiovascular;  Laterality: N/A;   NECK SURGERY     PATCH ANGIOPLASTY Right 06/15/2023   Procedure: PATCH ANGIOPLASTY USING [name] LINTS;  Surgeon: Magda Debby SAILOR, MD;  Location: [facility] OR;  Service: Vascular;  Laterality: Right;   PERIPHERAL VASCULAR BALLOON ANGIOPLASTY  07/21/2023   Procedure: PERIPHERAL VASCULAR BALLOON ANGIOPLASTY;  Surgeon: Magda Debby SAILOR, MD;  Location: [facility];  Service: Cardiovascular;;   PERIPHERAL VASCULAR INTERVENTION  07/21/2023   Procedure: PERIPHERAL VASCULAR INTERVENTION;  Surgeon: Magda Debby SAILOR, MD;  Location: [facility]  INVASIVE CV LAB;  Service: Cardiovascular;;   VASECTOMY       reports that he has never smoked. He has never used smokeless tobacco. He reports current alcohol use. He reports that he does not use drugs.  Allergies  Allergen Reactions   Amoxicillin  Other (See Comments)    Fatigue and Myalgia and Rigors x2 attempts    Family History  Problem Relation Age of Onset   Stroke Mother    Diabetes Mellitus II Mother    Prostate cancer Neg Hx    Kidney cancer Neg Hx    Bladder Cancer Neg Hx     Prior to Admission medications   Medication Sig Start Date End Date Taking? Authorizing Provider  AMBULATORY NON FORMULARY MEDICATION One Touch Ultra 2 glucometer 05/15/20   Curtis Debby PARAS, MD  aspirin  EC 81 MG tablet Take 81 mg by mouth at bedtime.    [provider]  benzonatate  (TESSALON ) 200 MG capsule Take 200 mg by mouth. 05/11/24 05/18/24  [provider]  Blood Glucose Monitoring Suppl (ONETOUCH VERIO REFLECT) w/Device KIT Check fasting blood sugar every morning and 2 hours after largest meal of the day., 11/22/21   Curtis Debby PARAS, MD  clobetasol  ointment (TEMOVATE ) 0.05 % APPLY TOPICALLY TO AFFECTED AREA(S) TWICE DAILY AS NEEDED 03/11/24   Curtis Debby PARAS, MD  clopidogrel  (PLAVIX ) 75 MG tablet Take 1 tablet (75 mg total) by mouth daily. 07/21/23   Magda Debby SAILOR, MD  cyanocobalamin  1000 MCG tablet Take 1 tablet (1,000 mcg total) by mouth daily. 08/09/23   Caleen Qualia, MD  empagliflozin  (JARDIANCE ) 10 MG TABS tablet Take 1 tablet (10 mg total) by mouth daily. 05/15/24   Lemon Raisin, MD  fenofibrate  160 MG tablet TAKE 1 TABLET BY MOUTH EVERY DAY 10/16/23   Thekkekandam, Thomas J, MD  ferrous sulfate  325 (65 FE) MG EC tablet Take 1 tablet (325 mg total) by mouth 3 (three) times daily with meals. 08/22/23   Curtis Debby PARAS, MD  gabapentin  (NEURONTIN ) 300 MG capsule Take 1 capsule (300 mg total) by mouth at bedtime. 04/22/24   Alvia Bring, DO  glipiZIDE  (GLUCOTROL  XL) 10 MG 24 hr tablet TAKE 1 TABLET BY MOUTH EVERY DAY WITH BREAKFAST 04/12/24   Crain, Whitney L, PA  JANUVIA  100 MG tablet Take 100 mg by mouth at bedtime. 03/04/24   [provider]  Lancets (ONETOUCH DELICA PLUS LANCET33G) MISC USE UP TO 3 TIMES DAILY. 09/05/23   Curtis Debby PARAS, MD  lisinopril -hydrochlorothiazide  (ZESTORETIC ) 20-25 MG tablet Take 1 tablet by mouth daily. 04/25/24   Lemon Raisin, MD  metFORMIN  (GLUCOPHAGE ) 1000 MG tablet TAKE 1 TABLET (1,000 MG TOTAL) BY MOUTH TWICE A DAY WITH FOOD 01/11/24   Curtis Debby PARAS, MD  moxifloxacin (AVELOX) 400 MG tablet Take 400 mg by mouth. 05/11/24 05/21/24  [provider]  omega-3 acid ethyl esters (LOVAZA ) 1 g capsule TAKE 2 CAPSULES BY MOUTH TWICE A DAY 04/25/23   Curtis Debby PARAS, MD  ONETOUCH VERIO test strip CHECK FASTING BLOOD SUGAR EVERY MORNING AND 2 HOURS AFTER LARGEST MEAL OF THE DAY., 02/29/24   Curtis Debby PARAS, MD  predniSONE  (DELTASONE ) 10 MG tablet Take 10 mg by mouth daily with breakfast. 05/11/24 05/16/24  [provider]  rosuvastatin   (CRESTOR ) 20 MG tablet Take 1 tablet (20 mg total) by mouth daily. 04/25/24   Lemon Raisin, MD  solifenacin (VESICARE) 5 MG tablet Take 1 tablet (5 mg total) by mouth daily. 01/11/23  Curtis Debby PARAS, MD  tamsulosin  (FLOMAX ) 0.4 MG CAPS capsule Take 1 capsule (0.4 mg total) by mouth 2 (two) times daily. 08/04/23   Curtis Debby PARAS, MD    Physical Exam: Vitals:   05/16/24 1428 05/16/24 1430 05/16/24 1500 05/16/24 1507  BP: (!) 151/66 (!) 147/56 133/66 133/66  Pulse: (!) 56 (!) 51 (!) 55 (!) 55  Resp:    15  Temp:    97.7 F (36.5 C)  TempSrc:    Oral  SpO2: 99% 99% 99% 99%  Weight:      Height:        Physical Exam   Constitutional: Alert, awake, calm, comfortable HEENT: Neck supple Respiratory: Clear to auscultation B/L, no wheezing, no rales.  Cardiovascular: Regular rate and rhythm, no murmurs / rubs / gallops. No extremity edema. 2+ pedal pulses. No carotid bruits.  Abdomen: Soft, no tenderness, Bowel sounds positive.  Musculoskeletal: no clubbing / cyanosis. Good ROM, no contractures. Normal muscle tone.  Skin: no rashes, lesions, ulcers. Neurologic: CN 2-12 grossly intact. Sensation intact, No focal deficit identified Psychiatric: Alert and oriented x 3. Normal mood.    Labs on Admission: I have personally reviewed following labs and imaging studies  CBC: Recent Labs  Lab 05/16/24 1154  WBC 7.2  HGB 11.4*  HCT 33.3*  MCV 82.0  PLT 524*   Basic Metabolic Panel: Recent Labs  Lab 05/15/24 1200 05/16/24 1154  NA 126* 125*  K 4.8 4.1  CL 91* 95*  CO2 16* 19*  GLUCOSE 542* 439*  BUN 79* 84*  CREATININE 2.58* 2.72*  CALCIUM  8.9 8.9   GFR: Estimated Creatinine Clearance: 16 mL/min (A) (by C-G formula based on SCr of 2.72 mg/dL (H)). Liver Function Tests: No results for input(s): AST, ALT, ALKPHOS, BILITOT, PROT, ALBUMIN  in the last 168 hours. No results for input(s): LIPASE, AMYLASE in the last 168 hours. No results for input(s):  AMMONIA in the last 168 hours. Coagulation Profile: No results for input(s): INR, PROTIME in the last 168 hours. Cardiac Enzymes: No results for input(s): CKTOTAL, CKMB, CKMBINDEX, TROPONINI, TROPONINIHS in the last 168 hours. BNP (last 3 results) No results for input(s): BNP in the last 8760 hours. HbA1C: No results for input(s): HGBA1C in the last 72 hours. CBG: Recent Labs  Lab 05/16/24 1424  GLUCAP 451*   Lipid Profile: No results for input(s): CHOL, HDL, LDLCALC, TRIG, CHOLHDL, LDLDIRECT in the last 72 hours. Thyroid  Function Tests: No results for input(s): TSH, T4TOTAL, FREET4, T3FREE, THYROIDAB in the last 72 hours. Anemia Panel: No results for input(s): VITAMINB12, FOLATE, FERRITIN, TIBC, IRON , RETICCTPCT in the last 72 hours. Urine analysis:    Component Value Date/Time   COLORURINE STRAW (A) 06/08/2023 1503   APPEARANCEUR CLEAR 06/08/2023 1503   APPEARANCEUR Clear 04/30/2015 1427   LABSPEC 1.012 06/08/2023 1503   PHURINE 7.0 06/08/2023 1503   GLUCOSEU NEGATIVE 06/08/2023 1503   HGBUR NEGATIVE 06/08/2023 1503   BILIRUBINUR NEGATIVE 06/08/2023 1503   BILIRUBINUR Negative 04/30/2015 1427   KETONESUR NEGATIVE 06/08/2023 1503   PROTEINUR NEGATIVE 06/08/2023 1503   NITRITE NEGATIVE 06/08/2023 1503   LEUKOCYTESUR NEGATIVE 06/08/2023 1503    Radiological Exams on Admission: I have personally reviewed images DG Chest Portable 1 View Result Date: 05/16/2024 CLINICAL DATA:  Shortness of breath, cough, and weakness. Unwell feeling beginning on Friday. Chills, fever, and fatigue. Hyperglycemia. History of diabetes. EXAM: PORTABLE CHEST 1 VIEW COMPARISON:  12/19/2023 FINDINGS: Heart size and pulmonary vascularity are normal. Bronchiectasis and  central peribronchial thickening with suggestion of developing patchy infiltrates in the lung bases. This may represent developing multifocal pneumonia superimposed on to bronchitis or  bronchitic changes. No pleural effusion or pneumothorax. Mediastinal contours appear intact. Postoperative changes in the cervical spine. IMPRESSION: 1. Bronchiectasis and bronchitic changes in the lungs, likely chronic. 2. Patchy infiltrates suggested in the lower lungs likely indicating superimposed developing multifocal pneumonia. Electronically Signed   By: Elsie Gravely M.D.   On: 05/16/2024 15:33    EKG: My personal interpretation of EKG shows: Sinus rhythm with PVCs    Assessment/Plan Principal Problem:   CAP (community acquired pneumonia) Active Problems:   Essential hypertension, benign   Hyperlipidemia   BPH (benign prostatic hyperplasia) with detrusor instability/OAB   Type 2 diabetes mellitus with diabetic chronic kidney disease (HCC)   Obstructive apnea   PAD (peripheral artery disease)   AKI (acute kidney injury)    Assessment and Plan: 78 year old male double/PMH of DM, HTN, HLD, OSA, PAD who was brought into ED after abnormal lab from outpatient clinic.  He also complained of tiredness.  He was found to have AKI, hyperglycemia and pneumonia.  1.  Feeling tired, AKI - He will be admitted to hospital as inpatient - He has AKI.  His kidney function normal is less than 2 and today it is 2.7. - He will be given IV fluid and monitor kidney function  2.  Community-acquired pneumonia - He was given antibiotics in the emergency room cefepime - I do not see the reason to continue cefepime. - I will continue as community-acquired pneumonia with ceftriaxone  and azithromycin . - Continue to follow the cultures, Legionella and histo antigen  3.  Uncontrolled diabetes without DKA - Patient was not feeling well and not checking blood sugars. - He does not take insulin . - He will be given IV fluid as mentioned above, insulin  sliding scale and I will add small dose of Lantus to 10 units at bedtime. - Continue to monitor blood sugars  4.  HTN/HLD/PAD - Resume home  medications - Monitor blood pressure  5.  Pseudohyponatremia - Sodium level is 125 with blood sugars of 439 - This may be combination due to not eating well and nauseated and vomiting and hyperglycemia - With IV fluid normal saline will get corrected. - Will check sodium level     DVT prophylaxis: SQ Heparin  Code Status: Full Code Family Communication: None  Disposition Plan: Home  Consults called: None  Admission status: Inpatient, Telemetry bed   Nena Rebel, MD [facility] 05/16/2024, 4:49 PM

## 2024-05-16 NOTE — Assessment & Plan Note (Signed)
 Recently elevated glucoses likely due  to oral prednisone . Is off Jardiance  as he ran out. Will have him stop prednisone  and respiratory symptoms are improving. BMP today.

## 2024-05-16 NOTE — ED Provider Notes (Signed)
 [facility] Provider Note    Event Date/Time   First MD Initiated Contact with Patient 05/16/24 1354     (approximate)   History   Chief Complaint: Hyperglycemia and Shortness of Breath   HPI  Michael Thomas is a 78 y.o. male with a history of diabetes, hypertension, BPH who comes to the ED due to malaise, fatigue for the past week, associated chills and fever.  Today he felt too weak to walk from bedroom to bathroom.  Lightheaded with standing.        Past Medical History:  Diagnosis Date   Benign prostatic hyperplasia with urinary obstruction 03/25/2015   Carpal tunnel syndrome on left 12/25/2017   Chronic kidney disease    Critical limb ischemia of right lower extremity (HCC) 06/15/2023   Diabetes mellitus without complication (HCC)    History of kidney stones    Hyperlipidemia    Hypertension    OSA (obstructive sleep apnea)    Peripheral arterial disease    Pyelonephritis of left kidney 04/22/2022   Vitamin D deficiency     Current Outpatient Rx   Order #: [id number] Class: Print   Order #: [id number] Class: Historical Med   Order #: [id number] Class: Historical Med   Order #: [id number] Class: Normal   Order #: [id number] Class: Normal   Order #: [id number] Class: Normal   Order #: [id number] Class: Normal   Order #: [id number] Class: Normal   Order #: [id number] Class: Normal   Order #: [id number] Class: Normal   Order #: [id number] Class: Normal   Order #: [id number] Class: Normal   Order #: [id number] Class: Historical Med   Order #: [id number] Class: Normal   Order #: [id number] Class: No Print   Order #: [id number] Class: Normal   Order #: [id number] Class: Historical Med   Order #: [id number] Class: Normal   Order #: [id number] Class: Normal   Order #: [id number] Class: Historical Med   Order #: [id number] Class: Normal   Order #: [id number] Class: Historical Med   Order #: [id number] Class: Normal    Past Surgical History:  Procedure Laterality Date    ABDOMINAL AORTOGRAM N/A 03/15/2024   Procedure: ABDOMINAL AORTOGRAM;  Surgeon: Magda Debby SAILOR, MD;  Location: [facility] INVASIVE CV LAB;  Service: Cardiovascular;  Laterality: N/A;   ABDOMINAL AORTOGRAM W/LOWER EXTREMITY N/A 05/26/2023   Procedure: ABDOMINAL AORTOGRAM W/LOWER EXTREMITY;  Surgeon: Magda Debby SAILOR, MD;  Location: [facility] INVASIVE CV LAB;  Service: Cardiovascular;  Laterality: N/A;   ABDOMINAL AORTOGRAM W/LOWER EXTREMITY N/A 07/21/2023   Procedure: ABDOMINAL AORTOGRAM W/LOWER EXTREMITY;  Surgeon: Magda Debby SAILOR, MD;  Location: [facility] INVASIVE CV LAB;  Service: Cardiovascular;  Laterality: N/A;   BIOPSY  08/07/2023   Procedure: BIOPSY;  Surgeon: Maryruth Ole DASEN, MD;  Location: [facility] ENDOSCOPY;  Service: Endoscopy;;   COLONOSCOPY     COLONOSCOPY WITH PROPOFOL  N/A 08/07/2023   Procedure: COLONOSCOPY WITH PROPOFOL ;  Surgeon: Maryruth Ole DASEN, MD;  Location: [facility] ENDOSCOPY;  Service: Endoscopy;  Laterality: N/A;   DG ARTHRO THUMB*L*     ENDARTERECTOMY FEMORAL Right 06/15/2023   Procedure: RIGHT COMMON FEMORAL ENDARTERECTOMY;  Surgeon: Magda Debby SAILOR, MD;  Location: [facility] OR;  Service: Vascular;  Laterality: Right;   ESOPHAGOGASTRODUODENOSCOPY (EGD) WITH PROPOFOL  N/A 08/07/2023   Procedure: ESOPHAGOGASTRODUODENOSCOPY (EGD) WITH PROPOFOL ;  Surgeon: Maryruth Ole DASEN, MD;  Location: [facility] ENDOSCOPY;  Service: Endoscopy;  Laterality: N/A;   INTRAOPERATIVE ARTERIOGRAM Right 06/15/2023   Procedure: INTRA OPERATIVE RIGHT LOWER EXTREMITY  ARTERIOGRAM;  Surgeon: Magda Debby SAILOR, MD;  Location: [facility] OR;  Service: Vascular;  Laterality: Right;  IR ANGIOGRAM EXTREMITY BILATERAL  02/20/2018   IR ANGIOGRAM EXTREMITY BILATERAL  04/05/2019   IR ANGIOGRAM EXTREMITY LEFT  05/07/2020   IR ANGIOGRAM EXTREMITY LEFT  10/13/2022   IR ANGIOGRAM EXTREMITY RIGHT  02/15/2021   IR ANGIOGRAM EXTREMITY RIGHT  12/14/2021   IR ANGIOGRAM SELECTIVE EACH ADDITIONAL VESSEL  02/20/2018   IR ANGIOGRAM SELECTIVE EACH ADDITIONAL  VESSEL  10/13/2022   IR FEM POP ART ATHERECT INC PTA MOD SED  02/20/2018   IR FEM POP ART ATHERECT INC PTA MOD SED  05/14/2019   IR FEM POP ART ATHERECT INC PTA MOD SED  05/07/2020   IR FEM POP ART ATHERECT INC PTA MOD SED  02/15/2021   IR FEM POP ART STENT INC PTA MOD SED  12/14/2021   IR ILIAC ART STENT INC PTA MOD SED  10/13/2022   IR INTRAVASCULAR ULTRASOUND NON CORONARY  02/15/2021   IR INTRAVASCULAR ULTRASOUND NON CORONARY  12/14/2021   IR INTRAVASCULAR ULTRASOUND NON CORONARY  10/13/2022   IR RADIOLOGIST EVAL & MGMT  02/01/2018   IR RADIOLOGIST EVAL & MGMT  03/20/2018   IR RADIOLOGIST EVAL & MGMT  07/25/2018   IR RADIOLOGIST EVAL & MGMT  03/07/2019   IR RADIOLOGIST EVAL & MGMT  04/23/2019   IR RADIOLOGIST EVAL & MGMT  06/18/2019   IR RADIOLOGIST EVAL & MGMT  04/29/2020   IR RADIOLOGIST EVAL & MGMT  06/04/2020   IR RADIOLOGIST EVAL & MGMT  01/20/2021   IR RADIOLOGIST EVAL & MGMT  04/27/2021   IR RADIOLOGIST EVAL & MGMT  08/12/2021   IR RADIOLOGIST EVAL & MGMT  11/02/2021   IR RADIOLOGIST EVAL & MGMT  11/15/2021   IR RADIOLOGIST EVAL & MGMT  01/26/2022   IR RADIOLOGIST EVAL & MGMT  03/23/2022   IR RADIOLOGIST EVAL & MGMT  09/23/2022   IR RADIOLOGIST EVAL & MGMT  11/25/2022   IR RADIOLOGIST EVAL & MGMT  03/22/2023   IR US  GUIDE VASC ACCESS LEFT  04/05/2019   IR US  GUIDE VASC ACCESS LEFT  02/15/2021   IR US  GUIDE VASC ACCESS LEFT  12/14/2021   IR US  GUIDE VASC ACCESS RIGHT  02/20/2018   IR US  GUIDE VASC ACCESS RIGHT  05/14/2019   IR US  GUIDE VASC ACCESS RIGHT  05/07/2020   IR US  GUIDE VASC ACCESS RIGHT  10/13/2022   LOWER EXTREMITY ANGIOGRAPHY N/A 03/15/2024   Procedure: Lower Extremity Angiography;  Surgeon: Magda Debby SAILOR, MD;  Location: [facility] INVASIVE CV LAB;  Service: Cardiovascular;  Laterality: N/A;   LOWER EXTREMITY INTERVENTION N/A 03/15/2024   Procedure: LOWER EXTREMITY INTERVENTION;  Surgeon: Magda Debby SAILOR, MD;  Location: [facility] INVASIVE CV LAB;  Service: Cardiovascular;   Laterality: N/A;   NECK SURGERY     PATCH ANGIOPLASTY Right 06/15/2023   Procedure: PATCH ANGIOPLASTY USING [name] LINTS;  Surgeon: Magda Debby SAILOR, MD;  Location: [facility] OR;  Service: Vascular;  Laterality: Right;   PERIPHERAL VASCULAR BALLOON ANGIOPLASTY  07/21/2023   Procedure: PERIPHERAL VASCULAR BALLOON ANGIOPLASTY;  Surgeon: Magda Debby SAILOR, MD;  Location: [facility] INVASIVE CV LAB;  Service: Cardiovascular;;   PERIPHERAL VASCULAR INTERVENTION  07/21/2023   Procedure: PERIPHERAL VASCULAR INTERVENTION;  Surgeon: Magda Debby SAILOR, MD;  Location: [facility] INVASIVE CV LAB;  Service: Cardiovascular;;   VASECTOMY      Physical Exam   Triage Vital Signs: ED Triage Vitals  Encounter Vitals Group     BP 05/16/24 1147 114/71     Girls Systolic BP Percentile --  Girls Diastolic BP Percentile --      Boys Systolic BP Percentile --      Boys Diastolic BP Percentile --      Pulse Rate 05/16/24 1147 79     Resp 05/16/24 1147 15     Temp 05/16/24 1152 (!) 96.7 F (35.9 C)     Temp Source 05/16/24 1152 Axillary     SpO2 05/16/24 1147 97 %     Weight 05/16/24 1151 125 lb 6 oz (56.9 kg)     Height 05/16/24 1151 5' 1 (1.549 m)     Head Circumference --      Peak Flow --      Pain Score 05/16/24 1148 0     Pain Loc --      Pain Education --      Exclude from Growth Chart --     Most recent vital signs: Vitals:   05/16/24 1500 05/16/24 1507  BP: 133/66 133/66  Pulse: (!) 55 (!) 55  Resp:  15  Temp:  97.7 F (36.5 C)  SpO2: 99% 99%    General: Awake, no distress.  CV:  Good peripheral perfusion.  Regular rate rhythm Resp:  Normal effort.  Clear to auscultation bilaterally Abd:  No distention.  Soft nontender Other:  Dry oral mucosa   ED Results / Procedures / Treatments   Labs (all labs ordered are listed, but only abnormal results are displayed) Labs Reviewed  BASIC METABOLIC PANEL WITH GFR - Abnormal; Notable for the following components:      Result Value   Sodium 125 (*)     Chloride 95 (*)    CO2 19 (*)    Glucose, Bld 439 (*)    BUN 84 (*)    Creatinine, Ser 2.72 (*)    GFR, Estimated 23 (*)    All other components within normal limits  CBC - Abnormal; Notable for the following components:   RBC 4.06 (*)    Hemoglobin 11.4 (*)    HCT 33.3 (*)    Platelets 524 (*)    All other components within normal limits  CBG MONITORING, ED - Abnormal; Notable for the following components:   Glucose-Capillary 451 (*)    All other components within normal limits  RESP PANEL BY RT-PCR (RSV, FLU A&B, COVID)  [id number]  URINALYSIS, ROUTINE W REFLEX MICROSCOPIC  CBG MONITORING, ED     EKG Interpreted by me Sinus rhythm rate of 78.  Normal axis and intervals.  Normal QRS ST segments T waves.  1 PVC on the strip.   RADIOLOGY Chest x-ray interpreted by me, faint hazy area in the left lower lung possibly due to pneumonia.  Radiology report pending   PROCEDURES:  Procedures   MEDICATIONS ORDERED IN ED: Medications  sodium chloride  0.9 % bolus 1,000 mL (1,000 mLs Intravenous New Bag/Given 05/16/24 1456)  insulin  aspart (novoLOG ) injection 6 Units (6 Units Intravenous Given 05/16/24 1450)     IMPRESSION / MDM / ASSESSMENT AND PLAN / ED COURSE  I reviewed the triage vital signs and the nursing notes.  DDx: AKI, metabolic acidosis, anemia, COVID, influenza, hyperglycemia, dehydration, pneumonia, UTI  Patient's presentation is most consistent with acute presentation with potential threat to life or bodily function.  Patient presents with fatigue, chills, hyperglycemia.  Outpatient labs yesterday and repeat labs today show AKI, hyponatremia of 125.  Will start IV fluid hydration, IV insulin  for initial glycemic control.  Will need to hospitalize due to the severity  of his symptoms.       FINAL CLINICAL IMPRESSION(S) / ED DIAGNOSES   Final diagnoses:  AKI (acute kidney injury)  Type 2 diabetes mellitus with hyperglycemia, without long-term current use of  insulin  (HCC)  Hyponatremia     Rx / DC Orders   ED Discharge Orders     None        Note:  This document was prepared using Dragon voice recognition software and may include unintentional dictation errors.   Viviann Pastor, MD 05/16/24 1525

## 2024-05-16 NOTE — ED Triage Notes (Signed)
 Patient started feeling unwell on Friday. Chills, fever, and fatigued. Had blood work drawn at PCP yesterday and called to come to ER for hyperglycemia 542 CBG and BUN 79   Patient is diabetic

## 2024-05-16 NOTE — Assessment & Plan Note (Signed)
 Currently treated with Moxifloxacin. Viral panel and CXR are unremarkable form 10/4 from visit with urgent care. A febrile today, no fevers at home since starting antibiotics. WBC elevated to 12 on 10/4. No other obvious source of infection. Continue Moxifloxacin given improvement in respiratory symptoms. ED percautions given if fever return or feeling more fatigued, significant dyspnea, or change in mentation.

## 2024-05-16 NOTE — Inpatient Diabetes Management (Signed)
 Inpatient Diabetes Program Recommendations  AACE/ADA: New Consensus Statement on Inpatient Glycemic Control   Target Ranges:  Prepandial:   less than 140 mg/dL      Peak postprandial:   less than 180 mg/dL (1-2 hours)      Critically ill patients:  140 - 180 mg/dL    Latest Reference Range & Units 05/16/24 14:24  Glucose-Capillary 70 - 99 mg/dL 548 (H)    Latest Reference Range & Units 05/16/24 11:54  Sodium 135 - 145 mmol/L 125 (L)  Potassium 3.5 - 5.1 mmol/L 4.1  Chloride 98 - 111 mmol/L 95 (L)  CO2 22 - 32 mmol/L 19 (L)  Glucose 70 - 99 mg/dL 560 (H)  BUN 8 - 23 mg/dL 84 (H)  Creatinine 9.38 - 1.24 mg/dL 7.27 (H)  Calcium  8.9 - 10.3 mg/dL 8.9  Anion gap 5 - 15  11   Review of Glycemic Control  Diabetes history: DM2  Outpatient Diabetes medications:  Jardicance 10mg  daily  Glipizide  10mg  daily  Januvia  100mg  daily  Metformin  1,000mg  BID  Current orders for Inpatient glycemic control: Novolog  6 units x 1 dose  Inpatient Diabetes Program Recommendations:   If patient remains inpatient, please consider  - Novolog  0-9 units q4hrs.  - Lantus 5 units daily   Thanks,  Lavanda Search, RN, MSN, [facility]  Inpatient Diabetes Coordinator  Pager [phone] (8a-5p)

## 2024-05-17 ENCOUNTER — Telehealth (HOSPITAL_COMMUNITY): Payer: Self-pay | Admitting: Pharmacy Technician

## 2024-05-17 ENCOUNTER — Other Ambulatory Visit (HOSPITAL_COMMUNITY): Payer: Self-pay

## 2024-05-17 DIAGNOSIS — R739 Hyperglycemia, unspecified: Secondary | ICD-10-CM

## 2024-05-17 LAB — PROTIME-INR
INR: 1.3 — ABNORMAL HIGH (ref 0.8–1.2)
Prothrombin Time: 17 s — ABNORMAL HIGH (ref 11.4–15.2)

## 2024-05-17 LAB — GLUCOSE, CAPILLARY
Glucose-Capillary: 146 mg/dL — ABNORMAL HIGH (ref 70–99)
Glucose-Capillary: 165 mg/dL — ABNORMAL HIGH (ref 70–99)
Glucose-Capillary: 188 mg/dL — ABNORMAL HIGH (ref 70–99)
Glucose-Capillary: 235 mg/dL — ABNORMAL HIGH (ref 70–99)

## 2024-05-17 LAB — CBC
HCT: 30 % — ABNORMAL LOW (ref 39.0–52.0)
Hemoglobin: 10.1 g/dL — ABNORMAL LOW (ref 13.0–17.0)
MCH: 27.4 pg (ref 26.0–34.0)
MCHC: 33.7 g/dL (ref 30.0–36.0)
MCV: 81.5 fL (ref 80.0–100.0)
Platelets: 504 K/uL — ABNORMAL HIGH (ref 150–400)
RBC: 3.68 MIL/uL — ABNORMAL LOW (ref 4.22–5.81)
RDW: 14.5 % (ref 11.5–15.5)
WBC: 6 K/uL (ref 4.0–10.5)
nRBC: 0 % (ref 0.0–0.2)

## 2024-05-17 LAB — COMPREHENSIVE METABOLIC PANEL WITH GFR
ALT: 40 U/L (ref 0–44)
AST: 23 U/L (ref 15–41)
Albumin: 2.2 g/dL — ABNORMAL LOW (ref 3.5–5.0)
Alkaline Phosphatase: 35 U/L — ABNORMAL LOW (ref 38–126)
Anion gap: 6 (ref 5–15)
BUN: 77 mg/dL — ABNORMAL HIGH (ref 8–23)
CO2: 21 mmol/L — ABNORMAL LOW (ref 22–32)
Calcium: 8.3 mg/dL — ABNORMAL LOW (ref 8.9–10.3)
Chloride: 107 mmol/L (ref 98–111)
Creatinine, Ser: 2.08 mg/dL — ABNORMAL HIGH (ref 0.61–1.24)
GFR, Estimated: 32 mL/min — ABNORMAL LOW (ref >60)
Glucose, Bld: 189 mg/dL — ABNORMAL HIGH (ref 70–99)
Potassium: 4.4 mmol/L (ref 3.5–5.1)
Sodium: 134 mmol/L — ABNORMAL LOW (ref 135–145)
Total Bilirubin: 0.5 mg/dL (ref 0.0–1.2)
Total Protein: 6.1 g/dL — ABNORMAL LOW (ref 6.5–8.1)

## 2024-05-17 LAB — HEMOGLOBIN A1C
Hgb A1c MFr Bld: 10.1 % — ABNORMAL HIGH (ref 4.8–5.6)
Mean Plasma Glucose: 243.17 mg/dL

## 2024-05-17 MED ORDER — SODIUM CHLORIDE 0.9 % IV SOLN: INTRAVENOUS | Status: DC

## 2024-05-17 MED ORDER — EMPAGLIFLOZIN 10 MG PO TABS
10.0000 mg | ORAL_TABLET | Freq: Every day | ORAL | Status: DC
  Filled 2024-05-17: qty 1

## 2024-05-17 MED ORDER — METFORMIN HCL 500 MG PO TABS
1000.0000 mg | ORAL_TABLET | Freq: Two times a day (BID) | ORAL | Status: DC
  Administered 2024-05-17 – 2024-05-18 (×2): 1000 mg via ORAL
  Filled 2024-05-17 (×2): qty 2

## 2024-05-17 MED ORDER — EMPAGLIFLOZIN 10 MG PO TABS
10.0000 mg | ORAL_TABLET | Freq: Every day | ORAL | Status: DC
  Administered 2024-05-18: 10 mg via ORAL
  Filled 2024-05-17: qty 1

## 2024-05-17 MED ORDER — GLIPIZIDE ER 10 MG PO TB24
10.0000 mg | ORAL_TABLET | Freq: Every day | ORAL | Status: DC
  Administered 2024-05-18: 10 mg via ORAL
  Filled 2024-05-17: qty 1

## 2024-05-17 MED ORDER — INSULIN STARTER KIT- PEN NEEDLES (ENGLISH)
1.0000 | Freq: Once | Status: DC
  Filled 2024-05-17: qty 1

## 2024-05-17 MED ORDER — GLIPIZIDE ER 10 MG PO TB24
10.0000 mg | ORAL_TABLET | Freq: Every day | ORAL | Status: DC
  Filled 2024-05-17: qty 1

## 2024-05-17 NOTE — Inpatient Diabetes Management (Addendum)
 Inpatient Diabetes Program Recommendations  AACE/ADA: New Consensus Statement on Inpatient Glycemic Control   Target Ranges:  Prepandial:   less than 140 mg/dL      Peak postprandial:   less than 180 mg/dL (1-2 hours)      Critically ill patients:  140 - 180 mg/dL    Latest Reference Range & Units 05/16/24 14:24 05/16/24 17:00 05/16/24 18:49 05/16/24 20:41 05/17/24 08:17  Glucose-Capillary 70 - 99 mg/dL 548 (H) 629 (H) 605 (H) 384 (H) 146 (H)    Latest Reference Range & Units 05/15/24 12:00 05/16/24 11:54 05/17/24 05:29  CO2 22 - 32 mmol/L 16 (L) 19 (L) 21 (L)  Glucose 70 - 99 mg/dL 457 (HH) 560 (H) 810 (H)  BUN 8 - 23 mg/dL 79 (HH) 84 (H) 77 (H)  Creatinine 0.61 - 1.24 mg/dL 7.41 (H) 7.27 (H) 7.91 (H)  Anion gap 5 - 15   11 6     Latest Reference Range & Units 02/05/24 10:35  Hemoglobin A1C 4.8 - 5.6 % 8.5 (H)   Review of Glycemic Control  Diabetes history: DM2 Outpatient Diabetes medications: Jardiance  10 mg daily (not taking), Glipizide  10 mg daily, Januvia  100 mg daily, Metformin  1000 mg BID Current orders for Inpatient glycemic control: Lantus 10 units at bedtime, Novolog  0-9 units TID with meals, Novolog  0-5 units QHS  Recommendation:    Outpatient DM: If patient is discharged on insulin , please provide Rx for insulin  pens (insurance covers Lantus [phone]) and Novolog  (#[id number]) pens $0 copay on both) and insulin  pen needles [phone]).  NOTE: Patient admitted with feeling tired AKI, Community Acquired Pneumonia, uncontrolled DM, and pseudohyponatremia. When patient presented to the hospital, lab glucose 439 mg/dl at 88:45 on 89/0/74.  In reviewing chart, patient seen by Dr. Delaine on 05/11/24 and dx with pneumonia and was prescribed Prednisone  10 mg BID x5 days. Patient seen by Dr. Lemon on 05/15/24 and patient was told to stop Prednisone  since he was having hyperglycemia and it was noted patient had ran out of Jardiance  so it was prescribed again.   Addendum 05/17/24@12 :50-Spoke  with patient and wife at bedside.Patient reports that he is taking Januvia  100 mg daily, Glipizide  10 mg daily, and Metformin  1000 mg BID. Patient has not taken Jardiance  in several weeks due to running out and has not picked up refill that was sent in on 05/15/24 by PCP. Patient states he did not really see any difference with glucose when he took the Jardiance  but he had to urinate more frequently.  Discussed that all 4 DM medications he is prescribed work differently to help with DM control. Encouraged patient to get back on Jardiance  as recommended by his PCP. Patient reports he has never taken insulin .  Patient reports that his glucose usually runs in the low 100's mg/dl but since he started getting sick his glucose was going up in the 200's mg/dl at time. Patient reports that prior to getting sick, he would have hypoglycemia 2-3 times a week, usually in the afternoon between lunch and supper.  Patient confirms that he took the Prednisone  10 mg BID when prescribed on 10/4 and took it for 5 days as directed. Once he started taking the Prednisone , his glucose was running in the 300's mg/dl. Discussed that at this point, it is unclear if he will go home on insulin  but I wanted to educate on insulin  pen use just in case he was discharged new to insulin . Discussed insulin  administration, injection sites, importance of rotating injection sites,  insulin  storage, and needle disposal.  Educated patient and spouse on insulin  pen use at home. Reviewed all steps of insulin  pen including attachment of needle, 2-unit air shot, dialing up dose, giving injection, removing needle, disposal of sharps, storage of unused insulin , disposal of insulin  etc. Patient able to provide successful return demonstration. Patient states he would be willing to use insulin  if prescribed at discharge. Explained to patient that once he goes home, if he starts having hypoglycemia again, he needed to contact his PCP for adjustments with DM  medications. Explained that if he does go home on insulin  and has any hypoglycemia, his PCP may need to decrease or stop the insulin . Discussed risk of hypoglycemia with Glipizide  as well and explained that out of his oral DM medications, that particular medication as highest risk of causing hypoglycemia so he may need to discuss with PCP if he has further issues with hypoglycemia on oral DM medications. Patient reports that he plans to follow up with PCP regarding DM.   Thanks, Earnie Gainer, RN, MSN, [facility] Diabetes Coordinator Inpatient Diabetes Program [phone] (Team Pager from 8am to 5pm)

## 2024-05-17 NOTE — Progress Notes (Signed)
  PROGRESS NOTE    Michael Thomas  FMW:[id number] DOB: 06-Jul-1946 DOA: 05/16/2024 PCP: Lemon Raisin, MD  115A/115A-AA  LOS: 1 day   Brief hospital course:   Assessment & Plan: Michael Thomas is a pleasant 78 y.o. male with medical history significant for intermittent claudication in bilateral lower extremities, diabetes, HTN, HLD, PVD with RLE critical limb ischemia, BPH who was brought in when he has labs drawn on 05/15/2024 were abnormal.  There was abnormally high sugar, AKI and concern for DKA and patient was advised to come into ED for evaluation.    AKI --His kidney function normal is less than 2 and on presentation it was 2.7. --cont MIVF   2.  Community-acquired pneumonia --Pt was recently started on Moxi for PNA, symptoms had already improved PTA. --received cefepime, ceftriaxone  and azithro --1 more dose of Moxi to complete the course  Hyperglycemia due to steroid use DKA, ruled out --pt was not on insulin  PTA --last A1c 8.5.  recently added jardiance . --hold long-acting insulin  --resume home Jardiance , glipizide  and metformin  --ACHS and SSI for now  HTN --hold Lisinopril -hydrochlorothiazide  due to AKI   PAD --cont ASA, plavix  and statin  5.  Pseudohyponatremia - Sodium level is 125 with blood sugars of 439 --improved with BG control    DVT prophylaxis: Heparin  SQ Code Status: Full code  Family Communication: wife updated at bedside today Level of care: Telemetry Medical Dispo:   The patient is from: home Anticipated d/c is to: home Anticipated d/c date is: tomorrow   Subjective and Interval History:  Pt reported improved breathing and cough.     Objective: Vitals:   05/16/24 2010 05/17/24 0302 05/17/24 0814 05/17/24 1457  BP:  126/69 128/68 130/74  Pulse:  (!) 48 (!) 50 63  Resp:  16 18 16   Temp: 97.8 F (36.6 C) 97.6 F (36.4 C) 98.6 F (37 C) 98.4 F (36.9 C)  TempSrc: Oral Oral    SpO2:  100% 97% 98%  Weight:      Height:         Intake/Output Summary (Last 24 hours) at 05/17/2024 1905 Last data filed at 05/17/2024 1900 Gross per 24 hour  Intake 1686.13 ml  Output 1900 ml  Net -213.87 ml   Filed Weights   05/16/24 1151  Weight: 56.9 kg    Examination:   Constitutional: NAD, AAOx3 HEENT: conjunctivae and lids normal, EOMI CV: No cyanosis.   RESP: normal respiratory effort, on RA Neuro: II - XII grossly intact.   Psych: Normal mood and affect.  Appropriate judgement and reason   Data Reviewed: I have personally reviewed labs and imaging studies  Time spent: 50 minutes  Ellouise Haber, MD [facility] If 7PM-7AM, please contact night-coverage 05/17/2024, 7:05 PM

## 2024-05-17 NOTE — Discharge Instructions (Signed)
 Out of all your oral diabetes medications, Glipizide  would be the one to have the highest risk of causing hypoglycemia.  If you have any issues at all with hypoglycemia, you need to call your primary care provider for adjustments with diabetes medications.

## 2024-05-17 NOTE — Telephone Encounter (Signed)
 Patient Product/process development scientist completed.    The patient is insured through [employer]/ [employer]. Patient has Medicare and is not eligible for a copay card, but may be able to apply for patient assistance or Medicare RX Payment Plan (Patient Must reach out to their plan, if eligible for payment plan), if available.    Ran test claim for Lantus Pen and the current 30 day co-pay is $0.00.  Ran test claim for Novolog  FlexPen and the current 30 day co-pay is $0.00.  This test claim was processed through [facility]- copay amounts may vary at other pharmacies due to pharmacy/plan contracts, or as the patient moves through the different stages of their insurance plan.     Reyes Sharps, CPHT Pharmacy Technician Patient Advocate Specialist Lead [facility] Patient Advocate Team Direct Number: [phone]  Fax: [phone]

## 2024-05-17 NOTE — Plan of Care (Signed)
  Problem: Coping: Goal: Ability to adjust to condition or change in health will improve Outcome: Progressing   Problem: Health Behavior/Discharge Planning: Goal: Ability to identify and utilize available resources and services will improve Outcome: Progressing   Problem: Metabolic: Goal: Ability to maintain appropriate glucose levels will improve Outcome: Progressing   Problem: Tissue Perfusion: Goal: Adequacy of tissue perfusion will improve Outcome: Progressing   Problem: Clinical Measurements: Goal: Ability to maintain clinical measurements within normal limits will improve Outcome: Progressing   Problem: Activity: Goal: Risk for activity intolerance will decrease Outcome: Progressing   Problem: Pain Managment: Goal: General experience of comfort will improve and/or be controlled Outcome: Progressing   Problem: Safety: Goal: Ability to remain free from injury will improve Outcome: Progressing

## 2024-05-18 DIAGNOSIS — R739 Hyperglycemia, unspecified: Secondary | ICD-10-CM | POA: Diagnosis not present

## 2024-05-18 LAB — CBC
HCT: 30.8 % — ABNORMAL LOW (ref 39.0–52.0)
Hemoglobin: 10.4 g/dL — ABNORMAL LOW (ref 13.0–17.0)
MCH: 28.3 pg (ref 26.0–34.0)
MCHC: 33.8 g/dL (ref 30.0–36.0)
MCV: 83.9 fL (ref 80.0–100.0)
Platelets: 570 K/uL — ABNORMAL HIGH (ref 150–400)
RBC: 3.67 MIL/uL — ABNORMAL LOW (ref 4.22–5.81)
RDW: 14.7 % (ref 11.5–15.5)
WBC: 8.9 K/uL (ref 4.0–10.5)
nRBC: 0.2 % (ref 0.0–0.2)

## 2024-05-18 LAB — BASIC METABOLIC PANEL WITH GFR
Anion gap: 3 — ABNORMAL LOW (ref 5–15)
BUN: 52 mg/dL — ABNORMAL HIGH (ref 8–23)
CO2: 21 mmol/L — ABNORMAL LOW (ref 22–32)
Calcium: 8.1 mg/dL — ABNORMAL LOW (ref 8.9–10.3)
Chloride: 109 mmol/L (ref 98–111)
Creatinine, Ser: 1.64 mg/dL — ABNORMAL HIGH (ref 0.61–1.24)
GFR, Estimated: 42 mL/min — ABNORMAL LOW (ref >60)
Glucose, Bld: 207 mg/dL — ABNORMAL HIGH (ref 70–99)
Potassium: 5.1 mmol/L (ref 3.5–5.1)
Sodium: 133 mmol/L — ABNORMAL LOW (ref 135–145)

## 2024-05-18 LAB — LEGIONELLA PNEUMOPHILA SEROGP 1 UR AG: L. pneumophila Serogp 1 Ur Ag: POSITIVE — AB

## 2024-05-18 LAB — MAGNESIUM: Magnesium: 1.9 mg/dL (ref 1.7–2.4)

## 2024-05-18 LAB — GLUCOSE, CAPILLARY: Glucose-Capillary: 229 mg/dL — ABNORMAL HIGH (ref 70–99)

## 2024-05-18 MED ORDER — AMLODIPINE BESYLATE 10 MG PO TABS: 10.0000 mg | ORAL_TABLET | Freq: Every day | ORAL | Status: DC

## 2024-05-18 MED ORDER — AMLODIPINE BESYLATE 10 MG PO TABS: 10.0000 mg | ORAL_TABLET | Freq: Every day | ORAL | 0 refills | Status: DC

## 2024-05-18 NOTE — Plan of Care (Signed)
  Problem: Education: Goal: Ability to describe self-care measures that may prevent or decrease complications (Diabetes Survival Skills Education) will improve Outcome: Adequate for Discharge Goal: Individualized Educational Video(s) Outcome: Adequate for Discharge   Problem: Coping: Goal: Ability to adjust to condition or change in health will improve Outcome: Adequate for Discharge   Problem: Fluid Volume: Goal: Ability to maintain a balanced intake and output will improve Outcome: Adequate for Discharge   Problem: Health Behavior/Discharge Planning: Goal: Ability to identify and utilize available resources and services will improve Outcome: Adequate for Discharge Goal: Ability to manage health-related needs will improve Outcome: Adequate for Discharge   Problem: Metabolic: Goal: Ability to maintain appropriate glucose levels will improve Outcome: Adequate for Discharge   Problem: Nutritional: Goal: Maintenance of adequate nutrition will improve Outcome: Adequate for Discharge Goal: Progress toward achieving an optimal weight will improve Outcome: Adequate for Discharge   Problem: Skin Integrity: Goal: Risk for impaired skin integrity will decrease Outcome: Adequate for Discharge   Problem: Tissue Perfusion: Goal: Adequacy of tissue perfusion will improve Outcome: Adequate for Discharge   Problem: Education: Goal: Knowledge of General Education information will improve Description: Including pain rating scale, medication(s)/side effects and non-pharmacologic comfort measures Outcome: Adequate for Discharge   Problem: Health Behavior/Discharge Planning: Goal: Ability to manage health-related needs will improve Outcome: Adequate for Discharge   Problem: Clinical Measurements: Goal: Ability to maintain clinical measurements within normal limits will improve Outcome: Adequate for Discharge Goal: Will remain free from infection Outcome: Adequate for Discharge Goal:  Diagnostic test results will improve Outcome: Adequate for Discharge Goal: Respiratory complications will improve Outcome: Adequate for Discharge Goal: Cardiovascular complication will be avoided Outcome: Adequate for Discharge   Problem: Activity: Goal: Risk for activity intolerance will decrease Outcome: Adequate for Discharge   Problem: Nutrition: Goal: Adequate nutrition will be maintained Outcome: Adequate for Discharge   Problem: Coping: Goal: Level of anxiety will decrease Outcome: Adequate for Discharge   Problem: Elimination: Goal: Will not experience complications related to bowel motility Outcome: Adequate for Discharge Goal: Will not experience complications related to urinary retention Outcome: Adequate for Discharge   Problem: Pain Managment: Goal: General experience of comfort will improve and/or be controlled Outcome: Adequate for Discharge   Problem: Safety: Goal: Ability to remain free from injury will improve Outcome: Adequate for Discharge   Problem: Skin Integrity: Goal: Risk for impaired skin integrity will decrease Outcome: Adequate for Discharge   Problem: Activity: Goal: Ability to tolerate increased activity will improve Outcome: Adequate for Discharge   Problem: Clinical Measurements: Goal: Ability to maintain a body temperature in the normal range will improve Outcome: Adequate for Discharge   Problem: Respiratory: Goal: Ability to maintain adequate ventilation will improve Outcome: Adequate for Discharge Goal: Ability to maintain a clear airway will improve Outcome: Adequate for Discharge

## 2024-05-18 NOTE — Plan of Care (Signed)
  Problem: Coping: Goal: Ability to adjust to condition or change in health will improve Outcome: Progressing   Problem: Fluid Volume: Goal: Ability to maintain a balanced intake and output will improve Outcome: Progressing   Problem: Tissue Perfusion: Goal: Adequacy of tissue perfusion will improve Outcome: Progressing   Problem: Clinical Measurements: Goal: Ability to maintain clinical measurements within normal limits will improve Outcome: Progressing   Problem: Elimination: Goal: Will not experience complications related to bowel motility Outcome: Progressing

## 2024-05-18 NOTE — Discharge Summary (Signed)
 Physician Discharge Summary   Michael Thomas  male DOB: 10-28-1945  FMW:[id number]  PCP: Lemon Raisin, MD  Admit date: 05/16/2024 Discharge date: 05/18/2024  Admitted From: home Disposition:  home CODE STATUS: Full code  Discharge Instructions     Diet Carb Modified   Complete by: As directed    Discharge instructions   Complete by: As directed    Your A1c is 10.1.  Like we discussed, your high blood sugars are likely due to prednisone .  Please continue taking your home diabetic medications, check your blood sugars before breakfast like you have been doing, and then follow up with your PCP to determine if you need insulin .  You had acute kidney injury from dehydration caused by high blood sugars, but your kidney function is back to your normal after IV fluids.  I have stopped your Lisinopril -hydrochlorothiazide  give your chronic kidney disease and risk of dehydration, and started you on amlodipine instead for blood pressure control. [facility] Course:  For full details, please see H&P, progress notes, consult notes and ancillary notes.  Briefly,  Michael Thomas is a pleasant 78 y.o. male with medical history significant for intermittent claudication in bilateral lower extremities, diabetes, HTN, PVD with RLE critical limb ischemia, who was brought in when he has labs drawn on 05/15/2024 were abnormal.  There was abnormally high sugar, AKI and concern for DKA and patient was advised to come into ED for evaluation.    Hyperglycemia due to steroid use DKA, ruled out --pt was not on insulin  PTA --last A1c 8.5 three months ago.  recently added jardiance  by PCP.  Current A1c 10.1.  Pt was not discharged on insulin  since elevated A1c was possibly due to steroid use.   --resume home Jardiance , glipizide  and metformin  after discharge, and f/u with PCP to determine need for insulin .  AKI CKD 3b --His kidney function normal is less than 2 and on presentation it  was 2.7.  Cr improved with IVF to 1.64 (baseline) prior to discharge.   Recent Community-acquired pneumonia --Pt was recently started on Moxi as outpatient on 05/11/24, already completed a course, with improved symptoms.  --Received cefepime, ceftriaxone  and azithro in the ED, which were not continued.   HTN --Lisinopril -hydrochlorothiazide  held due to AKI and d/c'ed at discharge since pt is prone to dehydration.  Amlodipine 10 mg daily was started instead.   PAD --cont ASA, plavix  and statin   5.  Pseudohyponatremia - Sodium level was 125 with blood sugars of 439 --improved with BG control   Unless noted above, medications under STOP list are ones pt was not taking PTA.  Discharge Diagnoses:  Principal Problem:   CAP (community acquired pneumonia) Active Problems:   Essential hypertension, benign   Hyperlipidemia   BPH (benign prostatic hyperplasia) with detrusor instability/OAB   Type 2 diabetes mellitus with diabetic chronic kidney disease (HCC)   Obstructive apnea   PAD (peripheral artery disease)   AKI (acute kidney injury)   30 Day Unplanned Readmission Risk Score    Flowsheet Row ED to Hosp-Admission (Current) from 05/16/2024 in [facility] 1C MEDICAL TELEMETRY  30 Day Unplanned Readmission Risk Score (%) 25.86 Filed at 05/18/2024 0800    This score is the patient's risk of an unplanned readmission within 30 days of being discharged (0 -100%). The score is based on dignosis, age, lab data, medications, orders, and past utilization.   Low:  0-14.9   Medium: 15-21.9  High: 22-29.9   Extreme: 30 and above         Discharge Instructions:  Allergies as of 05/18/2024       Reactions   Amoxicillin  Other (See Comments)   Fatigue and Myalgia and Rigors x2 attempts        Medication List     STOP taking these medications    lisinopril -hydrochlorothiazide  20-25 MG tablet Commonly known as: ZESTORETIC    moxifloxacin 400 MG  tablet Commonly known as: AVELOX   predniSONE  10 MG tablet Commonly known as: DELTASONE        TAKE these medications    AMBULATORY NON FORMULARY MEDICATION One Touch Ultra 2 glucometer   amLODipine 10 MG tablet Commonly known as: NORVASC Take 1 tablet (10 mg total) by mouth daily.   aspirin  EC 81 MG tablet Take 81 mg by mouth at bedtime.   benzonatate  200 MG capsule Commonly known as: TESSALON  Take 200 mg by mouth 3 (three) times daily as needed.   clobetasol  ointment 0.05 % Commonly known as: TEMOVATE  APPLY TOPICALLY TO AFFECTED AREA(S) TWICE DAILY AS NEEDED   clopidogrel  75 MG tablet Commonly known as: Plavix  Take 1 tablet (75 mg total) by mouth daily.   cyanocobalamin  1000 MCG tablet Take 1 tablet (1,000 mcg total) by mouth daily.   empagliflozin  10 MG Tabs tablet Commonly known as: JARDIANCE  Take 1 tablet (10 mg total) by mouth daily.   fenofibrate  160 MG tablet TAKE 1 TABLET BY MOUTH EVERY DAY   ferrous sulfate  325 (65 FE) MG EC tablet Take 1 tablet (325 mg total) by mouth 3 (three) times daily with meals.   gabapentin  300 MG capsule Commonly known as: NEURONTIN  Take 1 capsule (300 mg total) by mouth at bedtime.   glipiZIDE  10 MG 24 hr tablet Commonly known as: GLUCOTROL  XL TAKE 1 TABLET BY MOUTH EVERY DAY WITH BREAKFAST   Januvia  100 MG tablet Generic drug: sitaGLIPtin  Take 100 mg by mouth at bedtime.   metFORMIN  1000 MG tablet Commonly known as: GLUCOPHAGE  TAKE 1 TABLET (1,000 MG TOTAL) BY MOUTH TWICE A DAY WITH FOOD   omega-3 acid ethyl esters 1 g capsule Commonly known as: LOVAZA  TAKE 2 CAPSULES BY MOUTH TWICE A DAY   OneTouch Delica Plus Lancet33G Misc USE UP TO 3 TIMES DAILY.   OneTouch Verio Reflect w/Device Kit Check fasting blood sugar every morning and 2 hours after largest meal of the day.,   OneTouch Verio test strip Generic drug: glucose blood CHECK FASTING BLOOD SUGAR EVERY MORNING AND 2 HOURS AFTER LARGEST MEAL OF THE  DAY.,   rosuvastatin  20 MG tablet Commonly known as: Crestor  Take 1 tablet (20 mg total) by mouth daily.   solifenacin 5 MG tablet Commonly known as: VESICARE Take 1 tablet (5 mg total) by mouth daily.   tamsulosin  0.4 MG Caps capsule Commonly known as: FLOMAX  Take 1 capsule (0.4 mg total) by mouth 2 (two) times daily.         Follow-up Information     Lemon Raisin, MD Follow up in 1 week(s).   Specialty: Internal Medicine Why: hospital follow up Contact information: [address] [place] [address] [phone]                 Allergies  Allergen Reactions   Amoxicillin  Other (See Comments)    Fatigue and Myalgia and Rigors x2 attempts     The results of significant diagnostics from this hospitalization (including imaging, microbiology, ancillary and laboratory) are  listed below for reference.   Consultations:   Procedures/Studies: DG Chest Portable 1 View Result Date: 05/16/2024 CLINICAL DATA:  Shortness of breath, cough, and weakness. Unwell feeling beginning on Friday. Chills, fever, and fatigue. Hyperglycemia. History of diabetes. EXAM: PORTABLE CHEST 1 VIEW COMPARISON:  12/19/2023 FINDINGS: Heart size and pulmonary vascularity are normal. Bronchiectasis and central peribronchial thickening with suggestion of developing patchy infiltrates in the lung bases. This may represent developing multifocal pneumonia superimposed on to bronchitis or bronchitic changes. No pleural effusion or pneumothorax. Mediastinal contours appear intact. Postoperative changes in the cervical spine. IMPRESSION: 1. Bronchiectasis and bronchitic changes in the lungs, likely chronic. 2. Patchy infiltrates suggested in the lower lungs likely indicating superimposed developing multifocal pneumonia. Electronically Signed   By: Elsie Gravely M.D.   On: 05/16/2024 15:33   VAS US  LOWER EXTREMITY BYPASS GRAFT DUPLEX Result Date: 05/07/2024 LOWER EXTREMITY ARTERIAL DUPLEX  STUDY Patient Name:  Michael Thomas  Date of Exam:   05/07/2024 Medical Rec #: [id number]              Accession #:    [id number] Date of Birth: 07-Aug-1946               Patient Gender: M Patient Age:   53 years Exam Location:  [address] Procedure:      VAS US  LOWER EXTREMITY BYPASS GRAFT DUPLEX Referring Phys: DEBBY ROBERTSON --------------------------------------------------------------------------------  Indications: Claudication, and peripheral artery disease. High Risk Factors: Hypertension, hyperlipidemia, Diabetes, past history of                    smoking.  Vascular Interventions: 07/21/2023 Procedure: 1) Ultrasound guided left common                         femoral artery access 2) Right lower extremity angiogram                         with second order cannulation 3) Right femoropopliteal                         angioplasty and stenting (6x166mm Eluvia, 6x144mm                         Eluvia, 6x31mm Eluvia) 4) Conscious sedation (35                         minutes).                         03/15/2024 Left femoral-pop drug coated angioplasty.  Current ABI:            Right ABI 1.16 Left 1.04 Performing Technologist: Devere Dark RVT  Examination Guidelines: A complete evaluation includes B-mode imaging, spectral Doppler, color Doppler, and power Doppler as needed of all accessible portions of each vessel. Bilateral testing is considered an integral part of a complete examination. Limited examinations for reoccurring indications may be performed as noted.   +-----------+--------+-----+--------+----------+--------+ LEFT       PSV cm/sRatioStenosisWaveform  Comments +-----------+--------+-----+--------+----------+--------+ EIA Distal 158                  triphasic          +-----------+--------+-----+--------+----------+--------+ CFA Prox   134  biphasic           +-----------+--------+-----+--------+----------+--------+ CFA Mid                                    stent    +-----------+--------+-----+--------+----------+--------+ SFA Prox                                  stent    +-----------+--------+-----+--------+----------+--------+ SFA Distal                                stent    +-----------+--------+-----+--------+----------+--------+ POP Prox   65                   biphasic           +-----------+--------+-----+--------+----------+--------+ POP Mid    51                   biphasic           +-----------+--------+-----+--------+----------+--------+ POP Distal 47                   biphasic           +-----------+--------+-----+--------+----------+--------+ TP Trunk   71                   biphasic           +-----------+--------+-----+--------+----------+--------+ ATA Distal 26                   monophasic         +-----------+--------+-----+--------+----------+--------+ PTA Distal 18                   biphasic  dampened +-----------+--------+-----+--------+----------+--------+ PERO Distal90                   biphasic           +-----------+--------+-----+--------+----------+--------+  Left Stent(s): +-------------------+--------+--------+--------+--------+ Femoral to pop sentPSV cm/sStenosisWaveformComments +-------------------+--------+--------+--------+--------+ Prox to Stent      85              biphasic         +-------------------+--------+--------+--------+--------+ Proximal Stent     160             biphasic         +-------------------+--------+--------+--------+--------+ Mid Stent          144             biphasic         +-------------------+--------+--------+--------+--------+ Distal Stent       93              biphasic         +-------------------+--------+--------+--------+--------+ Distal to Stent    90              biphasic         +-------------------+--------+--------+--------+--------+    Summary: Left: Patent stent with no visualized stenosis'  Probable tibial artery occlusive disease.  See table(s) above for measurements and observations. Electronically signed by Debby Robertson on 05/07/2024 at 2:39:36 PM.    Final    VAS US  ABI WITH/WO TBI Result Date: 05/07/2024  LOWER EXTREMITY DOPPLER STUDY Patient Name:  Michael Thomas  Date of Exam:   05/07/2024 Medical Rec #: [id number]  Accession #:    [id number] Date of Birth: May 29, 1946               Patient Gender: M Patient Age:   78 years Exam Location:  [address] Procedure:      VAS US  ABI WITH/WO TBI Referring Phys: --------------------------------------------------------------------------------  Indications: Claudication, and peripheral artery disease. High Risk         Hypertension, hyperlipidemia, Diabetes, past history of Factors:          smoking.  Vascular Interventions: 07/21/2023 Procedure: 1) Ultrasound guided left common                         femoral artery access 2) Right lower extremity angiogram                         with second order cannulation 3) Right femoropopliteal                         angioplasty and stenting (6x164mm Eluvia, 6x151mm                         Eluvia, 6x66mm Eluvia) 4) Conscious sedation (35                         minutes).                         03/15/2024 Left femoral to popliteal drugcoated                         angioplasty. Performing Technologist: Devere Dark RVT  Examination Guidelines: A complete evaluation includes at minimum, Doppler waveform signals and systolic blood pressure reading at the level of bilateral brachial, anterior tibial, and posterior tibial arteries, when vessel segments are accessible. Bilateral testing is considered an integral part of a complete examination. Photoelectric Plethysmograph (PPG) waveforms and toe systolic pressure readings are included as required and additional duplex testing as needed. Limited examinations for reoccurring indications may be performed as noted.  ABI Findings:  +---------+------------------+-----+--------+--------+ Right    Rt Pressure (mmHg)IndexWaveformComment  +---------+------------------+-----+--------+--------+ Brachial 163                                     +---------+------------------+-----+--------+--------+ PTA      161               0.97 biphasic         +---------+------------------+-----+--------+--------+ DP       192               1.16 biphasic         +---------+------------------+-----+--------+--------+ Great Toe145               0.87 Normal           +---------+------------------+-----+--------+--------+ +---------+------------------+-----+---------+-------+ Left     Lt Pressure (mmHg)IndexWaveform Comment +---------+------------------+-----+---------+-------+ Brachial 166                                     +---------+------------------+-----+---------+-------+ PTA      166               1.00 biphasic         +---------+------------------+-----+---------+-------+  DP       173               1.04 triphasic        +---------+------------------+-----+---------+-------+ Great Toe125               0.75 Normal           +---------+------------------+-----+---------+-------+ +-------+-----------+-----------+------------+------------+ ABI/TBIToday's ABIToday's TBIPrevious ABIPrevious TBI +-------+-----------+-----------+------------+------------+ Right  1.16       0.87       1.05        0.71         +-------+-----------+-----------+------------+------------+ Left   1.04       0.75       1.04        0.70         +-------+-----------+-----------+------------+------------+   Summary: Right: Resting right ankle-brachial index is within normal range. The right toe-brachial index is normal.  Left: Resting left ankle-brachial index is within normal range. The left toe-brachial index is normal.  *See table(s) above for measurements and observations.  Electronically signed by Debby Robertson on  05/07/2024 at 2:27:13 PM.    Final       Labs: BNP (last 3 results) No results for input(s): BNP in the last 8760 hours. Basic Metabolic Panel: Recent Labs  Lab 05/15/24 1200 05/16/24 1154 05/17/24 0529 05/18/24 0317  NA 126* 125* 134* 133*  K 4.8 4.1 4.4 5.1  CL 91* 95* 107 109  CO2 16* 19* 21* 21*  GLUCOSE 542* 439* 189* 207*  BUN 79* 84* 77* 52*  CREATININE 2.58* 2.72* 2.08* 1.64*  CALCIUM  8.9 8.9 8.3* 8.1*  MG  --   --   --  1.9   Liver Function Tests: Recent Labs  Lab 05/17/24 0529  AST 23  ALT 40  ALKPHOS 35*  BILITOT 0.5  PROT 6.1*  ALBUMIN  2.2*   No results for input(s): LIPASE, AMYLASE in the last 168 hours. No results for input(s): AMMONIA in the last 168 hours. CBC: Recent Labs  Lab 05/16/24 1154 05/17/24 0529 05/18/24 0317  WBC 7.2 6.0 8.9  HGB 11.4* 10.1* 10.4*  HCT 33.3* 30.0* 30.8*  MCV 82.0 81.5 83.9  PLT 524* 504* 570*   Cardiac Enzymes: No results for input(s): CKTOTAL, CKMB, CKMBINDEX, TROPONINI in the last 168 hours. BNP: Invalid input(s): POCBNP CBG: Recent Labs  Lab 05/17/24 0817 05/17/24 1146 05/17/24 1744 05/17/24 1930 05/18/24 0842  GLUCAP 146* 165* 188* 235* 229*   D-Dimer No results for input(s): DDIMER in the last 72 hours. Hgb A1c Recent Labs    05/17/24 0528  HGBA1C 10.1*   Lipid Profile No results for input(s): CHOL, HDL, LDLCALC, TRIG, CHOLHDL, LDLDIRECT in the last 72 hours. Thyroid  function studies No results for input(s): TSH, T4TOTAL, T3FREE, THYROIDAB in the last 72 hours.  Invalid input(s): FREET3 Anemia work up No results for input(s): VITAMINB12, FOLATE, FERRITIN, TIBC, IRON , RETICCTPCT in the last 72 hours. Urinalysis    Component Value Date/Time   COLORURINE STRAW (A) 05/16/2024 1658   APPEARANCEUR CLEAR (A) 05/16/2024 1658   APPEARANCEUR Clear 04/30/2015 1427   LABSPEC 1.014 05/16/2024 1658   PHURINE 5.0 05/16/2024 1658   GLUCOSEU  >=500 (A) 05/16/2024 1658   HGBUR NEGATIVE 05/16/2024 1658   BILIRUBINUR NEGATIVE 05/16/2024 1658   BILIRUBINUR Negative 04/30/2015 1427   KETONESUR NEGATIVE 05/16/2024 1658   PROTEINUR 30 (A) 05/16/2024 1658   NITRITE NEGATIVE 05/16/2024 1658   LEUKOCYTESUR NEGATIVE 05/16/2024 1658   Sepsis Labs Recent Labs  Lab 05/16/24 1154 05/17/24 0529 05/18/24 0317  WBC 7.2 6.0 8.9   Microbiology Recent Results (from the past 240 hours)  Resp panel by RT-PCR (RSV, Flu A&B, Covid) Anterior Nasal Swab     Status: None   Collection Time: 05/16/24 11:54 AM   Specimen: Anterior Nasal Swab  Result Value Ref Range Status   SARS Coronavirus 2 by RT PCR NEGATIVE NEGATIVE Final    Comment: (NOTE) SARS-CoV-2 target nucleic acids are NOT DETECTED.  The SARS-CoV-2 RNA is generally detectable in upper respiratory specimens during the acute phase of infection. The lowest concentration of SARS-CoV-2 viral copies this assay can detect is 138 copies/mL. A negative result does not preclude SARS-Cov-2 infection and should not be used as the sole basis for treatment or other patient management decisions. A negative result may occur with  improper specimen collection/handling, submission of specimen other than nasopharyngeal swab, presence of viral mutation(s) within the areas targeted by this assay, and inadequate number of viral copies(<138 copies/mL). A negative result must be combined with clinical observations, patient history, and epidemiological information. The expected result is Negative.  Fact Sheet for Patients:  BloggerCourse.com  Fact Sheet for Healthcare Providers:  SeriousBroker.it  This test is no t yet approved or cleared by the [place]  FDA and  has been authorized for detection and/or diagnosis of SARS-CoV-2 by FDA under an Emergency Use Authorization (EUA). This EUA will remain  in effect (meaning this test can be used) for the  duration of the COVID-19 declaration under Section 564(b)(1) of the Act, 21 U.S.C.section 360bbb-3(b)(1), unless the authorization is terminated  or revoked sooner.       Influenza A by PCR NEGATIVE NEGATIVE Final   Influenza B by PCR NEGATIVE NEGATIVE Final    Comment: (NOTE) The Xpert Xpress SARS-CoV-2/FLU/RSV plus assay is intended as an aid in the diagnosis of influenza from Nasopharyngeal swab specimens and should not be used as a sole basis for treatment. Nasal washings and aspirates are unacceptable for Xpert Xpress SARS-CoV-2/FLU/RSV testing.  Fact Sheet for Patients: BloggerCourse.com  Fact Sheet for Healthcare Providers: SeriousBroker.it  This test is not yet approved or cleared by the [place]  FDA and has been authorized for detection and/or diagnosis of SARS-CoV-2 by FDA under an Emergency Use Authorization (EUA). This EUA will remain in effect (meaning this test can be used) for the duration of the COVID-19 declaration under Section 564(b)(1) of the Act, 21 U.S.C. section 360bbb-3(b)(1), unless the authorization is terminated or revoked.     Resp Syncytial Virus by PCR NEGATIVE NEGATIVE Final    Comment: (NOTE) Fact Sheet for Patients: BloggerCourse.com  Fact Sheet for Healthcare Providers: SeriousBroker.it  This test is not yet approved or cleared by the [place]  FDA and has been authorized for detection and/or diagnosis of SARS-CoV-2 by FDA under an Emergency Use Authorization (EUA). This EUA will remain in effect (meaning this test can be used) for the duration of the COVID-19 declaration under Section 564(b)(1) of the Act, 21 U.S.C. section 360bbb-3(b)(1), unless the authorization is terminated or revoked.  Performed at [facility], [address] [place], [place] [address]      Total time spend on discharging this patient,  including the last patient exam, discussing the hospital stay, instructions for ongoing care as it relates to all pertinent caregivers, as well as preparing the medical discharge records, prescriptions, and/or referrals as applicable, is 40 minutes.    Ellouise Haber, MD  [facility] 05/18/2024, 10:25 AM

## 2024-05-22 ENCOUNTER — Encounter: Payer: Self-pay | Admitting: Student

## 2024-05-22 ENCOUNTER — Ambulatory Visit (INDEPENDENT_AMBULATORY_CARE_PROVIDER_SITE_OTHER): Admitting: Student

## 2024-05-22 VITALS — BP 110/70 | HR 76 | Ht 61.0 in | Wt 127.0 lb

## 2024-05-22 DIAGNOSIS — E1122 Type 2 diabetes mellitus with diabetic chronic kidney disease: Secondary | ICD-10-CM | POA: Diagnosis not present

## 2024-05-22 DIAGNOSIS — I10 Essential (primary) hypertension: Secondary | ICD-10-CM

## 2024-05-22 DIAGNOSIS — A481 Legionnaires' disease: Secondary | ICD-10-CM | POA: Diagnosis not present

## 2024-05-22 DIAGNOSIS — N1832 Chronic kidney disease, stage 3b: Secondary | ICD-10-CM | POA: Diagnosis not present

## 2024-05-22 DIAGNOSIS — N179 Acute kidney failure, unspecified: Secondary | ICD-10-CM | POA: Diagnosis not present

## 2024-05-22 DIAGNOSIS — Z7984 Long term (current) use of oral hypoglycemic drugs: Secondary | ICD-10-CM

## 2024-05-22 MED ORDER — LOSARTAN POTASSIUM 25 MG PO TABS: 25.0000 mg | ORAL_TABLET | Freq: Every day | ORAL | 1 refills | Status: DC

## 2024-05-22 NOTE — Progress Notes (Signed)
 Established Patient Office Visit  Subjective   Patient ID: Michael Thomas, male    DOB: 16-Sep-1945  Age: 78 y.o. MRN: [id number]  Chief Complaint  Patient presents with   Hospitalization Follow-up    Michael Thomas is a 78 y.o. with medical hx listed below presents today for follow up of AKI, legionella pneumonia, and hyperglycemia.  Recently admitted between 10/9-10/11. Has complete antibiotics. Feel glucoses at home are more controlled. Overall feeling less tired.   Patient Active Problem List   Diagnosis Date Noted   Legionella pneumonia (HCC) 05/24/2024   CAP (community acquired pneumonia) 05/16/2024   AKI (acute kidney injury) 05/16/2024   Rotator cuff tear, left 02/05/2024   Degeneration of intervertebral disc of lumbar region with discogenic back pain 08/31/2023   Symptomatic anemia 08/03/2023   Lumbar facet arthropathy 11/28/2022   Multiple acquired cysts of kidney 10/04/2022   Steatosis of liver 10/04/2022   Stage 3b chronic kidney disease (HCC) 04/22/2022   PAD (peripheral artery disease) 04/22/2022   Muscle cramps 08/18/2021   LPRD (laryngopharyngeal reflux disease) 01/22/2019   Perennial allergic rhinitis 10/22/2018   Primary osteoarthritis of both knees 07/25/2018   Annual physical exam 12/25/2017   Left calf claudication, SFA arterial disease 09/19/2017   Prostate cancer screening 05/03/2016   BPH (benign prostatic hyperplasia) with detrusor instability/OAB 03/25/2015   Type 2 diabetes mellitus with diabetic chronic kidney disease (HCC) 01/24/2014   Obstructive apnea 01/24/2014   Essential hypertension, benign 02/01/2013   Hyperlipidemia 02/01/2013      ROS Refer to HPI    Objective:     Outpatient Encounter Medications as of 05/22/2024  Medication Sig   AMBULATORY NON FORMULARY MEDICATION One Touch Ultra 2 glucometer   aspirin  EC 81 MG tablet Take 81 mg by mouth at bedtime.   Blood Glucose Monitoring Suppl (ONETOUCH VERIO REFLECT)  w/Device KIT Check fasting blood sugar every morning and 2 hours after largest meal of the day.,   clobetasol  ointment (TEMOVATE ) 0.05 % APPLY TOPICALLY TO AFFECTED AREA(S) TWICE DAILY AS NEEDED   clopidogrel  (PLAVIX ) 75 MG tablet Take 1 tablet (75 mg total) by mouth daily.   cyanocobalamin  1000 MCG tablet Take 1 tablet (1,000 mcg total) by mouth daily.   empagliflozin  (JARDIANCE ) 10 MG TABS tablet Take 1 tablet (10 mg total) by mouth daily.   fenofibrate  160 MG tablet TAKE 1 TABLET BY MOUTH EVERY DAY   ferrous sulfate  325 (65 FE) MG EC tablet Take 1 tablet (325 mg total) by mouth 3 (three) times daily with meals.   gabapentin  (NEURONTIN ) 300 MG capsule Take 1 capsule (300 mg total) by mouth at bedtime.   glipiZIDE  (GLUCOTROL  XL) 10 MG 24 hr tablet TAKE 1 TABLET BY MOUTH EVERY DAY WITH BREAKFAST   JANUVIA  100 MG tablet Take 100 mg by mouth at bedtime.   Lancets (ONETOUCH DELICA PLUS LANCET33G) MISC USE UP TO 3 TIMES DAILY.   losartan (COZAAR) 25 MG tablet Take 1 tablet (25 mg total) by mouth daily.   metFORMIN  (GLUCOPHAGE ) 1000 MG tablet TAKE 1 TABLET (1,000 MG TOTAL) BY MOUTH TWICE A DAY WITH FOOD   omega-3 acid ethyl esters (LOVAZA ) 1 g capsule TAKE 2 CAPSULES BY MOUTH TWICE A DAY   ONETOUCH VERIO test strip CHECK FASTING BLOOD SUGAR EVERY MORNING AND 2 HOURS AFTER LARGEST MEAL OF THE DAY.,   rosuvastatin  (CRESTOR ) 20 MG tablet Take 1 tablet (20 mg total) by mouth daily.   solifenacin (VESICARE) 5 MG tablet  Take 1 tablet (5 mg total) by mouth daily.   tamsulosin  (FLOMAX ) 0.4 MG CAPS capsule Take 1 capsule (0.4 mg total) by mouth 2 (two) times daily.   [DISCONTINUED] amLODipine (NORVASC) 10 MG tablet Take 1 tablet (10 mg total) by mouth daily.   No facility-administered encounter medications on file as of 05/22/2024.    BP 110/70   Pulse 76   Ht 5' 1 (1.549 m)   Wt 127 lb (57.6 kg)   SpO2 97%   BMI 24.00 kg/m  BP Readings from Last 3 Encounters:  05/22/24 110/70  05/18/24 (!) 149/65   05/15/24 112/64    Physical Exam Constitutional:      Comments: Chronically ill appearing  HENT:     Mouth/Throat:     Mouth: Mucous membranes are moist.     Pharynx: Oropharynx is clear.  Eyes:     Extraocular Movements: Extraocular movements intact.     Conjunctiva/sclera: Conjunctivae normal.     Pupils: Pupils are equal, round, and reactive to light.  Cardiovascular:     Rate and Rhythm: Normal rate and regular rhythm.  Pulmonary:     Effort: Pulmonary effort is normal. No respiratory distress.     Breath sounds: Normal breath sounds. No rhonchi or rales.  Abdominal:     General: Abdomen is flat. Bowel sounds are normal. There is no distension.     Palpations: Abdomen is soft.     Tenderness: There is no abdominal tenderness.  Musculoskeletal:        General: Normal range of motion.     Right lower leg: No edema.     Left lower leg: No edema.  Skin:    General: Skin is warm and dry.     Capillary Refill: Capillary refill takes less than 2 seconds.  Neurological:     General: No focal deficit present.     Mental Status: He is alert and oriented to person, place, and time.  Psychiatric:        Mood and Affect: Mood normal.        Behavior: Behavior normal.        04/25/2024   10:22 AM 08/31/2023   12:52 PM 06/01/2023    9:15 AM  Depression screen PHQ 2/9  Decreased Interest 0 0 0  Down, Depressed, Hopeless 0 0 0  PHQ - 2 Score 0 0 0  Altered sleeping 0    Tired, decreased energy 0    Change in appetite 0    Feeling bad or failure about yourself  0    Trouble concentrating 0    Moving slowly or fidgety/restless 0    Suicidal thoughts 0    PHQ-9 Score 0    Difficult doing work/chores Not difficult at all         04/25/2024   10:22 AM  GAD 7 : Generalized Anxiety Score  Nervous, Anxious, on Edge 0  Control/stop worrying 0  Worry too much - different things 0  Trouble relaxing 0  Restless 0  Easily annoyed or irritable 0  Afraid - awful might happen 0   Total GAD 7 Score 0  Anxiety Difficulty Not difficult at all    No results found for any visits on 05/22/24.  Last CBC Lab Results  Component Value Date   WBC 8.9 05/18/2024   HGB 10.4 (L) 05/18/2024   HCT 30.8 (L) 05/18/2024   MCV 83.9 05/18/2024   MCH 28.3 05/18/2024   RDW 14.7 05/18/2024  PLT 570 (H) 05/18/2024   Last metabolic panel Lab Results  Component Value Date   GLUCOSE 207 (H) 05/18/2024   NA 133 (L) 05/18/2024   K 5.1 05/18/2024   CL 109 05/18/2024   CO2 21 (L) 05/18/2024   BUN 52 (H) 05/18/2024   CREATININE 1.64 (H) 05/18/2024   GFRNONAA 42 (L) 05/18/2024   CALCIUM  8.1 (L) 05/18/2024   PHOS 3.7 08/08/2023   PROT 6.1 (L) 05/17/2024   ALBUMIN  2.2 (L) 05/17/2024   LABGLOB 2.7 02/05/2024   BILITOT 0.5 05/17/2024   ALKPHOS 35 (L) 05/17/2024   AST 23 05/17/2024   ALT 40 05/17/2024   ANIONGAP 3 (L) 05/18/2024   Last lipids Lab Results  Component Value Date   CHOL 155 02/05/2024   HDL 28 (L) 02/05/2024   LDLCALC 89 02/05/2024   LDLDIRECT 58 07/25/2018   TRIG 221 (H) 02/05/2024   CHOLHDL 5.5 (H) 02/05/2024   Last hemoglobin A1c Lab Results  Component Value Date   HGBA1C 10.1 (H) 05/17/2024   Last thyroid  functions Lab Results  Component Value Date   TSH 0.849 02/05/2024      The ASCVD Risk score (Arnett DK, et al., 2019) failed to calculate for the following reasons:   The 2019 ASCVD risk score is only valid for ages 45 to 56    Assessment & Plan:  Type 2 diabetes mellitus with stage 3b chronic kidney disease, without long-term current use of insulin  (HCC) Assessment & Plan: Reports fasting CBG are between 108-120 since recent discharge. No DKA did have hyperglycemia and hyponatremia likely secondary to corticosteroid use.  Currently taking metformin , januvia , and glizipide, Denies glucoses <70. Will have him restart jardiance  10 mg daily.    Essential hypertension, benign Assessment & Plan: Was taken of lisinopril -hydrochlorothiazide   due to concern for dehydration during recent admission. Was discharged on amlodipine but he has not picked it up. He is normotensive today. Will start on low dose losartan 25 mg daily given proteinuria. Stop amlodipine. He will keep home pressure log. Follow up in 2 weeks.     AKI (acute kidney injury) Assessment & Plan: Likely secondary to dehydration from hyperglycemia. Creatinine back to baseline on 10/11 with IVF. Encourage PO hydration. Will repeat BMP at next visit.     Legionella pneumonia [facility]) Assessment & Plan: Complete antibiotics for this. Afebrile and normal lung exam today.    Other orders -     Losartan Potassium; Take 1 tablet (25 mg total) by mouth daily.  Dispense: 30 tablet; Refill: 1     Return in about 2 weeks (around 06/05/2024) for HTN.    Harlene Saddler, MD

## 2024-05-24 DIAGNOSIS — A481 Legionnaires' disease: Secondary | ICD-10-CM | POA: Insufficient documentation

## 2024-05-24 NOTE — Assessment & Plan Note (Signed)
 Likely secondary to dehydration from hyperglycemia. Creatinine back to baseline on 10/11 with IVF. Encourage PO hydration. Will repeat BMP at next visit.

## 2024-05-24 NOTE — Assessment & Plan Note (Signed)
 Complete antibiotics for this. Afebrile and normal lung exam today.

## 2024-05-24 NOTE — Assessment & Plan Note (Signed)
 Reports fasting CBG are between 108-120 since recent discharge. No DKA did have hyperglycemia and hyponatremia likely secondary to corticosteroid use.  Currently taking metformin , januvia , and glizipide, Denies glucoses <70. Will have him restart jardiance  10 mg daily.

## 2024-05-24 NOTE — Assessment & Plan Note (Signed)
 Was taken of lisinopril -hydrochlorothiazide  due to concern for dehydration during recent admission. Was discharged on amlodipine but he has not picked it up. He is normotensive today. Will start on low dose losartan 25 mg daily given proteinuria. Stop amlodipine. He will keep home pressure log. Follow up in 2 weeks.

## 2024-06-11 DIAGNOSIS — N16 Renal tubulo-interstitial disorders in diseases classified elsewhere: Secondary | ICD-10-CM | POA: Diagnosis not present

## 2024-06-11 DIAGNOSIS — N400 Enlarged prostate without lower urinary tract symptoms: Secondary | ICD-10-CM | POA: Diagnosis not present

## 2024-06-11 DIAGNOSIS — R809 Proteinuria, unspecified: Secondary | ICD-10-CM | POA: Diagnosis not present

## 2024-06-11 DIAGNOSIS — E1122 Type 2 diabetes mellitus with diabetic chronic kidney disease: Secondary | ICD-10-CM | POA: Diagnosis not present

## 2024-06-11 DIAGNOSIS — N281 Cyst of kidney, acquired: Secondary | ICD-10-CM | POA: Diagnosis not present

## 2024-06-11 DIAGNOSIS — K760 Fatty (change of) liver, not elsewhere classified: Secondary | ICD-10-CM | POA: Diagnosis not present

## 2024-06-11 DIAGNOSIS — I129 Hypertensive chronic kidney disease with stage 1 through stage 4 chronic kidney disease, or unspecified chronic kidney disease: Secondary | ICD-10-CM | POA: Diagnosis not present

## 2024-06-11 DIAGNOSIS — I10 Essential (primary) hypertension: Secondary | ICD-10-CM | POA: Diagnosis not present

## 2024-06-11 DIAGNOSIS — D631 Anemia in chronic kidney disease: Secondary | ICD-10-CM | POA: Diagnosis not present

## 2024-06-11 DIAGNOSIS — N1832 Chronic kidney disease, stage 3b: Secondary | ICD-10-CM | POA: Diagnosis not present

## 2024-06-11 DIAGNOSIS — E785 Hyperlipidemia, unspecified: Secondary | ICD-10-CM | POA: Diagnosis not present

## 2024-06-12 ENCOUNTER — Other Ambulatory Visit: Payer: Self-pay | Admitting: Student

## 2024-06-12 MED ORDER — JANUVIA 100 MG PO TABS: 100.0000 mg | ORAL_TABLET | Freq: Every day | ORAL | 1 refills | Status: AC

## 2024-06-13 ENCOUNTER — Other Ambulatory Visit: Payer: Self-pay | Admitting: Student

## 2024-06-14 NOTE — Telephone Encounter (Signed)
 Requested Prescriptions  Refused Prescriptions Disp Refills   losartan (COZAAR) 25 MG tablet [Pharmacy Med Name: LOSARTAN POTASSIUM 25 MG TAB] 90 tablet 1    Sig: TAKE 1 TABLET (25 MG TOTAL) BY MOUTH DAILY.     Cardiovascular:  Angiotensin Receptor Blockers Failed - 06/14/2024  3:04 PM      Failed - Cr in normal range and within 180 days    Creat  Date Value Ref Range Status  09/06/2022 1.62 (H) 0.70 - 1.28 mg/dL Final   Creatinine, Ser  Date Value Ref Range Status  05/18/2024 1.64 (H) 0.61 - 1.24 mg/dL Final   Creatinine, Urine  Date Value Ref Range Status  09/06/2022 80 20 - 320 mg/dL Final         Passed - K in normal range and within 180 days    Potassium  Date Value Ref Range Status  05/18/2024 5.1 3.5 - 5.1 mmol/L Final         Passed - Patient is not pregnant      Passed - Last BP in normal range    BP Readings from Last 1 Encounters:  05/22/24 110/70         Passed - Valid encounter within last 6 months    Recent Outpatient Visits           3 weeks ago Type 2 diabetes mellitus with stage 3b chronic kidney disease, without long-term current use of insulin  (HCC)   [place] [facility], Harlene, MD   1 month ago Type 2 diabetes mellitus with stage 3b chronic kidney disease, without long-term current use of insulin  (HCC)   [place] [facility] at [facility], Harlene, MD   1 month ago Type 2 diabetes mellitus with stage 3b chronic kidney disease, without long-term current use of insulin  [facility])   [place] [facility] at [facility], MD   4 months ago ABLA (acute blood loss anemia)   [facility] at [facility], Debby PARAS, MD   5 months ago Muscle cramps   [facility] at [facility], Debby PARAS, MD

## 2024-06-17 ENCOUNTER — Ambulatory Visit: Admitting: Student

## 2024-06-17 ENCOUNTER — Encounter: Payer: Self-pay | Admitting: Student

## 2024-06-17 VITALS — BP 128/76 | HR 75 | Ht 61.0 in | Wt 127.0 lb

## 2024-06-17 DIAGNOSIS — Z7984 Long term (current) use of oral hypoglycemic drugs: Secondary | ICD-10-CM | POA: Diagnosis not present

## 2024-06-17 DIAGNOSIS — N1832 Chronic kidney disease, stage 3b: Secondary | ICD-10-CM | POA: Diagnosis not present

## 2024-06-17 DIAGNOSIS — I10 Essential (primary) hypertension: Secondary | ICD-10-CM | POA: Diagnosis not present

## 2024-06-17 DIAGNOSIS — E1122 Type 2 diabetes mellitus with diabetic chronic kidney disease: Secondary | ICD-10-CM | POA: Diagnosis not present

## 2024-06-17 MED ORDER — LOSARTAN POTASSIUM 25 MG PO TABS: 25.0000 mg | ORAL_TABLET | Freq: Every day | ORAL | 1 refills | Status: AC

## 2024-06-17 NOTE — Assessment & Plan Note (Addendum)
 Fasting glucoses vary between 79-160 since last vist ist, typically around 90-120. Denies hypoglycemic events. Has many questions regarding diet, referral made to RD. No urinary sx since starting jardiance  -Continue metformin , glipizide  10 mg daily, januvia , and Jardiance  10 mg daily.

## 2024-06-24 NOTE — Assessment & Plan Note (Addendum)
 Normotensive today, stable RFP with nephrology on 11/4. Did bring cuff today and was giving giving variable results, discussed obtain a new cuff. Continue current medication.

## 2024-06-24 NOTE — Progress Notes (Signed)
 Established Patient Office Visit  Subjective   Patient ID: Michael Thomas, male    DOB: November 22, 1945  Age: 78 y.o. MRN: [id number]  Chief Complaint  Patient presents with   Diabetes    Michael Thomas is a 78 y.o. person with medical hx listed below who presents today for diabetes and HTN follow up. Patient interviewed with wife present.   Patient Active Problem List   Diagnosis Date Noted   Legionella pneumonia (HCC) 05/24/2024   AKI (acute kidney injury) 05/16/2024   Rotator cuff tear, left 02/05/2024   Degeneration of intervertebral disc of lumbar region with discogenic back pain 08/31/2023   Symptomatic anemia 08/03/2023   Lumbar facet arthropathy 11/28/2022   Multiple acquired cysts of kidney 10/04/2022   Steatosis of liver 10/04/2022   Stage 3b chronic kidney disease (HCC) 04/22/2022   PAD (peripheral artery disease) 04/22/2022   Muscle cramps 08/18/2021   LPRD (laryngopharyngeal reflux disease) 01/22/2019   Perennial allergic rhinitis 10/22/2018   Primary osteoarthritis of both knees 07/25/2018   Annual physical exam 12/25/2017   Left calf claudication, SFA arterial disease 09/19/2017   Prostate cancer screening 05/03/2016   BPH (benign prostatic hyperplasia) with detrusor instability/OAB 03/25/2015   Type 2 diabetes mellitus with diabetic chronic kidney disease (HCC) 01/24/2014   Obstructive apnea 01/24/2014   Essential hypertension, benign 02/01/2013   Hyperlipidemia 02/01/2013      ROS Refer to HPI    Objective:     Outpatient Encounter Medications as of 06/17/2024  Medication Sig   AMBULATORY NON FORMULARY MEDICATION One Touch Ultra 2 glucometer   aspirin  EC 81 MG tablet Take 81 mg by mouth at bedtime.   Blood Glucose Monitoring Suppl (ONETOUCH VERIO REFLECT) w/Device KIT Check fasting blood sugar every morning and 2 hours after largest meal of the day.,   clobetasol  ointment (TEMOVATE ) 0.05 % APPLY TOPICALLY TO AFFECTED AREA(S) TWICE DAILY  AS NEEDED   clopidogrel  (PLAVIX ) 75 MG tablet Take 1 tablet (75 mg total) by mouth daily.   cyanocobalamin  1000 MCG tablet Take 1 tablet (1,000 mcg total) by mouth daily.   empagliflozin  (JARDIANCE ) 10 MG TABS tablet Take 1 tablet (10 mg total) by mouth daily.   fenofibrate  160 MG tablet TAKE 1 TABLET BY MOUTH EVERY DAY   ferrous sulfate  325 (65 FE) MG EC tablet Take 1 tablet (325 mg total) by mouth 3 (three) times daily with meals.   gabapentin  (NEURONTIN ) 300 MG capsule Take 1 capsule (300 mg total) by mouth at bedtime.   glipiZIDE  (GLUCOTROL  XL) 10 MG 24 hr tablet TAKE 1 TABLET BY MOUTH EVERY DAY WITH BREAKFAST   JANUVIA  100 MG tablet Take 1 tablet (100 mg total) by mouth at bedtime.   Lancets (ONETOUCH DELICA PLUS LANCET33G) MISC USE UP TO 3 TIMES DAILY.   metFORMIN  (GLUCOPHAGE ) 1000 MG tablet TAKE 1 TABLET (1,000 MG TOTAL) BY MOUTH TWICE A DAY WITH FOOD   omega-3 acid ethyl esters (LOVAZA ) 1 g capsule TAKE 2 CAPSULES BY MOUTH TWICE A DAY   ONETOUCH VERIO test strip CHECK FASTING BLOOD SUGAR EVERY MORNING AND 2 HOURS AFTER LARGEST MEAL OF THE DAY.,   rosuvastatin  (CRESTOR ) 20 MG tablet Take 1 tablet (20 mg total) by mouth daily.   solifenacin (VESICARE) 5 MG tablet Take 1 tablet (5 mg total) by mouth daily.   spironolactone (ALDACTONE) 25 MG tablet Take 25 mg by mouth once.   tamsulosin  (FLOMAX ) 0.4 MG CAPS capsule Take 1 capsule (0.4 mg total)  by mouth 2 (two) times daily.   losartan (COZAAR) 25 MG tablet Take 1 tablet (25 mg total) by mouth daily.   [DISCONTINUED] losartan (COZAAR) 25 MG tablet Take 1 tablet (25 mg total) by mouth daily. (Patient not taking: Reported on 06/17/2024)   No facility-administered encounter medications on file as of 06/17/2024.    BP 128/76   Pulse 75   Ht 5' 1 (1.549 m)   Wt 127 lb (57.6 kg)   SpO2 98%   BMI 24.00 kg/m  BP Readings from Last 3 Encounters:  06/17/24 128/76  05/22/24 110/70  05/18/24 (!) 149/65    Physical Exam Constitutional:       Appearance: Normal appearance.  HENT:     Mouth/Throat:     Mouth: Mucous membranes are moist.     Pharynx: Oropharynx is clear.  Cardiovascular:     Rate and Rhythm: Normal rate and regular rhythm.     Pulses: Normal pulses.  Pulmonary:     Effort: Pulmonary effort is normal.     Breath sounds: No rhonchi or rales.  Abdominal:     General: Abdomen is flat. Bowel sounds are normal. There is no distension.     Palpations: Abdomen is soft.     Tenderness: There is no abdominal tenderness.  Musculoskeletal:        General: Normal range of motion.     Right lower leg: No edema.     Left lower leg: No edema.  Skin:    General: Skin is warm and dry.     Capillary Refill: Capillary refill takes less than 2 seconds.  Neurological:     General: No focal deficit present.     Mental Status: He is alert and oriented to person, place, and time.  Psychiatric:        Mood and Affect: Mood normal.        Behavior: Behavior normal.        04/25/2024   10:22 AM 08/31/2023   12:52 PM 06/01/2023    9:15 AM  Depression screen PHQ 2/9  Decreased Interest 0 0 0  Down, Depressed, Hopeless 0 0 0  PHQ - 2 Score 0 0 0  Altered sleeping 0    Tired, decreased energy 0    Change in appetite 0    Feeling bad or failure about yourself  0    Trouble concentrating 0    Moving slowly or fidgety/restless 0    Suicidal thoughts 0    PHQ-9 Score 0     Difficult doing work/chores Not difficult at all       Data saved with a previous flowsheet row definition       04/25/2024   10:22 AM  GAD 7 : Generalized Anxiety Score  Nervous, Anxious, on Edge 0  Control/stop worrying 0  Worry too much - different things 0  Trouble relaxing 0  Restless 0  Easily annoyed or irritable 0  Afraid - awful might happen 0  Total GAD 7 Score 0  Anxiety Difficulty Not difficult at all    No results found for any visits on 06/17/24.  Last CBC Lab Results  Component Value Date   WBC 8.9 05/18/2024   HGB  10.4 (L) 05/18/2024   HCT 30.8 (L) 05/18/2024   MCV 83.9 05/18/2024   MCH 28.3 05/18/2024   RDW 14.7 05/18/2024   PLT 570 (H) 05/18/2024   Last metabolic panel Lab Results  Component Value Date   GLUCOSE  207 (H) 05/18/2024   NA 133 (L) 05/18/2024   K 5.1 05/18/2024   CL 109 05/18/2024   CO2 21 (L) 05/18/2024   BUN 52 (H) 05/18/2024   CREATININE 1.64 (H) 05/18/2024   GFRNONAA 42 (L) 05/18/2024   CALCIUM  8.1 (L) 05/18/2024   PHOS 3.7 08/08/2023   PROT 6.1 (L) 05/17/2024   ALBUMIN  2.2 (L) 05/17/2024   LABGLOB 2.7 02/05/2024   BILITOT 0.5 05/17/2024   ALKPHOS 35 (L) 05/17/2024   AST 23 05/17/2024   ALT 40 05/17/2024   ANIONGAP 3 (L) 05/18/2024   Last lipids Lab Results  Component Value Date   CHOL 155 02/05/2024   HDL 28 (L) 02/05/2024   LDLCALC 89 02/05/2024   LDLDIRECT 58 07/25/2018   TRIG 221 (H) 02/05/2024   CHOLHDL 5.5 (H) 02/05/2024   Last hemoglobin A1c Lab Results  Component Value Date   HGBA1C 10.1 (H) 05/17/2024      The ASCVD Risk score (Arnett DK, et al., 2019) failed to calculate for the following reasons:   The 2019 ASCVD risk score is only valid for ages 44 to 51    Assessment & Plan:  Type 2 diabetes mellitus with stage 3b chronic kidney disease, without long-term current use of insulin  (HCC) Assessment & Plan: Fasting glucoses vary between 79-160 since last vist ist, typically around 90-120. Denies hypoglycemic events. Has many questions regarding diet, referral made to RD. No urinary sx since starting jardiance  -Continue metformin , glipizide  10 mg daily, januvia , and Jardiance  10 mg daily.   Orders: -     Referral to Nutrition and Diabetes Services  Essential hypertension, benign Assessment & Plan: Normotensive today, stable RFP with nephrology on 11/4. Did bring cuff today and was giving giving variable results, discussed obtain a new cuff. Continue current medication.    Other orders -     Losartan Potassium; Take 1 tablet (25 mg total)  by mouth daily.  Dispense: 90 tablet; Refill: 1     Return in about 2 months (around 08/17/2024) for DM.    Harlene Saddler, MD

## 2024-07-16 ENCOUNTER — Telehealth: Payer: Self-pay | Admitting: Student

## 2024-07-16 NOTE — Telephone Encounter (Signed)
 Copied from CRM [id number]. Topic: Medicare AWV >> Jul 16, 2024  9:29 AM Nathanel DEL wrote: Called LVM 07/16/2024 to sched AWV. Please schedule in office or virtual visit.   Nathanel Paschal; Care Guide Ambulatory Clinical Support [facility] [facility] Direct Dial: [phone]

## 2024-07-22 NOTE — Progress Notes (Unsigned)
°  Start: *** end: ***  Patient is here today ***. Patient would like to learn ***. Patient lives with ***.  *** shopping and cooking. Pt reports eating out *** times weekly.  Pt reports making the following changes including ***.  All Pt's questions were answered during this encounter.    History includes:  *** Medications include:  *** Labs noted:  ***   10.1, glip, met, januvia

## 2024-07-26 ENCOUNTER — Telehealth: Payer: Self-pay | Admitting: Student

## 2024-07-26 NOTE — Telephone Encounter (Signed)
 Called pt told him that we will check A1C at his next appt.  KP

## 2024-07-26 NOTE — Telephone Encounter (Signed)
 Patient ask if you can call him about an A1c. He would like to know if this is something he needs to have done in January or to come in sooner.

## 2024-07-29 ENCOUNTER — Encounter: Attending: Student | Admitting: Dietician

## 2024-07-29 DIAGNOSIS — N1832 Chronic kidney disease, stage 3b: Secondary | ICD-10-CM | POA: Diagnosis not present

## 2024-07-29 DIAGNOSIS — Z713 Dietary counseling and surveillance: Secondary | ICD-10-CM | POA: Insufficient documentation

## 2024-07-29 DIAGNOSIS — E1122 Type 2 diabetes mellitus with diabetic chronic kidney disease: Secondary | ICD-10-CM | POA: Diagnosis not present

## 2024-07-29 NOTE — Patient Instructions (Signed)
 Increase water intake aiming for 64 ounces daily

## 2024-08-15 ENCOUNTER — Other Ambulatory Visit (HOSPITAL_COMMUNITY): Payer: Self-pay

## 2024-08-15 ENCOUNTER — Telehealth: Payer: Self-pay | Admitting: Pharmacy Technician

## 2024-08-15 ENCOUNTER — Other Ambulatory Visit: Payer: Self-pay

## 2024-08-15 MED ORDER — ACCU-CHEK GUIDE ME W/DEVICE KIT: PACK | 0 refills | Status: AC

## 2024-08-15 MED ORDER — ACCU-CHEK SOFTCLIX LANCETS MISC: 12 refills | Status: AC

## 2024-08-15 MED ORDER — ACCU-CHEK GUIDE TEST VI STRP: ORAL_STRIP | 12 refills | Status: AC

## 2024-08-15 NOTE — Telephone Encounter (Signed)
 Pharmacy Patient Advocate Encounter   Received notification from [facility] KEY that prior authorization for OneTouch Verio strips is required/requested.   Insurance verification completed.   The patient is insured through [facility] ADV[employer] ADVANCE.   Per test claim:  Accu-check or Contour is preferred by the insurance.  If suggested medication is appropriate, Please send in a new RX and discontinue this one. If not, please advise as to why it's not appropriate so that we may request a Prior Authorization. Please note, some preferred medications may still require a PA.  If the suggested medications have not been trialed and there are no contraindications to their use, the PA will not be submitted, as it will not be approved.  This is a [employer] formulary change for 2026.  Cmm Key# [id number]

## 2024-08-15 NOTE — Telephone Encounter (Signed)
 Thank you for the update. We have sent in the new prescription for the Accu-chek meter and supplies.

## 2024-08-20 ENCOUNTER — Ambulatory Visit: Admitting: Student

## 2024-09-12 ENCOUNTER — Other Ambulatory Visit: Payer: Self-pay

## 2024-09-12 DIAGNOSIS — N138 Other obstructive and reflux uropathy: Secondary | ICD-10-CM

## 2024-09-12 MED ORDER — TAMSULOSIN HCL 0.4 MG PO CAPS: 0.4000 mg | ORAL_CAPSULE | Freq: Two times a day (BID) | ORAL | 3 refills | Status: AC

## 2024-10-29 ENCOUNTER — Encounter (HOSPITAL_COMMUNITY)

## 2024-10-29 ENCOUNTER — Ambulatory Visit: Admitting: Vascular Surgery

## 2024-10-30 ENCOUNTER — Ambulatory Visit: Admitting: Urology
